# Patient Record
Sex: Female | Born: 1950 | Race: Black or African American | Hispanic: No | Marital: Single | Smoking: Never smoker
Health system: Southern US, Community
[De-identification: ages and names within clinical notes are randomized; demographics above are authoritative.]

## PROBLEM LIST (undated history)

## (undated) DIAGNOSIS — K219 Gastro-esophageal reflux disease without esophagitis: Secondary | ICD-10-CM

## (undated) DIAGNOSIS — I10 Essential (primary) hypertension: Secondary | ICD-10-CM

## (undated) HISTORY — PX: PARTIAL HYSTERECTOMY: SHX80

## (undated) HISTORY — PX: BUNIONECTOMY: SHX129

## (undated) HISTORY — PX: HAND TENDON SURGERY: SHX663

---

## 2000-03-17 ENCOUNTER — Encounter: Payer: Self-pay | Admitting: Emergency Medicine

## 2000-03-17 ENCOUNTER — Emergency Department (HOSPITAL_COMMUNITY): Admission: EM | Admit: 2000-03-17 | Discharge: 2000-03-17 | Payer: Self-pay | Admitting: Emergency Medicine

## 2016-10-16 ENCOUNTER — Ambulatory Visit (HOSPITAL_COMMUNITY)
Admission: EM | Admit: 2016-10-16 | Discharge: 2016-10-16 | Disposition: A | Discharge status: Home / Self Care | Attending: Emergency Medicine | Admitting: Emergency Medicine

## 2016-10-16 ENCOUNTER — Encounter (HOSPITAL_COMMUNITY): Payer: Self-pay | Admitting: Emergency Medicine

## 2016-10-16 DIAGNOSIS — G5793 Unspecified mononeuropathy of bilateral lower limbs: Secondary | ICD-10-CM | POA: Diagnosis not present

## 2016-10-16 DIAGNOSIS — M2140 Flat foot [pes planus] (acquired), unspecified foot: Secondary | ICD-10-CM

## 2016-10-16 HISTORY — DX: Essential (primary) hypertension: I10

## 2016-10-16 MED ORDER — GABAPENTIN 100 MG PO CAPS: 100.0000 mg | ORAL_CAPSULE | Freq: Every day | ORAL | 0 refills | Status: DC

## 2016-10-16 NOTE — Discharge Instructions (Signed)
Lower Extremity Neuropathy can come from: Diabetes Alcoholism Neurologic disorders from compression of spinal nerves in back or chronic demyelinating disorders Hypothyroidism Lymes disease Hepatitis C Nutritional or vitamin Defeciencies Vascular disease

## 2016-10-16 NOTE — ED Triage Notes (Signed)
The patient presented to the [facility] with a complaint of bilateral lower leg pain and numbness and tingling since November 2017. The patient stated that she has been to the [name] in [place] for the same complaint and "got no answers."

## 2016-10-16 NOTE — ED Provider Notes (Signed)
CSN: [id number]     Arrival date & time 10/16/16  1308 History   First MD Initiated Contact with Patient 10/16/16 1336     Chief Complaint  Patient presents with  . Leg Pain   (Consider location/radiation/quality/duration/timing/severity/associated sxs/prior Treatment) Patient is here with c/o numbness and tingling in her LE's.  She states she went to her PCP and c/o this and did blood work but has not heard back.  She does have hx of HTN.  She does drink ETOH daily.  She has arthritis and flat feet.  She states the numbness and discomfort have been present since Nov 2017.     The history is provided by the patient.  Leg Pain  Location:  Leg Time since incident:  3 months Injury: no   Leg location:  L leg, R leg, L lower leg and R lower leg Pain details:    Quality:  Tingling and burning   Radiates to:  Does not radiate   Severity:  Mild   Onset quality:  Sudden   Duration:  3 months   Timing:  Constant   Progression:  Unchanged Chronicity:  New Dislocation: no   Foreign body present:  No foreign bodies Tetanus status:  Unknown Prior injury to area:  No Relieved by:  None tried Worsened by:  Nothing Ineffective treatments:  None tried   Past Medical History:  Diagnosis Date  . Hypertension    Past Surgical History:  Procedure Laterality Date  . BUNIONECTOMY Bilateral   . HAND TENDON SURGERY Bilateral   . PARTIAL HYSTERECTOMY     History reviewed. No pertinent family history. Social History  Substance Use Topics  . Smoking status: Never Smoker  . Smokeless tobacco: Never Used  . Alcohol use Yes   OB History    No data available     Review of Systems  Constitutional: Negative.   HENT: Negative.   Eyes: Negative.   Respiratory: Negative.   Cardiovascular: Negative.   Gastrointestinal: Negative.   Endocrine: Negative.   Genitourinary: Negative.   Musculoskeletal: Negative.   Allergic/Immunologic: Negative.   Neurological: Positive for numbness.   Hematological: Negative.   Psychiatric/Behavioral: Negative.     Allergies  Patient has no known allergies.  Home Medications   Prior to Admission medications   Medication Sig Start Date End Date Taking? Authorizing Provider  amLODipine (NORVASC) 5 MG tablet Take 5 mg by mouth daily.   Yes Historical Provider, MD  lisinopril (PRINIVIL,ZESTRIL) 5 MG tablet Take 5 mg by mouth daily.   Yes Historical Provider, MD  gabapentin (NEURONTIN) 100 MG capsule Take 1 capsule (100 mg total) by mouth at bedtime. 10/16/16   Lysbeth Penner, FNP   Meds Ordered and Administered this Visit  Medications - No data to display  BP 151/91 (BP Location: Right Arm)   Pulse 96   Temp 98.1 F (36.7 C) (Oral)   Resp 18   SpO2 100%  No data found.   Physical Exam  Constitutional: She is oriented to person, place, and time. She appears well-developed and well-nourished.  HENT:  Head: Normocephalic and atraumatic.  Eyes: Conjunctivae and EOM are normal. Pupils are equal, round, and reactive to light.  Neck: Normal range of motion. Neck supple.  Cardiovascular: Normal rate, regular rhythm, normal heart sounds and intact distal pulses.   Pulmonary/Chest: Effort normal and breath sounds normal.  Musculoskeletal:  Bilateral halux valgus and pes plantus.  Neurological: She is alert and oriented to person, place, and  time.  Nursing note and vitals reviewed.   Urgent Care Course     Procedures (including critical care time)  Labs Review Labs Reviewed - No data to display  Imaging Review No results found.   Visual Acuity Review  Right Eye Distance:   Left Eye Distance:   Bilateral Distance:    Right Eye Near:   Left Eye Near:    Bilateral Near:         MDM   1. Neuropathy involving both lower extremities   2. Pes planus, unspecified laterality    Discussed with patient that this could be from metabolic problem, auto immune, vascular, orthopedic, Or idiopathic  Advised to cut  back on ETOH.  Take folic acid and Thiamine vitamins Daily.  Take B12 vitamins  Neurontin 100mg  po qhs #30  Please follow up with PCP.  May need bilateral NCS and referral to Neurology.      Lysbeth Penner, FNP 10/16/16 1436

## 2020-05-20 ENCOUNTER — Other Ambulatory Visit: Payer: Self-pay

## 2020-05-20 ENCOUNTER — Encounter (HOSPITAL_COMMUNITY): Payer: Self-pay

## 2020-05-20 ENCOUNTER — Ambulatory Visit (HOSPITAL_COMMUNITY)
Admission: EM | Admit: 2020-05-20 | Discharge: 2020-05-20 | Disposition: A | Discharge status: Home / Self Care | Payer: Medicare Other | Attending: Emergency Medicine | Admitting: Emergency Medicine

## 2020-05-20 DIAGNOSIS — R609 Edema, unspecified: Secondary | ICD-10-CM | POA: Insufficient documentation

## 2020-05-20 DIAGNOSIS — I10 Essential (primary) hypertension: Secondary | ICD-10-CM | POA: Insufficient documentation

## 2020-05-20 LAB — COMPREHENSIVE METABOLIC PANEL
ALT: 14 U/L (ref 0–44)
AST: 20 U/L (ref 15–41)
Albumin: 4.1 g/dL (ref 3.5–5.0)
Alkaline Phosphatase: 73 U/L (ref 38–126)
Anion gap: 10 (ref 5–15)
BUN: 10 mg/dL (ref 8–23)
CO2: 28 mmol/L (ref 22–32)
Calcium: 9.4 mg/dL (ref 8.9–10.3)
Chloride: 104 mmol/L (ref 98–111)
Creatinine, Ser: 1.03 mg/dL — ABNORMAL HIGH (ref 0.44–1.00)
GFR calc Af Amer: >60 mL/min (ref >60)
GFR calc non Af Amer: 56 mL/min — ABNORMAL LOW (ref >60)
Glucose, Bld: 113 mg/dL — ABNORMAL HIGH (ref 70–99)
Potassium: 3.5 mmol/L (ref 3.5–5.1)
Sodium: 142 mmol/L (ref 135–145)
Total Bilirubin: 0.6 mg/dL (ref 0.3–1.2)
Total Protein: 7.9 g/dL (ref 6.5–8.1)

## 2020-05-20 LAB — CBC
HCT: 37.2 % (ref 36.0–46.0)
Hemoglobin: 12.1 g/dL (ref 12.0–15.0)
MCH: 32.9 pg (ref 26.0–34.0)
MCHC: 32.5 g/dL (ref 30.0–36.0)
MCV: 101.1 fL — ABNORMAL HIGH (ref 80.0–100.0)
Platelets: 265 10*3/uL (ref 150–400)
RBC: 3.68 MIL/uL — ABNORMAL LOW (ref 3.87–5.11)
RDW: 13.4 % (ref 11.5–15.5)
WBC: 5.7 10*3/uL (ref 4.0–10.5)
nRBC: 0 % (ref 0.0–0.2)

## 2020-05-20 MED ORDER — AMLODIPINE BESYLATE 5 MG PO TABS: 5.0000 mg | ORAL_TABLET | Freq: Every day | ORAL | 0 refills | Status: DC

## 2020-05-20 NOTE — Discharge Instructions (Addendum)
Restart you blood pressure medication: amlodipine.  Schedule a follow up visit with your primary care provider next week.    I will call you with your lab work results this afternoon.  Depending on the results, I may prescribe a fluid medication for 3 days.   Follow the low salt diet attached.   Go to the Emergency Department if you have shortness of breath or other concerning symptoms.   Your blood pressure is elevated today at 167/84.  Please have this rechecked by your primary care provider in 1-2 weeks.

## 2020-05-20 NOTE — ED Provider Notes (Signed)
[facility]    CSN: [id number] Arrival date & time: 05/20/20  1002      History   Chief Complaint Chief Complaint  Patient presents with  . Leg Swelling    HPI Brenda Stewart is a 69 y.o. female.   Patient presents with bilateral lower extremity edema x3 to 4 days.  She denies shortness of breath, chest pain, focal weakness, dizziness, palpitations, or other symptoms patient states she has not taken her blood pressure medicine for approximately 9 months because she does not have a refill and has not been able to see her PCP at the [name]. no treatments attempted at home.  The history is provided by the patient.    Past Medical History:  Diagnosis Date  . Hypertension     There are no problems to display for this patient.   Past Surgical History:  Procedure Laterality Date  . BUNIONECTOMY Bilateral   . HAND TENDON SURGERY Bilateral   . PARTIAL HYSTERECTOMY      OB History   No obstetric history on file.      Home Medications    Prior to Admission medications   Medication Sig Start Date End Date Taking? Authorizing Provider  amLODipine (NORVASC) 5 MG tablet Take 1 tablet (5 mg total) by mouth daily. 05/20/20   Sharion Balloon, NP  gabapentin (NEURONTIN) 100 MG capsule Take 1 capsule (100 mg total) by mouth at bedtime. 10/16/16   Lysbeth Penner, FNP  lisinopril (PRINIVIL,ZESTRIL) 5 MG tablet Take 5 mg by mouth daily.    [provider]    Family History History reviewed. No pertinent family history.  Social History Social History   Tobacco Use  . Smoking status: Never Smoker  . Smokeless tobacco: Never Used  Substance Use Topics  . Alcohol use: Yes  . Drug use: No     Allergies   Patient has no known allergies.   Review of Systems Review of Systems  Constitutional: Negative for chills and fever.  HENT: Negative for ear pain and sore throat.   Eyes: Negative for pain and visual disturbance.  Respiratory: Negative for cough and  shortness of breath.   Cardiovascular: Positive for leg swelling. Negative for chest pain and palpitations.  Gastrointestinal: Negative for abdominal pain, diarrhea and vomiting.  Genitourinary: Negative for dysuria and hematuria.  Musculoskeletal: Negative for arthralgias and back pain.  Skin: Negative for color change and rash.  Neurological: Negative for dizziness, seizures, syncope, facial asymmetry, speech difficulty, weakness, numbness and headaches.  All other systems reviewed and are negative.    Physical Exam Triage Vital Signs ED Triage Vitals  Enc Vitals Group     BP      Pulse      Resp      Temp      Temp src      SpO2      Weight      Height      Head Circumference      Peak Flow      Pain Score      Pain Loc      Pain Edu?      Excl. in [place]?    No data found.  Updated Vital Signs BP (!) 167/84   Pulse 92   Temp 99.1 F (37.3 C)   Resp 16   SpO2 98%   Visual Acuity Right Eye Distance:   Left Eye Distance:   Bilateral Distance:    Right  Eye Near:   Left Eye Near:    Bilateral Near:     Physical Exam Vitals and nursing note reviewed.  Constitutional:      General: She is not in acute distress.    Appearance: She is well-developed. She is not ill-appearing.  HENT:     Head: Normocephalic and atraumatic.     Mouth/Throat:     Mouth: Mucous membranes are moist.  Eyes:     Conjunctiva/sclera: Conjunctivae normal.  Cardiovascular:     Rate and Rhythm: Normal rate and regular rhythm.     Heart sounds: Normal heart sounds. No murmur heard.   Pulmonary:     Effort: Pulmonary effort is normal. No respiratory distress.     Breath sounds: Normal breath sounds. No wheezing, rhonchi or rales.  Abdominal:     Palpations: Abdomen is soft.     Tenderness: There is no abdominal tenderness. There is no guarding or rebound.  Musculoskeletal:     Cervical back: Neck supple.     Right lower leg: Edema present.     Left lower leg: Edema present.  Skin:     General: Skin is warm and dry.  Neurological:     General: No focal deficit present.     Mental Status: She is alert and oriented to person, place, and time.     Gait: Gait normal.  Psychiatric:        Mood and Affect: Mood normal.        Behavior: Behavior normal.      UC Treatments / Results  Labs (all labs ordered are listed, but only abnormal results are displayed) Labs Reviewed  CBC - Abnormal; Notable for the following components:      Result Value   RBC 3.68 (*)    MCV 101.1 (*)    All other components within normal limits  COMPREHENSIVE METABOLIC PANEL - Abnormal; Notable for the following components:   Glucose, Bld 113 (*)    Creatinine, Ser 1.03 (*)    GFR calc non Af Amer 56 (*)    All other components within normal limits    EKG   Radiology No results found.  Procedures Procedures (including critical care time)  Medications Ordered in UC Medications - No data to display  Initial Impression / Assessment and Plan / UC Course  I have reviewed the triage vital signs and the nursing notes.  Pertinent labs & imaging results that were available during my care of the patient were reviewed by me and considered in my medical decision making (see chart for details).    Peripheral edema.  Elevated blood pressure with known hypertension.   CBC shows no elevated white count.  CMP shows slightly elevated creatinine and GFR greater than 60.  Restarting amlodipine.  Instructed patient to schedule follow-up with her PCP next week.  Low-sodium diet recommended; education provided.  Instructed patient to go to the ED if she has shortness of breath or other concerning symptoms.  Discussed that her blood pressure is elevated today and needs to be rechecked by her PCP.  Patient agrees to plan of care.  Final Clinical Impressions(s) / UC Diagnoses   Final diagnoses:  Peripheral edema  Elevated blood pressure reading in office with diagnosis of hypertension     Discharge  Instructions     Restart you blood pressure medication: amlodipine.  Schedule a follow up visit with your primary care provider next week.    I will call you with your  lab work results this afternoon.  Depending on the results, I may prescribe a fluid medication for 3 days.   Follow the low salt diet attached.   Go to the Emergency Department if you have shortness of breath or other concerning symptoms.   Your blood pressure is elevated today at 167/84.  Please have this rechecked by your primary care provider in 1-2 weeks.         ED Prescriptions    Medication Sig Dispense Auth. Provider   amLODipine (NORVASC) 5 MG tablet Take 1 tablet (5 mg total) by mouth daily. 30 tablet Sharion Balloon, NP     PDMP not reviewed this encounter.   Sharion Balloon, NP 05/20/20 1159

## 2020-05-20 NOTE — ED Triage Notes (Signed)
Patient reports bilateral lower extremity edema x3-4 days. Also complains of 10/10 throbbing pain in her right great toe.

## 2021-01-02 ENCOUNTER — Other Ambulatory Visit (HOSPITAL_COMMUNITY): Payer: Self-pay

## 2021-02-27 ENCOUNTER — Encounter (HOSPITAL_COMMUNITY): Payer: Self-pay | Admitting: Emergency Medicine

## 2021-02-27 ENCOUNTER — Other Ambulatory Visit: Payer: Self-pay

## 2021-02-27 ENCOUNTER — Ambulatory Visit (HOSPITAL_COMMUNITY)
Admission: EM | Admit: 2021-02-27 | Discharge: 2021-02-27 | Disposition: A | Discharge status: Home / Self Care | Payer: Medicare Other

## 2021-02-27 DIAGNOSIS — R1084 Generalized abdominal pain: Secondary | ICD-10-CM

## 2021-02-27 LAB — POCT URINALYSIS DIPSTICK, ED / UC
Glucose, UA: NEGATIVE mg/dL
Ketones, ur: 15 mg/dL — AB
Leukocytes,Ua: NEGATIVE
Nitrite: NEGATIVE
Protein, ur: NEGATIVE mg/dL
Specific Gravity, Urine: >=1.03 (ref 1.005–1.030)
Urobilinogen, UA: 1 mg/dL (ref 0.0–1.0)
pH: 5.5 (ref 5.0–8.0)

## 2021-02-27 MED ORDER — MINERAL OIL RE ENEM: 1.0000 | ENEMA | Freq: Once | RECTAL | 0 refills | Status: AC

## 2021-02-27 MED ORDER — POLYETHYLENE GLYCOL 3350 17 GM/SCOOP PO POWD: ORAL | 0 refills | Status: DC

## 2021-02-27 NOTE — Discharge Instructions (Addendum)
Urine did not show any infection. Mineral oil suppository today. Start miralax as directed. Keep hydrated, urine should be clear to pale yellow in color. If worsening pain, nausea/vomiting, fever/chills, not passing gas, go to the emergency department for further evaluation.

## 2021-02-27 NOTE — ED Triage Notes (Signed)
Right lower abdominal pain radiating across abdomen to the left.  Pain has been intermittent since the end of April.  Last BM was 3-4 days ago and described stool as "little raisins".  Patient has not taken any stool softener.  Denies urinary symptoms

## 2021-02-27 NOTE — ED Provider Notes (Signed)
[facility]    CSN: [id number] Arrival date & time: 02/27/21  1501      History   Chief Complaint Chief Complaint  Patient presents with  . Abdominal Pain    HPI Brenda Stewart is a 70 y.o. female.   70 year old female comes in for 1 month of intermittent abdominal pain. States pain is to bilateral lower abdomen, and feels "sore". It does not occur every day, and does not have obvious aggravating or alleviating factor. Denies nausea, vomiting, diarrhea. Denies fever, chills, body aches. Denies urinary changes. Has been having constipation with small hard stools, last BM 3-4 days ago. Denies melena, hematochezia. Continues to pass flatus. Unsure last colonoscopy. States was given cologuard to do last year/this year, but due to transportation, has not brought this in to PCP. S/p hysterectomy     Past Medical History:  Diagnosis Date  . Hypertension     There are no problems to display for this patient.   Past Surgical History:  Procedure Laterality Date  . BUNIONECTOMY Bilateral   . HAND TENDON SURGERY Bilateral   . PARTIAL HYSTERECTOMY      OB History   No obstetric history on file.      Home Medications    Prior to Admission medications   Medication Sig Start Date End Date Taking? Authorizing Provider  amLODipine (NORVASC) 5 MG tablet Take 1 tablet (5 mg total) by mouth daily. 05/20/20  Yes Sharion Balloon, NP  aspirin EC 81 MG tablet Take 81 mg by mouth daily. Swallow whole.   Yes [provider]  lisinopril (PRINIVIL,ZESTRIL) 5 MG tablet Take 5 mg by mouth daily.   Yes [provider]  mineral oil enema Place 133 mLs (1 enema total) rectally once for 1 dose. 02/27/21 02/27/21 Yes Adina Puzzo V, PA-C  polyethylene glycol powder (GLYCOLAX/MIRALAX) 17 GM/SCOOP powder One scoop (17g) with 8 oz of water daily for the next 7 days 02/27/21  Yes Monesha Monreal V, PA-C  gabapentin (NEURONTIN) 100 MG capsule Take 1 capsule (100 mg total) by mouth at  bedtime. Patient not taking: Reported on 02/27/2021 10/16/16   Lysbeth Penner, FNP    Family History History reviewed. No pertinent family history.  Social History Social History   Tobacco Use  . Smoking status: Never Smoker  . Smokeless tobacco: Never Used  Vaping Use  . Vaping Use: Never used  Substance Use Topics  . Alcohol use: Yes  . Drug use: No     Allergies   Patient has no known allergies.   Review of Systems Review of Systems  Reason unable to perform ROS: See HPI as above.     Physical Exam Triage Vital Signs ED Triage Vitals [02/27/21 1557]  Enc Vitals Group     BP      Pulse      Resp      Temp      Temp src      SpO2      Weight      Height      Head Circumference      Peak Flow      Pain Score 9     Pain Loc      Pain Edu?      Excl. in [place]?    No data found.  Updated Vital Signs BP 140/78 (BP Location: Right Arm)   Pulse 89   Temp 98 F (36.7 C) (Oral)   Resp  20   SpO2 97%   Physical Exam Constitutional:      General: She is not in acute distress.    Appearance: She is well-developed. She is not ill-appearing, toxic-appearing or diaphoretic.  HENT:     Head: Normocephalic and atraumatic.  Eyes:     Conjunctiva/sclera: Conjunctivae normal.     Pupils: Pupils are equal, round, and reactive to light.  Cardiovascular:     Rate and Rhythm: Normal rate and regular rhythm.  Pulmonary:     Effort: Pulmonary effort is normal. No respiratory distress.     Comments: LCTAB Abdominal:     General: Bowel sounds are normal.     Palpations: Abdomen is soft. There is no mass.     Tenderness: There is abdominal tenderness in the left upper quadrant and left lower quadrant. There is no right CVA tenderness, left CVA tenderness, guarding or rebound.  Musculoskeletal:     Cervical back: Normal range of motion and neck supple.  Skin:    General: Skin is warm and dry.  Neurological:     Mental Status: She is alert and oriented to person,  place, and time.  Psychiatric:        Behavior: Behavior normal.        Judgment: Judgment normal.      UC Treatments / Results  Labs (all labs ordered are listed, but only abnormal results are displayed) Labs Reviewed  POCT URINALYSIS DIPSTICK, ED / UC - Abnormal; Notable for the following components:      Result Value   Bilirubin Urine SMALL (*)    Ketones, ur 15 (*)    Hgb urine dipstick TRACE (*)    All other components within normal limits    EKG   Radiology No results found.  Procedures Procedures (including critical care time)  Medications Ordered in UC Medications - No data to display  Initial Impression / Assessment and Plan / UC Course  I have reviewed the triage vital signs and the nursing notes.  Pertinent labs & imaging results that were available during my care of the patient were reviewed by me and considered in my medical decision making (see chart for details).    Urine dipstick negative for leuks, nitrites. No urinary changes. Abdomen soft, +BS, LUQ/LLQ pain without guarding or rebound. No obvious masses felt. Will trial medicines for constipation first. Push fluids. Discussed low threshold for ED visit. Return precautions given. Patient expresses understanding and agrees to plan.  Final Clinical Impressions(s) / UC Diagnoses   Final diagnoses:  Generalized abdominal pain    ED Prescriptions    Medication Sig Dispense Auth. Provider   polyethylene glycol powder (GLYCOLAX/MIRALAX) 17 GM/SCOOP powder One scoop (17g) with 8 oz of water daily for the next 7 days 255 g Jessel Gettinger V, PA-C   mineral oil enema Place 133 mLs (1 enema total) rectally once for 1 dose. 133 mL Ok Edwards, PA-C     PDMP not reviewed this encounter.   Ok Edwards, PA-C 02/27/21 1635

## 2021-04-21 ENCOUNTER — Inpatient Hospital Stay (HOSPITAL_COMMUNITY): Payer: Medicare Other

## 2021-04-21 ENCOUNTER — Other Ambulatory Visit: Payer: Self-pay

## 2021-04-21 ENCOUNTER — Inpatient Hospital Stay (HOSPITAL_COMMUNITY)
Admission: EM | Admit: 2021-04-21 | Discharge: 2021-05-09 | DRG: 356 | Disposition: A | Discharge status: Home Health | Payer: Medicare Other | Attending: Family Medicine | Admitting: Family Medicine

## 2021-04-21 ENCOUNTER — Emergency Department (HOSPITAL_COMMUNITY): Payer: Medicare Other

## 2021-04-21 ENCOUNTER — Encounter (HOSPITAL_COMMUNITY): Payer: Self-pay | Admitting: Emergency Medicine

## 2021-04-21 DIAGNOSIS — K56600 Partial intestinal obstruction, unspecified as to cause: Secondary | ICD-10-CM | POA: Diagnosis present

## 2021-04-21 DIAGNOSIS — C482 Malignant neoplasm of peritoneum, unspecified: Secondary | ICD-10-CM

## 2021-04-21 DIAGNOSIS — C786 Secondary malignant neoplasm of retroperitoneum and peritoneum: Secondary | ICD-10-CM | POA: Diagnosis present

## 2021-04-21 DIAGNOSIS — R188 Other ascites: Secondary | ICD-10-CM | POA: Diagnosis not present

## 2021-04-21 DIAGNOSIS — Z6829 Body mass index (BMI) 29.0-29.9, adult: Secondary | ICD-10-CM

## 2021-04-21 DIAGNOSIS — D509 Iron deficiency anemia, unspecified: Secondary | ICD-10-CM | POA: Diagnosis present

## 2021-04-21 DIAGNOSIS — Z809 Family history of malignant neoplasm, unspecified: Secondary | ICD-10-CM

## 2021-04-21 DIAGNOSIS — C269 Malignant neoplasm of ill-defined sites within the digestive system: Secondary | ICD-10-CM | POA: Diagnosis present

## 2021-04-21 DIAGNOSIS — R9431 Abnormal electrocardiogram [ECG] [EKG]: Secondary | ICD-10-CM

## 2021-04-21 DIAGNOSIS — I10 Essential (primary) hypertension: Secondary | ICD-10-CM | POA: Diagnosis present

## 2021-04-21 DIAGNOSIS — I809 Phlebitis and thrombophlebitis of unspecified site: Secondary | ICD-10-CM | POA: Diagnosis present

## 2021-04-21 DIAGNOSIS — D630 Anemia in neoplastic disease: Secondary | ICD-10-CM | POA: Diagnosis present

## 2021-04-21 DIAGNOSIS — R2230 Localized swelling, mass and lump, unspecified upper limb: Secondary | ICD-10-CM

## 2021-04-21 DIAGNOSIS — I2699 Other pulmonary embolism without acute cor pulmonale: Secondary | ICD-10-CM | POA: Diagnosis not present

## 2021-04-21 DIAGNOSIS — R180 Malignant ascites: Secondary | ICD-10-CM | POA: Diagnosis present

## 2021-04-21 DIAGNOSIS — Z20822 Contact with and (suspected) exposure to covid-19: Secondary | ICD-10-CM | POA: Diagnosis present

## 2021-04-21 DIAGNOSIS — E43 Unspecified severe protein-calorie malnutrition: Secondary | ICD-10-CM | POA: Insufficient documentation

## 2021-04-21 DIAGNOSIS — Z7982 Long term (current) use of aspirin: Secondary | ICD-10-CM

## 2021-04-21 DIAGNOSIS — I2609 Other pulmonary embolism with acute cor pulmonale: Secondary | ICD-10-CM | POA: Diagnosis not present

## 2021-04-21 DIAGNOSIS — D539 Nutritional anemia, unspecified: Secondary | ICD-10-CM | POA: Diagnosis present

## 2021-04-21 DIAGNOSIS — D72829 Elevated white blood cell count, unspecified: Secondary | ICD-10-CM | POA: Diagnosis present

## 2021-04-21 DIAGNOSIS — R112 Nausea with vomiting, unspecified: Secondary | ICD-10-CM

## 2021-04-21 DIAGNOSIS — R1114 Bilious vomiting: Secondary | ICD-10-CM

## 2021-04-21 DIAGNOSIS — E871 Hypo-osmolality and hyponatremia: Secondary | ICD-10-CM

## 2021-04-21 DIAGNOSIS — N179 Acute kidney failure, unspecified: Secondary | ICD-10-CM

## 2021-04-21 DIAGNOSIS — C7989 Secondary malignant neoplasm of other specified sites: Secondary | ICD-10-CM | POA: Diagnosis present

## 2021-04-21 DIAGNOSIS — D75839 Thrombocytosis, unspecified: Secondary | ICD-10-CM | POA: Diagnosis present

## 2021-04-21 DIAGNOSIS — R634 Abnormal weight loss: Secondary | ICD-10-CM

## 2021-04-21 DIAGNOSIS — E538 Deficiency of other specified B group vitamins: Secondary | ICD-10-CM | POA: Diagnosis present

## 2021-04-21 DIAGNOSIS — E860 Dehydration: Secondary | ICD-10-CM | POA: Diagnosis present

## 2021-04-21 DIAGNOSIS — C800 Disseminated malignant neoplasm, unspecified: Secondary | ICD-10-CM | POA: Diagnosis present

## 2021-04-21 DIAGNOSIS — Z79899 Other long term (current) drug therapy: Secondary | ICD-10-CM

## 2021-04-21 DIAGNOSIS — D490 Neoplasm of unspecified behavior of digestive system: Secondary | ICD-10-CM | POA: Diagnosis not present

## 2021-04-21 DIAGNOSIS — R9389 Abnormal findings on diagnostic imaging of other specified body structures: Secondary | ICD-10-CM

## 2021-04-21 DIAGNOSIS — Z0189 Encounter for other specified special examinations: Secondary | ICD-10-CM

## 2021-04-21 DIAGNOSIS — I700 Atherosclerosis of aorta: Secondary | ICD-10-CM | POA: Diagnosis present

## 2021-04-21 DIAGNOSIS — K56609 Unspecified intestinal obstruction, unspecified as to partial versus complete obstruction: Secondary | ICD-10-CM | POA: Diagnosis not present

## 2021-04-21 DIAGNOSIS — F1010 Alcohol abuse, uncomplicated: Secondary | ICD-10-CM | POA: Diagnosis present

## 2021-04-21 DIAGNOSIS — K219 Gastro-esophageal reflux disease without esophagitis: Secondary | ICD-10-CM | POA: Diagnosis present

## 2021-04-21 DIAGNOSIS — R000 Tachycardia, unspecified: Secondary | ICD-10-CM | POA: Diagnosis present

## 2021-04-21 DIAGNOSIS — E876 Hypokalemia: Secondary | ICD-10-CM

## 2021-04-21 DIAGNOSIS — Z90711 Acquired absence of uterus with remaining cervical stump: Secondary | ICD-10-CM

## 2021-04-21 DIAGNOSIS — K56699 Other intestinal obstruction unspecified as to partial versus complete obstruction: Secondary | ICD-10-CM | POA: Diagnosis not present

## 2021-04-21 HISTORY — DX: Gastro-esophageal reflux disease without esophagitis: K21.9

## 2021-04-21 LAB — CBC WITH DIFFERENTIAL/PLATELET
Abs Immature Granulocytes: 0.33 10*3/uL — ABNORMAL HIGH (ref 0.00–0.07)
Basophils Absolute: 0 10*3/uL (ref 0.0–0.1)
Basophils Relative: 0 %
Eosinophils Absolute: 0.1 10*3/uL (ref 0.0–0.5)
Eosinophils Relative: 0 %
HCT: 38.6 % (ref 36.0–46.0)
Hemoglobin: 12.8 g/dL (ref 12.0–15.0)
Immature Granulocytes: 3 %
Lymphocytes Relative: 12 %
Lymphs Abs: 1.6 10*3/uL (ref 0.7–4.0)
MCH: 30.8 pg (ref 26.0–34.0)
MCHC: 33.2 g/dL (ref 30.0–36.0)
MCV: 92.8 fL (ref 80.0–100.0)
Monocytes Absolute: 0.9 10*3/uL (ref 0.1–1.0)
Monocytes Relative: 7 %
Neutro Abs: 10.1 10*3/uL — ABNORMAL HIGH (ref 1.7–7.7)
Neutrophils Relative %: 78 %
Platelets: 535 10*3/uL — ABNORMAL HIGH (ref 150–400)
RBC: 4.16 MIL/uL (ref 3.87–5.11)
RDW: 13.1 % (ref 11.5–15.5)
WBC: 13 10*3/uL — ABNORMAL HIGH (ref 4.0–10.5)
nRBC: 0 % (ref 0.0–0.2)

## 2021-04-21 LAB — URINALYSIS, ROUTINE W REFLEX MICROSCOPIC
Bacteria, UA: NONE SEEN
Glucose, UA: NEGATIVE mg/dL
Hgb urine dipstick: NEGATIVE
Ketones, ur: 5 mg/dL — AB
Leukocytes,Ua: NEGATIVE
Nitrite: NEGATIVE
Protein, ur: 30 mg/dL — AB
Specific Gravity, Urine: 1.024 (ref 1.005–1.030)
pH: 5 (ref 5.0–8.0)

## 2021-04-21 LAB — COMPREHENSIVE METABOLIC PANEL
ALT: 10 U/L (ref 0–44)
AST: 12 U/L — ABNORMAL LOW (ref 15–41)
Albumin: 3.5 g/dL (ref 3.5–5.0)
Alkaline Phosphatase: 77 U/L (ref 38–126)
Anion gap: 15 (ref 5–15)
BUN: 17 mg/dL (ref 8–23)
CO2: 23 mmol/L (ref 22–32)
Calcium: 9.5 mg/dL (ref 8.9–10.3)
Chloride: 94 mmol/L — ABNORMAL LOW (ref 98–111)
Creatinine, Ser: 2.57 mg/dL — ABNORMAL HIGH (ref 0.44–1.00)
GFR, Estimated: 20 mL/min — ABNORMAL LOW (ref >60)
Glucose, Bld: 110 mg/dL — ABNORMAL HIGH (ref 70–99)
Potassium: 3.8 mmol/L (ref 3.5–5.1)
Sodium: 132 mmol/L — ABNORMAL LOW (ref 135–145)
Total Bilirubin: 0.7 mg/dL (ref 0.3–1.2)
Total Protein: 8.1 g/dL (ref 6.5–8.1)

## 2021-04-21 LAB — LIPASE, BLOOD: Lipase: 28 U/L (ref 11–51)

## 2021-04-21 LAB — RESP PANEL BY RT-PCR (FLU A&B, COVID) ARPGX2
Influenza A by PCR: NEGATIVE
Influenza B by PCR: NEGATIVE
SARS Coronavirus 2 by RT PCR: NEGATIVE

## 2021-04-21 LAB — MAGNESIUM: Magnesium: 1.8 mg/dL (ref 1.7–2.4)

## 2021-04-21 MED ORDER — HEPARIN SODIUM (PORCINE) 5000 UNIT/ML IJ SOLN
5000.0000 [IU] | Freq: Three times a day (TID) | INTRAMUSCULAR | Status: DC
  Administered 2021-04-22 – 2021-04-27 (×9): 5000 [IU] via SUBCUTANEOUS
  Filled 2021-04-21 (×12): qty 1

## 2021-04-21 MED ORDER — TRIMETHOBENZAMIDE HCL 100 MG/ML IM SOLN
200.0000 mg | Freq: Four times a day (QID) | INTRAMUSCULAR | Status: DC | PRN
  Administered 2021-04-22 – 2021-04-27 (×3): 200 mg via INTRAMUSCULAR
  Filled 2021-04-21 (×6): qty 2

## 2021-04-21 MED ORDER — LACTATED RINGERS IV SOLN: INTRAVENOUS | Status: DC

## 2021-04-21 MED ORDER — DIATRIZOATE MEGLUMINE & SODIUM 66-10 % PO SOLN
90.0000 mL | Freq: Once | ORAL | Status: AC
  Administered 2021-04-22: 90 mL via NASOGASTRIC
  Filled 2021-04-21: qty 90

## 2021-04-21 MED ORDER — SODIUM CHLORIDE 0.9 % IV BOLUS
1000.0000 mL | Freq: Once | INTRAVENOUS | Status: AC
  Administered 2021-04-21: 1000 mL via INTRAVENOUS

## 2021-04-21 MED ORDER — ACETAMINOPHEN 650 MG RE SUPP: 650.0000 mg | Freq: Four times a day (QID) | RECTAL | Status: DC | PRN

## 2021-04-21 MED ORDER — ONDANSETRON HCL 4 MG/2ML IJ SOLN
4.0000 mg | Freq: Once | INTRAMUSCULAR | Status: AC
  Administered 2021-04-21: 4 mg via INTRAVENOUS
  Filled 2021-04-21: qty 2

## 2021-04-21 MED ORDER — HYDROMORPHONE HCL 1 MG/ML IJ SOLN
1.0000 mg | INTRAMUSCULAR | Status: DC | PRN
  Administered 2021-04-22 – 2021-04-30 (×2): 1 mg via INTRAVENOUS
  Filled 2021-04-21 (×2): qty 1

## 2021-04-21 MED ORDER — ACETAMINOPHEN 325 MG PO TABS: 650.0000 mg | ORAL_TABLET | Freq: Four times a day (QID) | ORAL | Status: DC | PRN

## 2021-04-21 NOTE — ED Triage Notes (Signed)
Pt reports intermittent lower abd pain since April.  She has been seen at [facility] for same and has colonoscopy and CT scheduled in August.  Reports nausea and vomiting x 2 days.  Chronic issues with constipation.

## 2021-04-21 NOTE — Consult Note (Signed)
Reason for Consult: SBO, carcinomatosis Referring Physician: Dr. [name] is an 70 y.o. female.  HPI: Patient is a 70 year old female, she comes in secondary to nausea vomiting abdominal pain.  Patient states her abdominal pain is been going on over the last 3 months.  She states that she has had pain started in the right lower quadrant and then transitioned to the suprapubic and left lower quadrant areas.  Patient states that since October 2021 she is lost approximately 23 pounds.  States this is unintentional weight loss.  Patient did state that she had a previous colonoscopy approximately 8 years ago which was normal.  Patient states that she came to the ER tonight secondary to increased nausea vomiting.  She does state that she is having bowel movements however secondary to chronic constipation she does take docusate.  She states that these usually resolves her constipation and causes diarrhea.  Patient does state that her brother and sister both had cancer however she is unsure which type.  Upon evaluation the ER she underwent CT scan.  CT scan was significant for ascites, questionable transition point in right lower quadrant area, and signs consistent with carcinomatosis.  I did review the CT scan personally. Patient also with a leukocytosis of 13.0.  Patient did have a albumin of 3.5.  Patient's creatinine is elevated at 2.57.  General surgery was consulted for further evaluation.  Past Medical History:  Diagnosis Date   Acid reflux    Hypertension     Past Surgical History:  Procedure Laterality Date   BUNIONECTOMY Bilateral    HAND TENDON SURGERY Bilateral    PARTIAL HYSTERECTOMY      History reviewed. No pertinent family history.  Social History:  reports that she has never smoked. She has never used smokeless tobacco. She reports current alcohol use. She reports that she does not use drugs.  Allergies: No Known Allergies  Medications: I have reviewed the  patient's current medications.  Results for orders placed or performed during the hospital encounter of 04/21/21 (from the past 48 hour(s))  Urinalysis, Routine w reflex microscopic Urine, Clean Catch     Status: Abnormal   Collection Time: 04/21/21  2:07 PM  Result Value Ref Range   Color, Urine AMBER (A) YELLOW    Comment: BIOCHEMICALS MAY BE AFFECTED BY COLOR   APPearance HAZY (A) CLEAR   Specific Gravity, Urine 1.024 1.005 - 1.030   pH 5.0 5.0 - 8.0   Glucose, UA NEGATIVE NEGATIVE mg/dL   Hgb urine dipstick NEGATIVE NEGATIVE   Bilirubin Urine SMALL (A) NEGATIVE   Ketones, ur 5 (A) NEGATIVE mg/dL   Protein, ur 30 (A) NEGATIVE mg/dL   Nitrite NEGATIVE NEGATIVE   Leukocytes,Ua NEGATIVE NEGATIVE   RBC / HPF 0-5 0 - 5 RBC/hpf   WBC, UA 6-10 0 - 5 WBC/hpf   Bacteria, UA NONE SEEN NONE SEEN   Squamous Epithelial / LPF 6-10 0 - 5   Hyaline Casts, UA PRESENT     Comment: Performed at [facility], [address], [place], [place] [address]  CBC with Differential     Status: Abnormal   Collection Time: 04/21/21  2:13 PM  Result Value Ref Range   WBC 13.0 (H) 4.0 - 10.5 K/uL   RBC 4.16 3.87 - 5.11 MIL/uL   Hemoglobin 12.8 12.0 - 15.0 g/dL   HCT 38.6 36.0 - 46.0 %   MCV 92.8 80.0 - 100.0 fL   MCH 30.8  26.0 - 34.0 pg   MCHC 33.2 30.0 - 36.0 g/dL   RDW 13.1 11.5 - 15.5 %   Platelets 535 (H) 150 - 400 K/uL   nRBC 0.0 0.0 - 0.2 %   Neutrophils Relative % 78 %   Neutro Abs 10.1 (H) 1.7 - 7.7 K/uL   Lymphocytes Relative 12 %   Lymphs Abs 1.6 0.7 - 4.0 K/uL   Monocytes Relative 7 %   Monocytes Absolute 0.9 0.1 - 1.0 K/uL   Eosinophils Relative 0 %   Eosinophils Absolute 0.1 0.0 - 0.5 K/uL   Basophils Relative 0 %   Basophils Absolute 0.0 0.0 - 0.1 K/uL   Immature Granulocytes 3 %   Abs Immature Granulocytes 0.33 (H) 0.00 - 0.07 K/uL    Comment: Performed at [facility] [address], [place], [place]  Comprehensive metabolic panel     Status: Abnormal    Collection Time: 04/21/21  2:13 PM  Result Value Ref Range   Sodium 132 (L) 135 - 145 mmol/L   Potassium 3.8 3.5 - 5.1 mmol/L   Chloride 94 (L) 98 - 111 mmol/L   CO2 23 22 - 32 mmol/L   Glucose, Bld 110 (H) 70 - 99 mg/dL    Comment: Glucose reference range applies only to samples taken after fasting for at least 8 hours.   BUN 17 8 - 23 mg/dL   Creatinine, Ser 2.57 (H) 0.44 - 1.00 mg/dL   Calcium 9.5 8.9 - 10.3 mg/dL   Total Protein 8.1 6.5 - 8.1 g/dL   Albumin 3.5 3.5 - 5.0 g/dL   AST 12 (L) 15 - 41 U/L   ALT 10 0 - 44 U/L   Alkaline Phosphatase 77 38 - 126 U/L   Total Bilirubin 0.7 0.3 - 1.2 mg/dL   GFR, Estimated 20 (L) >60 mL/min    Comment: (NOTE) Calculated using the CKD-EPI Creatinine Equation (2021)    Anion gap 15 5 - 15    Comment: Performed at [facility] [address], [place], [place] [address]  Lipase, blood     Status: None   Collection Time: 04/21/21  2:13 PM  Result Value Ref Range   Lipase 28 11 - 51 U/L    Comment: Performed at [facility] [address] [place], [place] [address]  Magnesium     Status: None   Collection Time: 04/21/21  2:13 PM  Result Value Ref Range   Magnesium 1.8 1.7 - 2.4 mg/dL    Comment: Performed at [facility] [address] [place], [place] [address]    CT ABDOMEN PELVIS WO CONTRAST  Result Date: 04/21/2021 CLINICAL DATA:  Nausea and vomiting for 2 days. Lower abdominal pain for approximately 3 months. Chronic constipation. EXAM: CT ABDOMEN AND PELVIS WITHOUT CONTRAST TECHNIQUE: Multidetector CT imaging of the abdomen and pelvis was performed following the standard protocol without IV contrast. COMPARISON:  None. FINDINGS: Lower chest: No acute findings. Hepatobiliary: No mass visualized on this unenhanced exam. Gallbladder is unremarkable. No evidence of biliary ductal dilatation. Pancreas: No mass or inflammatory process visualized on this unenhanced exam. Spleen:  Within normal limits in size.  Adrenals/Urinary tract: No evidence of urolithiasis or hydronephrosis. Unremarkable unopacified urinary bladder. Stomach/Bowel: Small hiatal hernia is seen containing ascites. Moderate diffuse small bowel dilatation is seen, with suspected transition point in the right lower quadrant, suspicious for distal small bowel obstruction. Vascular/Lymphatic: No pathologically enlarged lymph nodes  identified. No evidence of abdominal aortic aneurysm. Aortic atherosclerotic calcification noted. Reproductive: Prior hysterectomy suspected. Moderate ascites is seen. Omental caking and diffuse mesenteric soft tissue stranding is seen. There is also probable peritoneal nodularity along the left pelvic sidewall. These findings are highly suspicious for peritoneal carcinomatosis. Other:  None. Musculoskeletal:  No suspicious bone lesions identified. 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 Suspected distal small bowel obstruction, with probable transition point in right lower quadrant. Aortic Atherosclerosis (ICD10-I70.0). Electronically Signed   By: Marlaine Hind M.D.   On: 04/21/2021 18:36    Review of Systems  Constitutional:  Positive for unexpected weight change. Negative for chills and fever.  HENT:  Negative for ear discharge, hearing loss and sore throat.   Eyes:  Negative for discharge.  Respiratory:  Negative for cough and shortness of breath.   Cardiovascular:  Negative for chest pain and leg swelling.  Gastrointestinal:  Positive for abdominal pain, diarrhea, nausea and vomiting. Negative for constipation.  Musculoskeletal:  Negative for myalgias and neck pain.  Skin:  Negative for rash.  Allergic/Immunologic: Negative for environmental allergies.  Neurological:  Negative for dizziness and seizures.  Hematological:  Does not bruise/bleed easily.  Psychiatric/Behavioral:  Negative for suicidal ideas.   All other systems reviewed and are negative. Blood pressure 106/84,  pulse 97, temperature 98.6 F (37 C), temperature source Oral, resp. rate 14, height '5\' 8"'$  (1.727 m), weight 77.6 kg, SpO2 100 %. Physical Exam Constitutional:      Appearance: She is well-developed.     Comments: Conversant No acute distress  HENT:     Head: Normocephalic and atraumatic.  Eyes:     General: Lids are normal. No scleral icterus.    Pupils: Pupils are equal, round, and reactive to light.     Comments: Pupils are equal round and reactive No lid lag Moist conjunctiva  Neck:     Thyroid: No thyromegaly.     Trachea: No tracheal tenderness.     Comments: No cervical lymphadenopathy Cardiovascular:     Rate and Rhythm: Normal rate and regular rhythm.     Heart sounds: No murmur heard. Pulmonary:     Effort: Pulmonary effort is normal.     Breath sounds: Normal breath sounds. No wheezing or rales.  Abdominal:     Tenderness: There is abdominal tenderness (min ttp) in the right lower quadrant, suprapubic area and left lower quadrant. There is no guarding or rebound.     Hernia: No hernia is present.     Comments: Firm abd. No organomegaly  Musculoskeletal:     Cervical back: Normal range of motion and neck supple.  Skin:    General: Skin is warm.     Findings: No rash.     Nails: There is no clubbing.     Comments: Normal skin turgor  Neurological:     Mental Status: She is alert and oriented to person, place, and time.     Comments: Normal gait and station  Psychiatric:        Mood and Affect: Mood normal.        Thought Content: Thought content normal.        Judgment: Judgment normal.     Comments: Appropriate affect    Assessment/Plan: Patient is a 70 year old female, with SBO likely related to carcinomatosis versus primary lesion.  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 1.  At this time would recommend n.p.o., IV fluids, NG tube and SBO protocol. 2.  Patient would likely benefit from paracentesis.  The cells can be sent off for  possible localization of primary tumor. 3.  At this time no immediate plans for surgery.  We will follow along.  Brenda Stewart 04/21/2021, 7:38 PM

## 2021-04-21 NOTE — ED Notes (Signed)
Attempted to inset NG Tube x2 and pt did not tolerate. Pt would pull out tube mid insertion.

## 2021-04-21 NOTE — ED Provider Notes (Signed)
Emergency Medicine Provider Triage Evaluation Note  Brenda Stewart , a 70 y.o. female  was evaluated in triage.  Pt complains of abdominal pain and nausea that has been bothering her since April.  Endorses increased nausea and vomiting for the past 2 days. reports constipation but states that her last bowel movement was this morning.  [facility] and CT in August.   Review of Systems  Positive: Abd pain, nvC Negative: CP, dob  Physical Exam  BP 114/79 (BP Location: Left Arm)   Pulse (!) 110   Temp 98.4 F (36.9 C) (Oral)   Resp 16   SpO2 99%  Gen:   Awake, no distress   Resp:  Normal effort  GI:  Lower abdom tenderness.    Medical Decision Making  Medically screening exam initiated at 2:10 PM.  Appropriate orders placed.  LEAHMARIE LOJEWSKI was informed that the remainder of the evaluation will be completed by another provider, this initial triage assessment does not replace that evaluation, and the importance of remaining in the ED until their evaluation is complete.   Darliss Ridgel 04/21/21 1415    Wyvonnia Dusky, MD 04/21/21 1434

## 2021-04-21 NOTE — ED Notes (Signed)
Patient transported to CT 

## 2021-04-21 NOTE — H&P (Signed)
History and Physical    Brenda Stewart [id number] DOB: 05-06-1951 DOA: 04/21/2021  PCP: Pcp, No   Patient coming from: Home  Chief Complaint: Abdominal pain, nausea&vomiting, unintended weight loss.   HPI: Brenda Stewart is a 70 y.o. female with medical history significant for HTN, GERD who presents for evaluation of abdominal pain, nausea and vomiting. She reports she has been having symptoms of intermittent lower abdominal pain with nausea and vomiting for the last 3 to 4 months.  She states she has been going to the [name] [facility] in [place] where her PCP is for the last few months for evaluation of her symptoms.  She was being scheduled with GI for colonoscopy and a CT of her abdomen but this was not evident until mid August.  She reports she has been having increased nausea and vomiting over the last 2 to 3 days and not able to tolerate any p.o. intake.  She reports she has lost approximately 30 pounds in the last 8 to 9 months which is unintended.  She reports a decreased appetite and feels full after only a few bites for the last few months.  She states that she has had intermittent swelling and distention of her abdomen over the last few months as well.  She states that the vomit has been nonbloody.  She reports the abdominal pain is a sharp stabbing type pain that comes in waves.  She was given medication for constipation at her PCP office as well as an urgent care she went to last month.  She reports her symptoms have not improved.  She has not had any fever or chills.  She denies any injury or trauma to her abdomen or pelvis.  She has not had any recent antibiotic use. She states she has not taken her blood pressure medications for the last month as her blood pressure has been staying low and she was told if her blood pressure was around [id number] systolically to not take her medications by her PCP. She denies tobacco or illicit drug use.  She reports history of drinking alcohol socially but  has not had any alcohol in the last 2 to 3 months.  ED Course: In the emergency room Brenda Stewart has been hemodynamically stable.  She was given bolus of IV fluids in the emergency room as well as 1 dose of Zofran in triage before she was seen by ER provider.  She has not had any vomiting while in the emergency room.  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  Surgery has been consulted and Dr. Irineo Axon is to see the patient in the emergency room.  Labs reveal a sodium of 132, potassium 3.8, chloride 94, bicarb 23, creatinine 2.57, BUN 17, glucose 110, magnesium 1.8, calcium 9.5, alkaline phosphatase 77, AST 12, ALT 10, lipase 28.  WBC 13,000, hemoglobin 12.8, hematocrit 38.6, platelets 535,000.  Urinalysis is negative.  COVID swab is pending.  EKG revealed QT prolongation with a QTC of 615.  Hospitalist service was asked to admit for further management and work-up of neoplasm identified on CT.  Review of Systems:  General: Reports unintended weight loss. Denies fever, chills, night sweats.  Denies dizziness. Reports decreased appetite HENT: Denies head trauma, headache, denies change in hearing, tinnitus.  Denies nasal congestion.  Denies sore throat, Denies difficulty swallowing Eyes: Denies blurry vision, pain in eye, drainage.  Denies discoloration of eyes. Neck: Denies pain.  Denies swelling.  Denies pain with movement. Cardiovascular: Denies chest pain, palpitations.  Denies edema.  Denies orthopnea Respiratory: Denies shortness of breath, cough.  Denies wheezing.  Denies sputum production Gastrointestinal: Reports abdominal pain, swelling. Reports nausea, vomiting, constipation. Denies diarrhea.  Denies melena.  Denies hematemesis. Musculoskeletal: Denies limitation of movement.  Denies deformity or swelling.  Denies pain.  Denies arthralgias or myalgias. Genitourinary: Denies  pelvic pain.  Denies urinary frequency or hesitancy.  Denies dysuria.  Skin: Denies rash.  Denies petechiae, purpura, ecchymosis. Neurological: Denies syncope. Denies paresthesia. Denies speech change or drooping face.  Denies visual change. Psychiatric: Denies depression, anxiety.  Denies hallucinations.  Past Medical History:  Diagnosis Date   Acid reflux    Hypertension     Past Surgical History:  Procedure Laterality Date   BUNIONECTOMY Bilateral    HAND TENDON SURGERY Bilateral    PARTIAL HYSTERECTOMY      Social History  reports that she has never smoked. She has never used smokeless tobacco. She reports current alcohol use. She reports that she does not use drugs.  No Known Allergies  History reviewed. No pertinent family history.   Prior to Admission medications   Medication Sig Start Date End Date Taking? Authorizing Provider  amLODipine (NORVASC) 10 MG tablet TAKE ONE TABLET BY MOUTH DAILY (HOLD DOSE FOR SYSTOLIC BLOOD PRESSURE LESS THAN 100; AVOID GRAPEFRUIT OR ITS JUICE) 06/05/20 06/06/21 Yes [provider]  ascorbic acid (VITAMIN C) 500 MG tablet Take 1 tablet by mouth daily as needed. 12/04/11  Yes [provider]  aspirin 81 MG EC tablet Take 1 tablet by mouth daily. 01/07/07  Yes [provider]  Cholecalciferol 50 MCG (2000 UT) TABS Take 1 tablet by mouth daily. 06/08/20  Yes [provider]  lisinopril (ZESTRIL) 40 MG tablet TAKE ONE TABLET BY MOUTH DAILY (HOLD DOSE FOR SYSTOLIC BLOOD PRESSURE LESS THAN 100;AVOID POTASSIUM SUPPLEMENTS/ARTIFICIAL  SALT) 06/05/20 06/06/21 Yes [provider]  omeprazole (PRILOSEC) 20 MG capsule TAKE ONE CAPSULE BY MOUTH EVERY MORNING BEFORE A MEAL AS NEEDED (TAKE ON AN EMPTY STOMACH 30 MINUTES PRIOR TO A MEAL) FOR HEARTBURN/REFLUX 04/06/21  Yes [provider]  senna-docusate (SENOKOT-S) 8.6-50 MG tablet TAKE 2 TABLETS BY MOUTH DAILY FOR CONSTIPATION 04/06/21  Yes [provider]   amLODipine (NORVASC) 5 MG tablet Take 1 tablet (5 mg total) by mouth daily. 05/20/20   Sharion Balloon, NP  aspirin EC 81 MG tablet Take 81 mg by mouth daily. Swallow whole.    [provider]  gabapentin (NEURONTIN) 100 MG capsule Take 1 capsule (100 mg total) by mouth at bedtime. Patient not taking: Reported on 02/27/2021 10/16/16   Lysbeth Penner, FNP  lisinopril (PRINIVIL,ZESTRIL) 5 MG tablet Take 5 mg by mouth daily.    [provider]  polyethylene glycol powder (GLYCOLAX/MIRALAX) 17 GM/SCOOP powder One scoop (17g) with 8 oz of water daily for the next 7 days 02/27/21   Ok Edwards, PA-C    Physical Exam: Vitals:   04/21/21 1407 04/21/21 1704 04/21/21 1724  BP: 114/79  106/84  Pulse: (!) 110  97  Resp: 16  14  Temp: 98.4 F (36.9 C)  98.6 F (37 C)  TempSrc: Oral  Oral  SpO2: 99%  100%  Weight:  77.6 kg   Height:  '5\' 8"'$  (1.727 m)     Constitutional: NAD, calm, comfortable Vitals:   04/21/21 1407 04/21/21 1704 04/21/21  1724  BP: 114/79  106/84  Pulse: (!) 110  97  Resp: 16  14  Temp: 98.4 F (36.9 C)  98.6 F (37 C)  TempSrc: Oral  Oral  SpO2: 99%  100%  Weight:  77.6 kg   Height:  '5\' 8"'$  (1.727 m)    General: WDWN, Alert and oriented x3.  Eyes: EOMI, PERRL, conjunctivae normal.  Sclera nonicteric HENT:  [place]/AT, external ears normal.  Nares patent without epistasis.  Mucous membranes are dry. Posterior pharynx clear of any exudate or lesions.  Neck: Soft, normal range of motion, supple, no masses, Trachea midline Respiratory: clear to auscultation bilaterally, no wheezing, no crackles. Normal respiratory effort. No accessory muscle use.  Cardiovascular: Regular rate and rhythm, no murmurs / rubs / gallops. No extremity edema. 1+ pedal pulses. Abdomen: Soft, diffuse tenderness, nondistended, no rebound or guarding.  No masses palpated. Bowel sounds hypoactive. Negative murphy's sign, negative McBurney point tenderness. Negative CVA tenderness to  percussion. Musculoskeletal: FROM. no cyanosis. No joint deformity upper and lower extremities. Normal muscle tone.  Skin: Warm, dry, intact no rashes, lesions, ulcers. No induration Neurologic: CN 2-12 grossly intact.  Normal speech.  Sensation intact, patella DTR +1 bilaterally. Strength 5/5 in all extremities.   Psychiatric: Normal judgment and insight.  Normal mood.    Labs on Admission: I have personally reviewed following labs and imaging studies  CBC: Recent Labs  Lab 04/21/21 1413  WBC 13.0*  NEUTROABS 10.1*  HGB 12.8  HCT 38.6  MCV 92.8  PLT 535*    Basic Metabolic Panel: Recent Labs  Lab 04/21/21 1413  NA 132*  K 3.8  CL 94*  CO2 23  GLUCOSE 110*  BUN 17  CREATININE 2.57*  CALCIUM 9.5  MG 1.8    GFR: Estimated Creatinine Clearance: 23 mL/min (A) (by C-G formula based on SCr of 2.57 mg/dL (H)).  Liver Function Tests: Recent Labs  Lab 04/21/21 1413  AST 12*  ALT 10  ALKPHOS 77  BILITOT 0.7  PROT 8.1  ALBUMIN 3.5    Urine analysis:    Component Value Date/Time   COLORURINE AMBER (A) 04/21/2021 1407   APPEARANCEUR HAZY (A) 04/21/2021 1407   LABSPEC 1.024 04/21/2021 1407   PHURINE 5.0 04/21/2021 1407   GLUCOSEU NEGATIVE 04/21/2021 1407   HGBUR NEGATIVE 04/21/2021 1407   BILIRUBINUR SMALL (A) 04/21/2021 1407   KETONESUR 5 (A) 04/21/2021 1407   PROTEINUR 30 (A) 04/21/2021 1407   UROBILINOGEN 1.0 02/27/2021 1607   NITRITE NEGATIVE 04/21/2021 1407   LEUKOCYTESUR NEGATIVE 04/21/2021 1407    Radiological Exams on Admission: CT ABDOMEN PELVIS WO CONTRAST  Result Date: 04/21/2021 CLINICAL DATA:  Nausea and vomiting for 2 days. Lower abdominal pain for approximately 3 months. Chronic constipation. EXAM: CT ABDOMEN AND PELVIS WITHOUT CONTRAST TECHNIQUE: Multidetector CT imaging of the abdomen and pelvis was performed following the standard protocol without IV contrast. COMPARISON:  None. FINDINGS: Lower chest: No acute findings. Hepatobiliary: No mass  visualized on this unenhanced exam. Gallbladder is unremarkable. No evidence of biliary ductal dilatation. Pancreas: No mass or inflammatory process visualized on this unenhanced exam. Spleen:  Within normal limits in size. Adrenals/Urinary tract: No evidence of urolithiasis or hydronephrosis. Unremarkable unopacified urinary bladder. Stomach/Bowel: Small hiatal hernia is seen containing ascites. Moderate diffuse small bowel dilatation is seen, with suspected transition point in the right lower quadrant, suspicious for distal small bowel obstruction. Vascular/Lymphatic: No pathologically enlarged lymph nodes identified. No evidence of abdominal aortic aneurysm. Aortic atherosclerotic  calcification noted. Reproductive: Prior hysterectomy suspected. Moderate ascites is seen. Omental caking and diffuse mesenteric soft tissue stranding is seen. There is also probable peritoneal nodularity along the left pelvic sidewall. These findings are highly suspicious for peritoneal carcinomatosis. Other:  None. Musculoskeletal:  No suspicious bone lesions identified. 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 Suspected distal small bowel obstruction, with probable transition point in right lower quadrant. Aortic Atherosclerosis (ICD10-I70.0). Electronically Signed   By: Marlaine Hind M.D.   On: 04/21/2021 18:36    EKG: Independently reviewed.  EKG shows normal sinus rhythm with no acute ST elevation or depression.  QTc is prolonged at 615  Assessment/Plan Principal Problem:   SBO (small bowel obstruction)  Brenda Stewart is admitted to Med Telemetry floor.  NGT to LIWS placed for bowel decompression and bowel rest.  Surgery consulted and pt seen by Dr. Gevena Cotton.  Dilaudid as needed for pain overnight.  Antiemetic with Tigan provided. Zofran contraindicated with prolonged QT interval  Active Problems:   AKI (acute kidney injury)  IVF hydration with LR at 100 ml/hr overnight.  Recheck  electrolytes and renal function with labs in am.    Ascites May need consult with IR for peritoneal drainage and analysis of ascitic fluid Suspect is secondary to the peritoneal neoplasms are suspicious for carcinomatosis    Unintended weight loss Pt with 30 pound weight loss over past 8 months she states. Pt with suspected peritoneal carcinomatosis on CT scan. Will need biopsy vs ascitic  fluid analysis to assist with identification.     Nausea & vomiting Antiemetic provided. IVF hydration    Neoplasm of peritoneum Will need biopsy or cell analysis of ascitic fluid to assist with diagnosis workup    Hyponatremia Mild. Recheck electrolytes in am     Prolonged QT interval Avoid medications which could further prolong QT interval    Leukocytosis No signs of infection.  WBC is mildly elevated.  Most likely left shift from persistent nausea and vomiting     Thrombocytosis Recheck CBC in am. Heparin for DVT prophylaxis provided.    DVT prophylaxis: Heparin for DVT prophylaxis.   Code Status:   Full Code  Family Communication:  Diagnosis and plan discussed with patient.  Patient verbalized understanding agrees with plan.  Questions answered.  Further recommendations to follow as clinical indicated Disposition Plan:   Patient is from:  Home  Anticipated DC to:  Home  Anticipated DC date:  Anticipate 2 midnight or more stay in the hospital  Anticipated DC barriers: No barriers to discharge identified at this time  Consults called:  Surgery, Dr. Rosendo Gros  Admission status:  Inpatient   Yevonne Aline Kylian Loh MD [facility]  How to contact the [facility] Attending or Consulting provider [place] or covering provider during after hours [place], for this patient?   Check the care team in [facility] and look for a) attending/consulting [facility] provider listed and b) the [facility] team listed Log into [email or web address] and use [place]'s universal password to access. If you do not have the password, please  contact the hospital operator. Locate the [facility] provider you are looking for under [facility] and page to a number that you can be directly reached. If you still have difficulty reaching the provider, please page the [facility] (Director on Call) for the Hospitalists listed on amion for assistance.  04/21/2021, 7:42 PM

## 2021-04-21 NOTE — ED Provider Notes (Signed)
[place] EMERGENCY DEPARTMENT Provider Note   CSN: [id number] Arrival date & time: 04/21/21  1359     History No chief complaint on file.   Brenda Stewart is a 70 y.o. female with PMHx HTN and GERD who presents to the ED today with complaint of gradual onset, intermittent, lower abdominal pain for the past 3-4 months.  Patient also complains of nausea, intermittent nonbloody nonbilious emesis, as well as acid reflux feeling.  She is also mentions that she has had about a 20 pound weight loss since October of last year unintentionally.  She states that she takes omeprazole daily for her acid reflux without relief.  She plans to go see the [facility] next month for colonoscopy as well as CT scan however states that over the last 2 days she began having worsening vomiting and worsening pain prompting ED visit today.  Denies any fevers or chills.  She does report history of chronic constipation for which she takes docusate.  She states that since taking those it has given her some diarrhea. She denies any recent sick contacts. No suspicious food intake. No recent abx use.   The history is provided by the patient and medical records.      Past Medical History:  Diagnosis Date   Acid reflux    Hypertension     There are no problems to display for this patient.   Past Surgical History:  Procedure Laterality Date   BUNIONECTOMY Bilateral    HAND TENDON SURGERY Bilateral    PARTIAL HYSTERECTOMY       OB History   No obstetric history on file.     No family history on file.  Social History   Tobacco Use   Smoking status: Never   Smokeless tobacco: Never  Vaping Use   Vaping Use: Never used  Substance Use Topics   Alcohol use: Yes   Drug use: No    Home Medications Prior to Admission medications   Medication Sig Start Date End Date Taking? Authorizing Provider  amLODipine (NORVASC) 10 MG tablet TAKE ONE TABLET BY MOUTH DAILY (HOLD DOSE FOR SYSTOLIC BLOOD  PRESSURE LESS THAN 100; AVOID GRAPEFRUIT OR ITS JUICE) 06/05/20 06/06/21 Yes [provider]  ascorbic acid (VITAMIN C) 500 MG tablet Take 1 tablet by mouth daily as needed. 12/04/11  Yes [provider]  aspirin 81 MG EC tablet Take 1 tablet by mouth daily. 01/07/07  Yes [provider]  Cholecalciferol 50 MCG (2000 UT) TABS Take 1 tablet by mouth daily. 06/08/20  Yes [provider]  lisinopril (ZESTRIL) 40 MG tablet TAKE ONE TABLET BY MOUTH DAILY (HOLD DOSE FOR SYSTOLIC BLOOD PRESSURE LESS THAN 100;AVOID POTASSIUM SUPPLEMENTS/ARTIFICIAL  SALT) 06/05/20 06/06/21 Yes [provider]  omeprazole (PRILOSEC) 20 MG capsule TAKE ONE CAPSULE BY MOUTH EVERY MORNING BEFORE A MEAL AS NEEDED (TAKE ON AN EMPTY STOMACH 30 MINUTES PRIOR TO A MEAL) FOR HEARTBURN/REFLUX 04/06/21  Yes [provider]  senna-docusate (SENOKOT-S) 8.6-50 MG tablet TAKE 2 TABLETS BY MOUTH DAILY FOR CONSTIPATION 04/06/21  Yes [provider]  amLODipine (NORVASC) 5 MG tablet Take 1 tablet (5 mg total) by mouth daily. 05/20/20   Sharion Balloon, NP  aspirin EC 81 MG tablet Take 81 mg by mouth daily. Swallow whole.    [provider]  gabapentin (NEURONTIN) 100 MG capsule Take 1 capsule (100 mg total) by mouth at bedtime. Patient not taking: Reported on 02/27/2021 10/16/16   Stevan Born  J, FNP  lisinopril (PRINIVIL,ZESTRIL) 5 MG tablet Take 5 mg by mouth daily.    [provider]  polyethylene glycol powder (GLYCOLAX/MIRALAX) 17 GM/SCOOP powder One scoop (17g) with 8 oz of water daily for the next 7 days 02/27/21   Ok Edwards, PA-C    Allergies    Patient has no known allergies.  Review of Systems   Review of Systems  Constitutional:  Negative for chills and fever.  Cardiovascular:  Negative for chest pain.  Gastrointestinal:  Positive for abdominal pain, diarrhea, nausea and vomiting.  All other systems reviewed and are negative.  Physical Exam Updated Vital  Signs BP 106/84 (BP Location: Left Arm)   Pulse 97   Temp 98.6 F (37 C) (Oral)   Resp 14   Ht '5\' 8"'$  (1.727 m)   Wt 77.6 kg   SpO2 100%   BMI 26.00 kg/m   Physical Exam Vitals and nursing note reviewed.  Constitutional:      Appearance: She is not ill-appearing or diaphoretic.  HENT:     Head: Normocephalic and atraumatic.  Eyes:     Conjunctiva/sclera: Conjunctivae normal.  Cardiovascular:     Rate and Rhythm: Normal rate and regular rhythm.     Pulses: Normal pulses.  Pulmonary:     Effort: Pulmonary effort is normal.     Breath sounds: Normal breath sounds. No wheezing, rhonchi or rales.  Abdominal:     Palpations: Abdomen is soft.     Tenderness: There is abdominal tenderness. There is no guarding or rebound.     Comments: Soft, mild diffuse lower abdominal TTP, mildly decreased BS, no r/g/r, neg murphy's, neg mcburney's, no CVA TTP  Musculoskeletal:     Cervical back: Neck supple.  Skin:    General: Skin is warm and dry.  Neurological:     Mental Status: She is alert.    ED Results / Procedures / Treatments   Labs (all labs ordered are listed, but only abnormal results are displayed) Labs Reviewed  CBC WITH DIFFERENTIAL/PLATELET - Abnormal; Notable for the following components:      Result Value   WBC 13.0 (*)    Platelets 535 (*)    Neutro Abs 10.1 (*)    Abs Immature Granulocytes 0.33 (*)    All other components within normal limits  COMPREHENSIVE METABOLIC PANEL - Abnormal; Notable for the following components:   Sodium 132 (*)    Chloride 94 (*)    Glucose, Bld 110 (*)    Creatinine, Ser 2.57 (*)    AST 12 (*)    GFR, Estimated 20 (*)    All other components within normal limits  URINALYSIS, ROUTINE W REFLEX MICROSCOPIC - Abnormal; Notable for the following components:   Color, Urine AMBER (*)    APPearance HAZY (*)    Bilirubin Urine SMALL (*)    Ketones, ur 5 (*)    Protein, ur 30 (*)    All other components within normal limits  RESP PANEL  BY RT-PCR (FLU A&B, COVID) [id number]  LIPASE, BLOOD  MAGNESIUM    EKG EKG Interpretation  Date/Time:  Saturday April 21 2021 14:05:12 EDT Ventricular Rate:  123 PR Interval:    QRS Duration: 86 QT Interval:  430 QTC Calculation: 615 R Axis:   51 Text Interpretation: Long QTc Accelerated Junctional rhythm Abnormal ECG No old tracing to compare Confirmed by Deno Etienne [phone]) on 04/21/2021 3:44:31 PM  Radiology CT ABDOMEN PELVIS WO CONTRAST  Result Date:  04/21/2021 CLINICAL DATA:  Nausea and vomiting for 2 days. Lower abdominal pain for approximately 3 months. Chronic constipation. EXAM: CT ABDOMEN AND PELVIS WITHOUT CONTRAST TECHNIQUE: Multidetector CT imaging of the abdomen and pelvis was performed following the standard protocol without IV contrast. COMPARISON:  None. FINDINGS: Lower chest: No acute findings. Hepatobiliary: No mass visualized on this unenhanced exam. Gallbladder is unremarkable. No evidence of biliary ductal dilatation. Pancreas: No mass or inflammatory process visualized on this unenhanced exam. Spleen:  Within normal limits in size. Adrenals/Urinary tract: No evidence of urolithiasis or hydronephrosis. Unremarkable unopacified urinary bladder. Stomach/Bowel: Small hiatal hernia is seen containing ascites. Moderate diffuse small bowel dilatation is seen, with suspected transition point in the right lower quadrant, suspicious for distal small bowel obstruction. Vascular/Lymphatic: No pathologically enlarged lymph nodes identified. No evidence of abdominal aortic aneurysm. Aortic atherosclerotic calcification noted. Reproductive: Prior hysterectomy suspected. Moderate ascites is seen. Omental caking and diffuse mesenteric soft tissue stranding is seen. There is also probable peritoneal nodularity along the left pelvic sidewall. These findings are highly suspicious for peritoneal carcinomatosis. Other:  None. Musculoskeletal:  No suspicious bone lesions identified. IMPRESSION: Findings  highly suspicious for diffuse peritoneal carcinomatosis. Suspected distal small bowel obstruction, with probable transition point in right lower quadrant. Aortic Atherosclerosis (ICD10-I70.0). Electronically Signed   By: Marlaine Hind M.D.   On: 04/21/2021 18:36    Procedures Procedures   Medications Ordered in ED Medications  sodium chloride 0.9 % bolus 1,000 mL (1,000 mLs Intravenous New Bag/Given 04/21/21 1719)  ondansetron (ZOFRAN) injection 4 mg (4 mg Intravenous Given 04/21/21 1720)    ED Course  I have reviewed the triage vital signs and the nursing notes.  Pertinent labs & imaging results that were available during my care of the patient were reviewed by me and considered in my medical decision making (see chart for details).    MDM Rules/Calculators/A&P                           70 year old female who presents to the ED today with complaint of worsening lower abdominal pain for the past 2 days with associated nausea, nonbloody nonbilious emesis.  History of unintentional weight loss over the past year, 20 pounds in total.  Also reports issues with acid reflux.  On arrival to the ED today patient was afebrile.  She is mildly tachycardic at 110, nontachypneic.  She did have screening labs done in the waiting room including a CBC, CMP, lipase.  CBC shows a leukocytosis of 13,000.  CMP with a sodium 132, chloride 94.  Creatinine elevated today at 2.57 with baseline < 1 year ago.  I paced 28.  Initially had ordered a CT abdomen and pelvis with contrast while in the waiting room however given kidney function today we will obtain 1 without contrast.  Will provide fluids, Zofran with plans for reevaluation.   CT: IMPRESSION:  Findings highly suspicious for diffuse peritoneal carcinomatosis.     Suspected distal small bowel obstruction, with probable transition  point in right lower quadrant.     Aortic Atherosclerosis (ICD10-I70.0).   Discussed case with medical oncologist Dr. Julien Nordmann  who recommends admission, general surgery consult with possible biopsy with gen surg. Will follow up on biopsy results.   Discussed case with general surgeon Dr. Grandville Silos. Recommends NG tube at this time. Will make Dr. Rosendo Gros aware as he is coming on soon and will come evaluate patient. Will admit to medicine at this  time.   Discussed case with [facility] Dr. Tonie Griffith who agrees to evaluate patient for admission.   This note was prepared using Dragon voice recognition software and may include unintentional dictation errors due to the inherent limitations of voice recognition software.   Final Clinical Impression(s) / ED Diagnoses Final diagnoses:  SBO (small bowel obstruction) ([place])  AKI (acute kidney injury) ([place])  Abnormal CT scan  Peritoneal carcinomatosis [facility])    Rx / DC Orders ED Discharge Orders     None        Eustaquio Maize, PA-C 04/21/21 1912    Deno Etienne, DO 04/21/21 [place], [place], DO 04/21/21 [phone]

## 2021-04-22 ENCOUNTER — Inpatient Hospital Stay (HOSPITAL_COMMUNITY): Payer: Medicare Other

## 2021-04-22 DIAGNOSIS — K56699 Other intestinal obstruction unspecified as to partial versus complete obstruction: Secondary | ICD-10-CM | POA: Diagnosis not present

## 2021-04-22 DIAGNOSIS — R188 Other ascites: Secondary | ICD-10-CM

## 2021-04-22 DIAGNOSIS — K56609 Unspecified intestinal obstruction, unspecified as to partial versus complete obstruction: Secondary | ICD-10-CM | POA: Diagnosis not present

## 2021-04-22 LAB — BASIC METABOLIC PANEL
Anion gap: 13 (ref 5–15)
BUN: 21 mg/dL (ref 8–23)
CO2: 23 mmol/L (ref 22–32)
Calcium: 9 mg/dL (ref 8.9–10.3)
Chloride: 99 mmol/L (ref 98–111)
Creatinine, Ser: 2.35 mg/dL — ABNORMAL HIGH (ref 0.44–1.00)
GFR, Estimated: 22 mL/min — ABNORMAL LOW (ref >60)
Glucose, Bld: 104 mg/dL — ABNORMAL HIGH (ref 70–99)
Potassium: 3.9 mmol/L (ref 3.5–5.1)
Sodium: 135 mmol/L (ref 135–145)

## 2021-04-22 LAB — CBC
HCT: 33.9 % — ABNORMAL LOW (ref 36.0–46.0)
Hemoglobin: 11.3 g/dL — ABNORMAL LOW (ref 12.0–15.0)
MCH: 30.9 pg (ref 26.0–34.0)
MCHC: 33.3 g/dL (ref 30.0–36.0)
MCV: 92.6 fL (ref 80.0–100.0)
Platelets: 445 10*3/uL — ABNORMAL HIGH (ref 150–400)
RBC: 3.66 MIL/uL — ABNORMAL LOW (ref 3.87–5.11)
RDW: 13.1 % (ref 11.5–15.5)
WBC: 10.8 10*3/uL — ABNORMAL HIGH (ref 4.0–10.5)
nRBC: 0 % (ref 0.0–0.2)

## 2021-04-22 LAB — HIV ANTIBODY (ROUTINE TESTING W REFLEX): HIV Screen 4th Generation wRfx: NONREACTIVE

## 2021-04-22 MED ORDER — ONDANSETRON HCL 4 MG/2ML IJ SOLN
4.0000 mg | Freq: Four times a day (QID) | INTRAMUSCULAR | Status: DC | PRN
  Administered 2021-04-22 – 2021-05-06 (×7): 4 mg via INTRAVENOUS
  Filled 2021-04-22 (×8): qty 2

## 2021-04-22 NOTE — ED Notes (Signed)
Pt completed oral intake of contrast, x-ray notified and will x-ray pt in 8 hrs

## 2021-04-22 NOTE — ED Notes (Signed)
Attempted to place NG Tube and was unsuccessful. Dr. Hal Hope has been notified.

## 2021-04-22 NOTE — ED Notes (Signed)
Pt is vomiting. Messaged provider for nausea meds.

## 2021-04-22 NOTE — ED Notes (Signed)
Pt unable to tolerate paracentesis at this time. Pt experiencing intractable vomiting. PRN antiemetic given in [place] but pt cannot lay down flat and cannot tolerate procedure without vomiting. Pt brought back to ED and moved to rm 40. Report given to nurse for 40.

## 2021-04-22 NOTE — ED Notes (Signed)
Dr. Hal Hope made aware that pt is refusing NG Tube.

## 2021-04-22 NOTE — Procedures (Signed)
Patient arrived to [place] for paracentesis with intractable nausea and vomiting. As the Patient is unable to stay still long enough to perform the procedure due to safety concerns the procedure was aborted. Patient will be placed on the IR schedule for 8.1.22 pending her condition improves. Team made aware.

## 2021-04-22 NOTE — ED Notes (Signed)
[place] called and reported that pt is nauseated and vomiting. PRN antiemetic requested from pharmacy at this time- Will take to [place] as soon as it is available

## 2021-04-22 NOTE — Consult Note (Signed)
[facility] [phone]   [phone]  CONSULT NOTE  REFERRING PHYSICIAN: Dr. Berle Mull  REASON FOR CONSULTATION:  70 years old African-American female with suspicious peritoneal carcinomatosis.  HPI Brenda Stewart is a 70 y.o. female with past medical history significant for hypertension and GERD.  The patient also has a history of alcohol abuse but no smoking history.  She is also status post hysterectomy.  She is followed by the [facility] in [place] for her care.  She presented to the emergency department complaining of nausea and vomiting for the last 2-3 days in addition to lack of appetite and poor p.o. intake.  She also has weight loss of around 30 pounds in the last 9 months with abdominal distention and constipation.  She mentioned that her last colonoscopy at the [name] system was about 7 years ago.  She had CT scan of the abdomen and pelvis yesterday for evaluation of her condition and that showed findings highly suspicious for diffuse peritoneal carcinomatosis with suspected distal small bowel obstruction with probable transition point in the right lower quadrant. I was consulted to see the patient and give recommendation regarding her condition. When seen today she is feeling much better after receiving IV hydration overnight.  She continues to have intermittent nausea in addition to constipation.  NG tube was tried but was not successful. The patient denied having any fever or chills.  She has no chest pain, shortness of breath, cough or hemoptysis.  She denied having any significant weight loss or night sweats. Family history significant for brother and sister with cancer of unknown type. The patient is single and has 1 daughter.  She is retired from [employer] and worked in several jobs including [employer].  She has no history of smoking but drinks alcohol at regular basis and no history of drug abuse.  HPI  Past Medical History:   Diagnosis Date   Acid reflux    Hypertension     Past Surgical History:  Procedure Laterality Date   BUNIONECTOMY Bilateral    HAND TENDON SURGERY Bilateral    PARTIAL HYSTERECTOMY      History reviewed. No pertinent family history.  Social History Social History   Tobacco Use   Smoking status: Never   Smokeless tobacco: Never  Vaping Use   Vaping Use: Never used  Substance Use Topics   Alcohol use: Yes   Drug use: No    No Known Allergies  Current Facility-Administered Medications  Medication Dose Route Frequency Provider Last Rate Last Admin   acetaminophen (TYLENOL) tablet 650 mg  650 mg Oral Q6H PRN Chotiner, Yevonne Aline, MD       Or   acetaminophen (TYLENOL) suppository 650 mg  650 mg Rectal Q6H PRN Chotiner, Yevonne Aline, MD       heparin injection 5,000 Units  5,000 Units Subcutaneous Q8H Chotiner, Yevonne Aline, MD   5,000 Units at 04/22/21 0518   HYDROmorphone (DILAUDID) injection 1 mg  1 mg Intravenous Q3H PRN Chotiner, Yevonne Aline, MD   1 mg at 04/22/21 1040   lactated ringers infusion   Intravenous Continuous Chotiner, Yevonne Aline, MD 100 mL/hr at 04/22/21 0518 New Bag at 04/22/21 0518   trimethobenzamide (TIGAN) injection 200 mg  200 mg Intramuscular Q6H PRN Chotiner, Yevonne Aline, MD       Current Outpatient Medications  Medication Sig Dispense Refill   amLODipine (NORVASC) 10 MG tablet TAKE ONE TABLET BY MOUTH DAILY (HOLD  DOSE FOR SYSTOLIC BLOOD PRESSURE LESS THAN 100; AVOID GRAPEFRUIT OR ITS JUICE)     ascorbic acid (VITAMIN C) 500 MG tablet Take 1 tablet by mouth daily as needed.     aspirin 81 MG EC tablet Take 1 tablet by mouth daily.     Cholecalciferol 50 MCG (2000 UT) TABS Take 1 tablet by mouth daily.     lisinopril (ZESTRIL) 40 MG tablet TAKE ONE TABLET BY MOUTH DAILY (HOLD DOSE FOR SYSTOLIC BLOOD PRESSURE LESS THAN 100;AVOID POTASSIUM SUPPLEMENTS/ARTIFICIAL  SALT)     omeprazole (PRILOSEC) 20 MG capsule TAKE ONE CAPSULE BY MOUTH EVERY MORNING BEFORE A MEAL AS  NEEDED (TAKE ON AN EMPTY STOMACH 30 MINUTES PRIOR TO A MEAL) FOR HEARTBURN/REFLUX     senna-docusate (SENOKOT-S) 8.6-50 MG tablet TAKE 2 TABLETS BY MOUTH DAILY FOR CONSTIPATION     amLODipine (NORVASC) 5 MG tablet Take 1 tablet (5 mg total) by mouth daily. 30 tablet 0   aspirin EC 81 MG tablet Take 81 mg by mouth daily. Swallow whole.     gabapentin (NEURONTIN) 100 MG capsule Take 1 capsule (100 mg total) by mouth at bedtime. (Patient not taking: Reported on 02/27/2021) 30 capsule 0   lisinopril (PRINIVIL,ZESTRIL) 5 MG tablet Take 5 mg by mouth daily.     polyethylene glycol powder (GLYCOLAX/MIRALAX) 17 GM/SCOOP powder One scoop (17g) with 8 oz of water daily for the next 7 days 255 g 0    Review of Systems  Constitutional: positive for anorexia, fatigue, and weight loss Eyes: negative Ears, nose, mouth, throat, and face: negative Respiratory: negative Cardiovascular: negative Gastrointestinal: positive for constipation, nausea, reflux symptoms, and vomiting Genitourinary:negative Integument/breast: negative Hematologic/lymphatic: negative Musculoskeletal:negative Neurological: negative Behavioral/Psych: negative Endocrine: negative Allergic/Immunologic: negative  Physical Exam  [id number], healthy, no distress, well nourished, and well developed SKIN: skin color, texture, turgor are normal, no rashes or significant lesions HEAD: Normocephalic, No masses, lesions, tenderness or abnormalities EYES: normal, PERRLA, Conjunctiva are pink and non-injected EARS: External ears normal, Canals clear OROPHARYNX:no exudate, no erythema, and lips, buccal mucosa, and tongue normal  NECK: supple, no adenopathy, no JVD LYMPH:  no palpable lymphadenopathy, no hepatosplenomegaly BREAST:not examined LUNGS: clear to auscultation , and palpation HEART: regular rate & rhythm, no murmurs, and no gallops ABDOMEN:non-tender and distended BACK: Back symmetric, no curvature., No CVA  tenderness EXTREMITIES:no joint deformities, effusion, or inflammation, no edema  NEURO: alert & oriented x 3 with fluent speech, no focal motor/sensory deficits  PERFORMANCE STATUS: ECOG 1  LABORATORY DATA: Lab Results  Component Value Date   WBC 10.8 (H) 04/22/2021   HGB 11.3 (L) 04/22/2021   HCT 33.9 (L) 04/22/2021   MCV 92.6 04/22/2021   PLT 445 (H) 04/22/2021    '@LASTCHEM'$ @  RADIOGRAPHIC STUDIES: CT ABDOMEN PELVIS WO CONTRAST  Result Date: 04/21/2021 CLINICAL DATA:  Nausea and vomiting for 2 days. Lower abdominal pain for approximately 3 months. Chronic constipation. EXAM: CT ABDOMEN AND PELVIS WITHOUT CONTRAST TECHNIQUE: Multidetector CT imaging of the abdomen and pelvis was performed following the standard protocol without IV contrast. COMPARISON:  None. FINDINGS: Lower chest: No acute findings. Hepatobiliary: No mass visualized on this unenhanced exam. Gallbladder is unremarkable. No evidence of biliary ductal dilatation. Pancreas: No mass or inflammatory process visualized on this unenhanced exam. Spleen:  Within normal limits in size. Adrenals/Urinary tract: No evidence of urolithiasis or hydronephrosis. Unremarkable unopacified urinary bladder. Stomach/Bowel: Small hiatal hernia is seen containing ascites. Moderate diffuse small bowel dilatation is seen, with suspected transition  point in the right lower quadrant, suspicious for distal small bowel obstruction. Vascular/Lymphatic: No pathologically enlarged lymph nodes identified. No evidence of abdominal aortic aneurysm. Aortic atherosclerotic calcification noted. Reproductive: Prior hysterectomy suspected. Moderate ascites is seen. Omental caking and diffuse mesenteric soft tissue stranding is seen. There is also probable peritoneal nodularity along the left pelvic sidewall. These findings are highly suspicious for peritoneal carcinomatosis. Other:  None. Musculoskeletal:  No suspicious bone lesions identified. IMPRESSION: Findings  highly suspicious for diffuse peritoneal carcinomatosis. Suspected distal small bowel obstruction, with probable transition point in right lower quadrant. Aortic Atherosclerosis (ICD10-I70.0). Electronically Signed   By: Marlaine Hind M.D.   On: 04/21/2021 18:36    ASSESSMENT: This is a very pleasant 70 years old African-American female presented with highly suspicious diffuse peritoneal carcinomatosis with distal small bowel obstruction.  These findings are highly suspicious for malignancy either from gastrointestinal source or gynecological etiology. Her last colonoscopy was around 8 years ago.   PLAN: I had a lengthy discussion with the patient today about her condition and further investigation to confirm her diagnosis. I recommended for the patient to undergo ultrasound-guided paracentesis for drainage of the ascites and also send the fluid for cytologic evaluation.  If the results of the cytology give [place] the diagnosis of her malignancy then there would be no more need for additional biopsies. If the cytology is negative, the patient may need biopsy either with needle core biopsy or exploratory laparotomy and biopsy. Depending on the final pathology, the patient could benefit from treatment with debulking and additional systemic therapy if it is of gynecologic malignancy. If the final pathology consistent with gastrointestinal source, the patient could be treated with gastrointestinal regimen I will also order tumor markers including CEA, CA 19-9 and CA125 today. Will arrange for the patient a follow-up appointment at the cancer center with the appropriate clinic depending on the final pathology of her cancer. For the small bowel obstructions, she is followed by general surgery.  The patient voices understanding of current disease status and treatment options and is in agreement with the current care plan.  All questions were answered. The patient knows to call the clinic with any problems,  questions or concerns. We can certainly see the patient much sooner if necessary.  Thank you so much for allowing me to participate in the care of Brenda Stewart. I will continue to follow up the patient with you and assist in her care.  Disclaimer: This note was dictated with voice recognition software. Similar sounding words can inadvertently be transcribed and may not be corrected upon review.   Eilleen Kempf April 22, 2021, 10:56 AM

## 2021-04-22 NOTE — Progress Notes (Signed)
[facility] Progress Note  Patient: Brenda Stewart    [id number]  DOA: 04/21/2021     Date of Service: the patient was seen and examined on 04/22/2021  Brief hospital course: Past medical history of HTN, GERD.  Presents with complaints of abdominal pain found to have small bowel obstruction as well as potential for peritoneal carcinomatosis with unknown primary. Currently plan is continue conservative measures and further work-up.  Subjective: Continues to have nausea.  No vomiting.  No abdominal pain right now.  Not passing gas, no BM.  No chest pain.  No shortness of breath.  No dizziness.  Assessment and Plan: SBO (small bowel obstruction) Currently no NG tube inserted. No significant vomiting.  No nausea as well. Will continue as needed Zofran. Continue IV fluid. Remains NPO. Pain controlled. General surgery following.   AKI (acute kidney injury) Baseline serum creatinine normal. On presentation serum creatinine 2.57. Currently improved to 2.3. Monitor.   Ascites Ultrasound paracentesis consults requested although patient does not have any significant ascites on ultrasound. Will monitor and reattempt   Unintended weight loss Peritoneal carcinomatosis. Anorexia. Pt with 30 pound weight loss over past 8 months she states. Pt with suspected peritoneal carcinomatosis on CT scan.  Appreciate oncology consultation. Tumor markers consulted. Outpatient follow-up requested as well. Ultrasound paracentesis requested for biopsy.  Hyponatremia Mild. Recheck electrolytes in am    Prolonged QT interval Junctional rhythm with tachycardia Continue monitor on telemetry. QT is slightly resolved.   Leukocytosis No signs of infection.  WBC is mildly elevated.  Most likely left shift from persistent nausea and vomiting   Thrombocytosis Recheck CBC in am. Heparin for DVT prophylaxis provided.    Scheduled Meds:  heparin  5,000 Units Subcutaneous Q8H   Continuous  Infusions:  lactated ringers 100 mL/hr at 04/22/21 0518   PRN Meds: acetaminophen **OR** acetaminophen, HYDROmorphone (DILAUDID) injection, ondansetron (ZOFRAN) IV, trimethobenzamide  Body mass index is 26 kg/m.        DVT Prophylaxis:   heparin injection 5,000 Units Start: 04/21/21 2200    Advance goals of care discussion: Pt is Full code.  Family Communication: NO family was present at bedside, at the time of interview.   Data Reviewed: I have personally reviewed and interpreted daily labs, tele strips, imaging. BMP stable.  Creatinine improving.  WBC improving.  Hemoglobin mildly low.  Physical Exam:  General: Appear in mild distress, no Rash; Oral Mucosa Clear, moist. no Abnormal Neck Mass Or lumps, Conjunctiva normal  Cardiovascular: S1 and S2 Present, no Murmur, Respiratory: good respiratory effort, Bilateral Air entry present and CTA, no Crackles, no wheezes Abdomen: Bowel Sound present, Soft and no tenderness Extremities: no Pedal edema Neurology: alert and oriented to time, place, and person affect appropriate. no new focal deficit Gait not checked due to patient safety concerns   Vitals:   04/22/21 1245 04/22/21 1300 04/22/21 1400 04/22/21 1523  BP: 115/71 116/77 114/79 (!) 119/94  Pulse: 79 82 80 89  Resp: '13 18 18 17  '$ Temp:   97.6 F (36.4 C) 97.6 F (36.4 C)  TempSrc:   Oral Oral  SpO2: 95% 96% 93% 96%  Weight:      Height:        Disposition:  Status is: Inpatient  Remains inpatient appropriate because:IV treatments appropriate due to intensity of illness or inability to take PO  Dispo: The patient is from: Home              Anticipated d/c is  to: Home              Patient currently is not medically stable to d/c.   Difficult to place patient No  Time spent: 35 minutes. I reviewed all nursing notes, pharmacy notes, vitals, pertinent old records. I have discussed plan of care as described above with RN.  Author: Berle Mull, MD [facility] 04/22/2021 8:26 PM  To reach On-call, see care teams to locate the attending and reach out via [email or web address]. Between 7PM-7AM, please contact night-coverage If you still have difficulty reaching the attending provider, please page the [facility] (Director on Call) for [facility] on amion for assistance.

## 2021-04-22 NOTE — Progress Notes (Signed)
Patient ID: Brenda Stewart, female   DOB: 04/01/1951, 70 y.o.   MRN: [id number]     Subjective: Emesis overnight, they were unable to get NGT in ROS negative except as listed above. Objective: Vital signs in last 24 hours: Temp:  [98.4 F (36.9 C)-98.6 F (37 C)] 98.6 F (37 C) (07/30 1724) Pulse Rate:  [90-125] 92 (07/31 0500) Resp:  [14-25] 19 (07/31 0215) BP: (94-146)/(65-126) 97/74 (07/31 0500) SpO2:  [94 %-100 %] 97 % (07/31 0500) Weight:  [77.6 kg] 77.6 kg (07/30 1704)    Intake/Output from previous day: No intake/output data recorded. Intake/Output this shift: No intake/output data recorded.  General appearance: cooperative Resp: clear to auscultation bilaterally Cardio: regular rate and rhythm GI: soft, distended, fluid  Lab Results: CBC  Recent Labs    04/21/21 1413 04/22/21 0509  WBC 13.0* 10.8*  HGB 12.8 11.3*  HCT 38.6 33.9*  PLT 535* 445*   BMET Recent Labs    04/21/21 1413 04/22/21 0509  NA 132* 135  K 3.8 3.9  CL 94* 99  CO2 23 23  GLUCOSE 110* 104*  BUN 17 21  CREATININE 2.57* 2.35*  CALCIUM 9.5 9.0   PT/INR No results for input(s): LABPROT, INR in the last 72 hours. ABG No results for input(s): PHART, HCO3 in the last 72 hours.  Invalid input(s): PCO2, PO2  Studies/Results: CT ABDOMEN PELVIS WO CONTRAST  Result Date: 04/21/2021 CLINICAL DATA:  Nausea and vomiting for 2 days. Lower abdominal pain for approximately 3 months. Chronic constipation. EXAM: CT ABDOMEN AND PELVIS WITHOUT CONTRAST TECHNIQUE: Multidetector CT imaging of the abdomen and pelvis was performed following the standard protocol without IV contrast. COMPARISON:  None. FINDINGS: Lower chest: No acute findings. Hepatobiliary: No mass visualized on this unenhanced exam. Gallbladder is unremarkable. No evidence of biliary ductal dilatation. Pancreas: No mass or inflammatory process visualized on this unenhanced exam. Spleen:  Within normal limits in size. Adrenals/Urinary  tract: No evidence of urolithiasis or hydronephrosis. Unremarkable unopacified urinary bladder. Stomach/Bowel: Small hiatal hernia is seen containing ascites. Moderate diffuse small bowel dilatation is seen, with suspected transition point in the right lower quadrant, suspicious for distal small bowel obstruction. Vascular/Lymphatic: No pathologically enlarged lymph nodes identified. No evidence of abdominal aortic aneurysm. Aortic atherosclerotic calcification noted. Reproductive: Prior hysterectomy suspected. Moderate ascites is seen. Omental caking and diffuse mesenteric soft tissue stranding is seen. There is also probable peritoneal nodularity along the left pelvic sidewall. These findings are highly suspicious for peritoneal carcinomatosis. Other:  None. Musculoskeletal:  No suspicious bone lesions identified. 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 Suspected distal small bowel obstruction, with probable transition point in right lower quadrant. Aortic Atherosclerosis (ICD10-I70.0). Electronically Signed   By: Marlaine Hind M.D.   On: 04/21/2021 18:36    Anti-infectives: Anti-infectives (From admission, onward)    None       Assessment/Plan: SBO likely due to carcinomatosis - SBO protocol, may take contrast PO if unable to place NGT - recommend paracentesis for cytology - no need for emergent surgery  LOS: 1 day    Georganna Skeans, MD, MPH, FACS Tra[facility] General Surgery Use [email or web address] to contact on call provider  04/22/2021

## 2021-04-22 NOTE — ED Notes (Signed)
Pt given contrast to take orally per MD note. Pt attempting to drink at this time

## 2021-04-22 NOTE — ED Notes (Signed)
Pt resting quietly in bed at this time, no needs voiced

## 2021-04-22 NOTE — Progress Notes (Addendum)
NEW ADMISSION NOTE New Admission Note:   Arrival Method: E.D. stretcher bed Mental Orientation: Alert and oriented x 4 Telemetry: #3  Assessment: Completed Skin:Skin intact ,assessed with Weldon Picking R.N. IV: Right A/C with LR '@100cc'$ /hr. Pain:Denies. Tubes:None Safety Measures: Safety Fall Prevention Plan has been given, discussed and signed Admission: Completed [facility] Orientation: Patient has been orientated to the room, unit and staff.  Family:None at the bedside.  Orders have been reviewed and implemented. Will continue to monitor the patient. Call light has been placed within reach and bed alarm has been activated.   Wenden, Zenon Mayo, RN

## 2021-04-23 DIAGNOSIS — K56699 Other intestinal obstruction unspecified as to partial versus complete obstruction: Secondary | ICD-10-CM | POA: Diagnosis not present

## 2021-04-23 DIAGNOSIS — E43 Unspecified severe protein-calorie malnutrition: Secondary | ICD-10-CM | POA: Insufficient documentation

## 2021-04-23 DIAGNOSIS — K56609 Unspecified intestinal obstruction, unspecified as to partial versus complete obstruction: Secondary | ICD-10-CM | POA: Diagnosis not present

## 2021-04-23 DIAGNOSIS — R188 Other ascites: Secondary | ICD-10-CM | POA: Diagnosis not present

## 2021-04-23 LAB — CBC
HCT: 32.1 % — ABNORMAL LOW (ref 36.0–46.0)
Hemoglobin: 10.9 g/dL — ABNORMAL LOW (ref 12.0–15.0)
MCH: 31.1 pg (ref 26.0–34.0)
MCHC: 34 g/dL (ref 30.0–36.0)
MCV: 91.5 fL (ref 80.0–100.0)
Platelets: 413 10*3/uL — ABNORMAL HIGH (ref 150–400)
RBC: 3.51 MIL/uL — ABNORMAL LOW (ref 3.87–5.11)
RDW: 13.1 % (ref 11.5–15.5)
WBC: 9.7 10*3/uL (ref 4.0–10.5)
nRBC: 0 % (ref 0.0–0.2)

## 2021-04-23 LAB — BASIC METABOLIC PANEL
Anion gap: 11 (ref 5–15)
BUN: 18 mg/dL (ref 8–23)
CO2: 22 mmol/L (ref 22–32)
Calcium: 9 mg/dL (ref 8.9–10.3)
Chloride: 102 mmol/L (ref 98–111)
Creatinine, Ser: 1.41 mg/dL — ABNORMAL HIGH (ref 0.44–1.00)
GFR, Estimated: 41 mL/min — ABNORMAL LOW (ref >60)
Glucose, Bld: 92 mg/dL (ref 70–99)
Potassium: 3.7 mmol/L (ref 3.5–5.1)
Sodium: 135 mmol/L (ref 135–145)

## 2021-04-23 MED ORDER — ADULT MULTIVITAMIN W/MINERALS CH
1.0000 | ORAL_TABLET | Freq: Every day | ORAL | Status: DC
  Administered 2021-04-24 – 2021-04-29 (×5): 1 via ORAL
  Filled 2021-04-23 (×6): qty 1

## 2021-04-23 MED ORDER — BOOST / RESOURCE BREEZE PO LIQD CUSTOM
1.0000 | Freq: Three times a day (TID) | ORAL | Status: DC
Start: 2021-04-23 — End: 2021-04-28
  Administered 2021-04-23 – 2021-04-28 (×6): 1 via ORAL

## 2021-04-23 MED ORDER — PROSOURCE PLUS PO LIQD
30.0000 mL | Freq: Three times a day (TID) | ORAL | Status: DC
  Administered 2021-04-24 – 2021-04-28 (×5): 30 mL via ORAL
  Filled 2021-04-23 (×9): qty 30

## 2021-04-23 MED ORDER — SODIUM CHLORIDE 0.9 % IV SOLN
10.0000 mg | Freq: Once | INTRAVENOUS | Status: AC | PRN
  Administered 2021-04-23: 10 mg via INTRAVENOUS
  Filled 2021-04-23: qty 1

## 2021-04-23 NOTE — Progress Notes (Addendum)
[facility] Progress Note  Patient: Brenda Stewart    [id number]  DOA: 04/21/2021     Date of Service: the patient was seen and examined on 04/23/2021  Brief hospital course: Past medical history of HTN, GERD.  Presents with complaints of abdominal pain found to have small bowel obstruction as well as potential for peritoneal carcinomatosis with unknown primary. Currently plan is continue conservative measures and further work-up.  Subjective: Has nausea no vomiting.  No fever no chills.  No chest pain.  No abdominal pain.  No BM but passing gas.  Assessment and Plan: SBO (small bowel obstruction) Currently no NG tube inserted. No significant vomiting.  No nausea as well. Continue antiemetic and IV fluids. Pain controlled. General surgery following.  Diet advanced.   AKI (acute kidney injury) Baseline serum creatinine normal. On presentation serum creatinine 2.57. Currently improved to 1.4.  Continue IV fluids. Monitor.   Ascites Ultrasound paracentesis consults requested although patient does not have any significant ascites on ultrasound. Will monitor and reattempt   Unintended weight loss Peritoneal carcinomatosis. Anorexia. Pt with 30 pound weight loss over past 8 months she states. Pt with suspected peritoneal carcinomatosis on CT scan.  Appreciate oncology consultation. Tumor markers consulted. Outpatient follow-up requested as well. Ultrasound paracentesis requested for biopsy.  If not successful patient will require an omental biopsy.  Hyponatremia Mild. Recheck electrolytes in am    Prolonged QT interval Junctional rhythm with tachycardia Continue monitor on telemetry. QT is slightly resolved.   Leukocytosis No signs of infection.  WBC is mildly elevated.  Most likely left shift from persistent nausea and vomiting   Thrombocytosis Recheck CBC in am. Heparin for DVT prophylaxis provided.    Scheduled Meds:  (feeding supplement) PROSource Plus  30  mL Oral TID BM   feeding supplement  1 Container Oral TID BM   heparin  5,000 Units Subcutaneous Q8H   multivitamin with minerals  1 tablet Oral Daily   Continuous Infusions:  lactated ringers 100 mL/hr at 04/23/21 1806   PRN Meds: acetaminophen **OR** acetaminophen, HYDROmorphone (DILAUDID) injection, ondansetron (ZOFRAN) IV, trimethobenzamide  Body mass index is 26 kg/m.  Nutrition Problem: Severe Malnutrition Etiology: chronic illness (SBO likely due to carcinomatosis)     DVT Prophylaxis:   heparin injection 5,000 Units Start: 04/21/21 2200    Advance goals of care discussion: Pt is Full code.  Family Communication: NO family was present at bedside, at the time of interview.   Data Reviewed: I have personally reviewed and interpreted daily labs, tele strips, imaging. Sodium stable.  WBC improving.  Hemoglobin stable.  Physical Exam:  General: Appear in mild distress, no Rash; Oral Mucosa Clear, moist. no Abnormal Neck Mass Or lumps, Conjunctiva normal  Cardiovascular: S1 and S2 Present, no Murmur, Respiratory: good respiratory effort, Bilateral Air entry present and CTA, no Crackles, no wheezes Abdomen: Bowel Sound present, Soft and mild tenderness Extremities: no Pedal edema Neurology: alert and oriented to time, place, and person affect appropriate. no new focal deficit Gait not checked due to patient safety concerns   Vitals:   04/22/21 1400 04/22/21 1523 04/23/21 1051 04/23/21 1856  BP: 114/79 (!) 119/94 119/90 124/83  Pulse: 80 89 (!) 102 98  Resp: '18 17 20 18  '$ Temp: 97.6 F (36.4 C) 97.6 F (36.4 C) 98.1 F (36.7 C) 98.5 F (36.9 C)  TempSrc: Oral Oral Oral Oral  SpO2: 93% 96% 93% (!) 83%  Weight:      Height:  Disposition:  Status is: Inpatient  Remains inpatient appropriate because:IV treatments appropriate due to intensity of illness or inability to take PO  Dispo: The patient is from: Home              Anticipated d/c is to: Home               Patient currently is not medically stable to d/c.   Difficult to place patient No  Time spent: 35 minutes. I reviewed all nursing notes, pharmacy notes, vitals, pertinent old records. I have discussed plan of care as described above with RN.  Author: Berle Mull, MD [facility] 04/23/2021 8:02 PM  To reach On-call, see care teams to locate the attending and reach out via [email or web address]. Between 7PM-7AM, please contact night-coverage If you still have difficulty reaching the attending provider, please page the [facility] (Director on Call) for [facility] on amion for assistance.

## 2021-04-23 NOTE — Progress Notes (Signed)
Patient ID: Brenda Stewart, female   DOB: 14-Jun-1951, 70 y.o.   MRN: [id number]     Subjective: No further emesis.  Some flatus.  Mild nausea overnight secondary to medication that was administered.  Patient has only been drinking liquids at home for some time  ROS negative except as listed above. Objective: Vital signs in last 24 hours: Temp:  [97.6 F (36.4 C)] 97.6 F (36.4 C) (07/31 1523) Pulse Rate:  [79-96] 89 (07/31 1523) Resp:  [13-27] 17 (07/31 1523) BP: (114-123)/(71-96) 119/94 (07/31 1523) SpO2:  [92 %-96 %] 96 % (07/31 1523)    Intake/Output from previous day: 07/31 0701 - 08/01 0700 In: 2224.2 [I.V.:2224.2] Out: -  Intake/Output this shift: No intake/output data recorded.  PE: Abd: soft, minimally tender, some BS, nondistended  Lab Results: CBC  Recent Labs    04/21/21 1413 04/22/21 0509  WBC 13.0* 10.8*  HGB 12.8 11.3*  HCT 38.6 33.9*  PLT 535* 445*   BMET Recent Labs    04/21/21 1413 04/22/21 0509  NA 132* 135  K 3.8 3.9  CL 94* 99  CO2 23 23  GLUCOSE 110* 104*  BUN 17 21  CREATININE 2.57* 2.35*  CALCIUM 9.5 9.0   PT/INR No results for input(s): LABPROT, INR in the last 72 hours. ABG No results for input(s): PHART, HCO3 in the last 72 hours.  Invalid input(s): PCO2, PO2  Studies/Results: CT ABDOMEN PELVIS WO CONTRAST  Result Date: 04/21/2021 CLINICAL DATA:  Nausea and vomiting for 2 days. Lower abdominal pain for approximately 3 months. Chronic constipation. EXAM: CT ABDOMEN AND PELVIS WITHOUT CONTRAST TECHNIQUE: Multidetector CT imaging of the abdomen and pelvis was performed following the standard protocol without IV contrast. COMPARISON:  None. FINDINGS: Lower chest: No acute findings. Hepatobiliary: No mass visualized on this unenhanced exam. Gallbladder is unremarkable. No evidence of biliary ductal dilatation. Pancreas: No mass or inflammatory process visualized on this unenhanced exam. Spleen:  Within normal limits in size.  Adrenals/Urinary tract: No evidence of urolithiasis or hydronephrosis. Unremarkable unopacified urinary bladder. Stomach/Bowel: Small hiatal hernia is seen containing ascites. Moderate diffuse small bowel dilatation is seen, with suspected transition point in the right lower quadrant, suspicious for distal small bowel obstruction. Vascular/Lymphatic: No pathologically enlarged lymph nodes identified. No evidence of abdominal aortic aneurysm. Aortic atherosclerotic calcification noted. Reproductive: Prior hysterectomy suspected. Moderate ascites is seen. Omental caking and diffuse mesenteric soft tissue stranding is seen. There is also probable peritoneal nodularity along the left pelvic sidewall. These findings are highly suspicious for peritoneal carcinomatosis. Other:  None. Musculoskeletal:  No suspicious bone lesions identified. 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 Suspected distal small bowel obstruction, with probable transition point in right lower quadrant. Aortic Atherosclerosis (ICD10-I70.0). Electronically Signed   By: Marlaine Hind M.D.   On: 04/21/2021 18:36   US Abdomen Limited  Result Date: 04/22/2021 CLINICAL DATA:  Peritoneal neoplasm abdominal distension, recurrent ascites EXAM: LIMITED ABDOMEN ULTRASOUND FOR ASCITES TECHNIQUE: Limited ultrasound survey for ascites was performed in all four abdominal quadrants. COMPARISON:  04/21/2021 CT FINDINGS: Survey of the abdominal 4 quadrants demonstrates a small volume of lower abdominal ascites by ultrasound. Unable to perform paracentesis because of intractable nausea vomiting today. IMPRESSION: Small volume of lower abdominopelvic ascites. Will reassess for paracentesis tomorrow 04/23/2021. Electronically Signed   By: Jerilynn Mages.  Shick M.D.   On: 04/22/2021 12:09   DG Abd Portable 1V-Small Bowel Obstruction Protocol-initial, 8 hr delay  Result Date: 04/22/2021 CLINICAL DATA:  Small bowel  obstruction, 8 hour film.  EXAM: PORTABLE ABDOMEN - 1 VIEW COMPARISON:  CT yesterday. FINDINGS: Administered enteric contrast has reached the ascending and proximal transverse colon. There is enteric contrast within prominent small bowel in the central abdomen. Left upper quadrant contrast may be residual within small bowel or at the splenic flexure. No evidence of free air. IMPRESSION: Administered enteric contrast has reached the ascending and proximal transverse colon. There is enteric contrast within prominent small bowel in the central abdomen. Left upper quadrant contrast may be residual within small bowel or at the splenic flexure. Electronically Signed   By: Keith Rake M.D.   On: 04/22/2021 17:38    Anti-infectives: Anti-infectives (From admission, onward)    None       Assessment/Plan: SBO likely due to carcinomatosis - SBO protocol, with contrast in colon -will adv to CLD.  Patient was only taking liquid diet at home for weeks apparently due to inability to tolerating much solid food. -likely extrinsic compression on SB secondary to carcinomatosis. -IR to re-eval for para today.  If this is unsuccessful or cytology negative could ask IR to eval CT scan to see if there is anything soft tissue that could be biopsied to help determine etiology of her findings.   -no acute plans for surgical intervention  FEN - CLD VTE - heparin ID- none needed  HTN GERD   LOS: 2 days    Henreitta Cea PA-C [facility] General Surgery Use [email or web address] to contact on call provider  04/23/2021

## 2021-04-23 NOTE — Progress Notes (Signed)
Subjective: The patient is seen and examined today.  She is feeling fine except for fatigue and dull aching pain in the abdomen with occasional nausea and persistent constipation.  She was supposed to have ultrasound-guided paracentesis yesterday but she could not tolerate the procedure and they will try again today.  She has no fever or chills.  Objective: Vital signs in last 24 hours: Temp:  [97.6 F (36.4 C)] 97.6 F (36.4 C) (07/31 1523) Pulse Rate:  [79-96] 89 (07/31 1523) Resp:  [13-27] 17 (07/31 1523) BP: (114-123)/(71-96) 119/94 (07/31 1523) SpO2:  [92 %-96 %] 96 % (07/31 1523)  Intake/Output from previous day: 07/31 0701 - 08/01 0700 In: 2224.2 [I.V.:2224.2] Out: -  Intake/Output this shift: No intake/output data recorded.  General appearance: alert, cooperative, fatigued, and no distress Resp: clear to auscultation bilaterally Cardio: regular rate and rhythm, S1, S2 normal, no murmur, click, rub or gallop GI: abnormal findings:  distended and hypoactive bowel sounds Extremities: extremities normal, atraumatic, no cyanosis or edema  Lab Results:  Recent Labs    04/21/21 1413 04/22/21 0509  WBC 13.0* 10.8*  HGB 12.8 11.3*  HCT 38.6 33.9*  PLT 535* 445*   BMET Recent Labs    04/21/21 1413 04/22/21 0509  NA 132* 135  K 3.8 3.9  CL 94* 99  CO2 23 23  GLUCOSE 110* 104*  BUN 17 21  CREATININE 2.57* 2.35*  CALCIUM 9.5 9.0    Studies/Results: CT ABDOMEN PELVIS WO CONTRAST  Result Date: 04/21/2021 CLINICAL DATA:  Nausea and vomiting for 2 days. Lower abdominal pain for approximately 3 months. Chronic constipation. EXAM: CT ABDOMEN AND PELVIS WITHOUT CONTRAST TECHNIQUE: Multidetector CT imaging of the abdomen and pelvis was performed following the standard protocol without IV contrast. COMPARISON:  None. FINDINGS: Lower chest: No acute findings. Hepatobiliary: No mass visualized on this unenhanced exam. Gallbladder is unremarkable. No evidence of biliary ductal  dilatation. Pancreas: No mass or inflammatory process visualized on this unenhanced exam. Spleen:  Within normal limits in size. Adrenals/Urinary tract: No evidence of urolithiasis or hydronephrosis. Unremarkable unopacified urinary bladder. Stomach/Bowel: Small hiatal hernia is seen containing ascites. Moderate diffuse small bowel dilatation is seen, with suspected transition point in the right lower quadrant, suspicious for distal small bowel obstruction. Vascular/Lymphatic: No pathologically enlarged lymph nodes identified. No evidence of abdominal aortic aneurysm. Aortic atherosclerotic calcification noted. Reproductive: Prior hysterectomy suspected. Moderate ascites is seen. Omental caking and diffuse mesenteric soft tissue stranding is seen. There is also probable peritoneal nodularity along the left pelvic sidewall. These findings are highly suspicious for peritoneal carcinomatosis. Other:  None. Musculoskeletal:  No suspicious bone lesions identified. 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 Suspected distal small bowel obstruction, with probable transition point in right lower quadrant. Aortic Atherosclerosis (ICD10-I70.0). Electronically Signed   By: Marlaine Hind M.D.   On: 04/21/2021 18:36   US Abdomen Limited  Result Date: 04/22/2021 CLINICAL DATA:  Peritoneal neoplasm abdominal distension, recurrent ascites EXAM: LIMITED ABDOMEN ULTRASOUND FOR ASCITES TECHNIQUE: Limited ultrasound survey for ascites was performed in all four abdominal quadrants. COMPARISON:  04/21/2021 CT FINDINGS: Survey of the abdominal 4 quadrants demonstrates a small volume of lower abdominal ascites by ultrasound. Unable to perform paracentesis because of intractable nausea vomiting today. IMPRESSION: Small volume of lower abdominopelvic ascites. Will reassess for paracentesis tomorrow 04/23/2021. Electronically Signed   By: Jerilynn Mages.  Shick M.D.   On: 04/22/2021 12:09   DG Abd Portable 1V-Small  Bowel Obstruction Protocol-initial, 8 hr delay  Result Date: 04/22/2021 CLINICAL DATA:  Small bowel obstruction, 8 hour film. EXAM: PORTABLE ABDOMEN - 1 VIEW COMPARISON:  CT yesterday. FINDINGS: Administered enteric contrast has reached the ascending and proximal transverse colon. There is enteric contrast within prominent small bowel in the central abdomen. Left upper quadrant contrast may be residual within small bowel or at the splenic flexure. No evidence of free air. IMPRESSION: Administered enteric contrast has reached the ascending and proximal transverse colon. There is enteric contrast within prominent small bowel in the central abdomen. Left upper quadrant contrast may be residual within small bowel or at the splenic flexure. Electronically Signed   By: Keith Rake M.D.   On: 04/22/2021 17:38    Medications: I have reviewed the patient's current medications.    Assessment/Plan: This is a very pleasant 70 years old African-American female presented with suspicious diffuse peritoneal carcinomatosis with distal small bowel obstruction.  This is suspicious for malignancy either from gastrointestinal or gynecologic primary. I order several tumor markers but unfortunately they are still pending.  I also order ultrasound-guided paracentesis but the patient could not tolerate the procedure yesterday and they will try again today. Will continue with supportive care for the small bowel obstruction and she is followed by surgery. Once the cytology from the fluid becomes available and if it is positive for malignancy, we will be able to provide further recommendation regarding management of her condition.  If the cytology of the fluid is negative, the patient will need a tissue biopsy either with exploratory laparotomy or needle aspiration of one of the metastatic peritoneal lesions. Thank you for taking good care of Ms. Wormley, I will continue to follow-up the patient with you and assist in her  management on as-needed basis.  LOS: 2 days    Eilleen Kempf 04/23/2021

## 2021-04-23 NOTE — Progress Notes (Signed)
Initial Nutrition Assessment  DOCUMENTATION CODES:  Severe malnutrition in context of chronic illness  INTERVENTION:  Add Boost Breeze po TID, each supplement provides 250 kcal and 9 grams of protein.  Add 45 ml ProSource Plus po TID, each supplement provides 100 kcal and 15 grams of protein.   Add MVI with minerals daily.  Once diet advances to full liquids, add: Vital Cuisine Shake TID, each supplement provides 520 kcal and 22 grams of protein. Magic cup TID with meals, each supplement provides 290 kcal and 9 grams of protein.  If pt's diet does not advance past full liquids within the next 48 hrs, attempt to place Cortrak and start trickle tube feedings of Osmolite 1.5 @ 20 ml/hr.  If the above is not possible, recommend starting placing PICC line and starting TPN per Pharmacy.  NUTRITION DIAGNOSIS:  Severe Malnutrition related to chronic illness (SBO likely due to carcinomatosis) as evidenced by energy intake < or equal to 75% for > or equal to 1 month, moderate fat depletion, moderate muscle depletion.  GOAL:  Patient will meet greater than or equal to 90% of their needs  MONITOR:  Diet advancement, PO intake, Supplement acceptance, Labs, Weight trends, I & O's  REASON FOR ASSESSMENT:  Malnutrition Screening Tool    ASSESSMENT:  70 yo female with a PMH of HTN and GERD who presents with SBO likely due to carcinomatosis. Pt with stated 30 pound weight loss over past 8 months. 7/31 - attempted paracentesis - unable to perform due to nonstop vomiting (rescheduled for 8/1) 8/1 - advanced to clear liquids; plan for paracentesis today  Spoke with pt at bedside. Pt in good spirits. She reports that she has been having difficulty swallowing, not from pain, but her body "rejects" the food, in her words. She reports this has been occurring since April 2022. She also reports that she can take liquids really well and will do anything to avoid a tube, but pt willing to do what she has to  do if PO is not tolerated.  Pt unable to receive NGT, as provider was unable to advance it enough. Pt does not have one in currently.  Ultrasound paracentesis consults requested although patient does not have any significant ascites on ultrasound.  Pt reports that she weighed 191 lbs in 06/2020 and 04/11/21, she weighed 174.5 lbs. This is a 16.5 lb (8.6%) weight loss. This is not necessarily significant for the time frame. Pt with no weight history in Epic or Care Everywhere.  On exam, pt with moderate depletions.  Pt with severe chronic malnutrition given extremely poor PO intake.  Recommend adding Boost Breeze TID and ProSource Plus TID to promote caloric and protein intake, as well as MVI with minerals. Once diet advances, add Hormel shakes TID and Magic Cup TID.  Medications: reviewed; LR @ 100 ml/hr via IV  Labs: reviewed  NUTRITION - FOCUSED PHYSICAL EXAM: Flowsheet Row Most Recent Value  Orbital Region Moderate depletion  Upper Arm Region Moderate depletion  Thoracic and Lumbar Region No depletion  Buccal Region Mild depletion  Temple Region Severe depletion  Clavicle Bone Region Moderate depletion  Clavicle and Acromion Bone Region Moderate depletion  Scapular Bone Region Unable to assess  Dorsal Hand Moderate depletion  Patellar Region Mild depletion  Anterior Thigh Region Moderate depletion  Posterior Calf Region Moderate depletion  Edema (RD Assessment) None  Hair Reviewed  Eyes Reviewed  Mouth Reviewed  Skin Reviewed  Nails Reviewed   Diet Order:  Diet Order             Diet clear liquid Room service appropriate? Yes; Fluid consistency: Thin  Diet effective now                  EDUCATION NEEDS:  Education needs have been addressed  Skin:  Skin Assessment: Reviewed RN Assessment  Last BM:  unknown  Height:  Ht Readings from Last 1 Encounters:  04/21/21 '5\' 8"'$  (1.727 m)   Weight:  Wt Readings from Last 1 Encounters:  04/21/21 77.6 kg   BMI:   Body mass index is 26 kg/m.  Estimated Nutritional Needs:  Kcal:  1900-2100 Protein:  85-100 grams Fluid:  >1.9 L  Derrel Nip, RD, LDN (she/her/hers) Registered Dietitian I After-Hours/Weekend Pager # in [place]

## 2021-04-23 NOTE — Care Management Important Message (Signed)
Important Message  Patient Details  Name: Brenda Stewart MRN: [id number] Date of Birth: Mar 23, 1951   Medicare Important Message Given:  Yes     Orbie Pyo 04/23/2021, 4:40 PM

## 2021-04-24 ENCOUNTER — Inpatient Hospital Stay (HOSPITAL_COMMUNITY): Payer: Medicare Other

## 2021-04-24 DIAGNOSIS — K56609 Unspecified intestinal obstruction, unspecified as to partial versus complete obstruction: Secondary | ICD-10-CM

## 2021-04-24 HISTORY — PX: IR PARACENTESIS: IMG2679

## 2021-04-24 LAB — CBC
HCT: 32.8 % — ABNORMAL LOW (ref 36.0–46.0)
Hemoglobin: 11 g/dL — ABNORMAL LOW (ref 12.0–15.0)
MCH: 30.7 pg (ref 26.0–34.0)
MCHC: 33.5 g/dL (ref 30.0–36.0)
MCV: 91.6 fL (ref 80.0–100.0)
Platelets: 396 10*3/uL (ref 150–400)
RBC: 3.58 MIL/uL — ABNORMAL LOW (ref 3.87–5.11)
RDW: 13.1 % (ref 11.5–15.5)
WBC: 9.3 10*3/uL (ref 4.0–10.5)
nRBC: 0 % (ref 0.0–0.2)

## 2021-04-24 LAB — CA 125: Cancer Antigen (CA) 125: 137 U/mL — ABNORMAL HIGH (ref 0.0–38.1)

## 2021-04-24 LAB — BODY FLUID CELL COUNT WITH DIFFERENTIAL
Eos, Fluid: 0 %
Lymphs, Fluid: 92 %
Monocyte-Macrophage-Serous Fluid: 1 % — ABNORMAL LOW (ref 50–90)
Neutrophil Count, Fluid: 6 % (ref 0–25)
Other Cells, Fluid: 1 %
Total Nucleated Cell Count, Fluid: 360 cu mm (ref 0–1000)

## 2021-04-24 LAB — BASIC METABOLIC PANEL
Anion gap: 12 (ref 5–15)
BUN: 16 mg/dL (ref 8–23)
CO2: 24 mmol/L (ref 22–32)
Calcium: 9.1 mg/dL (ref 8.9–10.3)
Chloride: 99 mmol/L (ref 98–111)
Creatinine, Ser: 1.26 mg/dL — ABNORMAL HIGH (ref 0.44–1.00)
GFR, Estimated: 47 mL/min — ABNORMAL LOW (ref >60)
Glucose, Bld: 105 mg/dL — ABNORMAL HIGH (ref 70–99)
Potassium: 4 mmol/L (ref 3.5–5.1)
Sodium: 135 mmol/L (ref 135–145)

## 2021-04-24 LAB — CEA: CEA: 5.7 ng/mL — ABNORMAL HIGH (ref 0.0–4.7)

## 2021-04-24 LAB — ALBUMIN, PLEURAL OR PERITONEAL FLUID: Albumin, Fluid: 2.7 g/dL

## 2021-04-24 LAB — LACTATE DEHYDROGENASE, PLEURAL OR PERITONEAL FLUID: LD, Fluid: 137 U/L — ABNORMAL HIGH (ref 3–23)

## 2021-04-24 LAB — CANCER ANTIGEN 19-9: CA 19-9: 11 U/mL (ref 0–35)

## 2021-04-24 LAB — PROTEIN, PLEURAL OR PERITONEAL FLUID: Total protein, fluid: 5.1 g/dL

## 2021-04-24 MED ORDER — LIDOCAINE HCL 1 % IJ SOLN
INTRAMUSCULAR | Status: AC | PRN
Start: 2021-04-24 — End: 2021-04-24
  Administered 2021-04-24: 10 mL

## 2021-04-24 MED ORDER — ALBUMIN HUMAN 25 % IV SOLN
25.0000 g | Freq: Once | INTRAVENOUS | Status: AC
  Administered 2021-04-24: 25 g via INTRAVENOUS
  Filled 2021-04-24: qty 100

## 2021-04-24 MED ORDER — LIDOCAINE HCL 1 % IJ SOLN
INTRAMUSCULAR | Status: AC
  Filled 2021-04-24: qty 20

## 2021-04-24 NOTE — Progress Notes (Signed)
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 The intent is of curative intent.  We discussed some of the risks, benefits, side-effects of carboplatin & Taxol. Treatment is intravenous, every 3 weeks x 6 cycles, with CT imaging to be performed after cycle 3 for possible interval debulking surgery  Some of the short term side-effects included, though not limited to, including weight loss, life threatening infections, risk of allergic reactions, need for transfusions of blood products, nausea, vomiting, change in bowel habits, loss of hair, admission to hospital for various reasons, and risks of death.   Long term side-effects are also discussed including risks of infertility, permanent damage to nerve function, hearing loss, chronic fatigue, kidney damage with possibility needing hemodialysis, and rare secondary malignancy including bone marrow disorders.  The patient is aware that the response rates discussed earlier is not guaranteed.  After a long discussion, patient made an informed decision to proceed with the prescribed plan of care.   I recommend port placement I recommend transferring her to 6 E. oncology unit at [facility] in anticipation for inpatient  chemotherapy, within the next 48 hours as soon as we have biopsy result back In the meantime, I recommend continue supportive care  Malignant ascites She will likely have reaccumulation of ascites with aggressive IV fluid hydration Since her acute renal failure is improving, I recommend reducing IV fluid support to 50 cc an hour and she is in agreement  Uncontrolled nausea and vomiting Due to abdominal carcinomatosis This is not likely going to get better without treatment Continue antiemetics as needed  Acute renal failure Due to dehydration and carcinomatosis It is improving I recommend reduce IV fluids as above  Constipation Continue conservative approach Am concerned about risk of bowel perforation with laxative She is having daily bowel movement right now  Code Status Full code  Goals of care Resolution of bowel obstruction, ability to tolerate oral intake and resolution of nausea  Discharge planning Anticipate transfer to [place] to start neoadjuvant chemotherapy I anticipate she will be in the hospital 5 to 7 days  All questions were answered. The patient knows to call the clinic with any problems, questions or concerns.   The total time spent in the appointment was 80 minutes encounter with patients including review of chart and various tests results, discussions about plan of care and coordination of care plan  Brenda Lark, MD 04/24/2021 3:49 PM  Subjective:  The patient was seen by Dr. Julien Nordmann She is admitted to the hospital due to bloating, nausea, vomiting, inability to take oral intake She has not been feeling well since April She has changes in bowel habits with very little small amount of bowel movement She is scheduled to have colonoscopy in the near future but was admitted due to findings of acute renal  failure and CT imaging show signs of peritoneal carcinomatosis CA125 is elevated She underwent paracentesis and biopsy, results pending Currently, she  still has significant sensation of nausea and vomiting with oral intake She has minimum pain She has strong family history of cancer She lives alone with an older gentleman.  Her daughter lives in [place].  She does not have a car and will need help for transportation in the future for treatment  Objective:  Vitals:   04/23/21 2147 04/24/21 0546  BP: 122/87 128/83  Pulse: 99 99  Resp: 18   Temp: 98.8 F (37.1 C) 98 F (36.7 C)  SpO2: 96% 100%     Intake/Output Summary (Last 24 hours) at 04/24/2021 1549 Last data filed at 04/24/2021 1500 Gross per 24 hour  Intake 1250 ml  Output --  Net 1250 ml    GENERAL:alert, no distress and comfortable SKIN: skin color, texture, turgor are normal, no rashes or significant lesions EYES: normal, Conjunctiva are pink and non-injected, sclera clear OROPHARYNX:no exudate, no erythema and lips, buccal mucosa, and tongue normal  NECK: supple, thyroid normal size, non-tender, without nodularity LYMPH:  no palpable lymphadenopathy in the cervical, axillary or inguinal LUNGS: clear to auscultation and percussion with normal breathing effort HEART: regular rate & rhythm and no murmurs and no lower extremity edema ABDOMEN:abdomen soft, distended, palpable fullness Musculoskeletal:no cyanosis of digits and no clubbing  NEURO: alert & oriented x 3 with fluent speech, no focal motor/sensory deficits   Labs:  Recent Labs    05/20/20 1032 05/20/20 1032 04/21/21 1413 04/22/21 0509 04/23/21 0825 04/24/21 0056  NA 142  --  132* 135 135 135  K 3.5  --  3.8 3.9 3.7 4.0  CL 104  --  94* 99 102 99  CO2 28  --  '23 23 22 24  '$ GLUCOSE 113*  --  110* 104* 92 105*  BUN 10  --  '17 21 18 16  '$ CREATININE 1.03*  --  2.57* 2.35* 1.41* 1.26*  CALCIUM 9.4  --  9.5 9.0 9.0 9.1  GFRNONAA 56*   < > 20* 22* 41* 47*  GFRAA >60  --   --   --   --   --   PROT 7.9  --  8.1  --   --   --   ALBUMIN 4.1  --  3.5  --   --   --   AST 20  --  12*  --   --   --   ALT 14  --   10  --   --   --   ALKPHOS 73  --  77  --   --   --   BILITOT 0.6  --  0.7  --   --   --    < > = values in this interval not displayed.    Studies: I have personally reviewed her CT imaging CT ABDOMEN PELVIS WO CONTRAST  Result Date: 04/21/2021 CLINICAL DATA:  Nausea and vomiting for 2 days. Lower abdominal pain for approximately 3 months. Chronic constipation. EXAM: CT ABDOMEN AND PELVIS WITHOUT CONTRAST TECHNIQUE: Multidetector CT imaging of the abdomen and pelvis was performed following the standard protocol without IV contrast. COMPARISON:  None. FINDINGS: Lower chest: No acute findings. Hepatobiliary: No mass visualized on this unenhanced exam. Gallbladder is unremarkable. No evidence of biliary ductal dilatation. Pancreas: No mass or inflammatory process visualized on this unenhanced exam. Spleen:  Within normal limits in size. Adrenals/Urinary tract:  No evidence of urolithiasis or hydronephrosis. Unremarkable unopacified urinary bladder. Stomach/Bowel: Small hiatal hernia is seen containing ascites. Moderate diffuse small bowel dilatation is seen, with suspected transition point in the right lower quadrant, suspicious for distal small bowel obstruction. Vascular/Lymphatic: No pathologically enlarged lymph nodes identified. No evidence of abdominal aortic aneurysm. Aortic atherosclerotic calcification noted. Reproductive: Prior hysterectomy suspected. Moderate ascites is seen. Omental caking and diffuse mesenteric soft tissue stranding is seen. There is also probable peritoneal nodularity along the left pelvic sidewall. These findings are highly suspicious for peritoneal carcinomatosis. Other:  None. Musculoskeletal:  No suspicious bone lesions identified. 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 Suspected distal small bowel obstruction, with probable transition point in right lower quadrant. Aortic Atherosclerosis (ICD10-I70.0). Electronically Signed   By: Marlaine Hind M.D.   On: 04/21/2021 18:36   US Abdomen Limited  Result Date: 04/22/2021 CLINICAL DATA:  Peritoneal neoplasm abdominal distension, recurrent ascites EXAM: LIMITED ABDOMEN ULTRASOUND FOR ASCITES TECHNIQUE: Limited ultrasound survey for ascites was performed in all four abdominal quadrants. COMPARISON:  04/21/2021 CT FINDINGS: Survey of the abdominal 4 quadrants demonstrates a small volume of lower abdominal ascites by ultrasound. Unable to perform paracentesis because of intractable nausea vomiting today. IMPRESSION: Small volume of lower abdominopelvic ascites. Will reassess for paracentesis tomorrow 04/23/2021. Electronically Signed   By: Jerilynn Mages.  Shick M.D.   On: 04/22/2021 12:09   DG Abd Portable 1V  Result Date: 04/24/2021 CLINICAL DATA:  Small-bowel obstruction. EXAM: PORTABLE ABDOMEN - 1 VIEW COMPARISON:  04/22/2021.  CT 04/21/2021. FINDINGS: Mild-to-moderately distended loops of small bowel with a relative paucity of colonic gas again noted. Findings again consistent with small bowel obstruction. No free air. Ascites most likely present. Pelvic calcifications consistent phleboliths. Degenerative changes lumbar spine and both hips. IMPRESSION: Mild-to-moderate small-bowel distention with a paucity of intra colonic air again noted. Findings again consistent with small bowel obstruction. Ascites most likely present. Electronically Signed   By: Marcello Moores  Register   On: 04/24/2021 11:08   DG Abd Portable 1V-Small Bowel Obstruction Protocol-initial, 8 hr delay  Result Date: 04/22/2021 CLINICAL DATA:  Small bowel obstruction, 8 hour film. EXAM: PORTABLE ABDOMEN - 1 VIEW COMPARISON:  CT yesterday. FINDINGS: Administered enteric contrast has reached the ascending and proximal transverse colon. There is enteric contrast within prominent small bowel in the central abdomen. Left upper quadrant contrast may be residual within small bowel or at the splenic flexure. No evidence of free air. IMPRESSION:  Administered enteric contrast has reached the ascending and proximal transverse colon. There is enteric contrast within prominent small bowel in the central abdomen. Left upper quadrant contrast may be residual within small bowel or at the splenic flexure. Electronically Signed   By: Keith Rake M.D.   On: 04/22/2021 17:38

## 2021-04-24 NOTE — H&P (View-Only) (Signed)
Gynecologic Oncology Consultation  Brenda Stewart 70 y.o. female  HPI: Brenda Stewart is a 70 year old female, G1 P1, who presented to the ER on 04/21/21 for evaluation of abdominal pain that had been present since April 2022. Upon arrival to the ED, she reported chronic constipation and nausea/vomiting for 2 days. She had been seen previously at Urgent Care on 02/27/21 and at the [facility] for abdominal pain as well. She is scheduled for a CT and colonoscopy/EGD in August 2022.  In the ER on 04/21/21, CT imaging was obtained revealing diffuse peritoneal carcinomatosis and suspected distal small bowel obstruction. She was admitted with General Surgery consultation with plan for SBO protocol.      She reports an unintentional 20 pound weight loss since Oct 2021 but states she was eating normally up until April 2022. Her medical history includes GERD and hypertension. Her surgical history includes a partial hysterectomy in 2000 due to bleeding related to fibroids, hand tendon surgery, and bunionectomy. She reports having one abnormal pap smear in the past with repeat testing being normal.  Interval History: She reports feeling better after having the paracentesis today and reports no abdominal pain at this time. She had nausea and lower abdominal pain last pm but none today. She reports passing a small amount of flatus. No lightheadedness or weakness reported.  Review of Systems: Constitutional: Having hunger pains. Positive for unintentional weight loss. No fever, chills reported.  Cardiovascular: No chest pain, shortness of breath, or edema.  Pulmonary: No cough or wheeze.  Gastrointestinal: Positive for nausea/vomiting on admission. Intermittent diarrhea alternating with chronic constipation prior to admission. No bright red blood per rectum  Genitourinary: No frequency, urgency, or dysuria. No vaginal bleeding or discharge.  Musculoskeletal: No myalgia or joint pain. Neurologic: No weakness, numbness, or  change in gait.  Psychology: No depression, anxiety, or insomnia.  Health Maintenance: Mammogram: 2 yrs ago per patient Colonoscopy: Planned for August 2022 Care managed by the [facility]  Current Meds: Current medications reviewed.  Allergy: No Known Allergies  Social Hx:   Social History   Socioeconomic History   Marital status: Single    Spouse name: Not on file   Number of children: Not on file   Years of education: Not on file   Highest education level: Not on file  Occupational History   Not on file  Tobacco Use   Smoking status: Never   Smokeless tobacco: Never  Vaping Use   Vaping Use: Never used  Substance and Sexual Activity   Alcohol use: Yes   Drug use: No   Sexual activity: Not on file  Other Topics Concern   Not on file  Social History Narrative   Not on file   Social Determinants of Health   Financial Resource Strain: Not on file  Food Insecurity: Not on file  Transportation Needs: Not on file  Physical Activity: Not on file  Stress: Not on file  Social Connections: Not on file  Intimate Partner Violence: Not on file    Past Surgical Hx:  Past Surgical History:  Procedure Laterality Date   BUNIONECTOMY Bilateral    HAND TENDON SURGERY Bilateral    PARTIAL HYSTERECTOMY      Past Medical Hx:  Past Medical History:  Diagnosis Date   Acid reflux    Hypertension     Family Hx: History reviewed. No pertinent family history.  Vitals:  Blood pressure 128/83, pulse 99, temperature 98 F (36.7 C), temperature source Oral, resp.  rate 18, height '5\' 8"'$  (1.727 m), weight 169 lb 12.1 oz (77 kg), SpO2 100 %.  CBC    Component Value Date/Time   WBC 9.3 04/24/2021 0056   RBC 3.58 (L) 04/24/2021 0056   HGB 11.0 (L) 04/24/2021 0056   HCT 32.8 (L) 04/24/2021 0056   PLT 396 04/24/2021 0056   MCV 91.6 04/24/2021 0056   MCH 30.7 04/24/2021 0056   MCHC 33.5 04/24/2021 0056   RDW 13.1 04/24/2021 0056   LYMPHSABS 1.6 04/21/2021 1413   MONOABS 0.9  04/21/2021 1413   EOSABS 0.1 04/21/2021 1413   BASOSABS 0.0 04/21/2021 1413    BMET    Component Value Date/Time   NA 135 04/24/2021 0056   K 4.0 04/24/2021 0056   CL 99 04/24/2021 0056   CO2 24 04/24/2021 0056   GLUCOSE 105 (H) 04/24/2021 0056   BUN 16 04/24/2021 0056   CREATININE 1.26 (H) 04/24/2021 0056   CALCIUM 9.1 04/24/2021 0056   GFRNONAA 47 (L) 04/24/2021 0056   GFRAA >60 05/20/2020 1032    CA 125 is 137. CA 19-9 is 11. CEA is 5.7.  Physical Exam:  General: Well developed, well nourished female in no acute distress. Alert and oriented x 3.  Abdomen: Abdomen soft, non-tender.  Genitourinary: Deferred Extremities: No bilateral cyanosis, edema, or clubbing.    Assessment/Plan: 70 year old female currently admitted with suspected bowel obstruction and diffuse peritoneal carcinomatosis on CT imaging. Findings are suspicious for malignancy of a gynecologic or gastrointestinal source. Proceed with plans for paracentesis today. If non-diagnostic, recommendation for IR biopsy per Dr. Denman George. Continue plan of care per Hospitalist, Gen Surg, Med Onc. Contact information given to the patient and patient advised we will be seeing her as an outpatient with plans for treatment under the care of Dr. Heath Lark with Medical Oncology.   [name], NP 04/24/2021, 4:35 PM

## 2021-04-24 NOTE — Consult Note (Signed)
Gynecologic Oncology Consultation  Brenda Stewart 70 y.o. female  HPI: Brenda Stewart is a 70 year old female, G1 P1, who presented to the ER on 04/21/21 for evaluation of abdominal pain that had been present since April 2022. Upon arrival to the ED, she reported chronic constipation and nausea/vomiting for 2 days. She had been seen previously at Urgent Care on 02/27/21 and at the [facility] for abdominal pain as well. She is scheduled for a CT and colonoscopy/EGD in August 2022.  In the ER on 04/21/21, CT imaging was obtained revealing diffuse peritoneal carcinomatosis and suspected distal small bowel obstruction. She was admitted with General Surgery consultation with plan for SBO protocol.      She reports an unintentional 20 pound weight loss since Oct 2021 but states she was eating normally up until April 2022. Her medical history includes GERD and hypertension. Her surgical history includes a partial hysterectomy in 2000 due to bleeding related to fibroids, hand tendon surgery, and bunionectomy. She reports having one abnormal pap smear in the past with repeat testing being normal.  Interval History: She reports feeling better after having the paracentesis today and reports no abdominal pain at this time. She had nausea and lower abdominal pain last pm but none today. She reports passing a small amount of flatus. No lightheadedness or weakness reported.  Review of Systems: Constitutional: Having hunger pains. Positive for unintentional weight loss. No fever, chills reported.  Cardiovascular: No chest pain, shortness of breath, or edema.  Pulmonary: No cough or wheeze.  Gastrointestinal: Positive for nausea/vomiting on admission. Intermittent diarrhea alternating with chronic constipation prior to admission. No bright red blood per rectum  Genitourinary: No frequency, urgency, or dysuria. No vaginal bleeding or discharge.  Musculoskeletal: No myalgia or joint pain. Neurologic: No weakness, numbness, or  change in gait.  Psychology: No depression, anxiety, or insomnia.  Health Maintenance: Mammogram: 2 yrs ago per patient Colonoscopy: Planned for August 2022 Care managed by the [facility]  Current Meds: Current medications reviewed.  Allergy: No Known Allergies  Social Hx:   Social History   Socioeconomic History   Marital status: Single    Spouse name: Not on file   Number of children: Not on file   Years of education: Not on file   Highest education level: Not on file  Occupational History   Not on file  Tobacco Use   Smoking status: Never   Smokeless tobacco: Never  Vaping Use   Vaping Use: Never used  Substance and Sexual Activity   Alcohol use: Yes   Drug use: No   Sexual activity: Not on file  Other Topics Concern   Not on file  Social History Narrative   Not on file   Social Determinants of Health   Financial Resource Strain: Not on file  Food Insecurity: Not on file  Transportation Needs: Not on file  Physical Activity: Not on file  Stress: Not on file  Social Connections: Not on file  Intimate Partner Violence: Not on file    Past Surgical Hx:  Past Surgical History:  Procedure Laterality Date   BUNIONECTOMY Bilateral    HAND TENDON SURGERY Bilateral    PARTIAL HYSTERECTOMY      Past Medical Hx:  Past Medical History:  Diagnosis Date   Acid reflux    Hypertension     Family Hx: History reviewed. No pertinent family history.  Vitals:  Blood pressure 128/83, pulse 99, temperature 98 F (36.7 C), temperature source Oral, resp.  rate 18, height '5\' 8"'$  (1.727 m), weight 169 lb 12.1 oz (77 kg), SpO2 100 %.  CBC    Component Value Date/Time   WBC 9.3 04/24/2021 0056   RBC 3.58 (L) 04/24/2021 0056   HGB 11.0 (L) 04/24/2021 0056   HCT 32.8 (L) 04/24/2021 0056   PLT 396 04/24/2021 0056   MCV 91.6 04/24/2021 0056   MCH 30.7 04/24/2021 0056   MCHC 33.5 04/24/2021 0056   RDW 13.1 04/24/2021 0056   LYMPHSABS 1.6 04/21/2021 1413   MONOABS 0.9  04/21/2021 1413   EOSABS 0.1 04/21/2021 1413   BASOSABS 0.0 04/21/2021 1413    BMET    Component Value Date/Time   NA 135 04/24/2021 0056   K 4.0 04/24/2021 0056   CL 99 04/24/2021 0056   CO2 24 04/24/2021 0056   GLUCOSE 105 (H) 04/24/2021 0056   BUN 16 04/24/2021 0056   CREATININE 1.26 (H) 04/24/2021 0056   CALCIUM 9.1 04/24/2021 0056   GFRNONAA 47 (L) 04/24/2021 0056   GFRAA >60 05/20/2020 1032    CA 125 is 137. CA 19-9 is 11. CEA is 5.7.  Physical Exam:  General: Well developed, well nourished female in no acute distress. Alert and oriented x 3.  Abdomen: Abdomen soft, non-tender.  Genitourinary: Deferred Extremities: No bilateral cyanosis, edema, or clubbing.    Assessment/Plan: 70 year old female currently admitted with suspected bowel obstruction and diffuse peritoneal carcinomatosis on CT imaging. Findings are suspicious for malignancy of a gynecologic or gastrointestinal source. Proceed with plans for paracentesis today. If non-diagnostic, recommendation for IR biopsy per Dr. Denman George. Continue plan of care per Hospitalist, Gen Surg, Med Onc. Contact information given to the patient and patient advised we will be seeing her as an outpatient with plans for treatment under the care of Dr. Heath Lark with Medical Oncology.   [name], NP 04/24/2021, 4:35 PM

## 2021-04-24 NOTE — Progress Notes (Signed)
Patient ID: Brenda Stewart, female   DOB: Nov 05, 1950, 70 y.o.   MRN: [id number]     Subjective: Had some N/V yesterday with CLD, despite passing some flatus and contrast in her colon.  Having some intermittently abdominal pain  ROS negative except as listed above. Objective: Vital signs in last 24 hours: Temp:  [98 F (36.7 C)-98.8 F (37.1 C)] 98 F (36.7 C) (08/02 0546) Pulse Rate:  [98-102] 99 (08/02 0546) Resp:  [18-20] 18 (08/01 2147) BP: (119-128)/(83-90) 128/83 (08/02 0546) SpO2:  [83 %-100 %] 100 % (08/02 0546) Weight:  [77 kg] 77 kg (08/02 0546) Last BM Date: 04/24/21  Intake/Output from previous day: 08/01 0701 - 08/02 0700 In: 200 [P.O.:200] Out: -  Intake/Output this shift: No intake/output data recorded.  PE: Abd: soft, mildly tender, some BS, nondistended  Lab Results: CBC  Recent Labs    04/23/21 0825 04/24/21 0056  WBC 9.7 9.3  HGB 10.9* 11.0*  HCT 32.1* 32.8*  PLT 413* 396   BMET Recent Labs    04/23/21 0825 04/24/21 0056  NA 135 135  K 3.7 4.0  CL 102 99  CO2 22 24  GLUCOSE 92 105*  BUN 18 16  CREATININE 1.41* 1.26*  CALCIUM 9.0 9.1   PT/INR No results for input(s): LABPROT, INR in the last 72 hours. ABG No results for input(s): PHART, HCO3 in the last 72 hours.  Invalid input(s): PCO2, PO2  Studies/Results: US Abdomen Limited  Result Date: 04/22/2021 CLINICAL DATA:  Peritoneal neoplasm abdominal distension, recurrent ascites EXAM: LIMITED ABDOMEN ULTRASOUND FOR ASCITES TECHNIQUE: Limited ultrasound survey for ascites was performed in all four abdominal quadrants. COMPARISON:  04/21/2021 CT FINDINGS: Survey of the abdominal 4 quadrants demonstrates a small volume of lower abdominal ascites by ultrasound. Unable to perform paracentesis because of intractable nausea vomiting today. IMPRESSION: Small volume of lower abdominopelvic ascites. Will reassess for paracentesis tomorrow 04/23/2021. Electronically Signed   By: Jerilynn Mages.  Shick M.D.   On:  04/22/2021 12:09   DG Abd Portable 1V-Small Bowel Obstruction Protocol-initial, 8 hr delay  Result Date: 04/22/2021 CLINICAL DATA:  Small bowel obstruction, 8 hour film. EXAM: PORTABLE ABDOMEN - 1 VIEW COMPARISON:  CT yesterday. FINDINGS: Administered enteric contrast has reached the ascending and proximal transverse colon. There is enteric contrast within prominent small bowel in the central abdomen. Left upper quadrant contrast may be residual within small bowel or at the splenic flexure. No evidence of free air. IMPRESSION: Administered enteric contrast has reached the ascending and proximal transverse colon. There is enteric contrast within prominent small bowel in the central abdomen. Left upper quadrant contrast may be residual within small bowel or at the splenic flexure. Electronically Signed   By: Keith Rake M.D.   On: 04/22/2021 17:38    Anti-infectives: Anti-infectives (From admission, onward)    None       Assessment/Plan: SBO likely due to carcinomatosis - SBO protocol, with contrast in colon -this is likely a high-grade PSBO that may not improve given etiology.  Will decrease back to NPO today with ice/sips of clears from the floor and see how she does -likely extrinsic compression on SB secondary to carcinomatosis. -IR to re-eval for para today.  If this is unsuccessful or cytology negative could ask IR to eval CT scan to see if there is anything soft tissue that could be biopsied to help determine etiology of her findings.   -no acute plans for surgical intervention given suspect etiology as this is not  likely to help long-term as her risk for re-obstruction elsewhere is high.  FEN - NPO, sips/chiops VTE - heparin ID- none needed  HTN GERD   LOS: 3 days    Henreitta Cea PA-C Trauma & General Surgery Use [email or web address] to contact on call provider  04/24/2021

## 2021-04-24 NOTE — Procedures (Signed)
PROCEDURE SUMMARY:  Successful US guided paracentesis from LLQ.  Yielded 2.1 L of clear yellow fluid.  No immediate complications.  Pt tolerated well.   Specimen was sent for labs.  EBL < 30m  KAscencion DikePA-C 04/24/2021 12:34 PM

## 2021-04-24 NOTE — Progress Notes (Signed)
[facility] Progress Note  Patient: Brenda Stewart    [id number]  DOA: 04/21/2021     Date of Service: the patient was seen and examined on 04/24/2021  Brief hospital course: Past medical history of HTN, GERD.  Presents with complaints of abdominal pain found to have small bowel obstruction as well as potential for peritoneal carcinomatosis with unknown primary. Currently plan is continue conservative measures and further work-up carcinomatosis.  Transferred to [facility] for inpatient chemotherapy  Subjective: Intermittent nausea without any vomiting.  Passing gas.  No fever no chills.  No shortness of breath.  No chest pain.  Assessment and Plan: SBO (small bowel obstruction) Currently no NG tube inserted. 1 episode of vomiting yesterday without any blood.  Had some nausea.  Was on clear liquid diet yesterday and now back to n.p.o. Continue antiemetic and IV fluids. Pain controlled. General surgery following. Will requested general surgery to continue to follow at [facility].   AKI (acute kidney injury) Baseline serum creatinine normal. On presentation serum creatinine 2.57. Currently i improving with IV fluids.  Continue IV fluids. Monitor.   Ascites 8/two 2 L fluid removed. 1 dose of albumin given. Cytology and gram stain requested.   Unintended weight loss Peritoneal carcinomatosis. Anorexia. Pt with 30 pound weight loss over past 8 months she states. Pt with suspected peritoneal carcinomatosis on CT scan.  Appreciate oncology consultation. Tumor markers consulted. Outpatient follow-up requested as well. Ultrasound paracentesis, follow-up on cytology. Outpatient oncology consultation.  Patient will be transferred to [facility] for inpatient chemotherapy.  IR consulted for Port-A-Cath placement.  Oncology will notify 6 E.  Hyponatremia Resolved.   Prolonged QT interval Junctional rhythm with tachycardia Continue monitor on  telemetry. QT is slightly resolved.   Leukocytosis No signs of infection.  WBC is mildly elevated.  Most likely left shift from persistent nausea and vomiting   Thrombocytosis Resolved.  Severe protein calorie malnutrition. Anticipating improvement once small bowel obstruction is resolved.  Associated with cancer.  Continue nutritional supplement.   Scheduled Meds:  (feeding supplement) PROSource Plus  30 mL Oral TID BM   feeding supplement  1 Container Oral TID BM   heparin  5,000 Units Subcutaneous Q8H   lidocaine       multivitamin with minerals  1 tablet Oral Daily   Continuous Infusions:  lactated ringers 50 mL/hr at 04/24/21 1606   PRN Meds: acetaminophen **OR** acetaminophen, HYDROmorphone (DILAUDID) injection, lidocaine, ondansetron (ZOFRAN) IV, trimethobenzamide  Body mass index is 25.81 kg/m.  Nutrition Problem: Severe Malnutrition Etiology: chronic illness (SBO likely due to carcinomatosis)     DVT Prophylaxis:   heparin injection 5,000 Units Start: 04/21/21 2200    Advance goals of care discussion: Pt is Full code.  Family Communication: NO family was present at bedside, at the time of interview.   Data Reviewed: I have personally reviewed and interpreted daily labs, tele strips, imaging. BMP and CBC stable.  Hemoglobin stable.  Physical Exam:  General: Appear in mild distress, no Rash; Oral Mucosa Clear, moist. no Abnormal Neck Mass Or lumps, Conjunctiva normal  Cardiovascular: S1 and S2 Present, no Murmur, Respiratory: good respiratory effort, Bilateral Air entry present and CTA, no Crackles, no wheezes Abdomen: Bowel Sound present, Soft and no tenderness Extremities: no Pedal edema Neurology: alert and oriented to time, place, and person affect appropriate. no new focal deficit Gait not checked due to patient safety concerns   Vitals:   04/23/21 1856 04/23/21 2147 04/24/21 0546  04/24/21 1720  BP: 124/83 122/87 128/83 120/83  Pulse: 98 99 99 99   Resp: '18 18  18  '$ Temp: 98.5 F (36.9 C) 98.8 F (37.1 C) 98 F (36.7 C) 98 F (36.7 C)  TempSrc: Oral Oral Oral Oral  SpO2: (!) 83% 96% 100% 100%  Weight:   77 kg   Height:        Disposition:  Status is: Inpatient  Remains inpatient appropriate because:IV treatments appropriate due to intensity of illness or inability to take PO  Dispo: The patient is from: Home              Anticipated d/c is to: Home              Patient currently is not medically stable to d/c.   Difficult to place patient No  Time spent: 35 minutes. I reviewed all nursing notes, pharmacy notes, vitals, pertinent old records. I have discussed plan of care as described above with RN.  Author: Berle Mull, MD [facility] 04/24/2021 6:07 PM  To reach On-call, see care teams to locate the attending and reach out via [email or web address]. Between 7PM-7AM, please contact night-coverage If you still have difficulty reaching the attending provider, please page the [facility] (Director on Call) for [facility] on amion for assistance.

## 2021-04-25 ENCOUNTER — Other Ambulatory Visit: Payer: Self-pay | Admitting: Hematology and Oncology

## 2021-04-25 DIAGNOSIS — N179 Acute kidney failure, unspecified: Secondary | ICD-10-CM

## 2021-04-25 DIAGNOSIS — R180 Malignant ascites: Secondary | ICD-10-CM

## 2021-04-25 DIAGNOSIS — C786 Secondary malignant neoplasm of retroperitoneum and peritoneum: Secondary | ICD-10-CM | POA: Insufficient documentation

## 2021-04-25 DIAGNOSIS — E871 Hypo-osmolality and hyponatremia: Secondary | ICD-10-CM | POA: Diagnosis not present

## 2021-04-25 DIAGNOSIS — D490 Neoplasm of unspecified behavior of digestive system: Secondary | ICD-10-CM

## 2021-04-25 DIAGNOSIS — K56609 Unspecified intestinal obstruction, unspecified as to partial versus complete obstruction: Secondary | ICD-10-CM | POA: Diagnosis not present

## 2021-04-25 DIAGNOSIS — C482 Malignant neoplasm of peritoneum, unspecified: Secondary | ICD-10-CM

## 2021-04-25 NOTE — Progress Notes (Signed)
START ON PATHWAY REGIMEN - Ovarian     A cycle is every 21 days:     Paclitaxel      Carboplatin   **Always confirm dose/schedule in your pharmacy ordering system**  Patient Characteristics: Preoperative or Nonsurgical Candidate (Clinical Staging), Newly Diagnosed, Neoadjuvant Therapy followed by Surgery BRCA Mutation Status: Awaiting Test Results Therapeutic Status: Preoperative or Nonsurgical Candidate (Clinical Staging) AJCC T Category: cT3 AJCC 8 Stage Grouping: Unknown AJCC N Category: cN0 AJCC M Category: cM0 Therapy Plan: Neoadjuvant Therapy followed by Surgery Intent of Therapy: Curative Intent, Discussed with Patient 

## 2021-04-25 NOTE — Progress Notes (Signed)
Nutrition Follow-up  DOCUMENTATION CODES:  Severe malnutrition in context of chronic illness  INTERVENTION:  -Continue Boost Breeze PO TID -Continue 70m PROSource Plus PO TID -Continue MVI with minerals daily   Once diet advances to full liquids, add: -Vital Cuisine Shake TID, each supplement provides 520 kcal and 22 grams of protein -Magic cup TID with meals, each supplement provides 290 kcal and 9 grams of protein   If pt does not tolerate clear liquids/does not advance past full liquids within the next 48 hrs, recommend Cortrak placement and initiation of trickle tube feedings (Osmolite 1.5 @ 20 ml/hr).   NUTRITION DIAGNOSIS:  Severe Malnutrition related to chronic illness (SBO likely due to carcinomatosis) as evidenced by energy intake < or equal to 75% for > or equal to 1 month, moderate fat depletion, moderate muscle depletion. -- ongoing  GOAL:  Patient will meet greater than or equal to 90% of their needs -- progressing  MONITOR:  Diet advancement, PO intake, Supplement acceptance, Labs, Weight trends, I & O's  REASON FOR ASSESSMENT:  Malnutrition Screening Tool    ASSESSMENT:  70yo female with a PMH of HTN and GERD who presents with SBO likely due to carcinomatosis. Pt with stated 30 pound weight loss over past 8 months.  7/31 - attempted paracentesis - unable to perform due to nonstop vomiting (rescheduled for 8/1) 8/01 - advanced to clear liquids  8/02 - made NPO; s/p paracentesis, 2.1L yield 8/03 - diet advanced back to clear liquids  Pt still experiencing N/V yesterday while on clear liquid diet, though was passing some flatus. Endorsed some intermittent abdominal pain. Surgery evaluated pt and determined to make pt NPO again yesterday, concern for high-grade PSBO that may not improve given etiology unless it responds to chemotherapy. Today, pt reports nausea seems improved, no vomiting reported, having liquid stools. Pt on clear liquids again today, will attempt  oral nutrition supplements and assess for tolerance. If pt unable to tolerate clear liquids, nutrition support needs to be considered given pt is malnourished.   PO intake: 0% x 1 recorded meal  UOP: 2x unmeasured occurrences x24 hours I/O: +[id number] admit  Medications: PROSource Plus TID, Boost Breeze TID, MVI with minerals daily IVF: LR @ [id number] Labs: Cr 1.26 (H, trending down)  Diet Order:   Diet Order             Diet clear liquid Room service appropriate? Yes; Fluid consistency: Thin  Diet effective now                  EDUCATION NEEDS:  Education needs have been addressed  Skin:  Skin Assessment: Reviewed RN Assessment  Last BM:  8/2  Height:  Ht Readings from Last 1 Encounters:  04/21/21 '5\' 8"'$  (1.727 m)   Weight:  Wt Readings from Last 1 Encounters:  04/24/21 77 kg   BMI:  Body mass index is 25.81 kg/m.  Estimated Nutritional Needs:  Kcal:  1900-2100 Protein:  85-100 grams Fluid:  >1.9 L   AmaLarkin InaS, RD, LDN (she/her/hers) RD pager number and weekend/on-call pager number located in [place]

## 2021-04-25 NOTE — Progress Notes (Signed)
Patient ID: Brenda Stewart, female   DOB: 1950/11/29, 70 y.o.   MRN: [id number]     Subjective: Nausea seems improved today.  Having some liquid stool.  No vomiting yesterday but despite order was not given clears from the floor or ice chips  ROS negative except as listed above. Objective: Vital signs in last 24 hours: Temp:  [98 F (36.7 C)-98.5 F (36.9 C)] 98.2 F (36.8 C) (08/03 0416) Pulse Rate:  [99-104] 102 (08/03 0416) Resp:  [17-18] 17 (08/03 0416) BP: (120-129)/(83-90) 127/83 (08/03 0416) SpO2:  [92 %-100 %] 92 % (08/03 0416) Last BM Date: 04/24/21  Intake/Output from previous day: 08/02 0701 - 08/03 0700 In: 1050 [I.V.:1000; IV Piggyback:50] Out: -  Intake/Output this shift: No intake/output data recorded.  PE: Abd: soft, mildly tender, some BS, nondistended  Lab Results: CBC  Recent Labs    04/23/21 0825 04/24/21 0056  WBC 9.7 9.3  HGB 10.9* 11.0*  HCT 32.1* 32.8*  PLT 413* 396   BMET Recent Labs    04/23/21 0825 04/24/21 0056  NA 135 135  K 3.7 4.0  CL 102 99  CO2 22 24  GLUCOSE 92 105*  BUN 18 16  CREATININE 1.41* 1.26*  CALCIUM 9.0 9.1   PT/INR No results for input(s): LABPROT, INR in the last 72 hours. ABG No results for input(s): PHART, HCO3 in the last 72 hours.  Invalid input(s): PCO2, PO2  Studies/Results: DG Abd Portable 1V  Result Date: 04/24/2021 CLINICAL DATA:  Small-bowel obstruction. EXAM: PORTABLE ABDOMEN - 1 VIEW COMPARISON:  04/22/2021.  CT 04/21/2021. FINDINGS: Mild-to-moderately distended loops of small bowel with a relative paucity of colonic gas again noted. Findings again consistent with small bowel obstruction. No free air. Ascites most likely present. Pelvic calcifications consistent phleboliths. Degenerative changes lumbar spine and both hips. IMPRESSION: Mild-to-moderate small-bowel distention with a paucity of intra colonic air again noted. Findings again consistent with small bowel obstruction. Ascites most likely  present. Electronically Signed   By: Marcello Moores  Register   On: 04/24/2021 11:08   IR Paracentesis  Result Date: 04/24/2021 INDICATION: Abdominal distention secondary to ascites. Underlying concern for peritoneal carcinomatosis on imaging findings. Request for diagnostic and therapeutic paracentesis. EXAM: ULTRASOUND GUIDED LEFT LOWER QUADRANT PARACENTESIS MEDICATIONS: 1% plain lidocaine, 5 mL COMPLICATIONS: None immediate. PROCEDURE: Informed written consent was obtained from the patient after a discussion of the risks, benefits and alternatives to treatment. A timeout was performed prior to the initiation of the procedure. Initial ultrasound scanning demonstrates a large amount of ascites within the left lower abdominal quadrant. The left lower abdomen was prepped and draped in the usual sterile fashion. 1% lidocaine was used for local anesthesia. Following this, a 19 gauge, 7-cm, Yueh catheter was introduced. An ultrasound image was saved for documentation purposes. The paracentesis was performed. The catheter was removed and a dressing was applied. The patient tolerated the procedure well without immediate post procedural complication. FINDINGS: A total of approximately 2.1 L of clear yellow fluid was removed. Samples were sent to the laboratory as requested by the clinical team. IMPRESSION: Successful ultrasound-guided paracentesis yielding 2.1 liters of peritoneal fluid. Read by: Ascencion Dike PA-C Electronically Signed   By: Michaelle Birks MD   On: 04/24/2021 12:38    Anti-infectives: Anti-infectives (From admission, onward)    None       Assessment/Plan: SBO likely due to carcinomatosis - SBO protocol, with contrast in colon -this is likely a high-grade PSBO that may not improve given  etiology, unless it responds to chemotherapy. -likely extrinsic compression on SB secondary to carcinomatosis. -IR to re-eval for para today.  If this is unsuccessful or cytology negative could ask IR to eval CT  scan to see if there is anything soft tissue that could be biopsied to help determine etiology of her findings.   -no acute plans for general surgical intervention given suspected etiology as this is not likely to help long-term as her risk for re-obstruction elsewhere is high. -will retry CLD today and see how she does.  FEN - CLD VTE - heparin ID- none needed  HTN GERD   LOS: 4 days    Henreitta Cea PA-C Trauma & General Surgery Use [email or web address] to contact on call provider  04/25/2021

## 2021-04-25 NOTE — Progress Notes (Signed)
PROGRESS NOTE    Brenda Stewart   [id number]  DOB: Sep 01, 1951  DOA: 04/21/2021 PCP: Pcp, No   Brief Narrative:  Brenda Stewart is a 70 y/o F with HTN, GERD who presented with abdominal pain and was found to have SBO due to peritoneal carcinomatosis.   Subjective: Asking for ice chips and water. Having some watery stools.     Assessment & Plan:   Principal Problem:   SBO (small bowel obstruction)  - I have personally given her ice chips and water today - appreciate general surgery eval  Active Problems: Peritoneal carcinomatosis with ascites - 8/2> s/p paracentesis with 2.1 L clear fluid removed- albumin given - f/u path - appreciate oncology f/u - awaiting port a cath placement by IR    AKI (acute kidney injury),   Hyponatremia - prerenal - cont IVF    Prolonged QT interval - > 600 msec - repeat EKG    Unintended weight loss,  Protein-calorie malnutrition, severe - 30 lb weight loss   Thrombocytosis - improved to normal   Time spent in minutes: 35 DVT prophylaxis: heparin injection 5,000 Units Start: 04/21/21 2200  Code Status: full code  Family Communication:  Level of Care: Level of care: Med-Surg Disposition Plan:  Status is: Inpatient  Remains inpatient appropriate because:IV treatments appropriate due to intensity of illness or inability to take PO  Dispo: The patient is from: Home              Anticipated d/c is to: Home              Patient currently is not medically stable to d/c.   Difficult to place patient No      Consultants:  General surger Gyn onc Med onc Procedures:  IR paracentesis Antimicrobials:  Anti-infectives (From admission, onward)    None        Objective: Vitals:   04/24/21 1720 04/24/21 2011 04/25/21 0416 04/25/21 0825  BP: 120/83 129/90 127/83 (!) 132/91  Pulse: 99 (!) 104 (!) 102 96  Resp: '18 18 17 18  '$ Temp: 98 F (36.7 C) 98.5 F (36.9 C) 98.2 F (36.8 C) 98.2 F (36.8 C)  TempSrc: Oral Oral  Oral Oral  SpO2: 100% 94% 92% 94%  Weight:      Height:        Intake/Output Summary (Last 24 hours) at 04/25/2021 1555 Last data filed at 04/24/2021 2000 Gross per 24 hour  Intake 0 ml  Output --  Net 0 ml   Filed Weights   04/21/21 1704 04/24/21 0546  Weight: 77.6 kg 77 kg    Examination: General exam: Appears comfortable  HEENT: PERRLA, oral mucosa moist, no sclera icterus or thrush Respiratory system: Clear to auscultation. Respiratory effort normal. Cardiovascular system: S1 & S2 heard, RRR.   Gastrointestinal system: Abdomen soft, non-tender, nondistended. Normal bowel sounds. Central nervous system: Alert and oriented. No focal neurological deficits. Extremities: No cyanosis, clubbing or edema Skin: No rashes or ulcers Psychiatry:  Mood & affect appropriate.     Data Reviewed: I have personally reviewed following labs and imaging studies  CBC: Recent Labs  Lab 04/21/21 1413 04/22/21 0509 04/23/21 0825 04/24/21 0056  WBC 13.0* 10.8* 9.7 9.3  NEUTROABS 10.1*  --   --   --   HGB 12.8 11.3* 10.9* 11.0*  HCT 38.6 33.9* 32.1* 32.8*  MCV 92.8 92.6 91.5 91.6  PLT 535* 445* 413* [id number]   Basic Metabolic Panel: Recent Labs  Lab  04/21/21 1413 04/22/21 0509 04/23/21 0825 04/24/21 0056  NA 132* 135 135 135  K 3.8 3.9 3.7 4.0  CL 94* 99 102 99  CO2 '23 23 22 24  '$ GLUCOSE 110* 104* 92 105*  BUN '17 21 18 16  '$ CREATININE 2.57* 2.35* 1.41* 1.26*  CALCIUM 9.5 9.0 9.0 9.1  MG 1.8  --   --   --    GFR: Estimated Creatinine Clearance: 46.6 mL/min (A) (by C-G formula based on SCr of 1.26 mg/dL (H)). Liver Function Tests: Recent Labs  Lab 04/21/21 1413  AST 12*  ALT 10  ALKPHOS 77  BILITOT 0.7  PROT 8.1  ALBUMIN 3.5   Recent Labs  Lab 04/21/21 1413  LIPASE 28   No results for input(s): AMMONIA in the last 168 hours. Coagulation Profile: No results for input(s): INR, PROTIME in the last 168 hours. Cardiac Enzymes: No results for input(s): CKTOTAL, CKMB,  CKMBINDEX, TROPONINI in the last 168 hours. BNP (last 3 results) No results for input(s): PROBNP in the last 8760 hours. HbA1C: No results for input(s): HGBA1C in the last 72 hours. CBG: No results for input(s): GLUCAP in the last 168 hours. Lipid Profile: No results for input(s): CHOL, HDL, LDLCALC, TRIG, CHOLHDL, LDLDIRECT in the last 72 hours. Thyroid Function Tests: No results for input(s): TSH, T4TOTAL, FREET4, T3FREE, THYROIDAB in the last 72 hours. Anemia Panel: No results for input(s): VITAMINB12, FOLATE, FERRITIN, TIBC, IRON, RETICCTPCT in the last 72 hours. Urine analysis:    Component Value Date/Time   COLORURINE AMBER (A) 04/21/2021 1407   APPEARANCEUR HAZY (A) 04/21/2021 1407   LABSPEC 1.024 04/21/2021 1407   PHURINE 5.0 04/21/2021 1407   GLUCOSEU NEGATIVE 04/21/2021 1407   HGBUR NEGATIVE 04/21/2021 1407   BILIRUBINUR SMALL (A) 04/21/2021 1407   KETONESUR 5 (A) 04/21/2021 1407   PROTEINUR 30 (A) 04/21/2021 1407   UROBILINOGEN 1.0 02/27/2021 1607   NITRITE NEGATIVE 04/21/2021 1407   LEUKOCYTESUR NEGATIVE 04/21/2021 1407   Sepsis Labs: '@LABRCNTIP'$ (procalcitonin:4,lacticidven:4) ) Recent Results (from the past 240 hour(s))  Resp Panel by RT-PCR (Flu A&B, Covid) Nasopharyngeal Swab     Status: None   Collection Time: 04/21/21  6:49 PM   Specimen: Nasopharyngeal Swab; Nasopharyngeal(NP) swabs in vial transport medium  Result Value Ref Range Status   SARS Coronavirus 2 by RT PCR NEGATIVE NEGATIVE Final    Comment: (NOTE) SARS-CoV-2 target nucleic acids are NOT DETECTED.  The SARS-CoV-2 RNA is generally detectable in upper respiratory specimens during the acute phase of infection. The lowest concentration of SARS-CoV-2 viral copies this assay can detect is 138 copies/mL. A negative result does not preclude SARS-Cov-2 infection and should not be used as the sole basis for treatment or other patient management decisions. A negative result may occur with  improper  specimen collection/handling, submission of specimen other than nasopharyngeal swab, presence of viral mutation(s) within the areas targeted by this assay, and inadequate number of viral copies(<138 copies/mL). A negative result must be combined with clinical observations, patient history, and epidemiological information. The expected result is Negative.  Fact Sheet for Patients:  EntrepreneurPulse.com.au  Fact Sheet for Healthcare Providers:  IncredibleEmployment.be  This test is no t yet approved or cleared by the [place] FDA and  has been authorized for detection and/or diagnosis of SARS-CoV-2 by FDA under an Emergency Use Authorization (EUA). This EUA will remain  in effect (meaning this test can be used) for the duration of the COVID-19 declaration under Section 564(b)(1) of the Act,  21 U.S.C.section 360bbb-3(b)(1), unless the authorization is terminated  or revoked sooner.       Influenza A by PCR NEGATIVE NEGATIVE Final   Influenza B by PCR NEGATIVE NEGATIVE Final    Comment: (NOTE) The Xpert Xpress SARS-CoV-2/FLU/RSV plus assay is intended as an aid in the diagnosis of influenza from Nasopharyngeal swab specimens and should not be used as a sole basis for treatment. Nasal washings and aspirates are unacceptable for Xpert Xpress SARS-CoV-2/FLU/RSV testing.  Fact Sheet for Patients: EntrepreneurPulse.com.au  Fact Sheet for Healthcare Providers: IncredibleEmployment.be  This test is not yet approved or cleared by the [place] FDA and has been authorized for detection and/or diagnosis of SARS-CoV-2 by FDA under an Emergency Use Authorization (EUA). This EUA will remain in effect (meaning this test can be used) for the duration of the COVID-19 declaration under Section 564(b)(1) of the Act, 21 U.S.C. section 360bbb-3(b)(1), unless the authorization is terminated or revoked.  Performed at  [facility], [place] [address] [place], [place] [address]   Body fluid culture w Gram Stain     Status: None (Preliminary result)   Collection Time: 04/24/21  1:55 PM   Specimen: Abdomen; Peritoneal Fluid  Result Value Ref Range Status   Specimen Description PERITONEAL FLUID  Final   Special Requests ABDOMEN  Final   Gram Stain   Final    WBC PRESENT, PREDOMINANTLY MONONUCLEAR NO ORGANISMS SEEN CYTOSPIN SMEAR    Culture   Final    NO GROWTH < 24 HOURS Performed at [facility], [place] [address], [place], [place] [address]    Report Status PENDING  Incomplete         Radiology Studies: DG Abd Portable 1V  Result Date: 04/24/2021 CLINICAL DATA:  Small-bowel obstruction. EXAM: PORTABLE ABDOMEN - 1 VIEW COMPARISON:  04/22/2021.  CT 04/21/2021. FINDINGS: Mild-to-moderately distended loops of small bowel with a relative paucity of colonic gas again noted. Findings again consistent with small bowel obstruction. No free air. Ascites most likely present. Pelvic calcifications consistent phleboliths. Degenerative changes lumbar spine and both hips. IMPRESSION: Mild-to-moderate small-bowel distention with a paucity of intra colonic air again noted. Findings again consistent with small bowel obstruction. Ascites most likely present. Electronically Signed   By: Marcello Moores  Register   On: 04/24/2021 11:08   IR Paracentesis  Result Date: 04/24/2021 INDICATION: Abdominal distention secondary to ascites. Underlying concern for peritoneal carcinomatosis on imaging findings. Request for diagnostic and therapeutic paracentesis. EXAM: ULTRASOUND GUIDED LEFT LOWER QUADRANT PARACENTESIS MEDICATIONS: 1% plain lidocaine, 5 mL COMPLICATIONS: None immediate. PROCEDURE: Informed written consent was obtained from the patient after a discussion of the risks, benefits and alternatives to treatment. A timeout was performed prior to the initiation of the procedure. Initial ultrasound scanning demonstrates a large  amount of ascites within the left lower abdominal quadrant. The left lower abdomen was prepped and draped in the usual sterile fashion. 1% lidocaine was used for local anesthesia. Following this, a 19 gauge, 7-cm, Yueh catheter was introduced. An ultrasound image was saved for documentation purposes. The paracentesis was performed. The catheter was removed and a dressing was applied. The patient tolerated the procedure well without immediate post procedural complication. FINDINGS: A total of approximately 2.1 L of clear yellow fluid was removed. Samples were sent to the laboratory as requested by the clinical team. IMPRESSION: Successful ultrasound-guided paracentesis yielding 2.1 liters of peritoneal fluid. Read by: Ascencion Dike PA-C Electronically Signed   By: Michaelle Birks MD  On: 04/24/2021 12:38      Scheduled Meds:  (feeding supplement) PROSource Plus  30 mL Oral TID BM   feeding supplement  1 Container Oral TID BM   heparin  5,000 Units Subcutaneous Q8H   multivitamin with minerals  1 tablet Oral Daily   Continuous Infusions:  lactated ringers 50 mL/hr at 04/24/21 1606     LOS: 4 days      Debbe Odea, MD [facility] Pager: [email or web address] 04/25/2021, 3:55 PM

## 2021-04-26 ENCOUNTER — Encounter: Payer: Self-pay | Admitting: Oncology

## 2021-04-26 ENCOUNTER — Encounter (HOSPITAL_COMMUNITY): Payer: Self-pay | Admitting: Family Medicine

## 2021-04-26 ENCOUNTER — Inpatient Hospital Stay (HOSPITAL_COMMUNITY): Payer: Medicare Other

## 2021-04-26 DIAGNOSIS — D490 Neoplasm of unspecified behavior of digestive system: Secondary | ICD-10-CM

## 2021-04-26 DIAGNOSIS — E43 Unspecified severe protein-calorie malnutrition: Secondary | ICD-10-CM

## 2021-04-26 DIAGNOSIS — R634 Abnormal weight loss: Secondary | ICD-10-CM

## 2021-04-26 DIAGNOSIS — E876 Hypokalemia: Secondary | ICD-10-CM

## 2021-04-26 DIAGNOSIS — K56609 Unspecified intestinal obstruction, unspecified as to partial versus complete obstruction: Secondary | ICD-10-CM | POA: Diagnosis not present

## 2021-04-26 DIAGNOSIS — C786 Secondary malignant neoplasm of retroperitoneum and peritoneum: Principal | ICD-10-CM

## 2021-04-26 DIAGNOSIS — R9431 Abnormal electrocardiogram [ECG] [EKG]: Secondary | ICD-10-CM

## 2021-04-26 DIAGNOSIS — D75839 Thrombocytosis, unspecified: Secondary | ICD-10-CM

## 2021-04-26 DIAGNOSIS — R180 Malignant ascites: Secondary | ICD-10-CM | POA: Diagnosis not present

## 2021-04-26 DIAGNOSIS — E871 Hypo-osmolality and hyponatremia: Secondary | ICD-10-CM | POA: Diagnosis not present

## 2021-04-26 DIAGNOSIS — N179 Acute kidney failure, unspecified: Secondary | ICD-10-CM | POA: Diagnosis not present

## 2021-04-26 HISTORY — PX: IR IMAGING GUIDED PORT INSERTION: IMG5740

## 2021-04-26 LAB — CBC
HCT: 34.9 % — ABNORMAL LOW (ref 36.0–46.0)
Hemoglobin: 11.8 g/dL — ABNORMAL LOW (ref 12.0–15.0)
MCH: 30.6 pg (ref 26.0–34.0)
MCHC: 33.8 g/dL (ref 30.0–36.0)
MCV: 90.6 fL (ref 80.0–100.0)
Platelets: 360 10*3/uL (ref 150–400)
RBC: 3.85 MIL/uL — ABNORMAL LOW (ref 3.87–5.11)
RDW: 12.9 % (ref 11.5–15.5)
WBC: 9.2 10*3/uL (ref 4.0–10.5)
nRBC: 0 % (ref 0.0–0.2)

## 2021-04-26 LAB — BASIC METABOLIC PANEL
Anion gap: 10 (ref 5–15)
BUN: 11 mg/dL (ref 8–23)
CO2: 26 mmol/L (ref 22–32)
Calcium: 8.7 mg/dL — ABNORMAL LOW (ref 8.9–10.3)
Chloride: 100 mmol/L (ref 98–111)
Creatinine, Ser: 1.07 mg/dL — ABNORMAL HIGH (ref 0.44–1.00)
GFR, Estimated: 57 mL/min — ABNORMAL LOW (ref >60)
Glucose, Bld: 104 mg/dL — ABNORMAL HIGH (ref 70–99)
Potassium: 3.5 mmol/L (ref 3.5–5.1)
Sodium: 136 mmol/L (ref 135–145)

## 2021-04-26 LAB — MAGNESIUM: Magnesium: 1.5 mg/dL — ABNORMAL LOW (ref 1.7–2.4)

## 2021-04-26 MED ORDER — MIDAZOLAM HCL 2 MG/2ML IJ SOLN
INTRAMUSCULAR | Status: AC | PRN
  Administered 2021-04-26 (×2): 1 mg via INTRAVENOUS

## 2021-04-26 MED ORDER — CHLORHEXIDINE GLUCONATE CLOTH 2 % EX PADS
6.0000 | MEDICATED_PAD | Freq: Every day | CUTANEOUS | Status: DC
  Administered 2021-04-26 – 2021-04-30 (×5): 6 via TOPICAL

## 2021-04-26 MED ORDER — FENTANYL CITRATE (PF) 100 MCG/2ML IJ SOLN
INTRAMUSCULAR | Status: AC
  Filled 2021-04-26: qty 2

## 2021-04-26 MED ORDER — FENTANYL CITRATE (PF) 100 MCG/2ML IJ SOLN
INTRAMUSCULAR | Status: AC | PRN
  Administered 2021-04-26 (×2): 50 ug via INTRAVENOUS

## 2021-04-26 MED ORDER — MIDAZOLAM HCL 2 MG/2ML IJ SOLN
INTRAMUSCULAR | Status: AC
  Filled 2021-04-26: qty 2

## 2021-04-26 MED ORDER — MAGNESIUM SULFATE 4 GM/100ML IV SOLN
4.0000 g | Freq: Once | INTRAVENOUS | Status: AC
  Administered 2021-04-26: 4 g via INTRAVENOUS
  Filled 2021-04-26: qty 100

## 2021-04-26 MED ORDER — LIDOCAINE-EPINEPHRINE 1 %-1:100000 IJ SOLN
INTRAMUSCULAR | Status: AC | PRN
  Administered 2021-04-26 (×2): 10 mL via INTRADERMAL

## 2021-04-26 MED ORDER — LIDOCAINE-EPINEPHRINE 1 %-1:100000 IJ SOLN
INTRAMUSCULAR | Status: AC
  Filled 2021-04-26: qty 1

## 2021-04-26 MED ORDER — POTASSIUM CHLORIDE 10 MEQ/100ML IV SOLN
10.0000 meq | INTRAVENOUS | Status: AC
  Administered 2021-04-26 (×4): 10 meq via INTRAVENOUS
  Filled 2021-04-26: qty 100

## 2021-04-26 NOTE — Procedures (Signed)
Interventional Radiology Procedure Note  Procedure: RT IJ POWER PORT    Complications: None  Estimated Blood Loss:  MIN  Findings: TIP SVCRA    M. TREVOR Gaines Cartmell, MD    

## 2021-04-26 NOTE — Progress Notes (Signed)
Called [facility] for transport to [facility]. Patient is placed on the waiting list.  Updated patient of pending transfer to [facility] (Oncology unit Rm [id number]).

## 2021-04-26 NOTE — Progress Notes (Signed)
    OVERNIGHT PROGRESS REPORT   Changed Diet from regular to NPO with sips and ice to reflect Surgical and Attending Hospitalist Goals and given admitting diagnosis . Now NPO with, sips and Ice as tolerated  Gershon Cull MSNA MSN [place]

## 2021-04-26 NOTE — Progress Notes (Signed)
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, biopsy pending Pathology result is not available She had port placement today In the meantime, I recommend continue supportive care I am hopeful we can start her on carboplatin and paclitaxel tomorrow   Malignant ascites She will likely have reaccumulation of ascites with aggressive IV fluid hydration Monitor closely  Uncontrolled nausea and vomiting, stable Due to abdominal carcinomatosis This is not likely going to get better without treatment Continue antiemetics as needed For now, she does not need NG tube   Acute renal failure, resolved Due to dehydration and carcinomatosis It is improving Recommend gentle fluid hydration only   Constipation, resolved Continue conservative approach I am concerned about risk of bowel perforation with laxative Monitor closely   Code Status Full code   Goals of care Resolution of bowel obstruction, ability to tolerate oral intake and resolution of nausea   Discharge planning I anticipate she will be in the hospital 5 to 7 days All questions were answered. The patient knows to call the clinic with any problems, questions or concerns.   The total time spent in the appointment was 15 minutes encounter with patients including review of chart and various tests results, discussions about plan of care and coordination of care plan  Heath Lark, MD 04/26/2021 1:58 PM  Subjective:  She was seen briefly.  She had port placement this afternoon.  She denies pain or nausea Unfortunately, pathology report is not available  Objective:  Vitals:   04/26/21 1350 04/26/21 1355  BP: (!) 125/95 127/85  Pulse: 96 95  Resp: (!) 0 11  Temp:    SpO2: 99% 99%     Intake/Output Summary (Last 24 hours) at 04/26/2021 1358 Last data filed at 04/26/2021 0600 Gross per 24  hour  Intake 1003.04 ml  Output 0 ml  Net 1003.04 ml    GENERAL:alert, no distress and comfortable NEURO: alert & oriented x 3 with fluent speech, no focal motor/sensory deficits   Labs:  Recent Labs    05/20/20 1032 04/21/21 1413 04/22/21 0509 04/23/21 0825 04/24/21 0056 04/25/21 2343  NA 142 132*   < > 135 135 136  K 3.5 3.8   < > 3.7 4.0 3.5  CL 104 94*   < > 102 99 100  CO2 28 23   < > '22 24 26  '$ GLUCOSE 113* 110*   < > 92 105* 104*  BUN 10 17   < > '18 16 11  '$ CREATININE 1.03* 2.57*   < > 1.41* 1.26* 1.07*  CALCIUM 9.4 9.5   < > 9.0 9.1 8.7*  GFRNONAA 56* 20*   < > 41* 47* 57*  GFRAA >60  --   --   --   --   --   PROT 7.9 8.1  --   --   --   --   ALBUMIN 4.1 3.5  --   --   --   --   AST 20 12*  --   --   --   --   ALT 14 10  --   --   --   --   ALKPHOS 73 77  --   --   --   --   BILITOT 0.6 0.7  --   --   --   --    < > =  values in this interval not displayed.    Studies:  CT ABDOMEN PELVIS WO CONTRAST  Result Date: 04/21/2021 CLINICAL DATA:  Nausea and vomiting for 2 days. Lower abdominal pain for approximately 3 months. Chronic constipation. EXAM: CT ABDOMEN AND PELVIS WITHOUT CONTRAST TECHNIQUE: Multidetector CT imaging of the abdomen and pelvis was performed following the standard protocol without IV contrast. COMPARISON:  None. FINDINGS: Lower chest: No acute findings. Hepatobiliary: No mass visualized on this unenhanced exam. Gallbladder is unremarkable. No evidence of biliary ductal dilatation. Pancreas: No mass or inflammatory process visualized on this unenhanced exam. Spleen:  Within normal limits in size. Adrenals/Urinary tract: No evidence of urolithiasis or hydronephrosis. Unremarkable unopacified urinary bladder. Stomach/Bowel: Small hiatal hernia is seen containing ascites. Moderate diffuse small bowel dilatation is seen, with suspected transition point in the right lower quadrant, suspicious for distal small bowel obstruction. Vascular/Lymphatic: No  pathologically enlarged lymph nodes identified. No evidence of abdominal aortic aneurysm. Aortic atherosclerotic calcification noted. Reproductive: Prior hysterectomy suspected. Moderate ascites is seen. Omental caking and diffuse mesenteric soft tissue stranding is seen. There is also probable peritoneal nodularity along the left pelvic sidewall. These findings are highly suspicious for peritoneal carcinomatosis. Other:  None. Musculoskeletal:  No suspicious bone lesions identified. 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 Suspected distal small bowel obstruction, with probable transition point in right lower quadrant. Aortic Atherosclerosis (ICD10-I70.0). Electronically Signed   By: Marlaine Hind M.D.   On: 04/21/2021 18:36   US Abdomen Limited  Result Date: 04/22/2021 CLINICAL DATA:  Peritoneal neoplasm abdominal distension, recurrent ascites EXAM: LIMITED ABDOMEN ULTRASOUND FOR ASCITES TECHNIQUE: Limited ultrasound survey for ascites was performed in all four abdominal quadrants. COMPARISON:  04/21/2021 CT FINDINGS: Survey of the abdominal 4 quadrants demonstrates a small volume of lower abdominal ascites by ultrasound. Unable to perform paracentesis because of intractable nausea vomiting today. IMPRESSION: Small volume of lower abdominopelvic ascites. Will reassess for paracentesis tomorrow 04/23/2021. Electronically Signed   By: Jerilynn Mages.  Shick M.D.   On: 04/22/2021 12:09   DG Abd 2 Views  Result Date: 04/26/2021 CLINICAL DATA:  Follow-up small bowel obstruction. EXAM: ABDOMEN - 2 VIEW COMPARISON:  A2 22 FINDINGS: There is mild residual gaseous distension of multiple small bowel loops, decreased from prior. A small amount of gas is scattered in the colon and rectum. No intraperitoneal free air is identified. Atelectasis is noted in the left lung base. IMPRESSION: Decreased small bowel distension compatible with improving obstruction. Electronically Signed   By: Logan Bores  M.D.   On: 04/26/2021 12:45   DG Abd Portable 1V  Result Date: 04/24/2021 CLINICAL DATA:  Small-bowel obstruction. EXAM: PORTABLE ABDOMEN - 1 VIEW COMPARISON:  04/22/2021.  CT 04/21/2021. FINDINGS: Mild-to-moderately distended loops of small bowel with a relative paucity of colonic gas again noted. Findings again consistent with small bowel obstruction. No free air. Ascites most likely present. Pelvic calcifications consistent phleboliths. Degenerative changes lumbar spine and both hips. IMPRESSION: Mild-to-moderate small-bowel distention with a paucity of intra colonic air again noted. Findings again consistent with small bowel obstruction. Ascites most likely present. Electronically Signed   By: Marcello Moores  Register   On: 04/24/2021 11:08   DG Abd Portable 1V-Small Bowel Obstruction Protocol-initial, 8 hr delay  Result Date: 04/22/2021 CLINICAL DATA:  Small bowel obstruction, 8 hour film. EXAM: PORTABLE ABDOMEN - 1 VIEW COMPARISON:  CT yesterday. FINDINGS: Administered enteric contrast has reached the ascending and proximal transverse colon. There is enteric contrast within prominent small bowel in the  central abdomen. Left upper quadrant contrast may be residual within small bowel or at the splenic flexure. No evidence of free air. IMPRESSION: Administered enteric contrast has reached the ascending and proximal transverse colon. There is enteric contrast within prominent small bowel in the central abdomen. Left upper quadrant contrast may be residual within small bowel or at the splenic flexure. Electronically Signed   By: Keith Rake M.D.   On: 04/22/2021 17:38   IR Paracentesis  Result Date: 04/24/2021 INDICATION: Abdominal distention secondary to ascites. Underlying concern for peritoneal carcinomatosis on imaging findings. Request for diagnostic and therapeutic paracentesis. EXAM: ULTRASOUND GUIDED LEFT LOWER QUADRANT PARACENTESIS MEDICATIONS: 1% plain lidocaine, 5 mL COMPLICATIONS: None immediate.  PROCEDURE: Informed written consent was obtained from the patient after a discussion of the risks, benefits and alternatives to treatment. A timeout was performed prior to the initiation of the procedure. Initial ultrasound scanning demonstrates a large amount of ascites within the left lower abdominal quadrant. The left lower abdomen was prepped and draped in the usual sterile fashion. 1% lidocaine was used for local anesthesia. Following this, a 19 gauge, 7-cm, Yueh catheter was introduced. An ultrasound image was saved for documentation purposes. The paracentesis was performed. The catheter was removed and a dressing was applied. The patient tolerated the procedure well without immediate post procedural complication. FINDINGS: A total of approximately 2.1 L of clear yellow fluid was removed. Samples were sent to the laboratory as requested by the clinical team. IMPRESSION: Successful ultrasound-guided paracentesis yielding 2.1 liters of peritoneal fluid. Read by: Ascencion Dike PA-C Electronically Signed   By: Michaelle Birks MD   On: 04/24/2021 12:38

## 2021-04-26 NOTE — Plan of Care (Signed)
  Problem: Education: Goal: Knowledge of General Education information will improve Description: Including pain rating scale, medication(s)/side effects and non-pharmacologic comfort measures Outcome: Completed/Met

## 2021-04-26 NOTE — Progress Notes (Signed)
Patient ID: Brenda Stewart, female   DOB: July 13, 1951, 70 y.o.   MRN: [id number]     Subjective: Vomiting again after reattempting just CLD yesterday.  No flatus.  ROS negative except as listed above. Objective: Vital signs in last 24 hours: Temp:  [98.2 F (36.8 C)-100.2 F (37.9 C)] 98.5 F (36.9 C) (08/04 1034) Pulse Rate:  [93-111] 93 (08/04 1034) Resp:  [16-20] 16 (08/04 1034) BP: (117-137)/(85-96) 135/93 (08/04 1034) SpO2:  [93 %-97 %] 97 % (08/04 1034) Last BM Date: 04/25/21  Intake/Output from previous day: 08/03 0701 - 08/04 0700 In: 1123 [P.O.:540; I.V.:583] Out: 0  Intake/Output this shift: No intake/output data recorded.  PE: Abd: soft, mildly tender, some BS, nondistended appearing  Lab Results: CBC  Recent Labs    04/24/21 0056 04/25/21 2343  WBC 9.3 9.2  HGB 11.0* 11.8*  HCT 32.8* 34.9*  PLT 396 360   BMET Recent Labs    04/24/21 0056 04/25/21 2343  NA 135 136  K 4.0 3.5  CL 99 100  CO2 24 26  GLUCOSE 105* 104*  BUN 16 11  CREATININE 1.26* 1.07*  CALCIUM 9.1 8.7*   PT/INR No results for input(s): LABPROT, INR in the last 72 hours. ABG No results for input(s): PHART, HCO3 in the last 72 hours.  Invalid input(s): PCO2, PO2  Studies/Results: No results found.  Anti-infectives: Anti-infectives (From admission, onward)    None       Assessment/Plan: SBO likely due to carcinomatosis - SBO protocol, with contrast in colon -this is likely a high-grade PSBO that may not improve given etiology, unless it responds to chemotherapy. -patient has now failed 2 attempts at CLD as she keep vomiting.  She tolerates NPO status with no vomiting so think we can avoid NGT, but in order for her to get nutrition to try and accomplish goal of chemotherapy she is likely going to need TNA. -defer to onc/primary team as usually we aren't able to use port for TNA while getting chemo.  If that's the case, she may need a picc on opposite site of port?? -no  surgical plans at this time given carcinomatosis and one resection may not correct problem of obstruction. -awaiting cytology from para.  If negative, recommend IR for biopsy of possible soft tissue areas noted on CT scan  FEN - NPO, may have ice and sips of clears from floor after procedure as able  VTE - heparin ID- none needed  HTN GERD   LOS: 5 days    Henreitta Cea PA-C Trauma & General Surgery Use [email or web address] to contact on call provider  04/26/2021

## 2021-04-26 NOTE — Progress Notes (Signed)
Chief Complaint: Patient was seen in consultation today for port placement  Referring Physician(s): Dr. Heath Lark  Supervising Physician: Daryll Brod  Patient Status: [facility] - In-pt  History of Present Illness: Brenda Stewart is a 70 y.o. female with peritoneal carcinomatosis, suspected GYN etiology. Developed bowel obstructive sxs but has improved some, surgery not required. Underwent paracentesis for diagnostic and therapeutic purposes, cytology not reported yet. Oncology wishes to pursue starting chemotherapy during this admission and has requested portacath be placed. IR I asked to place port. PMHx, meds, labs, imaging, allergies reviewed. Feels okay, no recent fevers, chills, illness. Has been NPO today as directed.    Past Medical History:  Diagnosis Date   Acid reflux    Hypertension     Past Surgical History:  Procedure Laterality Date   BUNIONECTOMY Bilateral    HAND TENDON SURGERY Bilateral    IR PARACENTESIS  04/24/2021   PARTIAL HYSTERECTOMY      Allergies: Patient has no known allergies.  Medications:  Current Facility-Administered Medications:    (feeding supplement) PROSource Plus liquid 30 mL, 30 mL, Oral, TID BM, Lavina Hamman, MD, 30 mL at 04/25/21 1505   acetaminophen (TYLENOL) tablet 650 mg, 650 mg, Oral, Q6H PRN **OR** acetaminophen (TYLENOL) suppository 650 mg, 650 mg, Rectal, Q6H PRN, Chotiner, Yevonne Aline, MD   feeding supplement (BOOST / RESOURCE BREEZE) liquid 1 Container, 1 Container, Oral, TID BM, Lavina Hamman, MD, 1 Container at 04/25/21 1505   heparin injection 5,000 Units, 5,000 Units, Subcutaneous, Q8H, Chotiner, Yevonne Aline, MD, 5,000 Units at 04/25/21 2141   HYDROmorphone (DILAUDID) injection 1 mg, 1 mg, Intravenous, Q3H PRN, Chotiner, Yevonne Aline, MD, 1 mg at 04/22/21 1040   lactated ringers infusion, , Intravenous, Continuous, Gorsuch, Ni, MD, Last Rate: 50 mL/hr at 04/26/21 0339, New Bag at 04/26/21 0339   lidocaine (XYLOCAINE) 1  % (with pres) injection, , Infiltration, PRN, Sarha Bartelt, PA-C, 10 mL at 04/24/21 1159   multivitamin with minerals tablet 1 tablet, 1 tablet, Oral, Daily, Lavina Hamman, MD, 1 tablet at 04/25/21 1004   ondansetron (ZOFRAN) injection 4 mg, 4 mg, Intravenous, Q6H PRN, Lavina Hamman, MD, 4 mg at 04/25/21 1505   potassium chloride 10 mEq in 100 mL IVPB, 10 mEq, Intravenous, Q1 Hr x 4, Eugenie Filler, MD, Last Rate: 100 mL/hr at 04/26/21 1008, 10 mEq at 04/26/21 1008   trimethobenzamide (TIGAN) injection 200 mg, 200 mg, Intramuscular, Q6H PRN, Chotiner, Yevonne Aline, MD, 200 mg at 04/24/21 [id number]    History reviewed. No pertinent family history.  Social History   Socioeconomic History   Marital status: Single    Spouse name: Not on file   Number of children: Not on file   Years of education: Not on file   Highest education level: Not on file  Occupational History   Not on file  Tobacco Use   Smoking status: Never   Smokeless tobacco: Never  Vaping Use   Vaping Use: Never used  Substance and Sexual Activity   Alcohol use: Yes   Drug use: No   Sexual activity: Not on file  Other Topics Concern   Not on file  Social History Narrative   Not on file   Social Determinants of Health   Financial Resource Strain: Not on file  Food Insecurity: Not on file  Transportation Needs: Not on file  Physical Activity: Not on file  Stress: Not on file  Social Connections: Not on file  Review of Systems: A 12 point ROS discussed and pertinent positives are indicated in the HPI above.  All other systems are negative.  Review of Systems  Vital Signs: BP (!) 124/91 (BP Location: Right Arm)   Pulse (!) 103   Temp 98.2 F (36.8 C) (Oral)   Resp 16   Ht '5\' 8"'$  (1.727 m)   Wt 77 kg   SpO2 97%   BMI 25.81 kg/m   Physical Exam Constitutional:      Appearance: Normal appearance. She is not ill-appearing.  HENT:     Mouth/Throat:     Mouth: Mucous membranes are moist.      Pharynx: Oropharynx is clear.  Cardiovascular:     Rate and Rhythm: Normal rate and regular rhythm.     Heart sounds: Normal heart sounds.  Pulmonary:     Effort: Pulmonary effort is normal. No respiratory distress.     Breath sounds: Normal breath sounds.  Abdominal:     General: There is distension.     Palpations: Abdomen is soft.     Tenderness: There is no abdominal tenderness.  Skin:    General: Skin is warm and dry.  Neurological:     General: No focal deficit present.     Mental Status: She is alert and oriented to person, place, and time.  Psychiatric:        Mood and Affect: Mood normal.        Thought Content: Thought content normal.        Judgment: Judgment normal.    Imaging: CT ABDOMEN PELVIS WO CONTRAST  Result Date: 04/21/2021 CLINICAL DATA:  Nausea and vomiting for 2 days. Lower abdominal pain for approximately 3 months. Chronic constipation. EXAM: CT ABDOMEN AND PELVIS WITHOUT CONTRAST TECHNIQUE: Multidetector CT imaging of the abdomen and pelvis was performed following the standard protocol without IV contrast. COMPARISON:  None. FINDINGS: Lower chest: No acute findings. Hepatobiliary: No mass visualized on this unenhanced exam. Gallbladder is unremarkable. No evidence of biliary ductal dilatation. Pancreas: No mass or inflammatory process visualized on this unenhanced exam. Spleen:  Within normal limits in size. Adrenals/Urinary tract: No evidence of urolithiasis or hydronephrosis. Unremarkable unopacified urinary bladder. Stomach/Bowel: Small hiatal hernia is seen containing ascites. Moderate diffuse small bowel dilatation is seen, with suspected transition point in the right lower quadrant, suspicious for distal small bowel obstruction. Vascular/Lymphatic: No pathologically enlarged lymph nodes identified. No evidence of abdominal aortic aneurysm. Aortic atherosclerotic calcification noted. Reproductive: Prior hysterectomy suspected. Moderate ascites is seen. Omental  caking and diffuse mesenteric soft tissue stranding is seen. There is also probable peritoneal nodularity along the left pelvic sidewall. These findings are highly suspicious for peritoneal carcinomatosis. Other:  None. Musculoskeletal:  No suspicious bone lesions identified. 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 Suspected distal small bowel obstruction, with probable transition point in right lower quadrant. Aortic Atherosclerosis (ICD10-I70.0). Electronically Signed   By: Marlaine Hind M.D.   On: 04/21/2021 18:36   US Abdomen Limited  Result Date: 04/22/2021 CLINICAL DATA:  Peritoneal neoplasm abdominal distension, recurrent ascites EXAM: LIMITED ABDOMEN ULTRASOUND FOR ASCITES TECHNIQUE: Limited ultrasound survey for ascites was performed in all four abdominal quadrants. COMPARISON:  04/21/2021 CT FINDINGS: Survey of the abdominal 4 quadrants demonstrates a small volume of lower abdominal ascites by ultrasound. Unable to perform paracentesis because of intractable nausea vomiting today. IMPRESSION: Small volume of lower abdominopelvic ascites. Will reassess for paracentesis tomorrow 04/23/2021. Electronically Signed   By: Jerilynn Mages.  Shick M.D.  On: 04/22/2021 12:09   DG Abd Portable 1V  Result Date: 04/24/2021 CLINICAL DATA:  Small-bowel obstruction. EXAM: PORTABLE ABDOMEN - 1 VIEW COMPARISON:  04/22/2021.  CT 04/21/2021. FINDINGS: Mild-to-moderately distended loops of small bowel with a relative paucity of colonic gas again noted. Findings again consistent with small bowel obstruction. No free air. Ascites most likely present. Pelvic calcifications consistent phleboliths. Degenerative changes lumbar spine and both hips. IMPRESSION: Mild-to-moderate small-bowel distention with a paucity of intra colonic air again noted. Findings again consistent with small bowel obstruction. Ascites most likely present. Electronically Signed   By: Marcello Moores  Register   On: 04/24/2021 11:08    DG Abd Portable 1V-Small Bowel Obstruction Protocol-initial, 8 hr delay  Result Date: 04/22/2021 CLINICAL DATA:  Small bowel obstruction, 8 hour film. EXAM: PORTABLE ABDOMEN - 1 VIEW COMPARISON:  CT yesterday. FINDINGS: Administered enteric contrast has reached the ascending and proximal transverse colon. There is enteric contrast within prominent small bowel in the central abdomen. Left upper quadrant contrast may be residual within small bowel or at the splenic flexure. No evidence of free air. IMPRESSION: Administered enteric contrast has reached the ascending and proximal transverse colon. There is enteric contrast within prominent small bowel in the central abdomen. Left upper quadrant contrast may be residual within small bowel or at the splenic flexure. Electronically Signed   By: Keith Rake M.D.   On: 04/22/2021 17:38   IR Paracentesis  Result Date: 04/24/2021 INDICATION: Abdominal distention secondary to ascites. Underlying concern for peritoneal carcinomatosis on imaging findings. Request for diagnostic and therapeutic paracentesis. EXAM: ULTRASOUND GUIDED LEFT LOWER QUADRANT PARACENTESIS MEDICATIONS: 1% plain lidocaine, 5 mL COMPLICATIONS: None immediate. PROCEDURE: Informed written consent was obtained from the patient after a discussion of the risks, benefits and alternatives to treatment. A timeout was performed prior to the initiation of the procedure. Initial ultrasound scanning demonstrates a large amount of ascites within the left lower abdominal quadrant. The left lower abdomen was prepped and draped in the usual sterile fashion. 1% lidocaine was used for local anesthesia. Following this, a 19 gauge, 7-cm, Yueh catheter was introduced. An ultrasound image was saved for documentation purposes. The paracentesis was performed. The catheter was removed and a dressing was applied. The patient tolerated the procedure well without immediate post procedural complication. FINDINGS: A total  of approximately 2.1 L of clear yellow fluid was removed. Samples were sent to the laboratory as requested by the clinical team. IMPRESSION: Successful ultrasound-guided paracentesis yielding 2.1 liters of peritoneal fluid. Read by: Ascencion Dike PA-C Electronically Signed   By: Michaelle Birks MD   On: 04/24/2021 12:38    Labs:  CBC: Recent Labs    04/22/21 0509 04/23/21 0825 04/24/21 0056 04/25/21 2343  WBC 10.8* 9.7 9.3 9.2  HGB 11.3* 10.9* 11.0* 11.8*  HCT 33.9* 32.1* 32.8* 34.9*  PLT 445* 413* 396 360    COAGS: No results for input(s): INR, APTT in the last 8760 hours.  BMP: Recent Labs    05/20/20 1032 04/21/21 1413 04/22/21 0509 04/23/21 0825 04/24/21 0056 04/25/21 2343  NA 142   < > 135 135 135 136  K 3.5   < > 3.9 3.7 4.0 3.5  CL 104   < > 99 102 99 100  CO2 28   < > '23 22 24 26  '$ GLUCOSE 113*   < > 104* 92 105* 104*  BUN 10   < > '21 18 16 11  '$ CALCIUM 9.4   < > 9.0 9.0  9.1 8.7*  CREATININE 1.03*   < > 2.35* 1.41* 1.26* 1.07*  GFRNONAA 56*   < > 22* 41* 47* 57*  GFRAA >60  --   --   --   --   --    < > = values in this interval not displayed.    LIVER FUNCTION TESTS: Recent Labs    05/20/20 1032 04/21/21 1413  BILITOT 0.6 0.7  AST 20 12*  ALT 14 10  ALKPHOS 73 77  PROT 7.9 8.1  ALBUMIN 4.1 3.5    TUMOR MARKERS: No results for input(s): AFPTM, CEA, CA199, CHROMGRNA in the last 8760 hours.  Assessment and Plan: Peritoneal carcinomatosis, presumed GYN malignancy. Plan for portcath today. Labs reviewed. Subq heparin has been held Risks and benefits of image guided port-a-catheter placement was discussed with the patient including, but not limited to bleeding, infection, pneumothorax, or fibrin sheath development and need for additional procedures.  All of the patient's questions were answered, patient is agreeable to proceed. Consent signed and in chart.   Thank you for this interesting consult.  I greatly enjoyed meeting TIAJAH LABEAN and look  forward to participating in their care.  A copy of this report was sent to the requesting provider on this date.  Electronically Signed: Ascencion Dike, PA-C 04/26/2021, 10:20 AM   I spent a total of 25 minutes in face to face in clinical consultation, greater than 50% of which was counseling/coordinating care for port placement

## 2021-04-26 NOTE — Progress Notes (Signed)
Met with Brenda Stewart and reviewed plan for chemotherapy.  Provided her with the [facility] folder and discussed the [employer].  She would like a referral.  Encouraged her to call with any questions or needs.

## 2021-04-26 NOTE — Progress Notes (Signed)
PROGRESS NOTE    KEYONNI LORAH  [id number] DOB: Nov 11, 1950 DOA: 04/21/2021 PCP: Pcp, No    No chief complaint on file.   Brief Narrative:  MATTISON FIGURSKI is a 70 y/o F with HTN, GERD who presented with abdominal pain and was found to have SBO due to peritoneal carcinomatosis.  General surgery and oncology consulted and following.  Patient transferred to [facility] to start chemotherapy.   Assessment & Plan:   Principal Problem:   SBO (small bowel obstruction) (HCC) Active Problems:   AKI (acute kidney injury) ([place])   Prolonged QT interval   Unintended weight loss   Nausea & vomiting   Hyponatremia   Neoplasm of peritoneum   Leukocytosis   Ascites   Thrombocytosis   Protein-calorie malnutrition, severe  #1 small bowel obstruction -Likely secondary to ovarian or fallopian tube cancer, peritoneal carcinomatosis. -Biopsies pending. -Patient underwent trial of clear liquids x2 with nausea and emesis and currently n.p.o. except ice chips. -Abdominal films done today with decreased small bowel distention compatible with improving obstruction. -Patient with no flatus, no bowel movement. -General surgery following and appreciate input and recommendations.  2.  Peritoneal carcinomatosis/elevated CA125/concern for ovarian or fallopian tube cancer with biopsies pending -Pathology pending. -Status post paracentesis with cytology pending. -Patient to have port placed today. -Oncology following and hoping to start patient on chemotherapy with carboplatin and paclitaxel tomorrow. -Per oncology.  3.  Malignant ascites -Status post paracentesis. -Concern for reaccumulation of ascites. -IV fluid rate decreased to 50 cc an hour. -Per oncology.  4.  Acute renal failure -Secondary to prerenal azotemia. -Improved with hydration. -IV fluid rate decreased to 50 cc an hour. -Follow.  5.  Prolonged QT interval -Repeat EKG. -Keep potassium close to 4, magnesium close to  2. -Follow.  6.  Severe protein calorie malnutrition/unintended weight loss -Likely secondary to malignancy/#2. -Patient currently n.p.o. secondary to problem #1. -If no significant improvement may need to be started on TNA and may require a separate PICC line as patient to receive chemotherapy through port. -We will defer initiation of TNA to oncology.  7.  Thrombocytosis -Resolved.  8.  Hypomagnesemia -Magnesium sulfate 4 g IV x1. -Repeat labs in the morning.  9.  Hyponatremia -Resolved.     DVT prophylaxis: Heparin Code Status: Full Family Communication: Updated patient.  No family at bedside. Disposition:   Status is: Inpatient  Remains inpatient appropriate because:Inpatient level of care appropriate due to severity of illness  Dispo: The patient is from: Home              Anticipated d/c is to: Home              Patient currently is not medically stable to d/c.   Difficult to place patient No       Consultants:  Neurosurgery: Dr. Rosendo Gros 04/21/2020 Oncology: Dr. Lorna Few 04/22/2020 GYN oncology: Dr. Denman George 04/24/2020  Procedures:  CT abdomen pelvis 04/21/2021 Abdominal films 04/22/2021, 04/24/2021, 04/26/2021 Abdominal ultrasound 04/22/2021 Ultrasound-guided left lower quadrant paracentesis--2.1 L of peritoneal fluid removed, per IR, Dr.Mugweru     Antimicrobials: None   Subjective: Noted to have nausea and emesis wityh clears yesterday. Intermittent abd pain. No flatus. No BM. No SOB. No CP.  Patient getting ready to go down for port placement.  Objective: Vitals:   04/26/21 0519 04/26/21 0531 04/26/21 0617 04/26/21 1034  BP: 117/85 124/86 (!) 124/91 (!) 135/93  Pulse: 98 (!) 111 (!) 103 93  Resp: 17  $'20 16 16  'R$ Temp: 98.4 F (36.9 C) 98.3 F (36.8 C) 98.2 F (36.8 C) 98.5 F (36.9 C)  TempSrc: Oral Oral Oral Oral  SpO2: 93% 96% 97% 97%  Weight:      Height:        Intake/Output Summary (Last 24 hours) at 04/26/2021 1335 Last data filed at  04/26/2021 0600 Gross per 24 hour  Intake 1003.04 ml  Output 0 ml  Net 1003.04 ml   Filed Weights   04/21/21 1704 04/24/21 0546  Weight: 77.6 kg 77 kg    Examination:  General exam: Appears calm and comfortable  Respiratory system: Clear to auscultation. Respiratory effort normal. Cardiovascular system: S1 & S2 heard, RRR. No JVD, murmurs, rubs, gallops or clicks. No pedal edema. Gastrointestinal system: Abdomen is nondistended, soft and nontender. No organomegaly or masses felt. Normal bowel sounds heard. Central nervous system: Alert and oriented. No focal neurological deficits. Extremities: Symmetric 5 x 5 power. Skin: No rashes, lesions or ulcers Psychiatry: Judgement and insight appear normal. Mood & affect appropriate.     Data Reviewed: I have personally reviewed following labs and imaging studies  CBC: Recent Labs  Lab 04/21/21 1413 04/22/21 0509 04/23/21 0825 04/24/21 0056 04/25/21 2343  WBC 13.0* 10.8* 9.7 9.3 9.2  NEUTROABS 10.1*  --   --   --   --   HGB 12.8 11.3* 10.9* 11.0* 11.8*  HCT 38.6 33.9* 32.1* 32.8* 34.9*  MCV 92.8 92.6 91.5 91.6 90.6  PLT 535* 445* 413* 396 [id number]    Basic Metabolic Panel: Recent Labs  Lab 04/21/21 1413 04/22/21 0509 04/23/21 0825 04/24/21 0056 04/25/21 2343 04/26/21 0914  NA 132* 135 135 135 136  --   K 3.8 3.9 3.7 4.0 3.5  --   CL 94* 99 102 99 100  --   CO2 '23 23 22 24 26  '$ --   GLUCOSE 110* 104* 92 105* 104*  --   BUN '17 21 18 16 11  '$ --   CREATININE 2.57* 2.35* 1.41* 1.26* 1.07*  --   CALCIUM 9.5 9.0 9.0 9.1 8.7*  --   MG 1.8  --   --   --   --  1.5*    GFR: Estimated Creatinine Clearance: 54.9 mL/min (A) (by C-G formula based on SCr of 1.07 mg/dL (H)).  Liver Function Tests: Recent Labs  Lab 04/21/21 1413  AST 12*  ALT 10  ALKPHOS 77  BILITOT 0.7  PROT 8.1  ALBUMIN 3.5    CBG: No results for input(s): GLUCAP in the last 168 hours.   Recent Results (from the past 240 hour(s))  Resp Panel by RT-PCR  (Flu A&B, Covid) Nasopharyngeal Swab     Status: None   Collection Time: 04/21/21  6:49 PM   Specimen: Nasopharyngeal Swab; Nasopharyngeal(NP) swabs in vial transport medium  Result Value Ref Range Status   SARS Coronavirus 2 by RT PCR NEGATIVE NEGATIVE Final    Comment: (NOTE) SARS-CoV-2 target nucleic acids are NOT DETECTED.  The SARS-CoV-2 RNA is generally detectable in upper respiratory specimens during the acute phase of infection. The lowest concentration of SARS-CoV-2 viral copies this assay can detect is 138 copies/mL. A negative result does not preclude SARS-Cov-2 infection and should not be used as the sole basis for treatment or other patient management decisions. A negative result may occur with  improper specimen collection/handling, submission of specimen other than nasopharyngeal swab, presence of viral mutation(s) within the areas targeted by this assay, and  inadequate number of viral copies(<138 copies/mL). A negative result must be combined with clinical observations, patient history, and epidemiological information. The expected result is Negative.  Fact Sheet for Patients:  EntrepreneurPulse.com.au  Fact Sheet for Healthcare Providers:  IncredibleEmployment.be  This test is no t yet approved or cleared by the [place] FDA and  has been authorized for detection and/or diagnosis of SARS-CoV-2 by FDA under an Emergency Use Authorization (EUA). This EUA will remain  in effect (meaning this test can be used) for the duration of the COVID-19 declaration under Section 564(b)(1) of the Act, 21 U.S.C.section 360bbb-3(b)(1), unless the authorization is terminated  or revoked sooner.       Influenza A by PCR NEGATIVE NEGATIVE Final   Influenza B by PCR NEGATIVE NEGATIVE Final    Comment: (NOTE) The Xpert Xpress SARS-CoV-2/FLU/RSV plus assay is intended as an aid in the diagnosis of influenza from Nasopharyngeal swab specimens  and should not be used as a sole basis for treatment. Nasal washings and aspirates are unacceptable for Xpert Xpress SARS-CoV-2/FLU/RSV testing.  Fact Sheet for Patients: EntrepreneurPulse.com.au  Fact Sheet for Healthcare Providers: IncredibleEmployment.be  This test is not yet approved or cleared by the [place] FDA and has been authorized for detection and/or diagnosis of SARS-CoV-2 by FDA under an Emergency Use Authorization (EUA). This EUA will remain in effect (meaning this test can be used) for the duration of the COVID-19 declaration under Section 564(b)(1) of the Act, 21 U.S.C. section 360bbb-3(b)(1), unless the authorization is terminated or revoked.  Performed at [facility], [place] [address] [place], [place] [address]   Body fluid culture w Gram Stain     Status: None (Preliminary result)   Collection Time: 04/24/21  1:55 PM   Specimen: Abdomen; Peritoneal Fluid  Result Value Ref Range Status   Specimen Description PERITONEAL FLUID  Final   Special Requests ABDOMEN  Final   Gram Stain   Final    WBC PRESENT, PREDOMINANTLY MONONUCLEAR NO ORGANISMS SEEN CYTOSPIN SMEAR    Culture   Final    NO GROWTH 2 DAYS Performed at [facility], [place] [address], [place], [place] [address]    Report Status PENDING  Incomplete         Radiology Studies: DG Abd 2 Views  Result Date: 04/26/2021 CLINICAL DATA:  Follow-up small bowel obstruction. EXAM: ABDOMEN - 2 VIEW COMPARISON:  A2 22 FINDINGS: There is mild residual gaseous distension of multiple small bowel loops, decreased from prior. A small amount of gas is scattered in the colon and rectum. No intraperitoneal free air is identified. Atelectasis is noted in the left lung base. IMPRESSION: Decreased small bowel distension compatible with improving obstruction. Electronically Signed   By: Logan Bores M.D.   On: 04/26/2021 12:45        Scheduled Meds:  (feeding  supplement) PROSource Plus  30 mL Oral TID BM   feeding supplement  1 Container Oral TID BM   fentaNYL       heparin  5,000 Units Subcutaneous Q8H   lidocaine-EPINEPHrine       midazolam       multivitamin with minerals  1 tablet Oral Daily   Continuous Infusions:  lactated ringers 50 mL/hr at 04/26/21 0339   magnesium sulfate bolus IVPB     potassium chloride 10 mEq (04/26/21 1220)     LOS: 5 days    Time spent: 35 minutes    Irine Seal, MD [facility]  To contact the attending provider between 7A-7P or the covering provider during after hours 7P-7A, please log into the web site [email or web address] and access using universal [place] password for that web site. If you do not have the password, please call the hospital operator.  04/26/2021, 1:35 PM

## 2021-04-26 NOTE — Progress Notes (Signed)
Report given to [facility]. [facility] here for transport.

## 2021-04-27 DIAGNOSIS — R180 Malignant ascites: Secondary | ICD-10-CM | POA: Diagnosis not present

## 2021-04-27 DIAGNOSIS — N179 Acute kidney failure, unspecified: Secondary | ICD-10-CM | POA: Diagnosis not present

## 2021-04-27 DIAGNOSIS — E871 Hypo-osmolality and hyponatremia: Secondary | ICD-10-CM | POA: Diagnosis not present

## 2021-04-27 DIAGNOSIS — K56609 Unspecified intestinal obstruction, unspecified as to partial versus complete obstruction: Secondary | ICD-10-CM | POA: Diagnosis not present

## 2021-04-27 LAB — COMPREHENSIVE METABOLIC PANEL
ALT: 8 U/L (ref 0–44)
AST: 14 U/L — ABNORMAL LOW (ref 15–41)
Albumin: 2.9 g/dL — ABNORMAL LOW (ref 3.5–5.0)
Alkaline Phosphatase: 49 U/L (ref 38–126)
Anion gap: 6 (ref 5–15)
BUN: 10 mg/dL (ref 8–23)
CO2: 27 mmol/L (ref 22–32)
Calcium: 8.6 mg/dL — ABNORMAL LOW (ref 8.9–10.3)
Chloride: 103 mmol/L (ref 98–111)
Creatinine, Ser: 0.93 mg/dL (ref 0.44–1.00)
GFR, Estimated: >60 mL/min (ref >60)
Glucose, Bld: 116 mg/dL — ABNORMAL HIGH (ref 70–99)
Potassium: 3.6 mmol/L (ref 3.5–5.1)
Sodium: 136 mmol/L (ref 135–145)
Total Bilirubin: 0.6 mg/dL (ref 0.3–1.2)
Total Protein: 6.2 g/dL — ABNORMAL LOW (ref 6.5–8.1)

## 2021-04-27 LAB — CBC WITH DIFFERENTIAL/PLATELET
Abs Immature Granulocytes: 0.18 10*3/uL — ABNORMAL HIGH (ref 0.00–0.07)
Basophils Absolute: 0 10*3/uL (ref 0.0–0.1)
Basophils Relative: 0 %
Eosinophils Absolute: 0.1 10*3/uL (ref 0.0–0.5)
Eosinophils Relative: 1 %
HCT: 33 % — ABNORMAL LOW (ref 36.0–46.0)
Hemoglobin: 10.9 g/dL — ABNORMAL LOW (ref 12.0–15.0)
Immature Granulocytes: 2 %
Lymphocytes Relative: 13 %
Lymphs Abs: 1.2 10*3/uL (ref 0.7–4.0)
MCH: 30.2 pg (ref 26.0–34.0)
MCHC: 33 g/dL (ref 30.0–36.0)
MCV: 91.4 fL (ref 80.0–100.0)
Monocytes Absolute: 0.8 10*3/uL (ref 0.1–1.0)
Monocytes Relative: 8 %
Neutro Abs: 7.1 10*3/uL (ref 1.7–7.7)
Neutrophils Relative %: 76 %
Platelets: 311 10*3/uL (ref 150–400)
RBC: 3.61 MIL/uL — ABNORMAL LOW (ref 3.87–5.11)
RDW: 13 % (ref 11.5–15.5)
WBC: 9.3 10*3/uL (ref 4.0–10.5)
nRBC: 0 % (ref 0.0–0.2)

## 2021-04-27 LAB — PHOSPHORUS: Phosphorus: 3.3 mg/dL (ref 2.5–4.6)

## 2021-04-27 LAB — IRON AND TIBC
Iron: 18 ug/dL — ABNORMAL LOW (ref 28–170)
Saturation Ratios: 12 % (ref 10.4–31.8)
TIBC: 145 ug/dL — ABNORMAL LOW (ref 250–450)
UIBC: 127 ug/dL

## 2021-04-27 LAB — BODY FLUID CULTURE W GRAM STAIN: Culture: NO GROWTH

## 2021-04-27 LAB — CYTOLOGY - NON PAP

## 2021-04-27 LAB — FERRITIN: Ferritin: 415 ng/mL — ABNORMAL HIGH (ref 11–307)

## 2021-04-27 LAB — FOLATE: Folate: 5 ng/mL — ABNORMAL LOW (ref >5.9)

## 2021-04-27 LAB — VITAMIN B12: Vitamin B-12: 952 pg/mL — ABNORMAL HIGH (ref 180–914)

## 2021-04-27 LAB — MAGNESIUM: Magnesium: 2.1 mg/dL (ref 1.7–2.4)

## 2021-04-27 MED ORDER — POTASSIUM CHLORIDE 10 MEQ/100ML IV SOLN
10.0000 meq | INTRAVENOUS | Status: AC
  Administered 2021-04-27 (×4): 10 meq via INTRAVENOUS
  Filled 2021-04-27 (×2): qty 100

## 2021-04-27 MED ORDER — PROCHLORPERAZINE EDISYLATE 10 MG/2ML IJ SOLN
5.0000 mg | Freq: Four times a day (QID) | INTRAMUSCULAR | Status: DC | PRN
  Administered 2021-04-28: 10 mg via INTRAVENOUS
  Filled 2021-04-27: qty 2

## 2021-04-27 MED ORDER — ENOXAPARIN SODIUM 40 MG/0.4ML IJ SOSY
40.0000 mg | PREFILLED_SYRINGE | INTRAMUSCULAR | Status: DC
  Administered 2021-04-28 – 2021-04-29 (×2): 40 mg via SUBCUTANEOUS
  Filled 2021-04-27 (×2): qty 0.4

## 2021-04-27 MED ORDER — SODIUM CHLORIDE 0.9 % IV BOLUS
500.0000 mL | Freq: Once | INTRAVENOUS | Status: AC
  Administered 2021-04-27: 500 mL via INTRAVENOUS

## 2021-04-27 MED ORDER — ENOXAPARIN SODIUM 30 MG/0.3ML IJ SOSY
30.0000 mg | PREFILLED_SYRINGE | INTRAMUSCULAR | Status: DC
  Administered 2021-04-27: 30 mg via SUBCUTANEOUS
  Filled 2021-04-27: qty 0.3

## 2021-04-27 NOTE — Progress Notes (Signed)
Brenda Stewart   DOB:04-13-1951   [id number]    ASSESSMENT & PLAN:  Peritoneal carcinomatosis, elevated CA125, subacute bowel obstruction, likely due to ovarian or fallopian tube cancer, biopsy pending Pathology result is not available; I spoke with pathologist briefly, he felt only a few cells are available for analysis, may not be diagnostic I spoke with IR and the patient, placed another consult for Ct guided biopsy of omental caking for tissue biopsy She had port placement In the meantime, I recommend continue supportive care Chemo is placed on hold until further results are available   Malignant ascites She will likely have reaccumulation of ascites with aggressive IV fluid hydration Monitor closely   Uncontrolled nausea and vomiting, stable Due to abdominal carcinomatosis This is not likely going to get better without treatment Continue antiemetics as needed For now, she does not need NG tube   Acute renal failure, resolved Due to dehydration and carcinomatosis It is improving Recommend gentle fluid hydration only   Constipation, resolved Continue conservative approach I am concerned about risk of bowel perforation with laxative Monitor closely   Code Status Full code   Goals of care Resolution of bowel obstruction, ability to tolerate oral intake and resolution of nausea   Discharge planning I anticipate she will be in the hospital 5 to 7 days  All questions were answered. The patient knows to call the clinic with any problems, questions or concerns.   The total time spent in the appointment was 25 minutes encounter with patients including review of chart and various tests results, discussions about plan of care and coordination of care plan  Heath Lark, MD 04/27/2021 3:45 PM  Subjective:  I spoke with pathologist today She felt ok, no major pain Unable to tolerate PO intake Had small bowel movement No significant nausea  Objective:  Vitals:   04/27/21  0528 04/27/21 1417  BP: 124/90 (!) 123/92  Pulse: 94 94  Resp: 18 16  Temp: 98.1 F (36.7 C) 98.1 F (36.7 C)  SpO2: 92% 94%     Intake/Output Summary (Last 24 hours) at 04/27/2021 1545 Last data filed at 04/27/2021 1518 Gross per 24 hour  Intake 1179.21 ml  Output 150 ml  Net 1029.21 ml    GENERAL:alert, no distress and comfortable SKIN: skin color, texture, turgor are normal, no rashes or significant lesions EYES: normal, Conjunctiva are pink and non-injected, sclera clear OROPHARYNX:no exudate, no erythema and lips, buccal mucosa, and tongue normal  NECK: supple, thyroid normal size, non-tender, without nodularity LYMPH:  no palpable lymphadenopathy in the cervical, axillary or inguinal LUNGS: clear to auscultation and percussion with normal breathing effort HEART: regular rate & rhythm and no murmurs and no lower extremity edema ABDOMEN:abdomen soft, palpable tissue firmness Musculoskeletal:no cyanosis of digits and no clubbing  NEURO: alert & oriented x 3 with fluent speech, no focal motor/sensory deficits   Labs:  Recent Labs    05/20/20 1032 04/21/21 1413 04/22/21 0509 04/24/21 0056 04/25/21 2343 04/27/21 0453  NA 142 132*   < > 135 136 136  K 3.5 3.8   < > 4.0 3.5 3.6  CL 104 94*   < > 99 100 103  CO2 28 23   < > '24 26 27  '$ GLUCOSE 113* 110*   < > 105* 104* 116*  BUN 10 17   < > '16 11 10  '$ CREATININE 1.03* 2.57*   < > 1.26* 1.07* 0.93  CALCIUM 9.4 9.5   < > 9.1  8.7* 8.6*  GFRNONAA 56* 20*   < > 47* 57* >60  GFRAA >60  --   --   --   --   --   PROT 7.9 8.1  --   --   --  6.2*  ALBUMIN 4.1 3.5  --   --   --  2.9*  AST 20 12*  --   --   --  14*  ALT 14 10  --   --   --  8  ALKPHOS 73 77  --   --   --  49  BILITOT 0.6 0.7  --   --   --  0.6   < > = values in this interval not displayed.    Studies:  CT ABDOMEN PELVIS WO CONTRAST  Result Date: 04/21/2021 CLINICAL DATA:  Nausea and vomiting for 2 days. Lower abdominal pain for approximately 3 months. Chronic  constipation. EXAM: CT ABDOMEN AND PELVIS WITHOUT CONTRAST TECHNIQUE: Multidetector CT imaging of the abdomen and pelvis was performed following the standard protocol without IV contrast. COMPARISON:  None. FINDINGS: Lower chest: No acute findings. Hepatobiliary: No mass visualized on this unenhanced exam. Gallbladder is unremarkable. No evidence of biliary ductal dilatation. Pancreas: No mass or inflammatory process visualized on this unenhanced exam. Spleen:  Within normal limits in size. Adrenals/Urinary tract: No evidence of urolithiasis or hydronephrosis. Unremarkable unopacified urinary bladder. Stomach/Bowel: Small hiatal hernia is seen containing ascites. Moderate diffuse small bowel dilatation is seen, with suspected transition point in the right lower quadrant, suspicious for distal small bowel obstruction. Vascular/Lymphatic: No pathologically enlarged lymph nodes identified. No evidence of abdominal aortic aneurysm. Aortic atherosclerotic calcification noted. Reproductive: Prior hysterectomy suspected. Moderate ascites is seen. Omental caking and diffuse mesenteric soft tissue stranding is seen. There is also probable peritoneal nodularity along the left pelvic sidewall. These findings are highly suspicious for peritoneal carcinomatosis. Other:  None. Musculoskeletal:  No suspicious bone lesions identified. 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 Suspected distal small bowel obstruction, with probable transition point in right lower quadrant. Aortic Atherosclerosis (ICD10-I70.0). Electronically Signed   By: Marlaine Hind M.D.   On: 04/21/2021 18:36   US Abdomen Limited  Result Date: 04/22/2021 CLINICAL DATA:  Peritoneal neoplasm abdominal distension, recurrent ascites EXAM: LIMITED ABDOMEN ULTRASOUND FOR ASCITES TECHNIQUE: Limited ultrasound survey for ascites was performed in all four abdominal quadrants. COMPARISON:  04/21/2021 CT FINDINGS: Survey of the  abdominal 4 quadrants demonstrates a small volume of lower abdominal ascites by ultrasound. Unable to perform paracentesis because of intractable nausea vomiting today. IMPRESSION: Small volume of lower abdominopelvic ascites. Will reassess for paracentesis tomorrow 04/23/2021. Electronically Signed   By: Jerilynn Mages.  Shick M.D.   On: 04/22/2021 12:09   DG Abd 2 Views  Result Date: 04/26/2021 CLINICAL DATA:  Follow-up small bowel obstruction. EXAM: ABDOMEN - 2 VIEW COMPARISON:  A2 22 FINDINGS: There is mild residual gaseous distension of multiple small bowel loops, decreased from prior. A small amount of gas is scattered in the colon and rectum. No intraperitoneal free air is identified. Atelectasis is noted in the left lung base. IMPRESSION: Decreased small bowel distension compatible with improving obstruction. Electronically Signed   By: Logan Bores M.D.   On: 04/26/2021 12:45   DG Abd Portable 1V  Result Date: 04/24/2021 CLINICAL DATA:  Small-bowel obstruction. EXAM: PORTABLE ABDOMEN - 1 VIEW COMPARISON:  04/22/2021.  CT 04/21/2021. FINDINGS: Mild-to-moderately distended loops of small bowel with a relative paucity of colonic gas again  noted. Findings again consistent with small bowel obstruction. No free air. Ascites most likely present. Pelvic calcifications consistent phleboliths. Degenerative changes lumbar spine and both hips. IMPRESSION: Mild-to-moderate small-bowel distention with a paucity of intra colonic air again noted. Findings again consistent with small bowel obstruction. Ascites most likely present. Electronically Signed   By: Marcello Moores  Register   On: 04/24/2021 11:08   DG Abd Portable 1V-Small Bowel Obstruction Protocol-initial, 8 hr delay  Result Date: 04/22/2021 CLINICAL DATA:  Small bowel obstruction, 8 hour film. EXAM: PORTABLE ABDOMEN - 1 VIEW COMPARISON:  CT yesterday. FINDINGS: Administered enteric contrast has reached the ascending and proximal transverse colon. There is enteric contrast  within prominent small bowel in the central abdomen. Left upper quadrant contrast may be residual within small bowel or at the splenic flexure. No evidence of free air. IMPRESSION: Administered enteric contrast has reached the ascending and proximal transverse colon. There is enteric contrast within prominent small bowel in the central abdomen. Left upper quadrant contrast may be residual within small bowel or at the splenic flexure. Electronically Signed   By: Keith Rake M.D.   On: 04/22/2021 17:38   IR IMAGING GUIDED PORT INSERTION  Result Date: 04/26/2021 CLINICAL DATA:  Peritoneal carcinomatosis, unknown primary, access for chemotherapy EXAM: RIGHT INTERNAL JUGULAR SINGLE LUMEN POWER PORT CATHETER INSERTION Date:  04/26/2021 04/26/2021 2:15 pm Radiologist:  M. Daryll Brod, MD Guidance:  Ultrasound and fluoroscopic MEDICATIONS: 1% lidocaine with epinephrine local ANESTHESIA/SEDATION: Versed 2.0 mg IV; Fentanyl 100 mcg IV; Moderate Sedation Time:  25 minutes The patient was continuously monitored during the procedure by the interventional radiology nurse under my direct supervision. FLUOROSCOPY TIME:  One minutes, 0 seconds (5 mGy) COMPLICATIONS: None immediate. CONTRAST:  None. PROCEDURE: Informed consent was obtained from the patient following explanation of the procedure, risks, benefits and alternatives. The patient understands, agrees and consents for the procedure. All questions were addressed. A time out was performed. Maximal barrier sterile technique utilized including caps, mask, sterile gowns, sterile gloves, large sterile drape, hand hygiene, and 2% chlorhexidine scrub. Under sterile conditions and local anesthesia, right internal jugular micropuncture venous access was performed. Access was performed with ultrasound. Images were obtained for documentation of the patent right internal jugular vein. A guide wire was inserted followed by a transitional dilator. This allowed insertion of a guide  wire and catheter into the IVC. Measurements were obtained from the SVC / RA junction back to the right IJ venotomy site. In the right infraclavicular chest, a subcutaneous pocket was created over the second anterior rib. This was done under sterile conditions and local anesthesia. 1% lidocaine with epinephrine was utilized for this. A 2.5 cm incision was made in the skin. Blunt dissection was performed to create a subcutaneous pocket over the right pectoralis major muscle. The pocket was flushed with saline vigorously. There was adequate hemostasis. The port catheter was assembled and checked for leakage. The port catheter was secured in the pocket with two retention sutures. The tubing was tunneled subcutaneously to the right venotomy site and inserted into the SVC/RA junction through a valved peel-away sheath. Position was confirmed with fluoroscopy. Images were obtained for documentation. The patient tolerated the procedure well. No immediate complications. Incisions were closed in a two layer fashion with 4 - 0 Vicryl suture. Dermabond was applied to the skin. The port catheter was accessed, blood was aspirated followed by saline and heparin flushes. Needle was removed. A dry sterile dressing was applied. IMPRESSION: Ultrasound and fluoroscopically guided right  internal jugular single lumen power port catheter insertion. Tip in the SVC/RA junction. Catheter ready for use. Electronically Signed   By: Jerilynn Mages.  Shick M.D.   On: 04/26/2021 14:22   IR Paracentesis  Result Date: 04/24/2021 INDICATION: Abdominal distention secondary to ascites. Underlying concern for peritoneal carcinomatosis on imaging findings. Request for diagnostic and therapeutic paracentesis. EXAM: ULTRASOUND GUIDED LEFT LOWER QUADRANT PARACENTESIS MEDICATIONS: 1% plain lidocaine, 5 mL COMPLICATIONS: None immediate. PROCEDURE: Informed written consent was obtained from the patient after a discussion of the risks, benefits and alternatives to  treatment. A timeout was performed prior to the initiation of the procedure. Initial ultrasound scanning demonstrates a large amount of ascites within the left lower abdominal quadrant. The left lower abdomen was prepped and draped in the usual sterile fashion. 1% lidocaine was used for local anesthesia. Following this, a 19 gauge, 7-cm, Yueh catheter was introduced. An ultrasound image was saved for documentation purposes. The paracentesis was performed. The catheter was removed and a dressing was applied. The patient tolerated the procedure well without immediate post procedural complication. FINDINGS: A total of approximately 2.1 L of clear yellow fluid was removed. Samples were sent to the laboratory as requested by the clinical team. IMPRESSION: Successful ultrasound-guided paracentesis yielding 2.1 liters of peritoneal fluid. Read by: Ascencion Dike PA-C Electronically Signed   By: Michaelle Birks MD   On: 04/24/2021 12:38

## 2021-04-27 NOTE — Progress Notes (Signed)
Patient ID: Brenda Stewart, female   DOB: 1951/08/13, 70 y.o.   MRN: [id number]     Subjective: Put on a regular diet after her port placement yesterday.  Had vomiting after eating spaghetti.  Feels better now that she is NPO again.  No real bowel function  ROS negative except as listed above. Objective: Vital signs in last 24 hours: Temp:  [98.1 F (36.7 C)-98.3 F (36.8 C)] 98.1 F (36.7 C) (08/05 0528) Pulse Rate:  [94-103] 94 (08/05 0528) Resp:  [0-25] 18 (08/05 0528) BP: (112-141)/(72-96) 124/90 (08/05 0528) SpO2:  [92 %-100 %] 92 % (08/05 0528) Last BM Date: 04/25/21  Intake/Output from previous day: 08/04 0701 - 08/05 0700 In: 408 [IV Piggyback:408] Out: 150 [Emesis/NG output:150] Intake/Output this shift: No intake/output data recorded.  PE: Abd: soft, mildly tender, some BS, nondistended appearing  Lab Results: CBC  Recent Labs    04/25/21 2343 04/27/21 0453  WBC 9.2 9.3  HGB 11.8* 10.9*  HCT 34.9* 33.0*  PLT 360 311   BMET Recent Labs    04/25/21 2343 04/27/21 0453  NA 136 136  K 3.5 3.6  CL 100 103  CO2 26 27  GLUCOSE 104* 116*  BUN 11 10  CREATININE 1.07* 0.93  CALCIUM 8.7* 8.6*   PT/INR No results for input(s): LABPROT, INR in the last 72 hours. ABG No results for input(s): PHART, HCO3 in the last 72 hours.  Invalid input(s): PCO2, PO2  Studies/Results: DG Abd 2 Views  Result Date: 04/26/2021 CLINICAL DATA:  Follow-up small bowel obstruction. EXAM: ABDOMEN - 2 VIEW COMPARISON:  A2 22 FINDINGS: There is mild residual gaseous distension of multiple small bowel loops, decreased from prior. A small amount of gas is scattered in the colon and rectum. No intraperitoneal free air is identified. Atelectasis is noted in the left lung base. IMPRESSION: Decreased small bowel distension compatible with improving obstruction. Electronically Signed   By: Logan Bores M.D.   On: 04/26/2021 12:45   IR IMAGING GUIDED PORT INSERTION  Result Date:  04/26/2021 CLINICAL DATA:  Peritoneal carcinomatosis, unknown primary, access for chemotherapy EXAM: RIGHT INTERNAL JUGULAR SINGLE LUMEN POWER PORT CATHETER INSERTION Date:  04/26/2021 04/26/2021 2:15 pm Radiologist:  M. Daryll Brod, MD Guidance:  Ultrasound and fluoroscopic MEDICATIONS: 1% lidocaine with epinephrine local ANESTHESIA/SEDATION: Versed 2.0 mg IV; Fentanyl 100 mcg IV; Moderate Sedation Time:  25 minutes The patient was continuously monitored during the procedure by the interventional radiology nurse under my direct supervision. FLUOROSCOPY TIME:  One minutes, 0 seconds (5 mGy) COMPLICATIONS: None immediate. CONTRAST:  None. PROCEDURE: Informed consent was obtained from the patient following explanation of the procedure, risks, benefits and alternatives. The patient understands, agrees and consents for the procedure. All questions were addressed. A time out was performed. Maximal barrier sterile technique utilized including caps, mask, sterile gowns, sterile gloves, large sterile drape, hand hygiene, and 2% chlorhexidine scrub. Under sterile conditions and local anesthesia, right internal jugular micropuncture venous access was performed. Access was performed with ultrasound. Images were obtained for documentation of the patent right internal jugular vein. A guide wire was inserted followed by a transitional dilator. This allowed insertion of a guide wire and catheter into the IVC. Measurements were obtained from the SVC / RA junction back to the right IJ venotomy site. In the right infraclavicular chest, a subcutaneous pocket was created over the second anterior rib. This was done under sterile conditions and local anesthesia. 1% lidocaine with epinephrine was utilized for this.  A 2.5 cm incision was made in the skin. Blunt dissection was performed to create a subcutaneous pocket over the right pectoralis major muscle. The pocket was flushed with saline vigorously. There was adequate hemostasis. The port  catheter was assembled and checked for leakage. The port catheter was secured in the pocket with two retention sutures. The tubing was tunneled subcutaneously to the right venotomy site and inserted into the SVC/RA junction through a valved peel-away sheath. Position was confirmed with fluoroscopy. Images were obtained for documentation. The patient tolerated the procedure well. No immediate complications. Incisions were closed in a two layer fashion with 4 - 0 Vicryl suture. Dermabond was applied to the skin. The port catheter was accessed, blood was aspirated followed by saline and heparin flushes. Needle was removed. A dry sterile dressing was applied. IMPRESSION: Ultrasound and fluoroscopically guided right internal jugular single lumen power port catheter insertion. Tip in the SVC/RA junction. Catheter ready for use. Electronically Signed   By: Jerilynn Mages.  Shick M.D.   On: 04/26/2021 14:22    Anti-infectives: Anti-infectives (From admission, onward)    None       Assessment/Plan: SBO likely due to carcinomatosis - SBO protocol, with contrast in colon -this is likely a high-grade PSBO that may not improve given etiology, unless it responds to chemotherapy. -patient has now failed 3 attempts at a diet as she keep vomiting.  She tolerates NPO status with no vomiting so think we can avoid NGT, but in order for her to get nutrition to try and accomplish goal of chemotherapy she is likely going to need TNA. -defer to onc/primary team as usually we aren't able to use port for TNA while getting chemo.  If that's the case, she may need a picc on opposite site of port?? -no surgical plans at this time given carcinomatosis and one resection may not correct problem of obstruction. -cytology from para still pending.  If negative, recommend IR for biopsy of possible soft tissue areas noted on CT scan -given no surgery to offer patient, we will sign off at this time.  See above for nutritional recommendations and  will allow onc/primary to determine how they want to proceed with this.  FEN - NPO, may have ice and sips of clears as tolerates  VTE - heparin ID- none needed  HTN GERD   LOS: 6 days    Henreitta Cea PA-C Trauma & General Surgery Use [email or web address] to contact on call provider  04/27/2021

## 2021-04-27 NOTE — Care Management Important Message (Signed)
Important Message  Patient Details IM Letter given to the Patient. Name: Brenda Stewart MRN: [id number] Date of Birth: Jul 28, 1951   Medicare Important Message Given:  Yes     [name] 04/27/2021, 10:34 AM

## 2021-04-27 NOTE — Progress Notes (Addendum)
PROGRESS NOTE    Brenda Stewart  [id number] DOB: 1951/05/03 DOA: 04/21/2021 PCP: Pcp, No    No chief complaint on file.   Brief Narrative:  Brenda Stewart is a 70 y/o F with HTN, GERD who presented with abdominal pain and was found to have SBO due to peritoneal carcinomatosis.  General surgery and oncology consulted and following.  Patient transferred to [facility] long hospital to start chemotherapy.   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 -Biopsies pending. -Cytology pending. -Patient underwent trial of clear liquids x2 with nausea and emesis and currently n.p.o. except ice chips. -Abdominal films done 04/26/2021 with decreased small bowel distention compatible with improving obstruction. -Patient with no flatus, no bowel movement. -Patient stated had multiple bouts of emesis and nausea overnight. -Patient stated NG tube placement was attempted at [facility] x3 per RN which was unsuccessful and patient very hesitant/resistant for NG tube placement if needed at this time. -Increase IV fluids to 75 cc an hour for the next 24 hours and then decrease back to 50 cc an hour. -General surgery following and appreciate input and recommendations.  2.  Peritoneal carcinomatosis/elevated CA125/concern for ovarian or fallopian tube cancer with biopsies pending -Pathology report pending per oncology note. -Status post paracentesis with cytology pending. -Patient status post port placement 04/26/2021. -Oncology following and hoping to start  patient on chemotherapy with carboplatin and paclitaxel today -Per oncology.  3.  Malignant ascites -Status post paracentesis. -Concern for reaccumulation of ascites. -Cytology pending. -Increase IV fluid to 75 cc an hour for the next 24 hours and then back down to 50 cc an hour. -Per oncology.  4.  Acute renal failure -Secondary to prerenal azotemia. -Improved with hydration. -Increase IV fluid rate to 75 cc an hour for the next 24 hours and then back down to 50 cc an hour at due to dehydration and significant emesis overnight per patient.  -Follow.  5.  Prolonged QT interval -Repeat EKG with resolution of QT prolongation. -Keep potassium close to 4, magnesium close to 2. -Follow.  6.  Severe protein calorie malnutrition/unintended weight loss -Likely secondary to malignancy/#2. -Patient currently n.p.o. secondary to problem #1. -If no significant improvement may need to be started on TNA and may require a separate PICC line as patient to receive chemotherapy through port. -We will defer initiation of TNA to oncology.  7.  Thrombocytosis -Resolved.  8.  Hypomagnesemia -Repleted.  Magnesium at 2.1.   9.  Hyponatremia -Resolved with hydration.  10.  Dehydration -Gentle hydration.  11.  Anemia -Check an anemia panel. -Follow H&H. -Transfusion threshold hemoglobin < 7.     DVT prophylaxis: Heparin>>>> Lovenox Code Status: Full Family Communication: Updated patient.  No family at bedside. Disposition:   Status is: Inpatient  Remains inpatient appropriate because:Inpatient level of care appropriate due to severity of illness  Dispo: The patient is from: Home              Anticipated d/c is to: Home  Patient currently is not medically stable to d/c.   Difficult to place patient No       Consultants:  Neurosurgery: Dr. Rosendo Gros 04/21/2020 Oncology: Dr. Lorna Few 04/22/2020 GYN oncology: Dr. Denman George 04/24/2020  Procedures:  CT abdomen pelvis  04/21/2021 Abdominal films 04/22/2021, 04/24/2021, 04/26/2021 Abdominal ultrasound 04/22/2021 Ultrasound-guided left lower quadrant paracentesis--2.1 L of peritoneal fluid removed, per IR, Dr.Mugweru  Right IJ PowerPort placed per IR, Dr. Annamaria Boots 04/26/2021    Antimicrobials: None   Subjective: Patient states spent all night with nausea and emesis.  Patient stated finally got some rest this morning and just woke up.  Denies any further emesis this morning.  Denies any significant abdominal pain.  No flatus.  No bowel movement.  No shortness of breath.  No chest pain.  Stated NG tube was attempted 3 times and [facility] by RN which was unsuccessful and patient very resistant/hesitant for NG tube placement if needed.   Objective: Vitals:   04/26/21 1418 04/26/21 1436 04/26/21 1945 04/27/21 0528  BP: 122/79 117/72 119/80 124/90  Pulse:  98 99 94  Resp:  '15 18 18  '$ Temp:  98.2 F (36.8 C) 98.3 F (36.8 C) 98.1 F (36.7 C)  TempSrc:  Oral Oral Oral  SpO2:  95% 98% 92%  Weight:      Height:        Intake/Output Summary (Last 24 hours) at 04/27/2021 1010 Last data filed at 04/26/2021 2302 Gross per 24 hour  Intake 408.01 ml  Output 150 ml  Net 258.01 ml    Filed Weights   04/21/21 1704 04/24/21 0546  Weight: 77.6 kg 77 kg    Examination:  General exam: : NAD Respiratory system: CTA B.  No wheezes, no rhonchi.  Speaking in full sentences.  Normal respiratory effort. Cardiovascular system: Regular rate and rhythm no murmurs rubs or gallops.  No JVD.  No lower extremity edema.  Gastrointestinal system: Abdomen soft, nondistended, positive bowel sounds.  Some tenderness to palpation in lower abdominal region.  No rebound.  No guarding. Central nervous system: Alert and oriented. No focal neurological deficits. Extremities: Symmetric 5 x 5 power. Skin: No rashes, lesions or ulcers Psychiatry: Judgement and insight appear normal. Mood & affect appropriate.  Data Reviewed: I have  personally reviewed following labs and imaging studies  CBC: Recent Labs  Lab 04/21/21 1413 04/22/21 0509 04/23/21 0825 04/24/21 0056 04/25/21 2343 04/27/21 0453  WBC 13.0* 10.8* 9.7 9.3 9.2 9.3  NEUTROABS 10.1*  --   --   --   --  7.1  HGB 12.8 11.3* 10.9* 11.0* 11.8* 10.9*  HCT 38.6 33.9* 32.1* 32.8* 34.9* 33.0*  MCV 92.8 92.6 91.5 91.6 90.6 91.4  PLT 535* 445* 413* 396 360 311     Basic Metabolic Panel: Recent Labs  Lab 04/21/21 1413 04/22/21 0509 04/23/21 0825 04/24/21 0056 04/25/21 2343 04/26/21 0914 04/27/21 0453  NA 132* 135 135 135 136  --  136  K 3.8 3.9 3.7 4.0 3.5  --  3.6  CL 94* 99 102 99 100  --  103  CO2 '23 23 22 24 26  '$ --  27  GLUCOSE 110* 104* 92 105* 104*  --  116*  BUN '17 21 18 16 11  '$ --  10  CREATININE 2.57* 2.35* 1.41* 1.26* 1.07*  --  0.93  CALCIUM 9.5 9.0 9.0 9.1 8.7*  --  8.6*  MG 1.8  --   --   --   --  1.5* 2.1  PHOS  --   --   --   --   --   --  3.3     GFR: Estimated Creatinine Clearance: 63.2 mL/min (by C-G formula based on SCr of 0.93 mg/dL).  Liver Function Tests: Recent Labs  Lab 04/21/21 1413 04/27/21 0453  AST 12* 14*  ALT 10 8  ALKPHOS 77 49  BILITOT 0.7 0.6  PROT 8.1 6.2*  ALBUMIN 3.5 2.9*     CBG: No results for input(s): GLUCAP in the last 168 hours.   Recent Results (from the past 240 hour(s))  Resp Panel by RT-PCR (Flu A&B, Covid) Nasopharyngeal Swab     Status: None   Collection Time: 04/21/21  6:49 PM   Specimen: Nasopharyngeal Swab; Nasopharyngeal(NP) swabs in vial transport medium  Result Value Ref Range Status   SARS Coronavirus 2 by RT PCR NEGATIVE NEGATIVE Final    Comment: (NOTE) SARS-CoV-2 target nucleic acids are NOT DETECTED.  The SARS-CoV-2 RNA is generally detectable in upper respiratory specimens during the acute phase of infection. The lowest concentration of SARS-CoV-2 viral copies this assay can detect is 138 copies/mL. A negative result does not preclude SARS-Cov-2 infection and  should not be used as the sole basis for treatment or other patient management decisions. A negative result may occur with  improper specimen collection/handling, submission of specimen other than nasopharyngeal swab, presence of viral mutation(s) within the areas targeted by this assay, and inadequate number of viral copies(<138 copies/mL). A negative result must be combined with clinical observations, patient history, and epidemiological information. The expected result is Negative.  Fact Sheet for Patients:  EntrepreneurPulse.com.au  Fact Sheet for Healthcare Providers:  IncredibleEmployment.be  This test is no t yet approved or cleared by the [place] FDA and  has been authorized for detection and/or diagnosis of SARS-CoV-2 by FDA under an Emergency Use Authorization (EUA). This EUA will remain  in effect (meaning this test can be used) for the duration of the COVID-19 declaration under Section 564(b)(1) of the Act, 21 U.S.C.section 360bbb-3(b)(1), unless the authorization is terminated  or revoked sooner.       Influenza A by PCR NEGATIVE NEGATIVE Final   Influenza B by PCR NEGATIVE NEGATIVE Final    Comment: (NOTE) The Xpert Xpress SARS-CoV-2/FLU/RSV plus assay is intended as an aid in the diagnosis of influenza from Nasopharyngeal swab specimens and should not be used as a sole basis for treatment. Nasal washings and aspirates are unacceptable for Xpert Xpress SARS-CoV-2/FLU/RSV testing.  Fact Sheet for Patients: EntrepreneurPulse.com.au  Fact Sheet for Healthcare Providers: IncredibleEmployment.be  This test is not yet approved or cleared by the [place] FDA and has been authorized for detection and/or diagnosis of SARS-CoV-2 by FDA under an Emergency Use Authorization (EUA). This EUA will remain in effect (meaning this test can be used) for the duration of the COVID-19 declaration  under Section 564(b)(1) of the Act, 21 U.S.C. section 360bbb-3(b)(1), unless the authorization is terminated or revoked.  Performed at [facility], [place] [address], [place], [place] [address]   Body fluid culture w Gram Stain     Status: None   Collection Time: 04/24/21  1:55 PM   Specimen: Abdomen; Peritoneal Fluid  Result Value Ref Range Status   Specimen Description PERITONEAL FLUID  Final   Special Requests ABDOMEN  Final   Gram Stain   Final    WBC PRESENT, PREDOMINANTLY MONONUCLEAR NO ORGANISMS SEEN CYTOSPIN SMEAR    Culture  Final    NO GROWTH 3 DAYS Performed at [facility], [place] [address], [place], [place] [address]    Report Status 04/27/2021 FINAL  Final          Radiology Studies: DG Abd 2 Views  Result Date: 04/26/2021 CLINICAL DATA:  Follow-up small bowel obstruction. EXAM: ABDOMEN - 2 VIEW COMPARISON:  A2 22 FINDINGS: There is mild residual gaseous distension of multiple small bowel loops, decreased from prior. A small amount of gas is scattered in the colon and rectum. No intraperitoneal free air is identified. Atelectasis is noted in the left lung base. IMPRESSION: Decreased small bowel distension compatible with improving obstruction. Electronically Signed   By: Logan Bores M.D.   On: 04/26/2021 12:45   IR IMAGING GUIDED PORT INSERTION  Result Date: 04/26/2021 CLINICAL DATA:  Peritoneal carcinomatosis, unknown primary, access for chemotherapy EXAM: RIGHT INTERNAL JUGULAR SINGLE LUMEN POWER PORT CATHETER INSERTION Date:  04/26/2021 04/26/2021 2:15 pm Radiologist:  M. Daryll Brod, MD Guidance:  Ultrasound and fluoroscopic MEDICATIONS: 1% lidocaine with epinephrine local ANESTHESIA/SEDATION: Versed 2.0 mg IV; Fentanyl 100 mcg IV; Moderate Sedation Time:  25 minutes The patient was continuously monitored during the procedure by the interventional radiology nurse under my direct supervision. FLUOROSCOPY TIME:  One minutes, 0 seconds (5 mGy) COMPLICATIONS:  None immediate. CONTRAST:  None. PROCEDURE: Informed consent was obtained from the patient following explanation of the procedure, risks, benefits and alternatives. The patient understands, agrees and consents for the procedure. All questions were addressed. A time out was performed. Maximal barrier sterile technique utilized including caps, mask, sterile gowns, sterile gloves, large sterile drape, hand hygiene, and 2% chlorhexidine scrub. Under sterile conditions and local anesthesia, right internal jugular micropuncture venous access was performed. Access was performed with ultrasound. Images were obtained for documentation of the patent right internal jugular vein. A guide wire was inserted followed by a transitional dilator. This allowed insertion of a guide wire and catheter into the IVC. Measurements were obtained from the SVC / RA junction back to the right IJ venotomy site. In the right infraclavicular chest, a subcutaneous pocket was created over the second anterior rib. This was done under sterile conditions and local anesthesia. 1% lidocaine with epinephrine was utilized for this. A 2.5 cm incision was made in the skin. Blunt dissection was performed to create a subcutaneous pocket over the right pectoralis major muscle. The pocket was flushed with saline vigorously. There was adequate hemostasis. The port catheter was assembled and checked for leakage. The port catheter was secured in the pocket with two retention sutures. The tubing was tunneled subcutaneously to the right venotomy site and inserted into the SVC/RA junction through a valved peel-away sheath. Position was confirmed with fluoroscopy. Images were obtained for documentation. The patient tolerated the procedure well. No immediate complications. Incisions were closed in a two layer fashion with 4 - 0 Vicryl suture. Dermabond was applied to the skin. The port catheter was accessed, blood was aspirated followed by saline and heparin flushes.  Needle was removed. A dry sterile dressing was applied. IMPRESSION: Ultrasound and fluoroscopically guided right internal jugular single lumen power port catheter insertion. Tip in the SVC/RA junction. Catheter ready for use. Electronically Signed   By: Jerilynn Mages.  Shick M.D.   On: 04/26/2021 14:22        Scheduled Meds:  (feeding supplement) PROSource Plus  30 mL Oral TID BM   Chlorhexidine Gluconate Cloth  6 each Topical Daily   feeding supplement  1 Container Oral TID BM   heparin  5,000 Units Subcutaneous Q8H   multivitamin with minerals  1 tablet Oral Daily   Continuous Infusions:  lactated ringers 50 mL/hr at 04/27/21 0416   potassium chloride 10 mEq (04/27/21 0958)     LOS: 6 days    Time spent: 35 minutes    Irine Seal, MD [facility]   To contact the attending provider between 7A-7P or the covering provider during after hours 7P-7A, please log into the web site [email or web address] and access using universal [place] password for that web site. If you do not have the password, please call the hospital operator.  04/27/2021, 10:10 AM

## 2021-04-28 ENCOUNTER — Inpatient Hospital Stay (HOSPITAL_COMMUNITY): Payer: Medicare Other

## 2021-04-28 DIAGNOSIS — E871 Hypo-osmolality and hyponatremia: Secondary | ICD-10-CM | POA: Diagnosis not present

## 2021-04-28 DIAGNOSIS — K56609 Unspecified intestinal obstruction, unspecified as to partial versus complete obstruction: Secondary | ICD-10-CM | POA: Diagnosis not present

## 2021-04-28 DIAGNOSIS — R180 Malignant ascites: Secondary | ICD-10-CM | POA: Diagnosis not present

## 2021-04-28 DIAGNOSIS — N179 Acute kidney failure, unspecified: Secondary | ICD-10-CM | POA: Diagnosis not present

## 2021-04-28 LAB — CBC
HCT: 32.5 % — ABNORMAL LOW (ref 36.0–46.0)
Hemoglobin: 10.7 g/dL — ABNORMAL LOW (ref 12.0–15.0)
MCH: 30.2 pg (ref 26.0–34.0)
MCHC: 32.9 g/dL (ref 30.0–36.0)
MCV: 91.8 fL (ref 80.0–100.0)
Platelets: 289 10*3/uL (ref 150–400)
RBC: 3.54 MIL/uL — ABNORMAL LOW (ref 3.87–5.11)
RDW: 13.1 % (ref 11.5–15.5)
WBC: 9.4 10*3/uL (ref 4.0–10.5)
nRBC: 0 % (ref 0.0–0.2)

## 2021-04-28 LAB — BASIC METABOLIC PANEL
Anion gap: 10 (ref 5–15)
BUN: 9 mg/dL (ref 8–23)
CO2: 24 mmol/L (ref 22–32)
Calcium: 8.3 mg/dL — ABNORMAL LOW (ref 8.9–10.3)
Chloride: 100 mmol/L (ref 98–111)
Creatinine, Ser: 0.93 mg/dL (ref 0.44–1.00)
GFR, Estimated: >60 mL/min (ref >60)
Glucose, Bld: 94 mg/dL (ref 70–99)
Potassium: 4 mmol/L (ref 3.5–5.1)
Sodium: 134 mmol/L — ABNORMAL LOW (ref 135–145)

## 2021-04-28 LAB — GLUCOSE, CAPILLARY: Glucose-Capillary: 92 mg/dL (ref 70–99)

## 2021-04-28 LAB — MAGNESIUM: Magnesium: 1.7 mg/dL (ref 1.7–2.4)

## 2021-04-28 MED ORDER — MAGNESIUM SULFATE 4 GM/100ML IV SOLN
4.0000 g | Freq: Once | INTRAVENOUS | Status: AC
  Administered 2021-04-28: 4 g via INTRAVENOUS
  Filled 2021-04-28: qty 100

## 2021-04-28 MED ORDER — ALUM & MAG HYDROXIDE-SIMETH 200-200-20 MG/5ML PO SUSP
30.0000 mL | Freq: Once | ORAL | Status: AC
  Administered 2021-04-28: 30 mL via ORAL
  Filled 2021-04-28: qty 30

## 2021-04-28 MED ORDER — LIDOCAINE VISCOUS HCL 2 % MT SOLN
15.0000 mL | Freq: Four times a day (QID) | OROMUCOSAL | Status: DC | PRN
  Filled 2021-04-28: qty 15

## 2021-04-28 MED ORDER — ALUM & MAG HYDROXIDE-SIMETH 200-200-20 MG/5ML PO SUSP: 30.0000 mL | Freq: Four times a day (QID) | ORAL | Status: DC | PRN

## 2021-04-28 MED ORDER — SODIUM CHLORIDE 0.9 % IV SOLN
1.0000 mg | Freq: Every day | INTRAVENOUS | Status: DC
  Administered 2021-04-28 – 2021-05-02 (×5): 1 mg via INTRAVENOUS
  Filled 2021-04-28 (×6): qty 0.2

## 2021-04-28 MED ORDER — LIDOCAINE VISCOUS HCL 2 % MT SOLN
15.0000 mL | Freq: Once | OROMUCOSAL | Status: AC
  Administered 2021-04-28: 15 mL via ORAL
  Filled 2021-04-28 (×2): qty 15

## 2021-04-28 MED ORDER — PANTOPRAZOLE SODIUM 40 MG IV SOLR
40.0000 mg | Freq: Every day | INTRAVENOUS | Status: DC
  Administered 2021-04-28 – 2021-05-06 (×9): 40 mg via INTRAVENOUS
  Filled 2021-04-28 (×7): qty 40

## 2021-04-28 NOTE — Progress Notes (Signed)
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, biopsy pending Pathology result is not available; I spoke with pathologist briefly on 04/30/21, he felt only a few cells are available for analysis, may not be diagnostic I spoke with IR and the patient, placed another consult for Ct guided biopsy of omental caking for tissue biopsy She had port placement In the meantime, I recommend continue supportive care Chemo is placed on hold until further results are available   Malignant ascites She will likely have reaccumulation of ascites with aggressive IV fluid hydration Monitor closely I reduce IV fluids a little bit   Uncontrolled nausea and vomiting, stable Due to abdominal carcinomatosis This is not likely going to get better without treatment Continue antiemetics as needed For now, she does not need NG tube   Acute renal failure, resolved Due to dehydration and carcinomatosis It is improving Recommend gentle fluid hydration only   Constipation, resolved Continue conservative approach I am concerned about risk of bowel perforation with laxative Monitor closely   Code Status Full code   Goals of care Resolution of bowel obstruction, ability to tolerate oral intake and resolution of nausea   Discharge planning I anticipate she will be in the hospital 5 to 7 days   All questions were answered. The patient knows to call the clinic with any problems, questions or concerns.   The total time spent in the appointment was 15 minutes encounter with patients including review of chart and various tests results, discussions about plan of care and coordination of care plan  Heath Lark, MD 04/28/2021 9:31 AM  Subjective:  She feels okay this morning She has some loose liquid bowel movement without presence of fecal material No pain no  nausea  Objective:  Vitals:   04/27/21 2047 04/28/21 0538  BP: (!) 145/90 (!) 139/94  Pulse: 99 98  Resp: 18 18  Temp: 98.4 F (36.9 C) 98 F (36.7 C)  SpO2: 96% 94%     Intake/Output Summary (Last 24 hours) at 04/28/2021 0931 Last data filed at 04/27/2021 1518 Gross per 24 hour  Intake 771.2 ml  Output --  Net 771.2 ml    GENERAL:alert, no distress and comfortable NEURO: alert & oriented x 3 with fluent speech, no focal motor/sensory deficits   Labs:  Recent Labs    05/20/20 1032 04/21/21 1413 04/22/21 0509 04/25/21 2343 04/27/21 0453 04/28/21 0032  NA 142 132*   < > 136 136 134*  K 3.5 3.8   < > 3.5 3.6 4.0  CL 104 94*   < > 100 103 100  CO2 28 23   < > '26 27 24  '$ GLUCOSE 113* 110*   < > 104* 116* 94  BUN 10 17   < > '11 10 9  '$ CREATININE 1.03* 2.57*   < > 1.07* 0.93 0.93  CALCIUM 9.4 9.5   < > 8.7* 8.6* 8.3*  GFRNONAA 56* 20*   < > 57* >60 >60  GFRAA >60  --   --   --   --   --   PROT 7.9 8.1  --   --  6.2*  --   ALBUMIN 4.1 3.5  --   --  2.9*  --   AST 20 12*  --   --  14*  --   ALT 14 10  --   --  8  --   ALKPHOS 73 77  --   --  49  --   BILITOT 0.6 0.7  --   --  0.6  --    < > = values in this interval not displayed.    Studies:  CT ABDOMEN PELVIS WO CONTRAST  Result Date: 04/21/2021 CLINICAL DATA:  Nausea and vomiting for 2 days. Lower abdominal pain for approximately 3 months. Chronic constipation. EXAM: CT ABDOMEN AND PELVIS WITHOUT CONTRAST TECHNIQUE: Multidetector CT imaging of the abdomen and pelvis was performed following the standard protocol without IV contrast. COMPARISON:  None. FINDINGS: Lower chest: No acute findings. Hepatobiliary: No mass visualized on this unenhanced exam. Gallbladder is unremarkable. No evidence of biliary ductal dilatation. Pancreas: No mass or inflammatory process visualized on this unenhanced exam. Spleen:  Within normal limits in size. Adrenals/Urinary tract: No evidence of urolithiasis or hydronephrosis. Unremarkable  unopacified urinary bladder. Stomach/Bowel: Small hiatal hernia is seen containing ascites. Moderate diffuse small bowel dilatation is seen, with suspected transition point in the right lower quadrant, suspicious for distal small bowel obstruction. Vascular/Lymphatic: No pathologically enlarged lymph nodes identified. No evidence of abdominal aortic aneurysm. Aortic atherosclerotic calcification noted. Reproductive: Prior hysterectomy suspected. Moderate ascites is seen. Omental caking and diffuse mesenteric soft tissue stranding is seen. There is also probable peritoneal nodularity along the left pelvic sidewall. These findings are highly suspicious for peritoneal carcinomatosis. Other:  None. Musculoskeletal:  No suspicious bone lesions identified. 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 Suspected distal small bowel obstruction, with probable transition point in right lower quadrant. Aortic Atherosclerosis (ICD10-I70.0). Electronically Signed   By: Marlaine Hind M.D.   On: 04/21/2021 18:36   US Abdomen Limited  Result Date: 04/22/2021 CLINICAL DATA:  Peritoneal neoplasm abdominal distension, recurrent ascites EXAM: LIMITED ABDOMEN ULTRASOUND FOR ASCITES TECHNIQUE: Limited ultrasound survey for ascites was performed in all four abdominal quadrants. COMPARISON:  04/21/2021 CT FINDINGS: Survey of the abdominal 4 quadrants demonstrates a small volume of lower abdominal ascites by ultrasound. Unable to perform paracentesis because of intractable nausea vomiting today. IMPRESSION: Small volume of lower abdominopelvic ascites. Will reassess for paracentesis tomorrow 04/23/2021. Electronically Signed   By: Jerilynn Mages.  Shick M.D.   On: 04/22/2021 12:09   DG Abd 2 Views  Result Date: 04/26/2021 CLINICAL DATA:  Follow-up small bowel obstruction. EXAM: ABDOMEN - 2 VIEW COMPARISON:  A2 22 FINDINGS: There is mild residual gaseous distension of multiple small bowel loops, decreased from prior.  A small amount of gas is scattered in the colon and rectum. No intraperitoneal free air is identified. Atelectasis is noted in the left lung base. IMPRESSION: Decreased small bowel distension compatible with improving obstruction. Electronically Signed   By: Logan Bores M.D.   On: 04/26/2021 12:45   DG Abd Portable 1V  Result Date: 04/24/2021 CLINICAL DATA:  Small-bowel obstruction. EXAM: PORTABLE ABDOMEN - 1 VIEW COMPARISON:  04/22/2021.  CT 04/21/2021. FINDINGS: Mild-to-moderately distended loops of small bowel with a relative paucity of colonic gas again noted. Findings again consistent with small bowel obstruction. No free air. Ascites most likely present. Pelvic calcifications consistent phleboliths. Degenerative changes lumbar spine and both hips. IMPRESSION: Mild-to-moderate small-bowel distention with a paucity of intra colonic air again noted. Findings again consistent with small bowel obstruction. Ascites most likely present. Electronically Signed   By: Marcello Moores  Register   On: 04/24/2021 11:08   DG Abd Portable 1V-Small Bowel Obstruction Protocol-initial, 8 hr delay  Result Date: 04/22/2021 CLINICAL DATA:  Small  bowel obstruction, 8 hour film. EXAM: PORTABLE ABDOMEN - 1 VIEW COMPARISON:  CT yesterday. FINDINGS: Administered enteric contrast has reached the ascending and proximal transverse colon. There is enteric contrast within prominent small bowel in the central abdomen. Left upper quadrant contrast may be residual within small bowel or at the splenic flexure. No evidence of free air. IMPRESSION: Administered enteric contrast has reached the ascending and proximal transverse colon. There is enteric contrast within prominent small bowel in the central abdomen. Left upper quadrant contrast may be residual within small bowel or at the splenic flexure. Electronically Signed   By: Keith Rake M.D.   On: 04/22/2021 17:38   IR IMAGING GUIDED PORT INSERTION  Result Date: 04/26/2021 CLINICAL DATA:   Peritoneal carcinomatosis, unknown primary, access for chemotherapy EXAM: RIGHT INTERNAL JUGULAR SINGLE LUMEN POWER PORT CATHETER INSERTION Date:  04/26/2021 04/26/2021 2:15 pm Radiologist:  M. Daryll Brod, MD Guidance:  Ultrasound and fluoroscopic MEDICATIONS: 1% lidocaine with epinephrine local ANESTHESIA/SEDATION: Versed 2.0 mg IV; Fentanyl 100 mcg IV; Moderate Sedation Time:  25 minutes The patient was continuously monitored during the procedure by the interventional radiology nurse under my direct supervision. FLUOROSCOPY TIME:  One minutes, 0 seconds (5 mGy) COMPLICATIONS: None immediate. CONTRAST:  None. PROCEDURE: Informed consent was obtained from the patient following explanation of the procedure, risks, benefits and alternatives. The patient understands, agrees and consents for the procedure. All questions were addressed. A time out was performed. Maximal barrier sterile technique utilized including caps, mask, sterile gowns, sterile gloves, large sterile drape, hand hygiene, and 2% chlorhexidine scrub. Under sterile conditions and local anesthesia, right internal jugular micropuncture venous access was performed. Access was performed with ultrasound. Images were obtained for documentation of the patent right internal jugular vein. A guide wire was inserted followed by a transitional dilator. This allowed insertion of a guide wire and catheter into the IVC. Measurements were obtained from the SVC / RA junction back to the right IJ venotomy site. In the right infraclavicular chest, a subcutaneous pocket was created over the second anterior rib. This was done under sterile conditions and local anesthesia. 1% lidocaine with epinephrine was utilized for this. A 2.5 cm incision was made in the skin. Blunt dissection was performed to create a subcutaneous pocket over the right pectoralis major muscle. The pocket was flushed with saline vigorously. There was adequate hemostasis. The port catheter was assembled and  checked for leakage. The port catheter was secured in the pocket with two retention sutures. The tubing was tunneled subcutaneously to the right venotomy site and inserted into the SVC/RA junction through a valved peel-away sheath. Position was confirmed with fluoroscopy. Images were obtained for documentation. The patient tolerated the procedure well. No immediate complications. Incisions were closed in a two layer fashion with 4 - 0 Vicryl suture. Dermabond was applied to the skin. The port catheter was accessed, blood was aspirated followed by saline and heparin flushes. Needle was removed. A dry sterile dressing was applied. IMPRESSION: Ultrasound and fluoroscopically guided right internal jugular single lumen power port catheter insertion. Tip in the SVC/RA junction. Catheter ready for use. Electronically Signed   By: Jerilynn Mages.  Shick M.D.   On: 04/26/2021 14:22   IR Paracentesis  Result Date: 04/24/2021 INDICATION: Abdominal distention secondary to ascites. Underlying concern for peritoneal carcinomatosis on imaging findings. Request for diagnostic and therapeutic paracentesis. EXAM: ULTRASOUND GUIDED LEFT LOWER QUADRANT PARACENTESIS MEDICATIONS: 1% plain lidocaine, 5 mL COMPLICATIONS: None immediate. PROCEDURE: Informed written consent was obtained from the  patient after a discussion of the risks, benefits and alternatives to treatment. A timeout was performed prior to the initiation of the procedure. Initial ultrasound scanning demonstrates a large amount of ascites within the left lower abdominal quadrant. The left lower abdomen was prepped and draped in the usual sterile fashion. 1% lidocaine was used for local anesthesia. Following this, a 19 gauge, 7-cm, Yueh catheter was introduced. An ultrasound image was saved for documentation purposes. The paracentesis was performed. The catheter was removed and a dressing was applied. The patient tolerated the procedure well without immediate post procedural  complication. FINDINGS: A total of approximately 2.1 L of clear yellow fluid was removed. Samples were sent to the laboratory as requested by the clinical team. IMPRESSION: Successful ultrasound-guided paracentesis yielding 2.1 liters of peritoneal fluid. Read by: Ascencion Dike PA-C Electronically Signed   By: Michaelle Birks MD   On: 04/24/2021 12:38

## 2021-04-28 NOTE — Progress Notes (Signed)
IR was requested for image guided bx of omental mass.   Case was discussed with Dr. Serafina Royals, who recommends CT abodmen/pelvis with contrast for further evaluation of biopsy target.   Ordering provider notified via secure chat.  Will review CT AP with for possible bx when done.   Please call IR for questions and concerns.   Armando Gang Doloris Servantes PA-C 04/28/2021 11:18 AM

## 2021-04-28 NOTE — Progress Notes (Addendum)
PROGRESS NOTE    Brenda Stewart  [id number] DOB: Feb 07, 1951 DOA: 04/21/2021 PCP: Pcp, No    No chief complaint on file.   Brief Narrative:  Brenda Stewart is a 70 y/o F with HTN, GERD who presented with abdominal pain and was found to have SBO due to peritoneal carcinomatosis.  General surgery and oncology consulted and following.  Patient transferred to [facility] to start chemotherapy.   Assessment & Plan:   Principal Problem:   SBO (small bowel obstruction) (HCC) Active Problems:   AKI (acute kidney injury) ([place])   Prolonged QT interval   Unintended weight loss   Nausea & vomiting   Hyponatremia   Neoplasm of peritoneum   Leukocytosis   Ascites   Thrombocytosis   Protein-calorie malnutrition, severe   Peritoneal carcinomatosis (HCC)   Hypokalemia  #1 small bowel obstruction -Likely secondary to ovarian or fallopian tube cancer, peritoneal carcinomatosis. -Biopsies pending. -Cytology pending. -Patient underwent trial of clear liquids x2 with nausea and emesis and currently n.p.o. except ice chips. -Abdominal films done 04/26/2021 with decreased small bowel distention compatible with improving obstruction. -Patient with no flatus, liquid stool today.  -Patient stated had multiple bouts of emesis and nausea the evening of 04/26/2021 after being placed on a diet after port placement.  -Patient stated NG tube placement was attempted at [facility] x3 per RN which was unsuccessful and patient very hesitant/resistant for NG tube placement if needed at this time. -Patient with nausea and heartburn after drinking boost this morning.   -Discontinue oral nutritional supplementation due to SBO.   -GI cocktail x1.   -IV PPI daily -Continue IV antiemetics, gentle hydration, supportive care.  -CT-guided biopsy of omental caking for tissue biopsy per IR has been ordered per oncology. -General surgery following and appreciate input and recommendations.  2.   Peritoneal carcinomatosis/elevated CA125/concern for ovarian or fallopian tube cancer with biopsies pending -Pathology report pending per oncology note. -Status post paracentesis with cytology pending. -Patient status post port placement 04/26/2021. -Oncology following and hoping to start patient on chemotherapy with carboplatin and paclitaxel however awaiting biopsies. -Per oncology, not enough cells available for analysis and as such CT-guided biopsy of omental caking for tissue biopsy by IR has been ordered per oncology. -Per oncology.  3.  Malignant ascites -Status post paracentesis. -Concern for reaccumulation of ascites. -Cytology pending. -IV fluids back down to 50 cc an hour.   -Per oncology.   4.  Acute renal failure -Secondary to prerenal azotemia. -Improved with hydration.   -IV fluids have been decreased to 50 cc an hour.   -Follow.    5.  Prolonged QT interval -Repeat EKG with resolution of QT prolongation. -Keep potassium close to 4, magnesium close to 2. -Follow.  6.  Severe protein calorie malnutrition/unintended weight loss -Likely secondary to malignancy/#2. -Patient currently n.p.o. secondary to problem #1. -If no significant improvement may need to be started on TNA and may require a separate PICC line as patient to receive chemotherapy through port. -We will defer initiation of TNA to oncology.  7.  Thrombocytosis -Resolved.  8.  Hypomagnesemia -Magnesium sulfate 4 g IV x1.   -Repeat labs in the morning.    9.  Hyponatremia -Improved with hydration.  10.  Dehydration -Gentle hydration.  11.  Iron deficiency anemia/folate deficiency -Anemia panel consistent with iron deficiency anemia and folate deficiency. -Folate level high at 5.0, iron at 18, ferritin on 415.  Vitamin 123456 at 123456. -Folic acid 1  mg IV daily as patient currently NPO. -May likely benefit from a dose of IV Feraheme during this hospitalization. -Follow H&H.     DVT prophylaxis:  Heparin>>>> Lovenox Code Status: Full Family Communication: Updated patient.  No family at bedside. Disposition:   Status is: Inpatient  Remains inpatient appropriate because:Inpatient level of care appropriate due to severity of illness  Dispo: The patient is from: Home              Anticipated d/c is to: Home              Patient currently is not medically stable to d/c.   Difficult to place patient No       Consultants:  Neurosurgery: Dr. Rosendo Gros 04/21/2020 Oncology: Dr. Lorna Few 04/22/2020 GYN oncology: Dr. Denman George 04/24/2020  Procedures:  CT abdomen pelvis 04/21/2021 Abdominal films 04/22/2021, 04/24/2021, 04/26/2021 Abdominal ultrasound 04/22/2021 Ultrasound-guided left lower quadrant paracentesis--2.1 L of peritoneal fluid removed, per IR, Dr.Mugweru  Right IJ PowerPort placed per IR, Dr. Annamaria Boots 04/26/2021    Antimicrobials: None   Subjective: Patient sitting up in bed complaining of heartburn/reflux and nausea after drinking boost with some associated abdominal discomfort.  No chest pain.  No shortness of breath.  No flatus.  Stated had some liquid stool.    Objective: Vitals:   04/27/21 0528 04/27/21 1417 04/27/21 2047 04/28/21 0538  BP: 124/90 (!) 123/92 (!) 145/90 (!) 139/94  Pulse: 94 94 99 98  Resp: '18 16 18 18  '$ Temp: 98.1 F (36.7 C) 98.1 F (36.7 C) 98.4 F (36.9 C) 98 F (36.7 C)  TempSrc: Oral Oral Oral Oral  SpO2: 92% 94% 96% 94%  Weight:      Height:        Intake/Output Summary (Last 24 hours) at 04/28/2021 1122 Last data filed at 04/27/2021 1518 Gross per 24 hour  Intake 771.2 ml  Output --  Net 771.2 ml    Filed Weights   04/21/21 1704 04/24/21 0546  Weight: 77.6 kg 77 kg    Examination:  General exam: NAD. Respiratory system: Lungs clear to auscultation bilaterally.  No wheezes, no crackles, no rhonchi.  Normal respiratory effort.  Speaking in full sentences.  Cardiovascular system: RRR no murmurs rubs or gallops.  No JVD.  No lower  extremity edema.  Gastrointestinal system: Abdomen is soft, nondistended, positive bowel sounds.  Some tenderness to palpation lower abdominal region.  No rebound.  No guarding. Central nervous system: Alert and oriented.  No focal neurological deficit. Extremities: Symmetric 5 x 5 power. Skin: No rashes, lesions or ulcers Psychiatry: Judgement and insight appear normal. Mood & affect appropriate.  Data Reviewed: I have personally reviewed following labs and imaging studies  CBC: Recent Labs  Lab 04/21/21 1413 04/22/21 0509 04/23/21 0825 04/24/21 0056 04/25/21 2343 04/27/21 0453 04/28/21 0032  WBC 13.0*   < > 9.7 9.3 9.2 9.3 9.4  NEUTROABS 10.1*  --   --   --   --  7.1  --   HGB 12.8   < > 10.9* 11.0* 11.8* 10.9* 10.7*  HCT 38.6   < > 32.1* 32.8* 34.9* 33.0* 32.5*  MCV 92.8   < > 91.5 91.6 90.6 91.4 91.8  PLT 535*   < > 413* 396 360 311 289   < > = values in this interval not displayed.     Basic Metabolic Panel: Recent Labs  Lab 04/21/21 1413 04/22/21 0509 04/23/21 0825 04/24/21 [id number] 04/25/21 2343 04/26/21 0914 04/27/21 0453 04/28/21  0032  NA 132*   < > 135 135 136  --  136 134*  K 3.8   < > 3.7 4.0 3.5  --  3.6 4.0  CL 94*   < > 102 99 100  --  103 100  CO2 23   < > '22 24 26  '$ --  27 24  GLUCOSE 110*   < > 92 105* 104*  --  116* 94  BUN 17   < > '18 16 11  '$ --  10 9  CREATININE 2.57*   < > 1.41* 1.26* 1.07*  --  0.93 0.93  CALCIUM 9.5   < > 9.0 9.1 8.7*  --  8.6* 8.3*  MG 1.8  --   --   --   --  1.5* 2.1 1.7  PHOS  --   --   --   --   --   --  3.3  --    < > = values in this interval not displayed.     GFR: Estimated Creatinine Clearance: 63.2 mL/min (by C-G formula based on SCr of 0.93 mg/dL).  Liver Function Tests: Recent Labs  Lab 04/21/21 1413 04/27/21 0453  AST 12* 14*  ALT 10 8  ALKPHOS 77 49  BILITOT 0.7 0.6  PROT 8.1 6.2*  ALBUMIN 3.5 2.9*     CBG: Recent Labs  Lab 04/28/21 0728  GLUCAP 92     Recent Results (from the past 240  hour(s))  Resp Panel by RT-PCR (Flu A&B, Covid) Nasopharyngeal Swab     Status: None   Collection Time: 04/21/21  6:49 PM   Specimen: Nasopharyngeal Swab; Nasopharyngeal(NP) swabs in vial transport medium  Result Value Ref Range Status   SARS Coronavirus 2 by RT PCR NEGATIVE NEGATIVE Final    Comment: (NOTE) SARS-CoV-2 target nucleic acids are NOT DETECTED.  The SARS-CoV-2 RNA is generally detectable in upper respiratory specimens during the acute phase of infection. The lowest concentration of SARS-CoV-2 viral copies this assay can detect is 138 copies/mL. A negative result does not preclude SARS-Cov-2 infection and should not be used as the sole basis for treatment or other patient management decisions. A negative result may occur with  improper specimen collection/handling, submission of specimen other than nasopharyngeal swab, presence of viral mutation(s) within the areas targeted by this assay, and inadequate number of viral copies(<138 copies/mL). A negative result must be combined with clinical observations, patient history, and epidemiological information. The expected result is Negative.  Fact Sheet for Patients:  EntrepreneurPulse.com.au  Fact Sheet for Healthcare Providers:  IncredibleEmployment.be  This test is no t yet approved or cleared by the [place] FDA and  has been authorized for detection and/or diagnosis of SARS-CoV-2 by FDA under an Emergency Use Authorization (EUA). This EUA will remain  in effect (meaning this test can be used) for the duration of the COVID-19 declaration under Section 564(b)(1) of the Act, 21 U.S.C.section 360bbb-3(b)(1), unless the authorization is terminated  or revoked sooner.       Influenza A by PCR NEGATIVE NEGATIVE Final   Influenza B by PCR NEGATIVE NEGATIVE Final    Comment: (NOTE) The Xpert Xpress SARS-CoV-2/FLU/RSV plus assay is intended as an aid in the diagnosis of influenza from  Nasopharyngeal swab specimens and should not be used as a sole basis for treatment. Nasal washings and aspirates are unacceptable for Xpert Xpress SARS-CoV-2/FLU/RSV testing.  Fact Sheet for Patients: EntrepreneurPulse.com.au  Fact Sheet for Healthcare Providers: IncredibleEmployment.be  This  test is not yet approved or cleared by the [place] and has been authorized for detection and/or diagnosis of SARS-CoV-2 by FDA under an Emergency Use Authorization (EUA). This EUA will remain in effect (meaning this test can be used) for the duration of the COVID-19 declaration under Section 564(b)(1) of the Act, 21 U.S.C. section 360bbb-3(b)(1), unless the authorization is terminated or revoked.  Performed at [facility], [place] [address] [place], [place] [address]   Body fluid culture w Gram Stain     Status: None   Collection Time: 04/24/21  1:55 PM   Specimen: Abdomen; Peritoneal Fluid  Result Value Ref Range Status   Specimen Description PERITONEAL FLUID  Final   Special Requests ABDOMEN  Final   Gram Stain   Final    WBC PRESENT, PREDOMINANTLY MONONUCLEAR NO ORGANISMS SEEN CYTOSPIN SMEAR    Culture   Final    NO GROWTH 3 DAYS Performed at [facility], [place] [address], [place], [place] [address]    Report Status 04/27/2021 FINAL  Final          Radiology Studies: DG Abd 2 Views  Result Date: 04/28/2021 CLINICAL DATA:  Small bowel obstruction. EXAM: ABDOMEN - 2 VIEW COMPARISON:  04/26/2021 and earlier FINDINGS: There is focal atelectasis or. No free intraperitoneal consolidation MEDIAL LEFT LOWER lobe air. Persistent mild dilatation of small bowel loops in the LEFT central abdomen, similar in appearance to previous CT exam. Air is identified within nondilated loops of colon. There are degenerative changes in both hips. IMPRESSION: 1. Persistent mild dilatation of small bowel loops in the LEFT central abdomen. 2. LEFT  LOWER lobe atelectasis or consolidation. Electronically Signed   By: Nolon Nations M.D.   On: 04/28/2021 10:13   IR IMAGING GUIDED PORT INSERTION  Result Date: 04/26/2021 CLINICAL DATA:  Peritoneal carcinomatosis, unknown primary, access for chemotherapy EXAM: RIGHT INTERNAL JUGULAR SINGLE LUMEN POWER PORT CATHETER INSERTION Date:  04/26/2021 04/26/2021 2:15 pm Radiologist:  M. Daryll Brod, MD Guidance:  Ultrasound and fluoroscopic MEDICATIONS: 1% lidocaine with epinephrine local ANESTHESIA/SEDATION: Versed 2.0 mg IV; Fentanyl 100 mcg IV; Moderate Sedation Time:  25 minutes The patient was continuously monitored during the procedure by the interventional radiology nurse under my direct supervision. FLUOROSCOPY TIME:  One minutes, 0 seconds (5 mGy) COMPLICATIONS: None immediate. CONTRAST:  None. PROCEDURE: Informed consent was obtained from the patient following explanation of the procedure, risks, benefits and alternatives. The patient understands, agrees and consents for the procedure. All questions were addressed. A time out was performed. Maximal barrier sterile technique utilized including caps, mask, sterile gowns, sterile gloves, large sterile drape, hand hygiene, and 2% chlorhexidine scrub. Under sterile conditions and local anesthesia, right internal jugular micropuncture venous access was performed. Access was performed with ultrasound. Images were obtained for documentation of the patent right internal jugular vein. A guide wire was inserted followed by a transitional dilator. This allowed insertion of a guide wire and catheter into the IVC. Measurements were obtained from the SVC / RA junction back to the right IJ venotomy site. In the right infraclavicular chest, a subcutaneous pocket was created over the second anterior rib. This was done under sterile conditions and local anesthesia. 1% lidocaine with epinephrine was utilized for this. A 2.5 cm incision was made in the skin. Blunt dissection was  performed to create a subcutaneous pocket over the right pectoralis major muscle. The pocket was flushed with saline vigorously. There was adequate hemostasis. The port  catheter was assembled and checked for leakage. The port catheter was secured in the pocket with two retention sutures. The tubing was tunneled subcutaneously to the right venotomy site and inserted into the SVC/RA junction through a valved peel-away sheath. Position was confirmed with fluoroscopy. Images were obtained for documentation. The patient tolerated the procedure well. No immediate complications. Incisions were closed in a two layer fashion with 4 - 0 Vicryl suture. Dermabond was applied to the skin. The port catheter was accessed, blood was aspirated followed by saline and heparin flushes. Needle was removed. A dry sterile dressing was applied. IMPRESSION: Ultrasound and fluoroscopically guided right internal jugular single lumen power port catheter insertion. Tip in the SVC/RA junction. Catheter ready for use. Electronically Signed   By: Jerilynn Mages.  Shick M.D.   On: 04/26/2021 14:22        Scheduled Meds:  (feeding supplement) PROSource Plus  30 mL Oral TID BM   Chlorhexidine Gluconate Cloth  6 each Topical Daily   enoxaparin (LOVENOX) injection  40 mg Subcutaneous Q24H   feeding supplement  1 Container Oral TID BM   multivitamin with minerals  1 tablet Oral Daily   Continuous Infusions:  folic acid (FOLVITE) IVPB     lactated ringers 50 mL/hr at 04/28/21 0914     LOS: 7 days    Time spent: 35 minutes    Irine Seal, MD [facility]   To contact the attending provider between 7A-7P or the covering provider during after hours 7P-7A, please log into the web site [email or web address] and access using universal [place] password for that web site. If you do not have the password, please call the hospital operator.  04/28/2021, 11:22 AM

## 2021-04-29 ENCOUNTER — Inpatient Hospital Stay (HOSPITAL_COMMUNITY): Payer: Medicare Other

## 2021-04-29 ENCOUNTER — Encounter (HOSPITAL_COMMUNITY): Payer: Self-pay | Admitting: Family Medicine

## 2021-04-29 DIAGNOSIS — K56609 Unspecified intestinal obstruction, unspecified as to partial versus complete obstruction: Secondary | ICD-10-CM | POA: Diagnosis not present

## 2021-04-29 DIAGNOSIS — E871 Hypo-osmolality and hyponatremia: Secondary | ICD-10-CM | POA: Diagnosis not present

## 2021-04-29 DIAGNOSIS — N179 Acute kidney failure, unspecified: Secondary | ICD-10-CM | POA: Diagnosis not present

## 2021-04-29 DIAGNOSIS — R180 Malignant ascites: Secondary | ICD-10-CM | POA: Diagnosis not present

## 2021-04-29 LAB — CBC
HCT: 33.4 % — ABNORMAL LOW (ref 36.0–46.0)
Hemoglobin: 11 g/dL — ABNORMAL LOW (ref 12.0–15.0)
MCH: 30.2 pg (ref 26.0–34.0)
MCHC: 32.9 g/dL (ref 30.0–36.0)
MCV: 91.8 fL (ref 80.0–100.0)
Platelets: 281 10*3/uL (ref 150–400)
RBC: 3.64 MIL/uL — ABNORMAL LOW (ref 3.87–5.11)
RDW: 13.2 % (ref 11.5–15.5)
WBC: 11.8 10*3/uL — ABNORMAL HIGH (ref 4.0–10.5)
nRBC: 0 % (ref 0.0–0.2)

## 2021-04-29 LAB — BASIC METABOLIC PANEL
Anion gap: 9 (ref 5–15)
BUN: 9 mg/dL (ref 8–23)
CO2: 25 mmol/L (ref 22–32)
Calcium: 8.3 mg/dL — ABNORMAL LOW (ref 8.9–10.3)
Chloride: 100 mmol/L (ref 98–111)
Creatinine, Ser: 0.88 mg/dL (ref 0.44–1.00)
GFR, Estimated: >60 mL/min (ref >60)
Glucose, Bld: 108 mg/dL — ABNORMAL HIGH (ref 70–99)
Potassium: 3.8 mmol/L (ref 3.5–5.1)
Sodium: 134 mmol/L — ABNORMAL LOW (ref 135–145)

## 2021-04-29 LAB — MAGNESIUM: Magnesium: 2.2 mg/dL (ref 1.7–2.4)

## 2021-04-29 MED ORDER — IOHEXOL 350 MG/ML SOLN
80.0000 mL | Freq: Once | INTRAVENOUS | Status: AC | PRN
  Administered 2021-04-29: 80 mL via INTRAVENOUS

## 2021-04-29 MED ORDER — DEXTROSE-NACL 5-0.9 % IV SOLN
INTRAVENOUS | Status: DC
  Administered 2021-04-29: 1000 mL via INTRAVENOUS

## 2021-04-29 NOTE — Progress Notes (Signed)
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 Berline Lopes and she will try to arrange diagnostic laparoscopy and biopsy next week She had port placement In the meantime, I recommend continue supportive care Chemo is placed on hold until further results are available   Malignant ascites She will likely have reaccumulation of ascites with aggressive IV fluid hydration Monitor closely I reduce IV fluids a little bit   Uncontrolled nausea and vomiting, stable Due to abdominal carcinomatosis This is not likely going to get better without treatment Continue antiemetics as needed For now, she does not need NG tube  Malnutrition due to n.p.o. status Will start TPN after surgery  Acute renal failure, resolved Due to dehydration and carcinomatosis It is improving Recommend gentle fluid hydration only   Constipation, resolved Continue conservative approach I am concerned about risk of bowel perforation with laxative Monitor closely   Code Status Full code   Goals of care Resolution of bowel obstruction, ability to tolerate oral intake and resolution of nausea   Discharge planning I anticipate she will be in the hospital 5 to 7 days All questions were answered. The patient knows to call the clinic with any problems, questions or concerns.   The total time spent in the appointment was 25 minutes encounter with patients including review  of chart and various tests results, discussions about plan of care and coordination of care plan  Brenda Lark, MD 04/29/2021 10:44 AM  Subjective:  She feels well.  Denies pain.  I have coordinated care with primary service and interventional radiologist about obtaining tissue  Objective:  Vitals:   04/28/21 2122 04/29/21 0455  BP: (!) 137/102 (!) 144/99  Pulse: (!) 110 (!) 108  Resp: 18 18  Temp: 97.8 F (36.6 C) 98.1 F (36.7 C)  SpO2: 94% 96%     Intake/Output Summary (Last 24 hours) at 04/29/2021 1044 Last data filed at 04/28/2021 1530 Gross per 24 hour  Intake 169.05 ml  Output --  Net 169.05 ml    GENERAL:alert, no distress and comfortable  NEURO: alert & oriented x 3 with fluent speech, no focal motor/sensory deficits   Labs:  Recent Labs    05/20/20 1032 04/21/21 1413 04/22/21 0509 04/27/21 0453 04/28/21 0032 04/29/21 0500  NA 142 132*   < > 136 134* 134*  K 3.5 3.8   < > 3.6 4.0 3.8  CL 104 94*   < > 103 100 100  CO2 28 23   < > '27 24 25  '$ GLUCOSE 113* 110*   < > 116* 94 108*  BUN 10 17   < > '10 9 9  '$ CREATININE 1.03* 2.57*   < > 0.93 0.93 0.88  CALCIUM 9.4 9.5   < > 8.6* 8.3* 8.3*  GFRNONAA 56* 20*   < > >60 >60 >60  GFRAA >60  --   --   --   --   --   PROT 7.9 8.1  --  6.2*  --   --   ALBUMIN 4.1 3.5  --  2.9*  --   --   AST 20 12*  --  14*  --   --   ALT 14 10  --  8  --   --   ALKPHOS 73 77  --  49  --   --   BILITOT 0.6 0.7  --  0.6  --   --    < > = values in this interval not displayed.    Studies:  CT ABDOMEN PELVIS WO CONTRAST  Result Date: 04/21/2021 CLINICAL DATA:  Nausea and vomiting for 2 days. Lower abdominal pain for approximately 3 months. Chronic constipation. EXAM: CT ABDOMEN AND PELVIS WITHOUT CONTRAST TECHNIQUE: Multidetector CT imaging of the abdomen and pelvis was performed following the standard protocol without IV contrast. COMPARISON:  None. FINDINGS: Lower chest: No acute findings. Hepatobiliary: No mass visualized on this  unenhanced exam. Gallbladder is unremarkable. No evidence of biliary ductal dilatation. Pancreas: No mass or inflammatory process visualized on this unenhanced exam. Spleen:  Within normal limits in size. Adrenals/Urinary tract: No evidence of urolithiasis or hydronephrosis. Unremarkable unopacified urinary bladder. Stomach/Bowel: Small hiatal hernia is seen containing ascites. Moderate diffuse small bowel dilatation is seen, with suspected transition point in the right lower quadrant, suspicious for distal small bowel obstruction. Vascular/Lymphatic: No pathologically enlarged lymph nodes identified. No evidence of abdominal aortic aneurysm. Aortic atherosclerotic calcification noted. Reproductive: Prior hysterectomy suspected. Moderate ascites is seen. Omental caking and diffuse mesenteric soft tissue stranding is seen. There is also probable peritoneal nodularity along the left pelvic sidewall. These findings are highly suspicious for peritoneal carcinomatosis. Other:  None. Musculoskeletal:  No suspicious bone lesions identified. 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 Suspected distal small bowel obstruction, with probable transition point in right lower quadrant. Aortic Atherosclerosis (ICD10-I70.0). Electronically Signed   By: Marlaine Hind M.D.   On: 04/21/2021 18:36   US Abdomen Limited  Result Date: 04/22/2021 CLINICAL DATA:  Peritoneal neoplasm abdominal distension, recurrent ascites EXAM: LIMITED ABDOMEN ULTRASOUND FOR ASCITES TECHNIQUE: Limited ultrasound survey for ascites was performed in all four abdominal quadrants. COMPARISON:  04/21/2021 CT FINDINGS: Survey of the abdominal 4 quadrants demonstrates a small volume of lower abdominal ascites by ultrasound. Unable to perform paracentesis because of intractable nausea vomiting today. IMPRESSION: Small volume of lower abdominopelvic ascites. Will reassess for paracentesis tomorrow 04/23/2021. Electronically  Signed   By: Jerilynn Mages.  Shick M.D.   On: 04/22/2021 12:09   DG Abd 2 Views  Result Date: 04/28/2021 CLINICAL DATA:  Small bowel obstruction. EXAM: ABDOMEN - 2 VIEW COMPARISON:  04/26/2021 and earlier FINDINGS: There is focal atelectasis or. No free intraperitoneal consolidation MEDIAL LEFT LOWER lobe air. Persistent mild dilatation of small bowel loops in the LEFT central abdomen, similar in appearance to previous CT exam. Air is identified within nondilated loops of colon. There are degenerative changes in both hips. IMPRESSION: 1. Persistent mild dilatation of small bowel loops in the LEFT central abdomen. 2. LEFT LOWER lobe atelectasis or consolidation. Electronically Signed   By: Nolon Nations M.D.   On: 04/28/2021 10:13   DG Abd 2 Views  Result Date: 04/26/2021 CLINICAL DATA:  Follow-up small bowel obstruction. EXAM: ABDOMEN - 2 VIEW COMPARISON:  A2 22 FINDINGS: There is mild residual gaseous distension of multiple small bowel loops, decreased from prior. A small amount of gas is scattered in the colon and rectum. No intraperitoneal free air is identified. Atelectasis  is noted in the left lung base. IMPRESSION: Decreased small bowel distension compatible with improving obstruction. Electronically Signed   By: Logan Bores M.D.   On: 04/26/2021 12:45   DG Abd Portable 1V  Result Date: 04/24/2021 CLINICAL DATA:  Small-bowel obstruction. EXAM: PORTABLE ABDOMEN - 1 VIEW COMPARISON:  04/22/2021.  CT 04/21/2021. FINDINGS: Mild-to-moderately distended loops of small bowel with a relative paucity of colonic gas again noted. Findings again consistent with small bowel obstruction. No free air. Ascites most likely present. Pelvic calcifications consistent phleboliths. Degenerative changes lumbar spine and both hips. IMPRESSION: Mild-to-moderate small-bowel distention with a paucity of intra colonic air again noted. Findings again consistent with small bowel obstruction. Ascites most likely present. Electronically  Signed   By: Marcello Moores  Register   On: 04/24/2021 11:08   DG Abd Portable 1V-Small Bowel Obstruction Protocol-initial, 8 hr delay  Result Date: 04/22/2021 CLINICAL DATA:  Small bowel obstruction, 8 hour film. EXAM: PORTABLE ABDOMEN - 1 VIEW COMPARISON:  CT yesterday. FINDINGS: Administered enteric contrast has reached the ascending and proximal transverse colon. There is enteric contrast within prominent small bowel in the central abdomen. Left upper quadrant contrast may be residual within small bowel or at the splenic flexure. No evidence of free air. IMPRESSION: Administered enteric contrast has reached the ascending and proximal transverse colon. There is enteric contrast within prominent small bowel in the central abdomen. Left upper quadrant contrast may be residual within small bowel or at the splenic flexure. Electronically Signed   By: Keith Rake M.D.   On: 04/22/2021 17:38   IR IMAGING GUIDED PORT INSERTION  Result Date: 04/26/2021 CLINICAL DATA:  Peritoneal carcinomatosis, unknown primary, access for chemotherapy EXAM: RIGHT INTERNAL JUGULAR SINGLE LUMEN POWER PORT CATHETER INSERTION Date:  04/26/2021 04/26/2021 2:15 pm Radiologist:  M. Daryll Brod, MD Guidance:  Ultrasound and fluoroscopic MEDICATIONS: 1% lidocaine with epinephrine local ANESTHESIA/SEDATION: Versed 2.0 mg IV; Fentanyl 100 mcg IV; Moderate Sedation Time:  25 minutes The patient was continuously monitored during the procedure by the interventional radiology nurse under my direct supervision. FLUOROSCOPY TIME:  One minutes, 0 seconds (5 mGy) COMPLICATIONS: None immediate. CONTRAST:  None. PROCEDURE: Informed consent was obtained from the patient following explanation of the procedure, risks, benefits and alternatives. The patient understands, agrees and consents for the procedure. All questions were addressed. A time out was performed. Maximal barrier sterile technique utilized including caps, mask, sterile gowns, sterile gloves,  large sterile drape, hand hygiene, and 2% chlorhexidine scrub. Under sterile conditions and local anesthesia, right internal jugular micropuncture venous access was performed. Access was performed with ultrasound. Images were obtained for documentation of the patent right internal jugular vein. A guide wire was inserted followed by a transitional dilator. This allowed insertion of a guide wire and catheter into the IVC. Measurements were obtained from the SVC / RA junction back to the right IJ venotomy site. In the right infraclavicular chest, a subcutaneous pocket was created over the second anterior rib. This was done under sterile conditions and local anesthesia. 1% lidocaine with epinephrine was utilized for this. A 2.5 cm incision was made in the skin. Blunt dissection was performed to create a subcutaneous pocket over the right pectoralis major muscle. The pocket was flushed with saline vigorously. There was adequate hemostasis. The port catheter was assembled and checked for leakage. The port catheter was secured in the pocket with two retention sutures. The tubing was tunneled subcutaneously to the right venotomy site and inserted into the SVC/RA junction through  a valved peel-away sheath. Position was confirmed with fluoroscopy. Images were obtained for documentation. The patient tolerated the procedure well. No immediate complications. Incisions were closed in a two layer fashion with 4 - 0 Vicryl suture. Dermabond was applied to the skin. The port catheter was accessed, blood was aspirated followed by saline and heparin flushes. Needle was removed. A dry sterile dressing was applied. IMPRESSION: Ultrasound and fluoroscopically guided right internal jugular single lumen power port catheter insertion. Tip in the SVC/RA junction. Catheter ready for use. Electronically Signed   By: Jerilynn Mages.  Shick M.D.   On: 04/26/2021 14:22   IR Paracentesis  Result Date: 04/24/2021 INDICATION: Abdominal distention secondary to  ascites. Underlying concern for peritoneal carcinomatosis on imaging findings. Request for diagnostic and therapeutic paracentesis. EXAM: ULTRASOUND GUIDED LEFT LOWER QUADRANT PARACENTESIS MEDICATIONS: 1% plain lidocaine, 5 mL COMPLICATIONS: None immediate. PROCEDURE: Informed written consent was obtained from the patient after a discussion of the risks, benefits and alternatives to treatment. A timeout was performed prior to the initiation of the procedure. Initial ultrasound scanning demonstrates a large amount of ascites within the left lower abdominal quadrant. The left lower abdomen was prepped and draped in the usual sterile fashion. 1% lidocaine was used for local anesthesia. Following this, a 19 gauge, 7-cm, Yueh catheter was introduced. An ultrasound image was saved for documentation purposes. The paracentesis was performed. The catheter was removed and a dressing was applied. The patient tolerated the procedure well without immediate post procedural complication. FINDINGS: A total of approximately 2.1 L of clear yellow fluid was removed. Samples were sent to the laboratory as requested by the clinical team. IMPRESSION: Successful ultrasound-guided paracentesis yielding 2.1 liters of peritoneal fluid. Read by: Ascencion Dike PA-C Electronically Signed   By: Michaelle Birks MD   On: 04/24/2021 12:38

## 2021-04-29 NOTE — Progress Notes (Signed)
Ambulated in the hall x3, tolerated well.

## 2021-04-29 NOTE — Progress Notes (Signed)
IR was requested for image guided bx of omental mass.   Case was discussed with Dr. Serafina Royals, who recommends CT abodmen/pelvis with contrast for further evaluation of biopsy target. CT AP w/ reviewed by Dr. Serafina Royals, no omental masses visualized, no target for percutaneous bx.    Ordering provider notified via secure chat.  Will delete the biopsy order.   Please call IR for questions and concerns.   Armando Gang Stephanieann Popescu PA-C 04/29/2021 11:45 AM

## 2021-04-29 NOTE — Progress Notes (Signed)
PROGRESS NOTE    Brenda Stewart  [id number] DOB: 1950-12-12 DOA: 04/21/2021 PCP: Pcp, No    No chief complaint on file.   Brief Narrative:  Brenda Stewart is a 70 y/o F with HTN, GERD who presented with abdominal pain and was found to have SBO due to peritoneal carcinomatosis.  General surgery and oncology consulted and following.  Patient transferred to [facility] hospital to start chemotherapy.   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 -Biopsies pending. -Cytology pending. -Patient underwent trial of clear liquids x2 with nausea and emesis and currently n.p.o. except ice chips. -Abdominal films done 04/26/2021 with decreased small bowel distention compatible with improving obstruction. -Patient with no flatus.  -Patient stated had soft mushy stool this morning.    -Patient stated had multiple bouts of emesis and nausea the evening of 04/26/2021 after being placed on a diet after port placement.  -Patient stated NG tube placement was attempted at [facility] x3 per RN which was unsuccessful and patient very hesitant/resistant for NG tube placement if needed at this time. -Patient with improvement of heartburn with GI cocktail. -Discontinued oral nutritional supplementation due to SBO.   -GI cocktail every 6 hours as needed -Continue IV PPI daily -Continue IV antiemetics, gentle hydration, supportive care.  -CT-guided biopsy of omental caking for tissue biopsy per IR has been ordered per oncology. -General surgery was following but  have signed off as of 04/27/2021.  -Per oncology.    2.  Peritoneal carcinomatosis/elevated CA125/concern for ovarian or fallopian tube cancer with biopsies pending -Pathology report pending per oncology note. -Status post paracentesis with cytology pending. -Patient status post port placement 04/26/2021. -Oncology following and hoping to start patient on chemotherapy with carboplatin and paclitaxel however awaiting biopsies. -Per oncology, not enough cells available for analysis and as such CT-guided biopsy of omental caking for tissue biopsy by IR has been ordered per oncology. -IR requesting CT abdomen and pelvis for evaluation of biopsy target which has been ordered and done this morning. -Per oncology.  3.  Malignant ascites -Status post paracentesis. -Concern for reaccumulation of ascites. -Cytology pending. -IV fluids back down to 50 cc an hour.   -Per oncology.   4.  Acute renal failure -Secondary to prerenal azotemia.   -Resolved with hydration.   -Change IV fluids to D5 normal saline at 50 cc an hour.    5.  Prolonged QT interval -Repeat EKG with resolution of QT prolongation. -Keep potassium close to 4, magnesium close to 2. -Follow.  6.  Severe protein calorie malnutrition/unintended weight loss -Likely secondary to malignancy/#2. -Patient currently n.p.o. secondary to problem #1. -If no significant improvement may need to be started on TNA and may require a separate PICC line as patient to receive chemotherapy through port. -We will defer initiation of TNA to oncology.  7.  Thrombocytosis -Resolved.  8.  Hypomagnesemia -Repleted, magnesium at 2.2.  9.  Hyponatremia -Improved with hydration.  10.  Dehydration -Change IV fluids to D5NS at 50 cc an hour.  11.  Iron  deficiency anemia/folate deficiency -Anemia panel consistent with iron deficiency anemia and folate deficiency. -Folate level high at 5.0, iron at 18, ferritin on 415.  Vitamin B12 at [id number]. -Continue  folic acid 1 mg IV daily as patient NPO.   -May benefit from dose of IV Feraheme during this hospitalization however will defer to hematology/oncology.  -Hemoglobin stable at 11.  -Transfusion threshold hemoglobin < 7.     DVT prophylaxis: Heparin>>>> Lovenox Code Status: Full Family Communication: Updated patient.  No family at bedside. Disposition:   Status is: Inpatient  Remains inpatient appropriate because:Inpatient level of care appropriate due to severity of illness  Dispo: The patient is from: Home              Anticipated d/c is to: Home              Patient currently is not medically stable to d/c.   Difficult to place patient No       Consultants:  Neurosurgery: Dr. Rosendo Gros 04/21/2020 Oncology: Dr. Lorna Few 04/22/2020 GYN oncology: Dr. Denman George 04/24/2020  Procedures:  CT abdomen pelvis 04/21/2021 Abdominal films 04/22/2021, 04/24/2021, 04/26/2021 Abdominal ultrasound 04/22/2021 Ultrasound-guided left lower quadrant paracentesis--2.1 L of peritoneal fluid removed, per IR, Dr.Mugweru  Right IJ PowerPort placed per IR, Dr. Annamaria Boots 04/26/2021 CT abdomen and pelvis 04/29/2021    Antimicrobials: None   Subjective: Patient laying in bed.  Denies any further nausea or emesis since yesterday.  No chest pain.  No shortness of breath.  No significant abdominal pain.  Denies any flatus.  Stated had a soft mushy bowel movement this morning.  Stated just returning from CT abdomen and pelvis.  Heartburn improved with GI cocktail.     Objective: Vitals:   04/28/21 1353 04/28/21 1354 04/28/21 2122 04/29/21 0455  BP: (!) 144/96 (!) 144/96 (!) 137/102 (!) 144/99  Pulse: 98 99 (!) 110 (!) 108  Resp: '17 17 18 18  '$ Temp: 98.2 F (36.8 C) 98.2 F (36.8 C) 97.8 F (36.6 C) 98.1 F (36.7 C)  TempSrc: Oral Oral Oral Oral  SpO2:  96% 94% 96%  Weight:      Height:        Intake/Output Summary (Last 24 hours) at 04/29/2021 1040 Last data filed at 04/28/2021 1530 Gross per 24 hour   Intake 169.05 ml  Output --  Net 169.05 ml    Filed Weights   04/21/21 1704 04/24/21 0546  Weight: 77.6 kg 77 kg    Examination: General exam: : NAD Respiratory system: CTA B.  No wheezes, no rhonchi.  Speaking in full sentences.  Normal respiratory effort. Cardiovascular system: Regular rate and rhythm no murmurs rubs or gallops.  No JVD.  No lower extremity edema.  Gastrointestinal system: Abdomen soft, nontender, nondistended, positive bowel sounds.  No rebound.  No guarding. Central nervous system: Alert and oriented. No focal neurological deficits. Extremities: Symmetric 5 x 5 power. Skin: No rashes, lesions or ulcers Psychiatry: Judgement and insight appear normal. Mood & affect appropriate.  Data Reviewed: I have personally reviewed following labs and imaging studies  CBC: Recent Labs  Lab 04/24/21 0056 04/25/21 2343 04/27/21 0453 04/28/21 0032 04/29/21 0500  WBC 9.3 9.2 9.3 9.4 11.8*  NEUTROABS  --   --  7.1  --   --   HGB 11.0* 11.8* 10.9* 10.7* 11.0*  HCT 32.8* 34.9* 33.0* 32.5* 33.4*  MCV 91.6 90.6 91.4 91.8 91.8  PLT 396 360 311 289 281     Basic Metabolic Panel:  Recent Labs  Lab 04/24/21 0056 04/25/21 2343 04/26/21 0914 04/27/21 0453 04/28/21 0032 04/29/21 0500  NA 135 136  --  136 134* 134*  K 4.0 3.5  --  3.6 4.0 3.8  CL 99 100  --  103 100 100  CO2 24 26  --  '27 24 25  '$ GLUCOSE 105* 104*  --  116* 94 108*  BUN 16 11  --  '10 9 9  '$ CREATININE 1.26* 1.07*  --  0.93 0.93 0.88  CALCIUM 9.1 8.7*  --  8.6* 8.3* 8.3*  MG  --   --  1.5* 2.1 1.7 2.2  PHOS  --   --   --  3.3  --   --      GFR: Estimated Creatinine Clearance: 66.7 mL/min (by C-G formula based on SCr of 0.88 mg/dL).  Liver Function Tests: Recent Labs  Lab 04/27/21 0453  AST 14*  ALT 8  ALKPHOS 49  BILITOT 0.6  PROT 6.2*  ALBUMIN 2.9*     CBG: Recent Labs  Lab 04/28/21 0728  GLUCAP 92      Recent Results (from the past 240 hour(s))  Resp Panel by RT-PCR (Flu  A&B, Covid) Nasopharyngeal Swab     Status: None   Collection Time: 04/21/21  6:49 PM   Specimen: Nasopharyngeal Swab; Nasopharyngeal(NP) swabs in vial transport medium  Result Value Ref Range Status   SARS Coronavirus 2 by RT PCR NEGATIVE NEGATIVE Final    Comment: (NOTE) SARS-CoV-2 target nucleic acids are NOT DETECTED.  The SARS-CoV-2 RNA is generally detectable in upper respiratory specimens during the acute phase of infection. The lowest concentration of SARS-CoV-2 viral copies this assay can detect is 138 copies/mL. A negative result does not preclude SARS-Cov-2 infection and should not be used as the sole basis for treatment or other patient management decisions. A negative result may occur with  improper specimen collection/handling, submission of specimen other than nasopharyngeal swab, presence of viral mutation(s) within the areas targeted by this assay, and inadequate number of viral copies(<138 copies/mL). A negative result must be combined with clinical observations, patient history, and epidemiological information. The expected result is Negative.  Fact Sheet for Patients:  EntrepreneurPulse.com.au  Fact Sheet for Healthcare Providers:  IncredibleEmployment.be  This test is no t yet approved or cleared by the [place] FDA and  has been authorized for detection and/or diagnosis of SARS-CoV-2 by FDA under an Emergency Use Authorization (EUA). This EUA will remain  in effect (meaning this test can be used) for the duration of the COVID-19 declaration under Section 564(b)(1) of the Act, 21 U.S.C.section 360bbb-3(b)(1), unless the authorization is terminated  or revoked sooner.       Influenza A by PCR NEGATIVE NEGATIVE Final   Influenza B by PCR NEGATIVE NEGATIVE Final    Comment: (NOTE) The Xpert Xpress SARS-CoV-2/FLU/RSV plus assay is intended as an aid in the diagnosis of influenza from Nasopharyngeal swab specimens  and should not be used as a sole basis for treatment. Nasal washings and aspirates are unacceptable for Xpert Xpress SARS-CoV-2/FLU/RSV testing.  Fact Sheet for Patients: EntrepreneurPulse.com.au  Fact Sheet for Healthcare Providers: IncredibleEmployment.be  This test is not yet approved or cleared by the [place] FDA and has been authorized for detection and/or diagnosis of SARS-CoV-2 by FDA under an Emergency Use Authorization (EUA). This EUA will remain in effect (meaning this test can be used) for the duration of the COVID-19 declaration under Section 564(b)(1) of the Act,  21 U.S.C. section 360bbb-3(b)(1), unless the authorization is terminated or revoked.  Performed at [facility], [place] [address] [place], [place] [address]   Body fluid culture w Gram Stain     Status: None   Collection Time: 04/24/21  1:55 PM   Specimen: Abdomen; Peritoneal Fluid  Result Value Ref Range Status   Specimen Description PERITONEAL FLUID  Final   Special Requests ABDOMEN  Final   Gram Stain   Final    WBC PRESENT, PREDOMINANTLY MONONUCLEAR NO ORGANISMS SEEN CYTOSPIN SMEAR    Culture   Final    NO GROWTH 3 DAYS Performed at [facility], [place] [address], [place], [place] [address]    Report Status 04/27/2021 FINAL  Final          Radiology Studies: DG Abd 2 Views  Result Date: 04/28/2021 CLINICAL DATA:  Small bowel obstruction. EXAM: ABDOMEN - 2 VIEW COMPARISON:  04/26/2021 and earlier FINDINGS: There is focal atelectasis or. No free intraperitoneal consolidation MEDIAL LEFT LOWER lobe air. Persistent mild dilatation of small bowel loops in the LEFT central abdomen, similar in appearance to previous CT exam. Air is identified within nondilated loops of colon. There are degenerative changes in both hips. IMPRESSION: 1. Persistent mild dilatation of small bowel loops in the LEFT central abdomen. 2. LEFT LOWER lobe atelectasis or  consolidation. Electronically Signed   By: Nolon Nations M.D.   On: 04/28/2021 10:13        Scheduled Meds:  Chlorhexidine Gluconate Cloth  6 each Topical Daily   enoxaparin (LOVENOX) injection  40 mg Subcutaneous Q24H   multivitamin with minerals  1 tablet Oral Daily   pantoprazole (PROTONIX) IV  40 mg Intravenous Daily   Continuous Infusions:  dextrose 5 % and [id number]     folic acid (FOLVITE) IVPB 1 mg (04/29/21 1036)     LOS: 8 days    Time spent: 35 minutes    Irine Seal, MD [facility]   To contact the attending provider between 7A-7P or the covering provider during after hours 7P-7A, please log into the web site [email or web address] and access using universal [place] password for that web site. If you do not have the password, please call the hospital operator.  04/29/2021, 10:40 AM

## 2021-04-30 ENCOUNTER — Inpatient Hospital Stay (HOSPITAL_COMMUNITY): Payer: Medicare Other | Admitting: Anesthesiology

## 2021-04-30 ENCOUNTER — Encounter (HOSPITAL_COMMUNITY)
Admission: EM | Disposition: A | Discharge status: Home Health | Payer: Self-pay | Source: Home / Self Care | Attending: Internal Medicine

## 2021-04-30 ENCOUNTER — Other Ambulatory Visit: Payer: Self-pay

## 2021-04-30 ENCOUNTER — Encounter (HOSPITAL_COMMUNITY): Payer: Self-pay | Admitting: Family Medicine

## 2021-04-30 DIAGNOSIS — K56609 Unspecified intestinal obstruction, unspecified as to partial versus complete obstruction: Secondary | ICD-10-CM | POA: Diagnosis not present

## 2021-04-30 DIAGNOSIS — C800 Disseminated malignant neoplasm, unspecified: Secondary | ICD-10-CM | POA: Diagnosis present

## 2021-04-30 HISTORY — PX: LAPAROSCOPY: SHX197

## 2021-04-30 LAB — ABO/RH: ABO/RH(D): O POS

## 2021-04-30 LAB — BASIC METABOLIC PANEL
Anion gap: 9 (ref 5–15)
BUN: 7 mg/dL — ABNORMAL LOW (ref 8–23)
CO2: 25 mmol/L (ref 22–32)
Calcium: 8.2 mg/dL — ABNORMAL LOW (ref 8.9–10.3)
Chloride: 102 mmol/L (ref 98–111)
Creatinine, Ser: 0.92 mg/dL (ref 0.44–1.00)
GFR, Estimated: >60 mL/min (ref >60)
Glucose, Bld: 116 mg/dL — ABNORMAL HIGH (ref 70–99)
Potassium: 3.5 mmol/L (ref 3.5–5.1)
Sodium: 136 mmol/L (ref 135–145)

## 2021-04-30 LAB — CBC
HCT: 31.1 % — ABNORMAL LOW (ref 36.0–46.0)
Hemoglobin: 10.1 g/dL — ABNORMAL LOW (ref 12.0–15.0)
MCH: 30.1 pg (ref 26.0–34.0)
MCHC: 32.5 g/dL (ref 30.0–36.0)
MCV: 92.8 fL (ref 80.0–100.0)
Platelets: 267 10*3/uL (ref 150–400)
RBC: 3.35 MIL/uL — ABNORMAL LOW (ref 3.87–5.11)
RDW: 13.2 % (ref 11.5–15.5)
WBC: 8.7 10*3/uL (ref 4.0–10.5)
nRBC: 0 % (ref 0.0–0.2)

## 2021-04-30 LAB — TYPE AND SCREEN
ABO/RH(D): O POS
Antibody Screen: NEGATIVE

## 2021-04-30 SURGERY — LAPAROSCOPY, DIAGNOSTIC
Anesthesia: General

## 2021-04-30 MED ORDER — BUPIVACAINE HCL 0.25 % IJ SOLN
INTRAMUSCULAR | Status: AC
  Filled 2021-04-30: qty 1

## 2021-04-30 MED ORDER — PROPOFOL 10 MG/ML IV BOLUS
INTRAVENOUS | Status: DC | PRN
Start: 2021-04-30 — End: 2021-04-30
  Administered 2021-04-30: 130 mg via INTRAVENOUS

## 2021-04-30 MED ORDER — BUPIVACAINE HCL (PF) 0.25 % IJ SOLN
INTRAMUSCULAR | Status: DC | PRN
  Administered 2021-04-30: 21 mL

## 2021-04-30 MED ORDER — ACETAMINOPHEN 500 MG PO TABS: 1000.0000 mg | ORAL_TABLET | Freq: Once | ORAL | Status: DC

## 2021-04-30 MED ORDER — FENTANYL CITRATE (PF) 100 MCG/2ML IJ SOLN
INTRAMUSCULAR | Status: DC | PRN
  Administered 2021-04-30 (×2): 50 ug via INTRAVENOUS

## 2021-04-30 MED ORDER — LACTATED RINGERS IR SOLN
Status: DC | PRN
  Administered 2021-04-30: 1000 mL

## 2021-04-30 MED ORDER — PROPOFOL 10 MG/ML IV BOLUS
INTRAVENOUS | Status: AC
  Filled 2021-04-30: qty 20

## 2021-04-30 MED ORDER — ENOXAPARIN SODIUM 40 MG/0.4ML IJ SOSY
40.0000 mg | PREFILLED_SYRINGE | INTRAMUSCULAR | Status: DC
  Administered 2021-05-01 – 2021-05-04 (×4): 40 mg via SUBCUTANEOUS
  Filled 2021-04-30 (×4): qty 0.4

## 2021-04-30 MED ORDER — ROCURONIUM BROMIDE 10 MG/ML (PF) SYRINGE
PREFILLED_SYRINGE | INTRAVENOUS | Status: DC | PRN
  Administered 2021-04-30: 50 mg via INTRAVENOUS
  Administered 2021-04-30: 10 mg via INTRAVENOUS

## 2021-04-30 MED ORDER — SODIUM CHLORIDE 0.9% FLUSH
10.0000 mL | Freq: Two times a day (BID) | INTRAVENOUS | Status: DC
  Administered 2021-04-30 – 2021-05-08 (×7): 10 mL

## 2021-04-30 MED ORDER — FENTANYL CITRATE (PF) 100 MCG/2ML IJ SOLN
INTRAMUSCULAR | Status: AC
  Filled 2021-04-30: qty 2

## 2021-04-30 MED ORDER — PROPOFOL 1000 MG/100ML IV EMUL
INTRAVENOUS | Status: AC
  Filled 2021-04-30: qty 100

## 2021-04-30 MED ORDER — ALUM & MAG HYDROXIDE-SIMETH 200-200-20 MG/5ML PO SUSP
30.0000 mL | ORAL | Status: DC | PRN
  Administered 2021-04-30 – 2021-05-06 (×2): 30 mL via ORAL
  Filled 2021-04-30 (×2): qty 30

## 2021-04-30 MED ORDER — DEXTROSE-NACL 5-0.9 % IV SOLN: INTRAVENOUS | Status: DC

## 2021-04-30 MED ORDER — ACETAMINOPHEN 10 MG/ML IV SOLN
INTRAVENOUS | Status: DC | PRN
  Administered 2021-04-30: 1000 mg via INTRAVENOUS

## 2021-04-30 MED ORDER — DEXAMETHASONE SODIUM PHOSPHATE 10 MG/ML IJ SOLN
INTRAMUSCULAR | Status: AC
  Filled 2021-04-30: qty 1

## 2021-04-30 MED ORDER — MIDAZOLAM HCL 2 MG/2ML IJ SOLN
INTRAMUSCULAR | Status: AC
  Filled 2021-04-30: qty 2

## 2021-04-30 MED ORDER — PROPOFOL 500 MG/50ML IV EMUL
INTRAVENOUS | Status: AC
  Filled 2021-04-30: qty 50

## 2021-04-30 MED ORDER — ACETAMINOPHEN 10 MG/ML IV SOLN
INTRAVENOUS | Status: AC
  Filled 2021-04-30: qty 100

## 2021-04-30 MED ORDER — HEPARIN SODIUM (PORCINE) 5000 UNIT/ML IJ SOLN
5000.0000 [IU] | INTRAMUSCULAR | Status: AC
  Administered 2021-04-30: 5000 [IU] via SUBCUTANEOUS
  Filled 2021-04-30: qty 1

## 2021-04-30 MED ORDER — CHLORHEXIDINE GLUCONATE CLOTH 2 % EX PADS
6.0000 | MEDICATED_PAD | Freq: Every day | CUTANEOUS | Status: DC
  Administered 2021-04-30: 6 via TOPICAL

## 2021-04-30 MED ORDER — FENTANYL CITRATE (PF) 250 MCG/5ML IJ SOLN
INTRAMUSCULAR | Status: AC
  Filled 2021-04-30: qty 5

## 2021-04-30 MED ORDER — ONDANSETRON HCL 4 MG/2ML IJ SOLN
INTRAMUSCULAR | Status: AC
  Filled 2021-04-30: qty 2

## 2021-04-30 MED ORDER — PHENYLEPHRINE 40 MCG/ML (10ML) SYRINGE FOR IV PUSH (FOR BLOOD PRESSURE SUPPORT)
PREFILLED_SYRINGE | INTRAVENOUS | Status: DC | PRN
  Administered 2021-04-30: 80 ug via INTRAVENOUS
  Administered 2021-04-30: 120 ug via INTRAVENOUS

## 2021-04-30 MED ORDER — LIDOCAINE 2% (20 MG/ML) 5 ML SYRINGE
INTRAMUSCULAR | Status: DC | PRN
  Administered 2021-04-30: 60 mg via INTRAVENOUS

## 2021-04-30 MED ORDER — STERILE WATER FOR IRRIGATION IR SOLN
Status: DC | PRN
  Administered 2021-04-30: 1000 mL

## 2021-04-30 MED ORDER — FENTANYL CITRATE (PF) 100 MCG/2ML IJ SOLN: 25.0000 ug | INTRAMUSCULAR | Status: DC | PRN

## 2021-04-30 MED ORDER — ROCURONIUM BROMIDE 10 MG/ML (PF) SYRINGE
PREFILLED_SYRINGE | INTRAVENOUS | Status: AC
  Filled 2021-04-30: qty 10

## 2021-04-30 MED ORDER — MIDAZOLAM HCL 2 MG/2ML IJ SOLN
INTRAMUSCULAR | Status: DC | PRN
  Administered 2021-04-30: 1 mg via INTRAVENOUS

## 2021-04-30 MED ORDER — PHENOL 1.4 % MT LIQD
1.0000 | OROMUCOSAL | Status: DC | PRN
  Filled 2021-04-30: qty 177

## 2021-04-30 MED ORDER — LACTATED RINGERS IV SOLN: INTRAVENOUS | Status: DC

## 2021-04-30 MED ORDER — SODIUM CHLORIDE 0.9% FLUSH: 10.0000 mL | INTRAVENOUS | Status: DC | PRN

## 2021-04-30 MED ORDER — POTASSIUM CHLORIDE 10 MEQ/100ML IV SOLN
10.0000 meq | INTRAVENOUS | Status: DC
Start: 2021-04-30 — End: 2021-04-30
  Administered 2021-04-30 (×3): 10 meq via INTRAVENOUS
  Filled 2021-04-30 (×3): qty 100

## 2021-04-30 MED ORDER — TRAVASOL 10 % IV SOLN
INTRAVENOUS | Status: AC
  Filled 2021-04-30: qty 480

## 2021-04-30 MED ORDER — INSULIN ASPART 100 UNIT/ML IJ SOLN
0.0000 [IU] | Freq: Four times a day (QID) | INTRAMUSCULAR | Status: DC
  Administered 2021-05-01: 2 [IU] via SUBCUTANEOUS
  Administered 2021-05-01 – 2021-05-02 (×3): 1 [IU] via SUBCUTANEOUS
  Administered 2021-05-02: 2 [IU] via SUBCUTANEOUS
  Administered 2021-05-03: 1 [IU] via SUBCUTANEOUS

## 2021-04-30 MED ORDER — DEXAMETHASONE SODIUM PHOSPHATE 10 MG/ML IJ SOLN
INTRAMUSCULAR | Status: DC | PRN
  Administered 2021-04-30: 8 mg via INTRAVENOUS

## 2021-04-30 MED ORDER — LIDOCAINE 2% (20 MG/ML) 5 ML SYRINGE
INTRAMUSCULAR | Status: AC
  Filled 2021-04-30: qty 5

## 2021-04-30 MED ORDER — CHLORHEXIDINE GLUCONATE 0.12 % MT SOLN
15.0000 mL | OROMUCOSAL | Status: AC
  Administered 2021-04-30: 15 mL via OROMUCOSAL

## 2021-04-30 MED ORDER — SUGAMMADEX SODIUM 200 MG/2ML IV SOLN
INTRAVENOUS | Status: DC | PRN
  Administered 2021-04-30: 400 mg via INTRAVENOUS

## 2021-04-30 MED ORDER — MAGNESIUM SULFATE 2 GM/50ML IV SOLN
2.0000 g | Freq: Once | INTRAVENOUS | Status: AC
  Administered 2021-04-30: 2 g via INTRAVENOUS
  Filled 2021-04-30: qty 50

## 2021-04-30 SURGICAL SUPPLY — 45 items
ADH SKN CLS APL DERMABOND .7 (GAUZE/BANDAGES/DRESSINGS) ×1
APL PRP STRL LF DISP 70% ISPRP (MISCELLANEOUS) ×1
BAG COUNTER SPONGE SURGICOUNT (BAG) IMPLANT
BAG SPEC RTRVL LRG 6X4 10 (ENDOMECHANICALS)
BAG SPNG CNTER NS LX DISP (BAG)
BAG SURGICOUNT SPONGE COUNTING (BAG)
CABLE HIGH FREQUENCY MONO STRZ (ELECTRODE) ×2 IMPLANT
CHLORAPREP W/TINT 26 (MISCELLANEOUS) ×3 IMPLANT
CNTNR URN SCR LID CUP LEK RST (MISCELLANEOUS) IMPLANT
CONT SPEC 4OZ STRL OR WHT (MISCELLANEOUS)
COVER SURGICAL LIGHT HANDLE (MISCELLANEOUS) ×3 IMPLANT
DECANTER SPIKE VIAL GLASS SM (MISCELLANEOUS) IMPLANT
DERMABOND ADVANCED (GAUZE/BANDAGES/DRESSINGS) ×2
DERMABOND ADVANCED .7 DNX12 (GAUZE/BANDAGES/DRESSINGS) ×1 IMPLANT
ELECT REM PT RETURN 15FT ADLT (MISCELLANEOUS) ×3 IMPLANT
GLOVE SURG ENC MOIS LTX SZ6 (GLOVE) ×6 IMPLANT
GLOVE SURG ENC MOIS LTX SZ6.5 (GLOVE) ×6 IMPLANT
GOWN STRL REUS W/ TWL LRG LVL3 (GOWN DISPOSABLE) ×2 IMPLANT
GOWN STRL REUS W/TWL LRG LVL3 (GOWN DISPOSABLE) ×12
HOLDER FOLEY CATH W/STRAP (MISCELLANEOUS) IMPLANT
IRRIG SUCT STRYKERFLOW 2 WTIP (MISCELLANEOUS) ×3
IRRIGATION SUCT STRKRFLW 2 WTP (MISCELLANEOUS) IMPLANT
KIT BASIN OR (CUSTOM PROCEDURE TRAY) ×3 IMPLANT
KIT TURNOVER KIT A (KITS) ×3 IMPLANT
MANIPULATOR UTERINE 4.5 ZUMI (MISCELLANEOUS) IMPLANT
PAD POSITIONING PINK XL (MISCELLANEOUS) ×3 IMPLANT
POUCH SPECIMEN RETRIEVAL 10MM (ENDOMECHANICALS) IMPLANT
SCISSORS LAP 5X35 DISP (ENDOMECHANICALS) ×2 IMPLANT
SCRUB EXIDINE 4% CHG 4OZ (MISCELLANEOUS) ×3 IMPLANT
SEALER TISSUE G2 CVD JAW 45CM (ENDOMECHANICALS) IMPLANT
SET TRI-LUMEN FLTR TB AIRSEAL (TUBING) ×2 IMPLANT
SHEET LAVH (DRAPES) ×3 IMPLANT
SLEEVE XCEL OPT CAN 5 100 (ENDOMECHANICALS) ×3 IMPLANT
SUT MNCRL AB 4-0 PS2 18 (SUTURE) ×6 IMPLANT
SUT VIC AB 4-0 PS2 27 (SUTURE) ×2 IMPLANT
SYS RETRIEVAL 5MM INZII UNIV (BASKET) ×3
SYSTEM RETRIEVL 5MM INZII UNIV (BASKET) IMPLANT
TOWEL OR 17X26 10 PK STRL BLUE (TOWEL DISPOSABLE) ×3 IMPLANT
TOWEL OR NON WOVEN STRL DISP B (DISPOSABLE) ×3 IMPLANT
TRAY FOLEY MTR SLVR 16FR STAT (SET/KITS/TRAYS/PACK) ×3 IMPLANT
TRAY LAPAROSCOPIC (CUSTOM PROCEDURE TRAY) ×3 IMPLANT
TROCAR BLADELESS OPT 5 100 (ENDOMECHANICALS) ×5 IMPLANT
TROCAR PORT AIRSEAL 5X120 (TROCAR) ×2 IMPLANT
TROCAR XCEL 12X100 BLDLESS (ENDOMECHANICALS) IMPLANT
TROCAR XCEL BLUNT TIP 100MML (ENDOMECHANICALS) IMPLANT

## 2021-04-30 NOTE — Progress Notes (Signed)
Nutrition Follow-up  DOCUMENTATION CODES:   Severe malnutrition in context of chronic illness  INTERVENTION:   Monitor magnesium, potassium, and phosphorus daily for at least 3 days, MD to replete as needed, as pt is at risk for refeeding syndrome.  -TPN management per Pharmacy   NUTRITION DIAGNOSIS:   Severe Malnutrition related to chronic illness (SBO likely due to carcinomatosis) as evidenced by energy intake < or equal to 75% for > or equal to 1 month, moderate fat depletion, moderate muscle depletion.  Ongoing.  GOAL:   Patient will meet greater than or equal to 90% of their needs  Not meeting yet.  MONITOR:   Diet advancement, PO intake, Supplement acceptance, Labs, Weight trends, I & O's  REASON FOR ASSESSMENT:   Consult New TPN/TNA  ASSESSMENT:   70 yo female with a PMH of HTN and GERD who presents with SBO likely due to carcinomatosis. Pt with stated 30 pound weight loss over past 8 months.  7/31 - attempted paracentesis - unable to perform due to nonstop vomiting (rescheduled for 8/1) 8/01 - advanced to clear liquids  8/02 - made NPO; s/p paracentesis, 2.1L yield 8/03 - diet advanced back to clear liquids 8/4 -Regular diet started for dinner meals then back to NPO, transferred to [facility] chemotherapy  Patient has not been able to maintain a solid diet since admission 7/30 (8 days). TPN to be initiated today. Will be at risk of refeeding syndrome given prolonged time without nutrition and severe malnutrition.  Admission weight: 171 lbs. Last recorded weight 8/2: 169 lbs.  Will order daily weights now that TPN is being started.  Medications: D5 infusion, IV Folic acid, IV Mg sulfate, IV KCl  Labs reviewed.  Diet Order:   Diet Order             Diet NPO time specified Except for: Sips with Meds  Diet effective now                   EDUCATION NEEDS:   Education needs have been addressed  Skin:  Skin Assessment: Reviewed RN Assessment  Last  BM:  8/7  Height:   Ht Readings from Last 1 Encounters:  04/21/21 '5\' 8"'$  (1.727 m)    Weight:   Wt Readings from Last 1 Encounters:  04/24/21 77 kg    BMI:  Body mass index is 25.81 kg/m.  Estimated Nutritional Needs:   Kcal:  2100-2300  Protein:  100-115g  Fluid:  >2L/day  Clayton Bibles, MS, RD, LDN Inpatient Clinical Dietitian Contact information available via Amion

## 2021-04-30 NOTE — Progress Notes (Addendum)
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 Berline Lopes and she has arranged for a diagnostic laparoscopy and biopsy later today She had port placement In the meantime, I recommend continue supportive care Chemo is placed on hold until further results are available, likely Thursday the earliest   Malignant ascites She will likely have reaccumulation of ascites with aggressive IV fluid hydration Monitor closely   Uncontrolled nausea and vomiting, stable Due to abdominal carcinomatosis This is not likely going to get better without treatment Continue antiemetics as needed For now, she does not need NG tube  Malnutrition due to n.p.o. status Will start TPN this evening  Acute renal failure, resolved Due to dehydration and carcinomatosis It is improving Recommend gentle fluid hydration only   Constipation, resolved Continue conservative approach I am concerned about risk of bowel perforation with laxative Monitor closely   Code Status Full code   Goals of care Resolution of bowel obstruction, ability to tolerate oral intake and resolution of nausea   Discharge planning I anticipate she will be in the hospital 5 to 7 days All questions were answered. The patient knows to call the clinic with  any problems, questions or concerns.   [name], NP 04/30/2021 8:18 AM Heath Lark, MD  Subjective:  She feels well.  Denies pain.  Bowels are moving.  For biopsy by GYN oncology later today.  Objective:  Vitals:   04/29/21 2039 04/30/21 0506  BP: 140/88 (!) 137/91  Pulse: 96 (!) 108  Resp: 18 20  Temp: 98.9 F (37.2 C) 98.2 F (36.8 C)  SpO2: 97% 97%     Intake/Output Summary (Last 24 hours) at 04/30/2021 0818 Last data filed at 04/30/2021 0319 Gross per 24 hour  Intake 1370.58 ml  Output --  Net 1370.58 ml    GENERAL:alert, no distress and comfortable  NEURO: alert & oriented x 3 with fluent speech, no focal motor/sensory deficits   Labs:  Recent Labs    05/20/20 1032 04/21/21 1413 04/22/21 0509 04/27/21 0453 04/28/21 0032 04/29/21 0500  NA 142 132*   < > 136 134* 134*  K 3.5 3.8   < > 3.6 4.0 3.8  CL 104 94*   < > 103 100 100  CO2 28 23   < > '27 24 25  '$ GLUCOSE 113* 110*   < > 116* 94 108*  BUN 10 17   < > '10 9 9  '$ CREATININE 1.03* 2.57*   < > 0.93 0.93 0.88  CALCIUM 9.4 9.5   < > 8.6* 8.3* 8.3*  GFRNONAA 56* 20*   < > >60 >60 >60  GFRAA >60  --   --   --   --   --   PROT 7.9 8.1  --  6.2*  --   --   ALBUMIN 4.1  3.5  --  2.9*  --   --   AST 20 12*  --  14*  --   --   ALT 14 10  --  8  --   --   ALKPHOS 73 77  --  49  --   --   BILITOT 0.6 0.7  --  0.6  --   --    < > = values in this interval not displayed.    Studies:  CT ABDOMEN PELVIS WO CONTRAST  Result Date: 04/21/2021 CLINICAL DATA:  Nausea and vomiting for 2 days. Lower abdominal pain for approximately 3 months. Chronic constipation. EXAM: CT ABDOMEN AND PELVIS WITHOUT CONTRAST TECHNIQUE: Multidetector CT imaging of the abdomen and pelvis was performed following the standard protocol without IV contrast. COMPARISON:  None. FINDINGS: Lower chest: No acute findings. Hepatobiliary: No mass visualized on this unenhanced exam. Gallbladder is unremarkable. No evidence of biliary ductal dilatation.  Pancreas: No mass or inflammatory process visualized on this unenhanced exam. Spleen:  Within normal limits in size. Adrenals/Urinary tract: No evidence of urolithiasis or hydronephrosis. Unremarkable unopacified urinary bladder. Stomach/Bowel: Small hiatal hernia is seen containing ascites. Moderate diffuse small bowel dilatation is seen, with suspected transition point in the right lower quadrant, suspicious for distal small bowel obstruction. Vascular/Lymphatic: No pathologically enlarged lymph nodes identified. No evidence of abdominal aortic aneurysm. Aortic atherosclerotic calcification noted. Reproductive: Prior hysterectomy suspected. Moderate ascites is seen. Omental caking and diffuse mesenteric soft tissue stranding is seen. There is also probable peritoneal nodularity along the left pelvic sidewall. These findings are highly suspicious for peritoneal carcinomatosis. Other:  None. Musculoskeletal:  No suspicious bone lesions identified. 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 Suspected distal small bowel obstruction, with probable transition point in right lower quadrant. Aortic Atherosclerosis (ICD10-I70.0). Electronically Signed   By: Marlaine Hind M.D.   On: 04/21/2021 18:36   CT ABDOMEN PELVIS W CONTRAST  Result Date: 04/29/2021 CLINICAL DATA:  70 year old female for staging of peritoneal carcinomatosis prior to chemotherapy. EXAM: CT ABDOMEN AND PELVIS WITH CONTRAST TECHNIQUE: Multidetector CT imaging of the abdomen and pelvis was performed using the standard protocol following bolus administration of intravenous contrast. CONTRAST:  42m OMNIPAQUE IOHEXOL 350 MG/ML SOLN COMPARISON:  04/21/2021 noncontrast study. FINDINGS: Lower chest: A small LEFT pleural effusion with LEFT LOWER lung atelectasis is now noted. A trace RIGHT pleural effusion with mild RIGHT basilar atelectasis is identified. Hepatobiliary: Small hypodense lesions along the INFERIOR RIGHT liver  are compatible with subserosal implants. No other hepatic abnormalities are present. The gallbladder appears mildly thick walled. No biliary dilatation. Pancreas: Unremarkable Spleen: Unremarkable Adrenals/Urinary Tract: The kidneys, adrenal glands and bladder are unremarkable. Stomach/Bowel: No dilated bowel loops are noted. No definite focal bowel wall thickening is identified. Vascular/Lymphatic: Aortic atherosclerosis. No enlarged abdominal or pelvic lymph nodes. Reproductive: Patient is status post hysterectomy. No definite adnexal masses are noted. Other: Moderate ascites within the anterior/mid abdomen extending into the pelvis are noted. There is mild peritoneal thickening/minimal peritoneal irregularity noted in several areas. There are smaller focal fluid/cystic areas within the RIGHT abdomen and LOWER pelvis. Diffuse omental thickening/caking is noted. Mild stranding within the mesentery is also present. No evidence of pneumoperitoneum. Musculoskeletal: No acute or suspicious bony abnormalities are identified. IMPRESSION: 1. Moderate ascites, mild peritoneal thickening and diffuse omental thickening/caking and smaller focal fluid/cystic areas within the RIGHT abdomen and LOWER pelvis, compatible with peritoneal carcinomatosis. Mild mesenteric stranding is nonspecific. 2. Small subserosal implants along the INFERIOR  RIGHT liver. 3. Small LEFT pleural effusion and trace RIGHT pleural effusion with LEFT LOWER lung atelectasis. 4. Aortic Atherosclerosis (ICD10-I70.0). Electronically Signed   By: Margarette Canada M.D.   On: 04/29/2021 12:16   US Abdomen Limited  Result Date: 04/22/2021 CLINICAL DATA:  Peritoneal neoplasm abdominal distension, recurrent ascites EXAM: LIMITED ABDOMEN ULTRASOUND FOR ASCITES TECHNIQUE: Limited ultrasound survey for ascites was performed in all four abdominal quadrants. COMPARISON:  04/21/2021 CT FINDINGS: Survey of the abdominal 4 quadrants demonstrates a small volume of lower  abdominal ascites by ultrasound. Unable to perform paracentesis because of intractable nausea vomiting today. IMPRESSION: Small volume of lower abdominopelvic ascites. Will reassess for paracentesis tomorrow 04/23/2021. Electronically Signed   By: Jerilynn Mages.  Shick M.D.   On: 04/22/2021 12:09   DG Abd 2 Views  Result Date: 04/28/2021 CLINICAL DATA:  Small bowel obstruction. EXAM: ABDOMEN - 2 VIEW COMPARISON:  04/26/2021 and earlier FINDINGS: There is focal atelectasis or. No free intraperitoneal consolidation MEDIAL LEFT LOWER lobe air. Persistent mild dilatation of small bowel loops in the LEFT central abdomen, similar in appearance to previous CT exam. Air is identified within nondilated loops of colon. There are degenerative changes in both hips. IMPRESSION: 1. Persistent mild dilatation of small bowel loops in the LEFT central abdomen. 2. LEFT LOWER lobe atelectasis or consolidation. Electronically Signed   By: Nolon Nations M.D.   On: 04/28/2021 10:13   DG Abd 2 Views  Result Date: 04/26/2021 CLINICAL DATA:  Follow-up small bowel obstruction. EXAM: ABDOMEN - 2 VIEW COMPARISON:  A2 22 FINDINGS: There is mild residual gaseous distension of multiple small bowel loops, decreased from prior. A small amount of gas is scattered in the colon and rectum. No intraperitoneal free air is identified. Atelectasis is noted in the left lung base. IMPRESSION: Decreased small bowel distension compatible with improving obstruction. Electronically Signed   By: Logan Bores M.D.   On: 04/26/2021 12:45   DG Abd Portable 1V  Result Date: 04/24/2021 CLINICAL DATA:  Small-bowel obstruction. EXAM: PORTABLE ABDOMEN - 1 VIEW COMPARISON:  04/22/2021.  CT 04/21/2021. FINDINGS: Mild-to-moderately distended loops of small bowel with a relative paucity of colonic gas again noted. Findings again consistent with small bowel obstruction. No free air. Ascites most likely present. Pelvic calcifications consistent phleboliths. Degenerative  changes lumbar spine and both hips. IMPRESSION: Mild-to-moderate small-bowel distention with a paucity of intra colonic air again noted. Findings again consistent with small bowel obstruction. Ascites most likely present. Electronically Signed   By: Marcello Moores  Register   On: 04/24/2021 11:08   DG Abd Portable 1V-Small Bowel Obstruction Protocol-initial, 8 hr delay  Result Date: 04/22/2021 CLINICAL DATA:  Small bowel obstruction, 8 hour film. EXAM: PORTABLE ABDOMEN - 1 VIEW COMPARISON:  CT yesterday. FINDINGS: Administered enteric contrast has reached the ascending and proximal transverse colon. There is enteric contrast within prominent small bowel in the central abdomen. Left upper quadrant contrast may be residual within small bowel or at the splenic flexure. No evidence of free air. IMPRESSION: Administered enteric contrast has reached the ascending and proximal transverse colon. There is enteric contrast within prominent small bowel in the central abdomen. Left upper quadrant contrast may be residual within small bowel or at the splenic flexure. Electronically Signed   By: Keith Rake M.D.   On: 04/22/2021 17:38   IR IMAGING GUIDED PORT INSERTION  Result Date: 04/26/2021 CLINICAL DATA:  Peritoneal carcinomatosis, unknown primary, access for chemotherapy EXAM: RIGHT INTERNAL JUGULAR SINGLE LUMEN POWER PORT CATHETER INSERTION  Date:  04/26/2021 04/26/2021 2:15 pm Radiologist:  M. Daryll Brod, MD Guidance:  Ultrasound and fluoroscopic MEDICATIONS: 1% lidocaine with epinephrine local ANESTHESIA/SEDATION: Versed 2.0 mg IV; Fentanyl 100 mcg IV; Moderate Sedation Time:  25 minutes The patient was continuously monitored during the procedure by the interventional radiology nurse under my direct supervision. FLUOROSCOPY TIME:  One minutes, 0 seconds (5 mGy) COMPLICATIONS: None immediate. CONTRAST:  None. PROCEDURE: Informed consent was obtained from the patient following explanation of the procedure, risks, benefits  and alternatives. The patient understands, agrees and consents for the procedure. All questions were addressed. A time out was performed. Maximal barrier sterile technique utilized including caps, mask, sterile gowns, sterile gloves, large sterile drape, hand hygiene, and 2% chlorhexidine scrub. Under sterile conditions and local anesthesia, right internal jugular micropuncture venous access was performed. Access was performed with ultrasound. Images were obtained for documentation of the patent right internal jugular vein. A guide wire was inserted followed by a transitional dilator. This allowed insertion of a guide wire and catheter into the IVC. Measurements were obtained from the SVC / RA junction back to the right IJ venotomy site. In the right infraclavicular chest, a subcutaneous pocket was created over the second anterior rib. This was done under sterile conditions and local anesthesia. 1% lidocaine with epinephrine was utilized for this. A 2.5 cm incision was made in the skin. Blunt dissection was performed to create a subcutaneous pocket over the right pectoralis major muscle. The pocket was flushed with saline vigorously. There was adequate hemostasis. The port catheter was assembled and checked for leakage. The port catheter was secured in the pocket with two retention sutures. The tubing was tunneled subcutaneously to the right venotomy site and inserted into the SVC/RA junction through a valved peel-away sheath. Position was confirmed with fluoroscopy. Images were obtained for documentation. The patient tolerated the procedure well. No immediate complications. Incisions were closed in a two layer fashion with 4 - 0 Vicryl suture. Dermabond was applied to the skin. The port catheter was accessed, blood was aspirated followed by saline and heparin flushes. Needle was removed. A dry sterile dressing was applied. IMPRESSION: Ultrasound and fluoroscopically guided right internal jugular single lumen power  port catheter insertion. Tip in the SVC/RA junction. Catheter ready for use. Electronically Signed   By: Jerilynn Mages.  Shick M.D.   On: 04/26/2021 14:22   IR Paracentesis  Result Date: 04/24/2021 INDICATION: Abdominal distention secondary to ascites. Underlying concern for peritoneal carcinomatosis on imaging findings. Request for diagnostic and therapeutic paracentesis. EXAM: ULTRASOUND GUIDED LEFT LOWER QUADRANT PARACENTESIS MEDICATIONS: 1% plain lidocaine, 5 mL COMPLICATIONS: None immediate. PROCEDURE: Informed written consent was obtained from the patient after a discussion of the risks, benefits and alternatives to treatment. A timeout was performed prior to the initiation of the procedure. Initial ultrasound scanning demonstrates a large amount of ascites within the left lower abdominal quadrant. The left lower abdomen was prepped and draped in the usual sterile fashion. 1% lidocaine was used for local anesthesia. Following this, a 19 gauge, 7-cm, Yueh catheter was introduced. An ultrasound image was saved for documentation purposes. The paracentesis was performed. The catheter was removed and a dressing was applied. The patient tolerated the procedure well without immediate post procedural complication. FINDINGS: A total of approximately 2.1 L of clear yellow fluid was removed. Samples were sent to the laboratory as requested by the clinical team. IMPRESSION: Successful ultrasound-guided paracentesis yielding 2.1 liters of peritoneal fluid. Read by: Ascencion Dike PA-C Electronically Signed  By: Michaelle Birks MD   On: 04/24/2021 12:38

## 2021-04-30 NOTE — Progress Notes (Signed)
PROGRESS NOTE    Brenda Stewart  [id number] DOB: 08/25/1951 DOA: 04/21/2021 PCP: Pcp, No      Brief Narrative:  Brenda Stewart is a 70 y/o F with HTN, GERD who presented with abdominal pain and was found to have SBO due to peritoneal carcinomatosis.  General surgery and oncology consulted and following.  Patient transferred to [facility] hospital to start chemotherapy.  Awaiting pathology prior to this.     Assessment & Plan:   Principal Problem:   SBO (small bowel obstruction) (HCC) Active Problems:   AKI (acute kidney injury) ([facility])   Prolonged QT interval   Unintended weight loss   Nausea & vomiting   Hyponatremia   Neoplasm of peritoneum   Leukocytosis   Ascites   Thrombocytosis   Protein-calorie malnutrition, severe   Peritoneal carcinomatosis (HCC)   Hypokalemia  small bowel obstruction -Likely secondary to ovarian or fallopian tube cancer, peritoneal carcinomatosis. -Biopsy to be done this week? -Cytology appears to be reactive cells -Patient underwent trial of clear liquids x2 with nausea and emesis and currently n.p.o. except ice chips. -Abdominal films done 04/26/2021 with decreased small bowel distention compatible with improving obstruction. -Patient stated NG tube placement was attempted at [facility] x3 per RN which was unsuccessful and patient very hesitant/resistant for NG tube placement if needed at this time. -Patient with improvement of heartburn with GI cocktail. -Continue IV PPI daily -Continue IV antiemetics, gentle hydration, supportive care.  -CT-guided biopsy of omental caking for tissue biopsy per IR has been ordered per oncology but unable to be done due to no target lesion. -General surgery was following but have signed off as of 04/27/2021.   -Per oncology.    Peritoneal carcinomatosis/elevated CA125/concern for ovarian or fallopian tube cancer with biopsies pending -Pathology report pending per oncology note. -Status post  paracentesis- cytology unrevealing -Patient status post port placement 04/26/2021. -Oncology following and hoping to start patient on chemotherapy with carboplatin and paclitaxel however awaiting biopsies. -GYN oncologist on-call, Dr. Berline Lopes and she will try to arrange diagnostic laparoscopy and biopsy this week -Per oncology.  Malignant ascites -Status post paracentesis. -Concern for reaccumulation of ascites. -Cytology unrevealing -Per oncology.   Acute renal failure -Secondary to prerenal azotemia.   -Resolved with hydration.    Prolonged QT interval -Repeat EKG with resolution of QT prolongation. -Keep potassium close to 4, magnesium close to 2. -Follow.  Severe protein calorie malnutrition/unintended weight loss -Likely secondary to malignancy -Patient currently n.p.o. secondary to problem #1. -If no significant improvement may need to be started on TNA and may require a separate PICC line as patient to receive chemotherapy through port. -We will defer initiation of TNA to oncology.  Thrombocytosis -Resolved.  Hypomagnesemia -Repleted  Hyponatremia -trend  Iron deficiency anemia/folate deficiency -Anemia panel consistent with iron deficiency anemia and folate deficiency. -Folate level high at 5.0, iron at 18, ferritin on 415.  Vitamin B12 at [id number]. -Continue folic acid 1 mg IV daily as patient NPO.   -May benefit from dose of IV Feraheme during this hospitalization however will defer to hematology/oncology.  -Hemoglobin stable at 11.  -Transfusion threshold hemoglobin < 7.     DVT prophylaxis: Heparin>>>> Lovenox Code Status: Full Family Communication: Updated patient.  No family at bedside. Disposition:   Status is: Inpatient  Remains inpatient appropriate because:Inpatient level of care appropriate due to severity of illness  Dispo: The patient is from: Home  Anticipated d/c is to: Home              Patient currently is not medically stable to  d/c.- needs biopsy and initiation of chemo-- at least 1 more week in the hospital    Difficult to place patient No       Consultants:  GS: Dr. Rosendo Gros 04/21/2020 Oncology: Dr. Lorna Few 04/22/2020 GYN oncology: Dr. Denman George 04/24/2020  Procedures:  CT abdomen pelvis 04/21/2021 Abdominal films 04/22/2021, 04/24/2021, 04/26/2021 Abdominal ultrasound 04/22/2021 Ultrasound-guided left lower quadrant paracentesis--2.1 L of peritoneal fluid removed, per IR, Dr.Mugweru  Right IJ PowerPort placed per IR, Dr. Annamaria Boots 04/26/2021 CT abdomen and pelvis 04/29/2021      Subjective: +soft BMs   Objective: Vitals:   04/29/21 0455 04/29/21 1334 04/29/21 2039 04/30/21 0506  BP: (!) 144/99 (!) 138/107 140/88 (!) 137/91  Pulse: (!) 108 97 96 (!) 108  Resp: '18 18 18 20  '$ Temp: 98.1 F (36.7 C) 98.3 F (36.8 C) 98.9 F (37.2 C) 98.2 F (36.8 C)  TempSrc: Oral Oral Oral Oral  SpO2: 96% 99% 97% 97%  Weight:      Height:        Intake/Output Summary (Last 24 hours) at 04/30/2021 0851 Last data filed at 04/30/2021 0319 Gross per 24 hour  Intake 1370.58 ml  Output --  Net 1370.58 ml   Filed Weights   04/21/21 1704 04/24/21 0546  Weight: 77.6 kg 77 kg    Examination:  General: Appearance:     Overweight female in no acute distress     Lungs:     respirations unlabored  Heart:    Tachycardic.    MS:   All extremities are intact.    Neurologic:   Awake, alert, oriented x 3. No apparent focal neurological           defect.      Data Reviewed: I have personally reviewed following labs and imaging studies  CBC: Recent Labs  Lab 04/24/21 0056 04/25/21 2343 04/27/21 0453 04/28/21 0032 04/29/21 0500  WBC 9.3 9.2 9.3 9.4 11.8*  NEUTROABS  --   --  7.1  --   --   HGB 11.0* 11.8* 10.9* 10.7* 11.0*  HCT 32.8* 34.9* 33.0* 32.5* 33.4*  MCV 91.6 90.6 91.4 91.8 91.8  PLT 396 360 311 289 [id number]    Basic Metabolic Panel: Recent Labs  Lab 04/24/21 0056 04/25/21 2343 04/26/21 0914  04/27/21 0453 04/28/21 0032 04/29/21 0500  NA 135 136  --  136 134* 134*  K 4.0 3.5  --  3.6 4.0 3.8  CL 99 100  --  103 100 100  CO2 24 26  --  '27 24 25  '$ GLUCOSE 105* 104*  --  116* 94 108*  BUN 16 11  --  '10 9 9  '$ CREATININE 1.26* 1.07*  --  0.93 0.93 0.88  CALCIUM 9.1 8.7*  --  8.6* 8.3* 8.3*  MG  --   --  1.5* 2.1 1.7 2.2  PHOS  --   --   --  3.3  --   --     GFR: Estimated Creatinine Clearance: 66.7 mL/min (by C-G formula based on SCr of 0.88 mg/dL).  Liver Function Tests: Recent Labs  Lab 04/27/21 0453  AST 14*  ALT 8  ALKPHOS 49  BILITOT 0.6  PROT 6.2*  ALBUMIN 2.9*    CBG: Recent Labs  Lab 04/28/21 0728  GLUCAP 92     Recent Results (from  the past 240 hour(s))  Resp Panel by RT-PCR (Flu A&B, Covid) Nasopharyngeal Swab     Status: None   Collection Time: 04/21/21  6:49 PM   Specimen: Nasopharyngeal Swab; Nasopharyngeal(NP) swabs in vial transport medium  Result Value Ref Range Status   SARS Coronavirus 2 by RT PCR NEGATIVE NEGATIVE Final    Comment: (NOTE) SARS-CoV-2 target nucleic acids are NOT DETECTED.  The SARS-CoV-2 RNA is generally detectable in upper respiratory specimens during the acute phase of infection. The lowest concentration of SARS-CoV-2 viral copies this assay can detect is 138 copies/mL. A negative result does not preclude SARS-Cov-2 infection and should not be used as the sole basis for treatment or other patient management decisions. A negative result may occur with  improper specimen collection/handling, submission of specimen other than nasopharyngeal swab, presence of viral mutation(s) within the areas targeted by this assay, and inadequate number of viral copies(<138 copies/mL). A negative result must be combined with clinical observations, patient history, and epidemiological information. The expected result is Negative.  Fact Sheet for Patients:  EntrepreneurPulse.com.au  Fact Sheet for Healthcare  Providers:  IncredibleEmployment.be  This test is no t yet approved or cleared by the [place] FDA and  has been authorized for detection and/or diagnosis of SARS-CoV-2 by FDA under an Emergency Use Authorization (EUA). This EUA will remain  in effect (meaning this test can be used) for the duration of the COVID-19 declaration under Section 564(b)(1) of the Act, 21 U.S.C.section 360bbb-3(b)(1), unless the authorization is terminated  or revoked sooner.       Influenza A by PCR NEGATIVE NEGATIVE Final   Influenza B by PCR NEGATIVE NEGATIVE Final    Comment: (NOTE) The Xpert Xpress SARS-CoV-2/FLU/RSV plus assay is intended as an aid in the diagnosis of influenza from Nasopharyngeal swab specimens and should not be used as a sole basis for treatment. Nasal washings and aspirates are unacceptable for Xpert Xpress SARS-CoV-2/FLU/RSV testing.  Fact Sheet for Patients: EntrepreneurPulse.com.au  Fact Sheet for Healthcare Providers: IncredibleEmployment.be  This test is not yet approved or cleared by the [place] FDA and has been authorized for detection and/or diagnosis of SARS-CoV-2 by FDA under an Emergency Use Authorization (EUA). This EUA will remain in effect (meaning this test can be used) for the duration of the COVID-19 declaration under Section 564(b)(1) of the Act, 21 U.S.C. section 360bbb-3(b)(1), unless the authorization is terminated or revoked.  Performed at [facility], [place] [address], [place], [place] [address]   Body fluid culture w Gram Stain     Status: None   Collection Time: 04/24/21  1:55 PM   Specimen: Abdomen; Peritoneal Fluid  Result Value Ref Range Status   Specimen Description PERITONEAL FLUID  Final   Special Requests ABDOMEN  Final   Gram Stain   Final    WBC PRESENT, PREDOMINANTLY MONONUCLEAR NO ORGANISMS SEEN CYTOSPIN SMEAR    Culture   Final    NO GROWTH 3 DAYS Performed  at [facility], [place] [address], [place], [place] [address]    Report Status 04/27/2021 FINAL  Final          Radiology Studies: CT ABDOMEN PELVIS W CONTRAST  Result Date: 04/29/2021 CLINICAL DATA:  70 year old female for staging of peritoneal carcinomatosis prior to chemotherapy. EXAM: CT ABDOMEN AND PELVIS WITH CONTRAST TECHNIQUE: Multidetector CT imaging of the abdomen and pelvis was performed using the standard protocol following bolus administration of intravenous contrast. CONTRAST:  66m OMNIPAQUE IOHEXOL  350 MG/ML SOLN COMPARISON:  04/21/2021 noncontrast study. FINDINGS: Lower chest: A small LEFT pleural effusion with LEFT LOWER lung atelectasis is now noted. A trace RIGHT pleural effusion with mild RIGHT basilar atelectasis is identified. Hepatobiliary: Small hypodense lesions along the INFERIOR RIGHT liver are compatible with subserosal implants. No other hepatic abnormalities are present. The gallbladder appears mildly thick walled. No biliary dilatation. Pancreas: Unremarkable Spleen: Unremarkable Adrenals/Urinary Tract: The kidneys, adrenal glands and bladder are unremarkable. Stomach/Bowel: No dilated bowel loops are noted. No definite focal bowel wall thickening is identified. Vascular/Lymphatic: Aortic atherosclerosis. No enlarged abdominal or pelvic lymph nodes. Reproductive: Patient is status post hysterectomy. No definite adnexal masses are noted. Other: Moderate ascites within the anterior/mid abdomen extending into the pelvis are noted. There is mild peritoneal thickening/minimal peritoneal irregularity noted in several areas. There are smaller focal fluid/cystic areas within the RIGHT abdomen and LOWER pelvis. Diffuse omental thickening/caking is noted. Mild stranding within the mesentery is also present. No evidence of pneumoperitoneum. Musculoskeletal: No acute or suspicious bony abnormalities are identified. IMPRESSION: 1. Moderate ascites, mild peritoneal thickening and  diffuse omental thickening/caking and smaller focal fluid/cystic areas within the RIGHT abdomen and LOWER pelvis, compatible with peritoneal carcinomatosis. Mild mesenteric stranding is nonspecific. 2. Small subserosal implants along the INFERIOR RIGHT liver. 3. Small LEFT pleural effusion and trace RIGHT pleural effusion with LEFT LOWER lung atelectasis. 4. Aortic Atherosclerosis (ICD10-I70.0). Electronically Signed   By: Margarette Canada M.D.   On: 04/29/2021 12:16        Scheduled Meds:  Chlorhexidine Gluconate Cloth  6 each Topical Daily   multivitamin with minerals  1 tablet Oral Daily   pantoprazole (PROTONIX) IV  40 mg Intravenous Daily   Continuous Infusions:  dextrose 5 % and 0.9% NaCl 50 mL/hr at [id number]   folic acid (FOLVITE) IVPB Stopped (04/29/21 1106)     LOS: 9 days    Time spent: 35 minutes    Geradine Girt, DO [facility]   To contact the attending provider between 7A-7P or the covering provider during after hours 7P-7A, please log into the web site [email or web address] and access using universal [place] password for that web site. If you do not have the password, please call the hospital operator.  04/30/2021, 8:51 AM

## 2021-04-30 NOTE — Progress Notes (Signed)
PHARMACY - TOTAL PARENTERAL NUTRITION CONSULT NOTE   Indication: Gyn malignancy, unable to take po  Patient Measurements: Height: '5\' 8"'$  (172.7 cm) Weight: 77 kg (169 lb 12.1 oz) IBW/kg (Calculated) : 63.9 TPN AdjBW (KG): 77.6 Body mass index is 25.81 kg/m. TBW 77 kg  Assessment: 43 yoF with peritoneal carcinomatosis, primary prob ovarian/fallopian, unable to obtain perc biopsy with CT > need diag lap with biopsy scheduled for 8/8, planned TPN after surgery - but ordered 8/8. Anticipate ascites will accumulate. Chemotherapy planned after biopsy results  Glucose / Insulin:  Electrolytes: wnl, Na & K at low end of range Renal: wnl Hepatic: wnl on admit Intake / Output; MIVF: UOP not measured, Current IV fluid D5NS at 50 ml/hr GI Imaging: 8/7 Abd/Pelvic CT with contrast: peritoneal carcinomatosis, moderate ascites, diffuse omental caking/cystic areas - none amenable to percutaneous biopsy. GI Surgeries / Procedures: plan Diagnostic Lap/biopsy 8/8  Central access: Has PAC placed 8/4, anticipate placement of PICC  TPN start date: plan 8/8  Nutritional Goals (per RD recommendation on): awaiting RD note with recommendations kCal: , Protein: , Fluid:  Goal TPN rate is  mL/hr (provides  g of protein and  kcals per day) Starting at 40 ml/hr - delivers 48 g protein, 970 kCals  Current Nutrition:  NPO  Plan:  Mag Sulfate 2gm bolus x1 KCl 10 mEq runs q1 hr x3  AT 1800:  Start TPN at 40 mL/hr at 1800 Electrolytes in TPN: Na 71mq/L, K 592m/L, Ca 49m29mL, Mg 49mE44m, and Phos 149mm549m. Cl:Ac 1:1 Add standard MVI and trace elements to TPN Initiate Sensitive q6h SSI and adjust as needed  Reduce MIVF to 20 mL/hr at 1800 Monitor TPN labs on Mon/Thurs, full labs in am 8/9  GreenMinda DittomD 04/30/2021,10:44 AM

## 2021-04-30 NOTE — Op Note (Signed)
OPERATIVE NOTE  Pre-operative Diagnosis: SBO, carcinomatosis on imaging, elevated CA-125, non diagnostic paracentesis  Post-operative Diagnosis: same, diffuse carcinomatosis  Operation: Diagnostic laparoscopy, peritoneal biopsies (left pelvic side wall, anterior abdomen)  Surgeon: Jeral Pinch MD  Assistant Surgeon: Joylene John NP  Anesthesia: GET  Urine Output: 140cc  Operative Findings: On exam under anesthesia, uterus surgically absent.  Nodularity and fullness noted in the cul-de-sac, rather nonmobile.  Rectum is free.  On intra-abdominal entry, approximately 1.6 cc of straw-colored ascites noted and removed.  Liver edge, left upper diaphragm, abdominal wall notable for diffuse carcinomatosis.  Omental cake noted in the left upper quadrant.  Right liver and diaphragm not able to be visualized secondary to adhesions.  Multiple loops of small bowel adherent to the anterior abdominal wall along the midline at the level of the umbilicus and above.  Multiple loops of small bowel adherent within the pelvis, obscuring view of the either adnexa.  Estimated Blood Loss:  Minimal      Total IV Fluids: see I&O flowsheet         Specimens: Peritoneal biopsies         Complications:  None apparent; patient tolerated the procedure well.         Disposition: PACU - hemodynamically stable.  Procedure Details  The patient was seen in the Holding Room. The risks, benefits, complications, treatment options, and expected outcomes were discussed with the patient.  The patient concurred with the proposed plan, giving informed consent.  The site of surgery properly noted/marked. The patient was identified as Brenda Stewart and the procedure verified as a dx lsc with biopsies.   After induction of anesthesia, the patient was draped and prepped in the usual sterile manner. Patient was placed in supine position after anesthesia and draped and prepped in the usual sterile manner as follows: Her arms  were tucked to her side with all appropriate precautions.  The shoulders were stabilized with padded shoulder blocks applied to the acromium processes.  The patient was placed in the semi-lithotomy position in [place].  The patient was draped after the CholoraPrep had been allowed to dry for 3 minutes.  A Time Out was held and the above information confirmed.  The urethra was prepped with Betadine. Foley catheter was placed.    OG tube placement was confirmed and to suction.   Next, a 5 mm skin incision was made 1 cm below the subcostal margin in the midclavicular line.  The 5 mm Optiview port and scope was used for direct entry.  Opening pressure was under 10 mm CO2.  The abdomen was insufflated and the findings were noted as above.   At this point and all points during the procedure, the patient's intra-abdominal pressure did not exceed 15 mmHg. Next, a 37m incision was made in the mid left abdomen and one in the midline. A 52mtrocar was placed in both incisions.  An area of peritoneum along the left sidewall was noted as having multiple nodules consistent with carcinomatosis.  Combination of traction with graspers and monopolar electrocautery using scissors was used to cauterize and excise the peritoneum.  This was sent for frozen to assure diagnosis would be achieved.  Frozen section noted mostly bland tissue but some areas of hypercellularity with request for additional tissue if possible.  A second area of carcinomatosis along the anterior abdominal wall peritoneum was grasped and monopolar electrocautery using scissors was used to cauterize and excise additional tissue for pathology review.  Hemostasis was  noted.  All instruments were removed and the abdomen was desufflated.  Trochars were removed.  The subcuticular tissue was closed with 4-0 Vicryl and the skin was closed with 4-0 Monocryl in a subcuticular manner.  Dermabond was applied.    Foley catheter was discontinued.  All sponge,  lap and needle counts were correct x  3.   The patient was transferred to the recovery room in stable condition.  Jeral Pinch, MD

## 2021-04-30 NOTE — Interval H&P Note (Signed)
History and Physical Interval Note: Paracentesis not diagnostic. IR not able to percutaneously biopsy for tissue diagnosis. Discussed with patient recommendation for dx lsc and biopsy.  04/30/2021 3:34 PM  Brenda Stewart  has presented today for surgery, with the diagnosis of PERITONEAL CARCINOMATOSIS.  The various methods of treatment have been discussed with the patient and family. After consideration of risks, benefits and other options for treatment, the patient has consented to  Procedure(s): LAPAROSCOPY DIAGNOSTIC WITH BIOPSIES (N/A) as a surgical intervention.  The patient's history has been reviewed, patient examined, no change in status, stable for surgery.  I have reviewed the patient's chart and labs.  Questions were answered to the patient's satisfaction.     Lafonda Mosses

## 2021-04-30 NOTE — Anesthesia Preprocedure Evaluation (Addendum)
Anesthesia Evaluation  Patient identified by MRN, date of birth, ID band Patient awake    Reviewed: Allergy & Precautions, NPO status , Patient's Chart, lab work & pertinent test results  Airway Mallampati: II  TM Distance: >3 FB Neck ROM: Full    Dental  (+) Dental Advisory Given, Missing, Poor Dentition   Pulmonary neg pulmonary ROS,    Pulmonary exam normal breath sounds clear to auscultation       Cardiovascular hypertension, Pt. on medications (-) angina(-) Past MI Normal cardiovascular exam Rhythm:Regular Rate:Normal     Neuro/Psych negative neurological ROS  negative psych ROS   GI/Hepatic Neg liver ROS, GERD  ,  Endo/Other  negative endocrine ROS  Renal/GU negative Renal ROS     Musculoskeletal negative musculoskeletal ROS (+)   Abdominal   Peds  Hematology  (+) Blood dyscrasia, anemia ,   Anesthesia Other Findings Day of surgery medications reviewed with the patient.  Reproductive/Obstetrics PERITONEAL CARCINOMATOSIS                            Anesthesia Physical Anesthesia Plan  ASA: 3  Anesthesia Plan: General   Post-op Pain Management:    Induction: Intravenous  PONV Risk Score and Plan: 4 or greater and Midazolam, Dexamethasone, Ondansetron and Treatment may vary due to age or medical condition  Airway Management Planned: Oral ETT  Additional Equipment:   Intra-op Plan:   Post-operative Plan: Extubation in OR  Informed Consent: I have reviewed the patients History and Physical, chart, labs and discussed the procedure including the risks, benefits and alternatives for the proposed anesthesia with the patient or authorized representative who has indicated his/her understanding and acceptance.     Dental advisory given  Plan Discussed with: CRNA  Anesthesia Plan Comments:         Anesthesia Quick Evaluation

## 2021-04-30 NOTE — Progress Notes (Signed)
VAST RN at bedside starting TPN via port when patient began complaining of chest pain radiating down the center of her body. Pt stated it was sharp and she has never felt any pain like this. Notified unit RN and Chartered certified accountant. Pt attempted lying on left side and sitting straight up in bed, neither of which helped ease pain. Nurse tech in room to get VS and check 12 lead.

## 2021-04-30 NOTE — Anesthesia Procedure Notes (Signed)
Procedure Name: Intubation Date/Time: 04/30/2021 3:36 PM Performed by: Hedda Slade, CRNA Pre-anesthesia Checklist: Patient identified, Patient being monitored, Timeout performed, Emergency Drugs available and Suction available Patient Re-evaluated:Patient Re-evaluated prior to induction Oxygen Delivery Method: Circle system utilized Preoxygenation: Pre-oxygenation with 100% oxygen Induction Type: IV induction Ventilation: Mask ventilation without difficulty Laryngoscope Size: Mac and 4 Grade View: Grade I Tube type: Oral Tube size: 7.0 mm Number of attempts: 1 Airway Equipment and Method: Stylet Placement Confirmation: ETT inserted through vocal cords under direct vision, positive ETCO2 and breath sounds checked- equal and bilateral Secured at: 21 cm Tube secured with: Tape Dental Injury: Teeth and Oropharynx as per pre-operative assessment

## 2021-04-30 NOTE — Care Management Important Message (Signed)
Important Message  Patient Details IM Letter given to the Patient. Name: BAYLOR RANUM MRN: [id number] Date of Birth: 1951-08-14   Medicare Important Message Given:  Yes     [name] 04/30/2021, 12:19 PM

## 2021-04-30 NOTE — Transfer of Care (Signed)
Immediate Anesthesia Transfer of Care Note  Patient: Brenda Stewart  Procedure(s) Performed: LAPAROSCOPY DIAGNOSTIC WITH BIOPSIES  Patient Location: PACU  Anesthesia Type:General  Level of Consciousness: awake, alert , oriented and patient cooperative  Airway & Oxygen Therapy: Patient Spontanous Breathing and Patient connected to face mask oxygen  Post-op Assessment: Report given to RN and Post -op Vital signs reviewed and stable  Post vital signs: Reviewed and stable  Last Vitals:  Vitals Value Taken Time  BP    Temp    Pulse 36 04/30/21 1711  Resp 18 04/30/21 1711  SpO2 70 % 04/30/21 1711  Vitals shown include unvalidated device data.  Last Pain:  Vitals:   04/30/21 1416  TempSrc:   PainSc: 0-No pain         Complications: No notable events documented.

## 2021-04-30 NOTE — Anesthesia Postprocedure Evaluation (Signed)
Anesthesia Post Note  Patient: Brenda Stewart  Procedure(s) Performed: LAPAROSCOPY DIAGNOSTIC WITH BIOPSIES     Patient location during evaluation: PACU Anesthesia Type: General Level of consciousness: awake and alert Pain management: pain level controlled Vital Signs Assessment: post-procedure vital signs reviewed and stable Respiratory status: spontaneous breathing, nonlabored ventilation, respiratory function stable and patient connected to nasal cannula oxygen Cardiovascular status: blood pressure returned to baseline and stable Postop Assessment: no apparent nausea or vomiting Anesthetic complications: no   No notable events documented.  Last Vitals:  Vitals:   04/30/21 1730 04/30/21 1745  BP: 130/86 131/89  Pulse: 92 91  Resp: 19 16  Temp:    SpO2: 100% 99%    Last Pain:  Vitals:   04/30/21 1708  TempSrc:   PainSc: 0-No pain                 Catalina Gravel

## 2021-05-01 ENCOUNTER — Encounter (HOSPITAL_COMMUNITY): Payer: Self-pay | Admitting: Gynecologic Oncology

## 2021-05-01 DIAGNOSIS — K56609 Unspecified intestinal obstruction, unspecified as to partial versus complete obstruction: Secondary | ICD-10-CM | POA: Diagnosis not present

## 2021-05-01 LAB — COMPREHENSIVE METABOLIC PANEL
ALT: 9 U/L (ref 0–44)
AST: 13 U/L — ABNORMAL LOW (ref 15–41)
Albumin: 2.6 g/dL — ABNORMAL LOW (ref 3.5–5.0)
Alkaline Phosphatase: 48 U/L (ref 38–126)
Anion gap: 14 (ref 5–15)
BUN: 8 mg/dL (ref 8–23)
CO2: 21 mmol/L — ABNORMAL LOW (ref 22–32)
Calcium: 8.5 mg/dL — ABNORMAL LOW (ref 8.9–10.3)
Chloride: 102 mmol/L (ref 98–111)
Creatinine, Ser: 0.86 mg/dL (ref 0.44–1.00)
GFR, Estimated: >60 mL/min (ref >60)
Glucose, Bld: 160 mg/dL — ABNORMAL HIGH (ref 70–99)
Potassium: 4.7 mmol/L (ref 3.5–5.1)
Sodium: 137 mmol/L (ref 135–145)
Total Bilirubin: 0.3 mg/dL (ref 0.3–1.2)
Total Protein: 6.2 g/dL — ABNORMAL LOW (ref 6.5–8.1)

## 2021-05-01 LAB — CBC
HCT: 30.2 % — ABNORMAL LOW (ref 36.0–46.0)
HCT: 33.6 % — ABNORMAL LOW (ref 36.0–46.0)
Hemoglobin: 10.1 g/dL — ABNORMAL LOW (ref 12.0–15.0)
Hemoglobin: 10.8 g/dL — ABNORMAL LOW (ref 12.0–15.0)
MCH: 30.7 pg (ref 26.0–34.0)
MCH: 30.2 pg (ref 26.0–34.0)
MCHC: 33.4 g/dL (ref 30.0–36.0)
MCHC: 32.1 g/dL (ref 30.0–36.0)
MCV: 91.8 fL (ref 80.0–100.0)
MCV: 93.9 fL (ref 80.0–100.0)
Platelets: 275 10*3/uL (ref 150–400)
Platelets: 300 10*3/uL (ref 150–400)
RBC: 3.29 MIL/uL — ABNORMAL LOW (ref 3.87–5.11)
RBC: 3.58 MIL/uL — ABNORMAL LOW (ref 3.87–5.11)
RDW: 13.1 % (ref 11.5–15.5)
RDW: 13.2 % (ref 11.5–15.5)
WBC: 13.6 10*3/uL — ABNORMAL HIGH (ref 4.0–10.5)
WBC: 13 10*3/uL — ABNORMAL HIGH (ref 4.0–10.5)
nRBC: 0 % (ref 0.0–0.2)
nRBC: 0 % (ref 0.0–0.2)

## 2021-05-01 LAB — BASIC METABOLIC PANEL
Anion gap: 8 (ref 5–15)
BUN: 8 mg/dL (ref 8–23)
CO2: 27 mmol/L (ref 22–32)
Calcium: 8.3 mg/dL — ABNORMAL LOW (ref 8.9–10.3)
Chloride: 100 mmol/L (ref 98–111)
Creatinine, Ser: 0.88 mg/dL (ref 0.44–1.00)
GFR, Estimated: >60 mL/min (ref >60)
Glucose, Bld: 124 mg/dL — ABNORMAL HIGH (ref 70–99)
Potassium: 4 mmol/L (ref 3.5–5.1)
Sodium: 135 mmol/L (ref 135–145)

## 2021-05-01 LAB — DIFFERENTIAL
Abs Immature Granulocytes: 0.53 10*3/uL — ABNORMAL HIGH (ref 0.00–0.07)
Basophils Absolute: 0 10*3/uL (ref 0.0–0.1)
Basophils Relative: 0 %
Eosinophils Absolute: 0 10*3/uL (ref 0.0–0.5)
Eosinophils Relative: 0 %
Immature Granulocytes: 4 %
Lymphocytes Relative: 7 %
Lymphs Abs: 0.9 10*3/uL (ref 0.7–4.0)
Monocytes Absolute: 0.7 10*3/uL (ref 0.1–1.0)
Monocytes Relative: 5 %
Neutro Abs: 11.4 10*3/uL — ABNORMAL HIGH (ref 1.7–7.7)
Neutrophils Relative %: 84 %

## 2021-05-01 LAB — MAGNESIUM: Magnesium: 1.8 mg/dL (ref 1.7–2.4)

## 2021-05-01 LAB — GLUCOSE, CAPILLARY
Glucose-Capillary: 109 mg/dL — ABNORMAL HIGH (ref 70–99)
Glucose-Capillary: 127 mg/dL — ABNORMAL HIGH (ref 70–99)
Glucose-Capillary: 149 mg/dL — ABNORMAL HIGH (ref 70–99)
Glucose-Capillary: 151 mg/dL — ABNORMAL HIGH (ref 70–99)

## 2021-05-01 LAB — PHOSPHORUS: Phosphorus: 4.1 mg/dL (ref 2.5–4.6)

## 2021-05-01 LAB — TRIGLYCERIDES: Triglycerides: 57 mg/dL (ref <150)

## 2021-05-01 LAB — PREALBUMIN: Prealbumin: 5.5 mg/dL — ABNORMAL LOW (ref 18–38)

## 2021-05-01 MED ORDER — TRAVASOL 10 % IV SOLN
INTRAVENOUS | Status: AC
  Filled 2021-05-01: qty 528

## 2021-05-01 MED ORDER — DEXTROSE 10 % IV SOLN: INTRAVENOUS | Status: DC

## 2021-05-01 MED ORDER — DEXTROSE 10 % IV SOLN: INTRAVENOUS | Status: AC

## 2021-05-01 NOTE — Progress Notes (Signed)
1 Day Post-Op Procedure(s) (LRB): LAPAROSCOPY DIAGNOSTIC WITH BIOPSIES (N/A)  GYN Oncology Progress Note  Subjective: Patient reports abdominal soreness last pm related to gas but improvement this am. No nausea or emesis reported. She has not been out of bed today but states she will increase her mobility. No flatus or BM reported. No concerns voiced.      Objective: Vital signs in last 24 hours: Temp:  [97.3 F (36.3 C)-98.3 F (36.8 C)] 97.3 F (36.3 C) (08/09 0543) Pulse Rate:  [76-105] 76 (08/09 0543) Resp:  [16-31] 18 (08/09 0543) BP: (119-150)/(77-97) 119/82 (08/09 0543) SpO2:  [94 %-100 %] 94 % (08/09 0543) Weight:  [169 lb 12.1 oz (77 kg)-181 lb 14.1 oz (82.5 kg)] 181 lb 14.1 oz (82.5 kg) (08/09 0549) Last BM Date: 04/29/21  Intake/Output from previous day: 08/08 0701 - 08/09 0700 In: 1382.1 [I.V.:1332.1; IV Piggyback:50] Out: 1525 [Blood:25]  Physical Examination: General: alert, cooperative, and no distress Resp: clear to auscultation bilaterally Cardio: regular rate and rhythm, S1, S2 normal, no murmur, click, rub or gallop GI: soft, non-tender; bowel sounds normal; no masses,  no organomegaly and incision: lap sites to the abdomen with dermabond intact with no drainage Extremities: mild generalized edema bilaterally, non-pitting  Labs: WBC/Hgb/Hct/Plts:  13.6/10.1/30.2/275 (08/09 [id number]) BUN/Cr/glu/ALT/AST/amyl/lip:  8/0.86/--/9/13/--/-- (08/09 0520)  Assessment: 70 y.o. with peritoneal carcinomatosis and subacute bowel obstruction. She is s/p paracentesis with the cytology being nondiagnostic. IR felt there was not an area of significant disease to be biopsied. She then proceeded with surgery to obtain a biopsy with Dr. Berline Lopes. S/p Procedure(s): LAPAROSCOPY DIAGNOSTIC WITH BIOPSIES: stable. Path is pending.  Pain:  Pain is well-controlled on PRN medications.  Heme: Hgb 10.1 and Hct 30.2 this am. Appropriate compared with pre-op values and surgical losses.  ID:  WBC 13.6 this am. Pt received decadron in the OR. Continue to follow.  CV: BP and HR stable. Continue to monitor with routine vital sign assessments.  GI:  Tolerating po: NPO due to subacute bowel obstructions related to peritoneal carcinomatosis . Antiemetics ordered as needed. CT on 04/29/21: ascites, findings consistent with peritoneal carcinomatosis, pleural effusions.  GU: Voiding but no output documented.     FEN: No critical values. Pharmacy following labs due to TPN.  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 CBG (last 3)  Recent Labs    05/01/21 0058 05/01/21 0544 05/01/21 1157  GLUCAP 151* 149* 109*     Prophylaxis:  Lovenox restarted today . SCD wraps in the room attached to the bed but no machine in the room  Plan: TPN Awaiting pathology results from diagnostic laparoscopy with biopsies with Dr. Berline Lopes on 8/8 with plans for initiation of chemotherapy under the care of Dr. Alvy Bimler Continue plan of care per Hospitalist Team and Dr. Alvy Bimler   LOS: 10 days     Lenna Sciara D Camryn Quesinberry 05/01/2021, 1:20 PM

## 2021-05-01 NOTE — Progress Notes (Signed)
PROGRESS NOTE    Brenda Stewart  [id number] DOB: 1951-01-23 DOA: 04/21/2021 PCP: Pcp, No      Brief Narrative:  Brenda Stewart is a 70 y/o F with HTN, GERD who presented with abdominal pain and was found to have SBO due to peritoneal carcinomatosis.  General surgery and oncology consulted and following.  Patient transferred to [facility] to start chemotherapy.  Awaiting pathology prior to this.     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 -Oncology following and hoping to start patient on chemotherapy with carboplatin and paclitaxel however awaiting pathology -Per oncology.  Malignant ascites -Status post paracentesis. -Concern for reaccumulation of ascites. -Cytology unrevealing -Per oncology.   Acute renal failure -Secondary to prerenal azotemia.   -Resolved with hydration.    Prolonged QT interval -Repeat EKG with resolution of QT prolongation. -Keep potassium close to 4, magnesium close to 2. -Follow.  Severe protein calorie malnutrition/unintended weight loss -Likely secondary to malignancy -Patient currently n.p.o. secondary to problem #1. -TPN  started  Thrombocytosis -Resolved.  Hypomagnesemia -Repleted  Hyponatremia -trend  Iron deficiency anemia/folate deficiency -Anemia panel consistent with iron deficiency anemia and folate deficiency. -Folate level high at 5.0, iron at 18, ferritin on 415.  Vitamin B12 at 123456. -Continue folic acid 1 mg IV daily as patient NPO.   -Hemoglobin stable  -Transfusion threshold hemoglobin < 7.     DVT prophylaxis: Heparin>>>> Lovenox Code Status: Full Family Communication: Updated patient.  No family at bedside. Disposition:   Status is: Inpatient  Remains inpatient appropriate because:Inpatient level of care appropriate due to severity of illness  Dispo: The patient is from: Home              Anticipated d/c is to: Home              Patient currently is not medically stable to d/c.- needs biopsy and initiation of chemo-- at least 1 more week in the hospital    Difficult to place patient No       Consultants:  GS: Dr. Rosendo Gros 04/21/2020 Oncology: Dr. Lorna Few 04/22/2020 GYN oncology: Dr. Denman George 04/24/2020  Procedures:  CT abdomen pelvis 04/21/2021 Abdominal films 04/22/2021, 04/24/2021, 04/26/2021 Abdominal ultrasound 04/22/2021 Ultrasound-guided left lower quadrant paracentesis--2.1 L of peritoneal fluid removed, per IR, Dr.Mugweru  Right IJ PowerPort placed per IR, Dr. Annamaria Boots 04/26/2021 CT abdomen and pelvis 04/29/2021      Subjective: Tpn became unhooked last night   Objective: Vitals:   04/30/21 1800 04/30/21 1942 05/01/21 0543 05/01/21 0549  BP: 127/87 133/90 119/82   Pulse: 93 90 76   Resp: (!) '21 18 18   '$ Temp: 98.1 F (36.7 C) 98.3 F (36.8 C) (!) 97.3 F (36.3 C)   TempSrc:  Oral Oral   SpO2: 100% 97% 94%   Weight:  82.5 kg  Height:        Intake/Output Summary (Last 24 hours) at 05/01/2021 1149 Last data filed at 05/01/2021 0600 Gross per 24 hour  Intake 1382.14 ml  Output 1525 ml  Net -142.86 ml   Filed Weights   04/24/21 0546 04/30/21 1416  05/01/21 0549  Weight: 77 kg 77 kg 82.5 kg    Examination:  General: Appearance:     Overweight female in no acute distress     Lungs:      respirations unlabored  Heart:    Normal heart rate. Normal rhythm. No murmurs, rubs, or gallops.    MS:   All extremities are intact.    Neurologic:   Awake, alert, oriented x 3. No apparent focal neurological           defect.        Data Reviewed: I have personally reviewed following labs and imaging studies  CBC: Recent Labs  Lab 04/27/21 0453 04/28/21 0032 04/29/21 0500 04/30/21 0858 05/01/21 0520  WBC 9.3 9.4 11.8* 8.7 13.6*  NEUTROABS 7.1  --   --   --  11.4*  HGB 10.9* 10.7* 11.0* 10.1* 10.1*  HCT 33.0* 32.5* 33.4* 31.1* 30.2*  MCV 91.4 91.8 91.8 92.8 91.8  PLT 311 289 281 267 [id number]    Basic Metabolic Panel: Recent Labs  Lab 04/26/21 0914 04/27/21 0453 04/28/21 0032 04/29/21 0500 04/30/21 0858 05/01/21 0520  NA  --  136 134* 134* 136 137  K  --  3.6 4.0 3.8 3.5 4.7  CL  --  103 100 100 102 102  CO2  --  '27 24 25 25 '$ 21*  GLUCOSE  --  116* 94 108* 116* 160*  BUN  --  '10 9 9 '$ 7* 8  CREATININE  --  0.93 0.93 0.88 0.92 0.86  CALCIUM  --  8.6* 8.3* 8.3* 8.2* 8.5*  MG 1.5* 2.1 1.7 2.2  --  1.8  PHOS  --  3.3  --   --   --  4.1    GFR: Estimated Creatinine Clearance: 70.5 mL/min (by C-G formula based on SCr of 0.86 mg/dL).  Liver Function Tests: Recent Labs  Lab 04/27/21 0453 05/01/21 0520  AST 14* 13*  ALT 8 9  ALKPHOS 49 48  BILITOT 0.6 0.3  PROT 6.2* 6.2*  ALBUMIN 2.9* 2.6*    CBG: Recent Labs  Lab 04/28/21 0728 05/01/21 0058 05/01/21 0544  GLUCAP 92 151* 149*     Recent Results (from the past 240 hour(s))  Resp Panel by RT-PCR (Flu A&B, Covid) Nasopharyngeal Swab     Status: None   Collection Time: 04/21/21  6:49 PM   Specimen: Nasopharyngeal Swab; Nasopharyngeal(NP) swabs in vial transport medium  Result Value Ref Range Status   SARS Coronavirus 2 by RT PCR NEGATIVE NEGATIVE Final     Comment: (NOTE) SARS-CoV-2 target nucleic acids are NOT DETECTED.  The SARS-CoV-2 RNA is generally detectable in upper respiratory specimens during the acute phase of infection. The lowest concentration of SARS-CoV-2 viral copies this assay can detect is 138 copies/mL. A negative result does not preclude SARS-Cov-2 infection and should not be used as the sole basis for treatment or other patient management decisions. A negative result may occur with  improper specimen collection/handling, submission of specimen other than nasopharyngeal swab, presence of viral mutation(s) within the areas targeted by this assay, and inadequate number of viral copies(<138 copies/mL). A negative result must be combined with clinical observations,  patient history, and epidemiological information. The expected result is Negative.  Fact Sheet for Patients:  EntrepreneurPulse.com.au  Fact Sheet for Healthcare Providers:  IncredibleEmployment.be  This test is no t yet approved or cleared by the [place] FDA and  has been authorized for detection and/or diagnosis of SARS-CoV-2 by FDA under an Emergency Use Authorization (EUA). This EUA will remain  in effect (meaning this test can be used) for the duration of the COVID-19 declaration under Section 564(b)(1) of the Act, 21 U.S.C.section 360bbb-3(b)(1), unless the authorization is terminated  or revoked sooner.       Influenza A by PCR NEGATIVE NEGATIVE Final   Influenza B by PCR NEGATIVE NEGATIVE Final    Comment: (NOTE) The Xpert Xpress SARS-CoV-2/FLU/RSV plus assay is intended as an aid in the diagnosis of influenza from Nasopharyngeal swab specimens and should not be used as a sole basis for treatment. Nasal washings and aspirates are unacceptable for Xpert Xpress SARS-CoV-2/FLU/RSV testing.  Fact Sheet for Patients: EntrepreneurPulse.com.au  Fact Sheet for Healthcare  Providers: IncredibleEmployment.be  This test is not yet approved or cleared by the [place] FDA and has been authorized for detection and/or diagnosis of SARS-CoV-2 by FDA under an Emergency Use Authorization (EUA). This EUA will remain in effect (meaning this test can be used) for the duration of the COVID-19 declaration under Section 564(b)(1) of the Act, 21 U.S.C. section 360bbb-3(b)(1), unless the authorization is terminated or revoked.  Performed at [facility], [place] [address], [place], [place] [address]   Body fluid culture w Gram Stain     Status: None   Collection Time: 04/24/21  1:55 PM   Specimen: Abdomen; Peritoneal Fluid  Result Value Ref Range Status   Specimen Description PERITONEAL FLUID  Final   Special Requests ABDOMEN  Final   Gram Stain   Final    WBC PRESENT, PREDOMINANTLY MONONUCLEAR NO ORGANISMS SEEN CYTOSPIN SMEAR    Culture   Final    NO GROWTH 3 DAYS Performed at [facility], [place] [address], [place], [place] [address]    Report Status 04/27/2021 FINAL  Final          Radiology Studies: No results found.      Scheduled Meds:  enoxaparin (LOVENOX) injection  40 mg Subcutaneous Q24H   insulin aspart  0-9 Units Subcutaneous Q6H   pantoprazole (PROTONIX) IV  40 mg Intravenous Daily   sodium chloride flush  10-40 mL Intracatheter Q12H   Continuous Infusions:  dextrose 40 mL/hr at 05/01/21 0208   dextrose 5 % and 0.9% NaCl Stopped (0000000 [id number])   folic acid (FOLVITE) IVPB 1 mg (05/01/21 1029)   TPN ADULT (ION) Stopped (05/01/21 0200)   TPN ADULT (ION)       LOS: 10 days    Time spent: 35 minutes    Geradine Girt, DO [facility]   To contact the attending provider between 7A-7P or the covering provider during after hours 7P-7A, please log into the web site [email or web address] and access using universal [place] password for that web site. If you do not have the password, please call the  hospital operator.  05/01/2021, 11:49 AM

## 2021-05-01 NOTE — Progress Notes (Addendum)
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 Berline Lopes and she has arranged for a diagnostic laparoscopy and biopsy which was performed on 04/30/2021, results pending She had port placement In the meantime, I recommend continue supportive care Chemo is placed on hold until further results are available, likely Thursday at the earliest   Malignant ascites She will likely have reaccumulation of ascites with aggressive IV fluid hydration Monitor closely   Uncontrolled nausea and vomiting, stable Due to abdominal carcinomatosis This is not likely going to get better without treatment Continue antiemetics as needed For now, she does not need NG tube  Malnutrition due to n.p.o. status TPN was initiated on 04/30/2021  Acute renal failure, resolved Due to dehydration and carcinomatosis It is improving Recommend gentle fluid hydration only   Constipation, resolved Continue conservative approach I am concerned about risk of bowel perforation with laxative Monitor closely   Code Status Full code   Goals of care Resolution of bowel obstruction, ability to tolerate oral intake and resolution of nausea   Discharge planning I anticipate she will be in the hospital 5 to 7 days I am not here to round on her  tomorrow but will return on Thursday morning  All questions were answered. The patient knows to call the clinic with any problems, questions or concerns.   [name], NP 05/01/2021 9:36 AM Heath Lark, MD  Subjective:  The patient underwent biopsy yesterday and tolerated the procedure well She feels well this morning overall Denies pain No bowel movement yet today Denies nausea and vomiting TPN was hung last night, but tubing was found lying on the floor around 2 AM and TPN had to be discontinued She now has D10W running  Objective:  Vitals:   04/30/21 1942 05/01/21 0543  BP: 133/90 119/82  Pulse: 90 76  Resp: 18 18  Temp: 98.3 F (36.8 C) (!) 97.3 F (36.3 C)  SpO2: 97% 94%     Intake/Output Summary (Last 24 hours) at 05/01/2021 0936 Last data filed at 05/01/2021 0600 Gross per 24 hour  Intake 1382.14 ml  Output 1525 ml  Net -142.86 ml    GENERAL:alert, no distress and comfortable NEURO: alert & oriented x 3 with fluent speech, no focal motor/sensory deficits   Labs:  Recent Labs    05/20/20 1032 04/21/21 1413 04/22/21 0509 04/27/21 0453 04/28/21 0032 04/29/21 0500 04/30/21 0858  NA 142 132*   < > 136 134* 134* 136  K 3.5 3.8   < > 3.6 4.0 3.8 3.5  CL 104 94*   < > 103 100 100 102  CO2 28 23   < > '27 24 25 25  '$ GLUCOSE 113* 110*   < > 116* 94 108* 116*  BUN 10 17   < > '10 9 9 '$ 7*  CREATININE 1.03* 2.57*   < >  0.93 0.93 0.88 0.92  CALCIUM 9.4 9.5   < > 8.6* 8.3* 8.3* 8.2*  GFRNONAA 56* 20*   < > >60 >60 >60 >60  GFRAA >60  --   --   --   --   --   --   PROT 7.9 8.1  --  6.2*  --   --   --   ALBUMIN 4.1 3.5  --  2.9*  --   --   --   AST 20 12*  --  14*  --   --   --   ALT 14 10  --  8  --   --   --   ALKPHOS 73 77  --  49  --   --   --   BILITOT 0.6 0.7  --  0.6  --   --   --    < > = values in this interval not displayed.    Studies:  CT ABDOMEN PELVIS WO CONTRAST  Result Date: 04/21/2021 CLINICAL DATA:  Nausea and vomiting for 2 days. Lower  abdominal pain for approximately 3 months. Chronic constipation. EXAM: CT ABDOMEN AND PELVIS WITHOUT CONTRAST TECHNIQUE: Multidetector CT imaging of the abdomen and pelvis was performed following the standard protocol without IV contrast. COMPARISON:  None. FINDINGS: Lower chest: No acute findings. Hepatobiliary: No mass visualized on this unenhanced exam. Gallbladder is unremarkable. No evidence of biliary ductal dilatation. Pancreas: No mass or inflammatory process visualized on this unenhanced exam. Spleen:  Within normal limits in size. Adrenals/Urinary tract: No evidence of urolithiasis or hydronephrosis. Unremarkable unopacified urinary bladder. Stomach/Bowel: Small hiatal hernia is seen containing ascites. Moderate diffuse small bowel dilatation is seen, with suspected transition point in the right lower quadrant, suspicious for distal small bowel obstruction. Vascular/Lymphatic: No pathologically enlarged lymph nodes identified. No evidence of abdominal aortic aneurysm. Aortic atherosclerotic calcification noted. Reproductive: Prior hysterectomy suspected. Moderate ascites is seen. Omental caking and diffuse mesenteric soft tissue stranding is seen. There is also probable peritoneal nodularity along the left pelvic sidewall. These findings are highly suspicious for peritoneal carcinomatosis. Other:  None. Musculoskeletal:  No suspicious bone lesions identified. 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 Suspected distal small bowel obstruction, with probable transition point in right lower quadrant. Aortic Atherosclerosis (ICD10-I70.0). Electronically Signed   By: Marlaine Hind M.D.   On: 04/21/2021 18:36   CT ABDOMEN PELVIS W CONTRAST  Result Date: 04/29/2021 CLINICAL DATA:  70 year old female for staging of peritoneal carcinomatosis prior to chemotherapy. EXAM: CT ABDOMEN AND PELVIS WITH CONTRAST TECHNIQUE: Multidetector CT imaging of the abdomen and pelvis was  performed using the standard protocol following bolus administration of intravenous contrast. CONTRAST:  41m OMNIPAQUE IOHEXOL 350 MG/ML SOLN COMPARISON:  04/21/2021 noncontrast study. FINDINGS: Lower chest: A small LEFT pleural effusion with LEFT LOWER lung atelectasis is now noted. A trace RIGHT pleural effusion with mild RIGHT basilar atelectasis is identified. Hepatobiliary: Small hypodense lesions along the INFERIOR RIGHT liver are compatible with subserosal implants. No other hepatic abnormalities are present. The gallbladder appears mildly thick walled. No biliary dilatation. Pancreas: Unremarkable Spleen: Unremarkable Adrenals/Urinary Tract: The kidneys, adrenal glands and bladder are unremarkable. Stomach/Bowel: No dilated bowel loops are noted. No definite focal bowel wall thickening is identified. Vascular/Lymphatic: Aortic atherosclerosis. No enlarged abdominal or pelvic lymph nodes. Reproductive: Patient is status post hysterectomy. No definite adnexal masses are noted. Other: Moderate ascites within the anterior/mid abdomen extending into the pelvis are noted. There is mild peritoneal thickening/minimal  peritoneal irregularity noted in several areas. There are smaller focal fluid/cystic areas within the RIGHT abdomen and LOWER pelvis. Diffuse omental thickening/caking is noted. Mild stranding within the mesentery is also present. No evidence of pneumoperitoneum. Musculoskeletal: No acute or suspicious bony abnormalities are identified. IMPRESSION: 1. Moderate ascites, mild peritoneal thickening and diffuse omental thickening/caking and smaller focal fluid/cystic areas within the RIGHT abdomen and LOWER pelvis, compatible with peritoneal carcinomatosis. Mild mesenteric stranding is nonspecific. 2. Small subserosal implants along the INFERIOR RIGHT liver. 3. Small LEFT pleural effusion and trace RIGHT pleural effusion with LEFT LOWER lung atelectasis. 4. Aortic Atherosclerosis (ICD10-I70.0).  Electronically Signed   By: Margarette Canada M.D.   On: 04/29/2021 12:16   US Abdomen Limited  Result Date: 04/22/2021 CLINICAL DATA:  Peritoneal neoplasm abdominal distension, recurrent ascites EXAM: LIMITED ABDOMEN ULTRASOUND FOR ASCITES TECHNIQUE: Limited ultrasound survey for ascites was performed in all four abdominal quadrants. COMPARISON:  04/21/2021 CT FINDINGS: Survey of the abdominal 4 quadrants demonstrates a small volume of lower abdominal ascites by ultrasound. Unable to perform paracentesis because of intractable nausea vomiting today. IMPRESSION: Small volume of lower abdominopelvic ascites. Will reassess for paracentesis tomorrow 04/23/2021. Electronically Signed   By: Jerilynn Mages.  Shick M.D.   On: 04/22/2021 12:09   DG Abd 2 Views  Result Date: 04/28/2021 CLINICAL DATA:  Small bowel obstruction. EXAM: ABDOMEN - 2 VIEW COMPARISON:  04/26/2021 and earlier FINDINGS: There is focal atelectasis or. No free intraperitoneal consolidation MEDIAL LEFT LOWER lobe air. Persistent mild dilatation of small bowel loops in the LEFT central abdomen, similar in appearance to previous CT exam. Air is identified within nondilated loops of colon. There are degenerative changes in both hips. IMPRESSION: 1. Persistent mild dilatation of small bowel loops in the LEFT central abdomen. 2. LEFT LOWER lobe atelectasis or consolidation. Electronically Signed   By: Nolon Nations M.D.   On: 04/28/2021 10:13   DG Abd 2 Views  Result Date: 04/26/2021 CLINICAL DATA:  Follow-up small bowel obstruction. EXAM: ABDOMEN - 2 VIEW COMPARISON:  A2 22 FINDINGS: There is mild residual gaseous distension of multiple small bowel loops, decreased from prior. A small amount of gas is scattered in the colon and rectum. No intraperitoneal free air is identified. Atelectasis is noted in the left lung base. IMPRESSION: Decreased small bowel distension compatible with improving obstruction. Electronically Signed   By: Logan Bores M.D.   On:  04/26/2021 12:45   DG Abd Portable 1V  Result Date: 04/24/2021 CLINICAL DATA:  Small-bowel obstruction. EXAM: PORTABLE ABDOMEN - 1 VIEW COMPARISON:  04/22/2021.  CT 04/21/2021. FINDINGS: Mild-to-moderately distended loops of small bowel with a relative paucity of colonic gas again noted. Findings again consistent with small bowel obstruction. No free air. Ascites most likely present. Pelvic calcifications consistent phleboliths. Degenerative changes lumbar spine and both hips. IMPRESSION: Mild-to-moderate small-bowel distention with a paucity of intra colonic air again noted. Findings again consistent with small bowel obstruction. Ascites most likely present. Electronically Signed   By: Marcello Moores  Register   On: 04/24/2021 11:08   DG Abd Portable 1V-Small Bowel Obstruction Protocol-initial, 8 hr delay  Result Date: 04/22/2021 CLINICAL DATA:  Small bowel obstruction, 8 hour film. EXAM: PORTABLE ABDOMEN - 1 VIEW COMPARISON:  CT yesterday. FINDINGS: Administered enteric contrast has reached the ascending and proximal transverse colon. There is enteric contrast within prominent small bowel in the central abdomen. Left upper quadrant contrast may be residual within small bowel or at the splenic flexure. No evidence of free air.  IMPRESSION: Administered enteric contrast has reached the ascending and proximal transverse colon. There is enteric contrast within prominent small bowel in the central abdomen. Left upper quadrant contrast may be residual within small bowel or at the splenic flexure. Electronically Signed   By: Keith Rake M.D.   On: 04/22/2021 17:38   IR IMAGING GUIDED PORT INSERTION  Result Date: 04/26/2021 CLINICAL DATA:  Peritoneal carcinomatosis, unknown primary, access for chemotherapy EXAM: RIGHT INTERNAL JUGULAR SINGLE LUMEN POWER PORT CATHETER INSERTION Date:  04/26/2021 04/26/2021 2:15 pm Radiologist:  M. Daryll Brod, MD Guidance:  Ultrasound and fluoroscopic MEDICATIONS: 1% lidocaine with  epinephrine local ANESTHESIA/SEDATION: Versed 2.0 mg IV; Fentanyl 100 mcg IV; Moderate Sedation Time:  25 minutes The patient was continuously monitored during the procedure by the interventional radiology nurse under my direct supervision. FLUOROSCOPY TIME:  One minutes, 0 seconds (5 mGy) COMPLICATIONS: None immediate. CONTRAST:  None. PROCEDURE: Informed consent was obtained from the patient following explanation of the procedure, risks, benefits and alternatives. The patient understands, agrees and consents for the procedure. All questions were addressed. A time out was performed. Maximal barrier sterile technique utilized including caps, mask, sterile gowns, sterile gloves, large sterile drape, hand hygiene, and 2% chlorhexidine scrub. Under sterile conditions and local anesthesia, right internal jugular micropuncture venous access was performed. Access was performed with ultrasound. Images were obtained for documentation of the patent right internal jugular vein. A guide wire was inserted followed by a transitional dilator. This allowed insertion of a guide wire and catheter into the IVC. Measurements were obtained from the SVC / RA junction back to the right IJ venotomy site. In the right infraclavicular chest, a subcutaneous pocket was created over the second anterior rib. This was done under sterile conditions and local anesthesia. 1% lidocaine with epinephrine was utilized for this. A 2.5 cm incision was made in the skin. Blunt dissection was performed to create a subcutaneous pocket over the right pectoralis major muscle. The pocket was flushed with saline vigorously. There was adequate hemostasis. The port catheter was assembled and checked for leakage. The port catheter was secured in the pocket with two retention sutures. The tubing was tunneled subcutaneously to the right venotomy site and inserted into the SVC/RA junction through a valved peel-away sheath. Position was confirmed with fluoroscopy.  Images were obtained for documentation. The patient tolerated the procedure well. No immediate complications. Incisions were closed in a two layer fashion with 4 - 0 Vicryl suture. Dermabond was applied to the skin. The port catheter was accessed, blood was aspirated followed by saline and heparin flushes. Needle was removed. A dry sterile dressing was applied. IMPRESSION: Ultrasound and fluoroscopically guided right internal jugular single lumen power port catheter insertion. Tip in the SVC/RA junction. Catheter ready for use. Electronically Signed   By: Jerilynn Mages.  Shick M.D.   On: 04/26/2021 14:22   IR Paracentesis  Result Date: 04/24/2021 INDICATION: Abdominal distention secondary to ascites. Underlying concern for peritoneal carcinomatosis on imaging findings. Request for diagnostic and therapeutic paracentesis. EXAM: ULTRASOUND GUIDED LEFT LOWER QUADRANT PARACENTESIS MEDICATIONS: 1% plain lidocaine, 5 mL COMPLICATIONS: None immediate. PROCEDURE: Informed written consent was obtained from the patient after a discussion of the risks, benefits and alternatives to treatment. A timeout was performed prior to the initiation of the procedure. Initial ultrasound scanning demonstrates a large amount of ascites within the left lower abdominal quadrant. The left lower abdomen was prepped and draped in the usual sterile fashion. 1% lidocaine was used for local anesthesia. Following  this, a 19 gauge, 7-cm, Yueh catheter was introduced. An ultrasound image was saved for documentation purposes. The paracentesis was performed. The catheter was removed and a dressing was applied. The patient tolerated the procedure well without immediate post procedural complication. FINDINGS: A total of approximately 2.1 L of clear yellow fluid was removed. Samples were sent to the laboratory as requested by the clinical team. IMPRESSION: Successful ultrasound-guided paracentesis yielding 2.1 liters of peritoneal fluid. Read by: Ascencion Dike PA-C  Electronically Signed   By: Michaelle Birks MD   On: 04/24/2021 12:38

## 2021-05-01 NOTE — Progress Notes (Signed)
Received report that TPN tubing was found disconnected and lying on the floor. Port assessed; pharmacy contacted by RN for D10W dosing per order.

## 2021-05-01 NOTE — Progress Notes (Signed)
PHARMACY - TOTAL PARENTERAL NUTRITION CONSULT NOTE   Indication: Gyn malignancy, unable to take po  Patient Measurements: Height: '5\' 8"'$  (172.7 cm) Weight: 82.5 kg (181 lb 14.1 oz) IBW/kg (Calculated) : 63.9 TPN AdjBW (KG): 77 Body mass index is 27.65 kg/m. TBW 77 kg  Assessment: 45 yoF with peritoneal carcinomatosis, primary prob ovarian/fallopian, unable to obtain perc biopsy with CT > need diag lap with biopsy scheduled for 8/8, planned TPN after surgery - but ordered 8/8. Anticipate ascites will accumulate. Chemotherapy planned after biopsy results  Glucose / Insulin: 3 units from start of TPN 8/8, note TPN stopped ~ 2am, continued with D10W at 40 ml/hr Dexamethasone '8mg'$  x1 on 8/8 Electrolytes: wnl Renal: wnl Hepatic: wnl on admit, TG 57, PreAlb 5.5 Intake / Output; MIVF: UOP not measured, Current IV fluid D5NS at 20 ml/hr GI Imaging: 8/7 Abd/Pelvic CT with contrast: peritoneal carcinomatosis, moderate ascites, diffuse omental caking/cystic areas - none amenable to percutaneous biopsy. GI Surgeries / Procedures: Diagnostic Lap/biopsy 8/8  Central access: Has PAC placed 8/4, ? placement of PICC for TPN, need PAC for chemo? TPN start date: plan 8/8  Nutritional Goals (per RD recommendation on 8/8) kCal: 2100-2300, Protein: 100-115gm , Fluid: > 2L per day  Goal TPN rate 80 mL/hr (provides 106 g of protein and  2050 kcals per day) Starting at 40 ml/hr - delivers 53 g protein, 970 kCals  Current Nutrition:  NPO  8/9 early am: TPN tubing disconnected, stopped TPN, start D10W at 47m/hr till next TPN bag 1800  Plan:   At 1800:  Discontinue D10W Resume TPN at 40 mL/hr  Electrolytes in TPN: Na 517m/L, K decr to 4063mL, Ca 5mE60m, Mg 5mEq68m and Phos decr to 12mmo28m Cl:Ac 1:1 Add standard MVI and trace elements to TPN Initiate Sensitive q6h SSI and adjust as needed  MIVF at 20 mL/hr  Monitor TPN labs on Mon/Thurs, Labs in am 8/10  Yarithza Mink,Minda DittoD 05/01/2021,10:03  AM

## 2021-05-02 LAB — GLUCOSE, CAPILLARY
Glucose-Capillary: 125 mg/dL — ABNORMAL HIGH (ref 70–99)
Glucose-Capillary: 130 mg/dL — ABNORMAL HIGH (ref 70–99)
Glucose-Capillary: 124 mg/dL — ABNORMAL HIGH (ref 70–99)
Glucose-Capillary: 105 mg/dL — ABNORMAL HIGH (ref 70–99)

## 2021-05-02 LAB — BASIC METABOLIC PANEL
Anion gap: 8 (ref 5–15)
BUN: 8 mg/dL (ref 8–23)
CO2: 26 mmol/L (ref 22–32)
Calcium: 8.4 mg/dL — ABNORMAL LOW (ref 8.9–10.3)
Chloride: 104 mmol/L (ref 98–111)
Creatinine, Ser: 0.85 mg/dL (ref 0.44–1.00)
GFR, Estimated: >60 mL/min (ref >60)
Glucose, Bld: 115 mg/dL — ABNORMAL HIGH (ref 70–99)
Potassium: 4 mmol/L (ref 3.5–5.1)
Sodium: 138 mmol/L (ref 135–145)

## 2021-05-02 LAB — PHOSPHORUS: Phosphorus: 3.3 mg/dL (ref 2.5–4.6)

## 2021-05-02 LAB — MAGNESIUM: Magnesium: 1.7 mg/dL (ref 1.7–2.4)

## 2021-05-02 MED ORDER — MAGNESIUM SULFATE 2 GM/50ML IV SOLN
2.0000 g | Freq: Once | INTRAVENOUS | Status: AC
  Administered 2021-05-02: 2 g via INTRAVENOUS
  Filled 2021-05-02: qty 50

## 2021-05-02 MED ORDER — DEXTROSE 70 % IV SOLN
INTRAVENOUS | Status: AC
  Filled 2021-05-02: qty 792

## 2021-05-02 MED ORDER — CHLORHEXIDINE GLUCONATE CLOTH 2 % EX PADS
6.0000 | MEDICATED_PAD | Freq: Every day | CUTANEOUS | Status: DC
  Administered 2021-05-02 – 2021-05-09 (×8): 6 via TOPICAL

## 2021-05-02 NOTE — Progress Notes (Signed)
PROGRESS NOTE    Brenda Stewart  [id number] DOB: [birth year] DOA: 04/21/2021 PCP: Pcp, No      Brief Narrative:  Brenda Stewart is a 70 y/o F with HTN, GERD who presented with abdominal pain and was found to have SBO due to peritoneal carcinomatosis.  General surgery and oncology consulted and following.  Patient transferred to [facility] long hospital to start chemotherapy.  Awaiting pathology prior to this.     Assessment & Plan:   Principal Problem:   SBO (small bowel obstruction) (HCC) Active Problems:   AKI (acute kidney injury) ([place])   Prolonged QT interval   Unintended weight loss   Nausea & vomiting   Hyponatremia   Neoplasm of peritoneum   Leukocytosis   Ascites   Thrombocytosis   Protein-calorie malnutrition, severe   Peritoneal carcinomatosis (HCC)   Hypokalemia   Carcinomatosis (HCC)   small bowel obstruction -Likely secondary to a gyn malignancy -Biopsy done 8/8 - TPN started 8/8  Peritoneal carcinomatosis/elevated CA125/concern for ovarian or fallopian tube cancer with biopsies pending -Pathology report pending -Status post paracentesis- cytology unrevealing -Patient status post port placement 04/26/2021. -Oncology following and hoping to start patient on chemotherapy with carboplatin and paclitaxel however awaiting pathology -Per oncology.  Malignant ascites -Status post paracentesis. -Concern for reaccumulation of ascites. -Cytology unrevealing -Per oncology.   Acute renal failure -Secondary to prerenal azotemia.   -Resolved with hydration.    Prolonged QT interval -Repeat EKG with resolution of QT prolongation. -Keep potassium close to 4, magnesium close to 2. -Follow.  Severe protein calorie malnutrition/unintended weight loss -Likely secondary to malignancy -Patient currently n.p.o. secondary to problem #1. -TPN started  Thrombocytosis -Resolved.  Hypomagnesemia -Repleted  Hyponatremia -trend  Iron deficiency anemia/folate  deficiency -Anemia panel consistent with iron deficiency anemia and folate deficiency. -Folate level high at 5.0, iron at 18, ferritin on 415.  Vitamin B12 at 123456. -Continue folic acid 1 mg IV daily as patient NPO.   -Hemoglobin stable  -Transfusion threshold hemoglobin < 7.     DVT prophylaxis: Heparin>>>> Lovenox Code Status: Full Family Communication: Updated patient.  No family at bedside. Disposition:   Status is: Inpatient  Remains inpatient appropriate because:Inpatient level of care appropriate due to severity of illness  Dispo: The patient is from: Home              Anticipated d/c is to: Home              Patient currently is not medically stable to d/c.-  initiation of chemo-- at least 1 more week in the hospital    Difficult to place patient No       Consultants:  GS: Dr. Rosendo Gros 04/21/2020 Oncology: Dr. Lorna Few 04/22/2020 GYN oncology: Dr. Denman George 04/24/2020  Procedures:  CT abdomen pelvis 04/21/2021 Abdominal films 04/22/2021, 04/24/2021, 04/26/2021 Abdominal ultrasound 04/22/2021 Ultrasound-guided left lower quadrant paracentesis--2.1 L of peritoneal fluid removed, per IR, Dr.Mugweru  Right IJ PowerPort placed per IR, Dr. Annamaria Boots 04/26/2021 CT abdomen and pelvis 04/29/2021      Subjective: No overnight events   Objective: Vitals:   05/01/21 0549 05/01/21 1526 05/01/21 2036 05/02/21 0605  BP:  109/71 124/80 117/80  Pulse:  89 90 91  Resp:  '16 18 19  '$ Temp:  98 F (36.7 C) 97.9 F (36.6 C) 98.2 F (36.8 C)  TempSrc:  Oral Oral Oral  SpO2:  98% 99% 97%  Weight: 82.5 kg     Height:  Intake/Output Summary (Last 24 hours) at 05/02/2021 0800 Last data filed at 05/02/2021 0744 Gross per 24 hour  Intake 636.7 ml  Output --  Net 636.7 ml   Filed Weights   04/24/21 0546 04/30/21 1416 05/01/21 0549  Weight: 77 kg 77 kg 82.5 kg    Examination:   General: Appearance:     Overweight female in no acute distress     Lungs:     respirations  unlabored  Heart:    Normal heart rate.    MS:   All extremities are intact.    Neurologic:   Awake, alert         Data Reviewed: I have personally reviewed following labs and imaging studies  CBC: Recent Labs  Lab 04/27/21 0453 04/28/21 0032 04/29/21 0500 04/30/21 0858 05/01/21 0520 05/01/21 2050  WBC 9.3 9.4 11.8* 8.7 13.6* 13.0*  NEUTROABS 7.1  --   --   --  11.4*  --   HGB 10.9* 10.7* 11.0* 10.1* 10.1* 10.8*  HCT 33.0* 32.5* 33.4* 31.1* 30.2* 33.6*  MCV 91.4 91.8 91.8 92.8 91.8 93.9  PLT 311 289 281 267 275 [id number]    Basic Metabolic Panel: Recent Labs  Lab 04/27/21 0453 04/28/21 0032 04/29/21 0500 04/30/21 0858 05/01/21 0520 05/01/21 2050 05/02/21 0536  NA 136 134* 134* 136 137 135 138  K 3.6 4.0 3.8 3.5 4.7 4.0 4.0  CL 103 100 100 102 102 100 104  CO2 '27 24 25 25 '$ 21* 27 26  GLUCOSE 116* 94 108* 116* 160* 124* 115*  BUN '10 9 9 '$ 7* '8 8 8  '$ CREATININE 0.93 0.93 0.88 0.92 0.86 0.88 0.85  CALCIUM 8.6* 8.3* 8.3* 8.2* 8.5* 8.3* 8.4*  MG 2.1 1.7 2.2  --  1.8  --  1.7  PHOS 3.3  --   --   --  4.1  --  3.3    GFR: Estimated Creatinine Clearance: 71.3 mL/min (by C-G formula based on SCr of 0.85 mg/dL).  Liver Function Tests: Recent Labs  Lab 04/27/21 0453 05/01/21 0520  AST 14* 13*  ALT 8 9  ALKPHOS 49 48  BILITOT 0.6 0.3  PROT 6.2* 6.2*  ALBUMIN 2.9* 2.6*    CBG: Recent Labs  Lab 05/01/21 0544 05/01/21 1157 05/01/21 1823 05/02/21 0006 05/02/21 0606  GLUCAP 149* 109* 127* 125* 130*     Recent Results (from the past 240 hour(s))  Body fluid culture w Gram Stain     Status: None   Collection Time: 04/24/21  1:55 PM   Specimen: Abdomen; Peritoneal Fluid  Result Value Ref Range Status   Specimen Description PERITONEAL FLUID  Final   Special Requests ABDOMEN  Final   Gram Stain   Final    WBC PRESENT, PREDOMINANTLY MONONUCLEAR NO ORGANISMS SEEN CYTOSPIN SMEAR    Culture   Final    NO GROWTH 3 DAYS Performed at [facility], [place] [address] [place], [place] [address]    Report Status 04/27/2021 FINAL  Final          Radiology Studies: No results found.      Scheduled Meds:  Chlorhexidine Gluconate Cloth  6 each Topical Daily   enoxaparin (LOVENOX) injection  40 mg Subcutaneous Q24H   insulin aspart  0-9 Units Subcutaneous Q6H   pantoprazole (PROTONIX) IV  40 mg Intravenous Daily   sodium chloride flush  10-40 mL Intracatheter Q12H   Continuous Infusions:  dextrose 5 % and 0.9% NaCl Stopped (05/01/21  123456)   folic acid (FOLVITE) IVPB Stopped (05/01/21 1059)   magnesium sulfate bolus IVPB     TPN ADULT (ION) 40 mL/hr at 05/02/21 0744     LOS: 11 days    Time spent: 35 minutes    Geradine Girt, DO [facility]   To contact the attending provider between 7A-7P or the covering provider during after hours 7P-7A, please log into the web site [email or web address] and access using universal [place] password for that web site. If you do not have the password, please call the hospital operator.  05/02/2021, 8:00 AM

## 2021-05-02 NOTE — Progress Notes (Signed)
2 Days Post-Op Procedure(s) (LRB): LAPAROSCOPY DIAGNOSTIC WITH BIOPSIES (N/A)  GYN Oncology Progress Note  Subjective: Patient reports mild incisional soreness this am. No nausea or emesis reported. She states she had a watery bowel movement. No flatus reported. Ambulated yesterday without difficulty. Denies chest pain, dyspnea. No concerns voiced.  Objective: Vital signs in last 24 hours: Temp:  [97.9 F (36.6 C)-98.2 F (36.8 C)] 98.2 F (36.8 C) (08/10 0605) Pulse Rate:  [89-91] 91 (08/10 0605) Resp:  [16-19] 19 (08/10 0605) BP: (109-124)/(71-80) 117/80 (08/10 0605) SpO2:  [97 %-99 %] 97 % (08/10 0605) Last BM Date: 04/30/21  Intake/Output from previous day: 08/09 0701 - 08/10 0700 In: 454.8 [I.V.:404.8; IV Piggyback:50] Out: -   Physical Examination: General: alert, cooperative, and no distress Resp: clear to auscultation bilaterally Cardio: regular rate and rhythm, S1, S2 normal, no murmur, click, rub or gallop GI: incision: lap sites to the abdomen with dermabond intact with no drainage and abdomen soft, non-distended, hypoactive bowel sounds Extremities: mild generalized edema bilaterally, non-pitting  Labs: WBC/Hgb/Hct/Plts:  13.0/10.8/33.6/300 (08/09 2050) BUN/Cr/glu/ALT/AST/amyl/lip:  8/0.85/--/--/--/--/-- (08/10 0536)  Assessment: 70 y.o. with peritoneal carcinomatosis and subacute bowel obstruction. She is s/p paracentesis with the cytology being nondiagnostic. IR felt there was not an area of significant disease to be biopsied. She then proceeded with surgery to obtain a biopsy with Dr. Berline Lopes. S/p Procedure(s): LAPAROSCOPY DIAGNOSTIC WITH BIOPSIES: stable. Path is pending.  Pain:  Pain is well-controlled on PRN medications.  Heme: Hgb 10.8 and Hct 33.6 this am. Appropriate compared with pre-op values and surgical losses.  ID: WBC 13.0 this am. Pt received decadron in the OR. Continue to follow.  CV: BP and HR stable. Continue to monitor with routine vital  sign assessments.  GI:  Tolerating po: NPO due to subacute bowel obstructions related to peritoneal carcinomatosis . Antiemetics ordered as needed. CT on 04/29/21: ascites, findings consistent with peritoneal carcinomatosis, pleural effusions. TPN running and management per pharmacy.  GU: Voiding. Creatinine 0.85.  FEN: No critical values. Pharmacy following labs due to TPN.  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 CBG (last 3)  Recent Labs    05/01/21 1823 05/02/21 0006 05/02/21 0606  GLUCAP 127* 125* 130*     Prophylaxis: Lovenox. SCD wraps in the room attached to the bed but no machine in the room  Plan: Awaiting pathology results from diagnostic laparoscopy with biopsies with Dr. Berline Lopes on 8/8 with plans for initiation of chemotherapy under the care of Dr. Alvy Bimler. Continue with TPN Continue plan of care per Hospitalist Team and Dr. Alvy Bimler   LOS: 11 days     Brenda Stewart 05/02/2021, 9:50 AM

## 2021-05-02 NOTE — Progress Notes (Signed)
PHARMACY - TOTAL PARENTERAL NUTRITION CONSULT NOTE   Indication: Gyn malignancy, subacute bowel obstruction, NPO  Patient Measurements: Height: '5\' 8"'$  (172.7 cm) Weight: 82.5 kg (181 lb 14.1 oz) IBW/kg (Calculated) : 63.9 TPN AdjBW (KG): 77 Body mass index is 27.65 kg/m. TBW 77 kg  Assessment: 46 yoF with peritoneal carcinomatosis, primary prob ovarian/fallopian, unable to obtain perc biopsy with CT > need diag lap with biopsy scheduled for 8/8, planned TPN after surgery - but ordered 8/8. Anticipate ascites will accumulate. Chemotherapy planned after biopsy results  Glucose / Insulin: 3 units from resumption of TPN 8/9 at 1800, (TPN off 8/9 at 2am) Dexamethasone '8mg'$  x1 on 8/8 for surgery CBGs 124-130, requiring little coverage, but just beginning TPN Electrolytes: wnl, Mg at low end normal range Renal: wnl Hepatic: wnl on admit, TG 57, PreAlb 5.5 Intake / Output; MIVF: UOP not measured, MIVF not hanging, order still active GI Imaging: 8/7 Abd/Pelvic CT with contrast: peritoneal carcinomatosis, moderate ascites, diffuse omental caking/cystic areas - none amenable to percutaneous biopsy. GI Surgeries / Procedures: Diagnostic Lap with biopsy 8/8  Central access: Has PAC placed 8/4, ? placement of PICC for TPN, need PAC for chemo? TPN start date: plan 8/8  Nutritional Goals (per RD recommendation on 8/8) kCal: 2100-2300, Protein: 100-115gm , Fluid: > 2L per day  Goal TPN rate 80 mL/hr (provides 106 g of protein and  2050 kcals per day) TPN at 60 ml/hr - delivers 79 g protein, 1530 kCals  Current Nutrition:  NPO, TPN 8/9 early am: TPN tubing disconnected, stopped TPN, started D10W at 76m/hr till next TPN bag 1800 8/9  Plan:  This am: Mg Sulfate 2gm IVPB x1  At 1800:  Increase TPN to 60 mL/hr  Electrolytes in TPN: Na 550m/L, K back to standard 5082mL, Ca 5mE51m, Mg 5mEq74m and Phos back to standard 15mmo72m Cl:Ac 1:1 Add standard MVI and trace elements to TPN Add Folic Acid  '1mg'$  to TPN, can discontinue IVPB order Initiate Sensitive q6h SSI and adjust as needed  Monitor TPN labs on Mon/Thurs  Shlomo Seres,Minda DittoD 05/02/2021,7:13 AM

## 2021-05-03 DIAGNOSIS — K56609 Unspecified intestinal obstruction, unspecified as to partial versus complete obstruction: Secondary | ICD-10-CM | POA: Diagnosis not present

## 2021-05-03 LAB — COMPREHENSIVE METABOLIC PANEL
ALT: 7 U/L (ref 0–44)
AST: 13 U/L — ABNORMAL LOW (ref 15–41)
Albumin: 2.5 g/dL — ABNORMAL LOW (ref 3.5–5.0)
Alkaline Phosphatase: 50 U/L (ref 38–126)
Anion gap: 10 (ref 5–15)
BUN: 12 mg/dL (ref 8–23)
CO2: 24 mmol/L (ref 22–32)
Calcium: 8.4 mg/dL — ABNORMAL LOW (ref 8.9–10.3)
Chloride: 102 mmol/L (ref 98–111)
Creatinine, Ser: 0.88 mg/dL (ref 0.44–1.00)
GFR, Estimated: >60 mL/min (ref >60)
Glucose, Bld: 110 mg/dL — ABNORMAL HIGH (ref 70–99)
Potassium: 4.2 mmol/L (ref 3.5–5.1)
Sodium: 136 mmol/L (ref 135–145)
Total Bilirubin: 0.6 mg/dL (ref 0.3–1.2)
Total Protein: 6.1 g/dL — ABNORMAL LOW (ref 6.5–8.1)

## 2021-05-03 LAB — GLUCOSE, CAPILLARY
Glucose-Capillary: 114 mg/dL — ABNORMAL HIGH (ref 70–99)
Glucose-Capillary: 116 mg/dL — ABNORMAL HIGH (ref 70–99)
Glucose-Capillary: 123 mg/dL — ABNORMAL HIGH (ref 70–99)

## 2021-05-03 LAB — PHOSPHORUS: Phosphorus: 3.8 mg/dL (ref 2.5–4.6)

## 2021-05-03 LAB — MAGNESIUM: Magnesium: 1.8 mg/dL (ref 1.7–2.4)

## 2021-05-03 MED ORDER — TRAVASOL 10 % IV SOLN
INTRAVENOUS | Status: AC
  Filled 2021-05-03: qty 1056

## 2021-05-03 MED ORDER — MAGNESIUM SULFATE 2 GM/50ML IV SOLN
2.0000 g | Freq: Once | INTRAVENOUS | Status: AC
  Administered 2021-05-03: 2 g via INTRAVENOUS
  Filled 2021-05-03: qty 50

## 2021-05-03 MED ORDER — DEXTROSE-NACL 5-0.9 % IV SOLN: INTRAVENOUS | Status: DC

## 2021-05-03 NOTE — Progress Notes (Signed)
PHARMACY - TOTAL PARENTERAL NUTRITION CONSULT NOTE   Indication: Gyn malignancy, subacute bowel obstruction, NPO  Patient Measurements: Height: '5\' 8"'$  (172.7 cm) Weight: 84.9 kg (187 lb 2.7 oz) IBW/kg (Calculated) : 63.9 TPN AdjBW (KG): 77 Body mass index is 28.46 kg/m. TBW 77 kg  Assessment: 27 yoF with peritoneal carcinomatosis, primary prob ovarian/fallopian, unable to obtain perc biopsy with CT > need diag lap with biopsy scheduled for 8/8, planned TPN after surgery - but ordered 8/8. Anticipate ascites will accumulate. Chemotherapy planned after biopsy results  Glucose / Insulin: 4 units from resumption of TPN 8/9 at 1800, (TPN off 8/9 at 2am) Dexamethasone '8mg'$  x1 on 8/8 for surgery CBGs 105 - 124,  requiring little coverage, but TPN not yet at goal rate Electrolytes: wnl, Mg at low end normal range Renal: wnl Hepatic: wnl on admit, TG 57, PreAlb 5.5 Intake / Output; MIVF: UOP not measured, MIVF resumed GI Imaging: 8/7 Abd/Pelvic CT with contrast: peritoneal carcinomatosis, moderate ascites, diffuse omental caking/cystic areas - none amenable to percutaneous biopsy. GI Surgeries / Procedures: Diagnostic Lap with biopsy 8/8  Central access: Has PAC placed 8/4, ? placement of PICC for TPN, need PAC for chemo? TPN start date: plan 8/8  Nutritional Goals (per RD recommendation on 8/8) kCal: 2100-2300, Protein: 100-115gm , Fluid: > 2L per day  Goal TPN rate 80 mL/hr (provides 106 g of protein and  2040 kcals per day)  Current Nutrition:  NPO, TPN 8/9 early am: TPN tubing disconnected, stopped TPN, started D10W at 94m/hr till next TPN bag 1800 on 8/9 Tolerating TPN, stooling  Plan:  Mg Sulfate 2gm bolus  At 1800:  Increase TPN to 80 mL/hr - goal rate Electrolytes in TPN: Na 534m/L, K 5026mL, Ca 5mE71m, Mg incr to 6mEq87m Phos 15mmo19m Cl:Ac 1:1 Add standard MVI and trace elements to TPN Add Folic Acid '1mg'$  to TPN, discontinued IVPB order Initiate Sensitive q6h SSI and  adjust as needed  Reduce MIVF to 10 ml/hr Monitor TPN labs on Mon/Thurs, BMET and Mag level 8/12  Hamad Whyte,Minda DittoD 05/03/2021,10:22 AM

## 2021-05-03 NOTE — Progress Notes (Signed)
PROGRESS NOTE    Brenda Stewart  [id number] DOB: May 21, 1951 DOA: 04/21/2021 PCP: Pcp, No      Brief Narrative:  Brenda Stewart is a 70 y/o F with HTN, GERD who presented with abdominal pain and was found to have SBO due to peritoneal carcinomatosis.  General surgery and oncology consulted and following.  Patient transferred to [facility] hospital to start chemotherapy.  Awaiting pathology prior to this.     Assessment & Plan:   Principal Problem:   SBO (small bowel obstruction) (HCC) Active Problems:   AKI (acute kidney injury) ([place])   Prolonged QT interval   Unintended weight loss   Nausea & vomiting   Hyponatremia   Neoplasm of peritoneum   Leukocytosis   Ascites   Thrombocytosis   Protein-calorie malnutrition, severe   Peritoneal carcinomatosis (HCC)   Hypokalemia   Carcinomatosis (HCC)   small bowel obstruction -Likely secondary to a gyn malignancy -Biopsy done 8/8 - TPN started 8/8  Peritoneal carcinomatosis/elevated CA125/concern for ovarian or fallopian tube cancer with biopsies pending -Pathology report pending -Status post paracentesis- cytology unrevealing -Patient status post port placement 04/26/2021. -Oncology following and hoping to start patient on chemotherapy with carboplatin and paclitaxel however awaiting pathology -Per oncology.  Malignant ascites -Status post paracentesis. -Concern for reaccumulation of ascites. -Cytology unrevealing -Per oncology.   Acute renal failure -Secondary to prerenal azotemia.   -Resolved with hydration.    Prolonged QT interval -Repeat EKG with resolution of QT prolongation. -Keep potassium close to 4, magnesium close to 2. -Follow.  Severe protein calorie malnutrition/unintended weight loss -Likely secondary to malignancy -Patient currently n.p.o. secondary to problem #1. -TPN started  Thrombocytosis -Resolved.  Hypomagnesemia -Repleted  Hyponatremia -trend  Iron deficiency anemia/folate  deficiency -Anemia panel consistent with iron deficiency anemia and folate deficiency. -Folate level high at 5.0, iron at 18, ferritin on 415.  Vitamin B12 at 952. -Hemoglobin stable  -Transfusion threshold hemoglobin < 7.     DVT prophylaxis: Heparin>>>> Lovenox Code Status: Full Family Communication: Updated patient.  No family at bedside. Disposition:   Status is: Inpatient  Remains inpatient appropriate because:Inpatient level of care appropriate due to severity of illness  Dispo: The patient is from: Home              Anticipated d/c is to: Home              Patient currently is not medically stable to d/c.-  initiation of chemo-- at least 1 more week in the hospital    Difficult to place patient No       Consultants:  GS: Dr. Rosendo Gros 04/21/2020 Oncology: Dr. Lorna Few 04/22/2020 GYN oncology: Dr. Denman George 04/24/2020  Procedures:  CT abdomen pelvis 04/21/2021 Abdominal films 04/22/2021, 04/24/2021, 04/26/2021 Abdominal ultrasound 04/22/2021 Ultrasound-guided left lower quadrant paracentesis--2.1 L of peritoneal fluid removed, per IR, Dr.Mugweru  Right IJ PowerPort placed per IR, Dr. Annamaria Boots 04/26/2021 CT abdomen and pelvis 04/29/2021      Subjective: No complaints   Objective: Vitals:   05/02/21 0605 05/02/21 1546 05/02/21 2155 05/03/21 0607  BP: 117/80 122/75 130/83 129/81  Pulse: 91 88 86 87  Resp: '19 16 18 18  '$ Temp: 98.2 F (36.8 C) 98.3 F (36.8 C) 98 F (36.7 C) 97.8 F (36.6 C)  TempSrc: Oral Oral Oral Oral  SpO2: 97% 98% 97% 99%  Weight:    84.9 kg  Height:        Intake/Output Summary (Last 24 hours) at 05/03/2021 1235  Last data filed at 05/03/2021 0729 Gross per 24 hour  Intake 1544.69 ml  Output 704 ml  Net 840.69 ml   Filed Weights   04/30/21 1416 05/01/21 0549 05/03/21 0607  Weight: 77 kg 82.5 kg 84.9 kg    Examination:   General: Appearance:     Overweight female in no acute distress     Lungs:      respirations unlabored  Heart:     Normal heart rate.    MS:   All extremities are intact.    Neurologic:   Awake, alert, oriented x 3. No apparent focal neurological           defect.            Data Reviewed: I have personally reviewed following labs and imaging studies  CBC: Recent Labs  Lab 04/27/21 0453 04/28/21 0032 04/29/21 0500 04/30/21 0858 05/01/21 0520 05/01/21 2050  WBC 9.3 9.4 11.8* 8.7 13.6* 13.0*  NEUTROABS 7.1  --   --   --  11.4*  --   HGB 10.9* 10.7* 11.0* 10.1* 10.1* 10.8*  HCT 33.0* 32.5* 33.4* 31.1* 30.2* 33.6*  MCV 91.4 91.8 91.8 92.8 91.8 93.9  PLT 311 289 281 267 275 [id number]    Basic Metabolic Panel: Recent Labs  Lab 04/27/21 0453 04/28/21 0032 04/29/21 0500 04/30/21 0858 05/01/21 0520 05/01/21 2050 05/02/21 0536 05/03/21 0524  NA 136 134* 134* 136 137 135 138 136  K 3.6 4.0 3.8 3.5 4.7 4.0 4.0 4.2  CL 103 100 100 102 102 100 104 102  CO2 '27 24 25 25 '$ 21* '27 26 24  '$ GLUCOSE 116* 94 108* 116* 160* 124* 115* 110*  BUN '10 9 9 '$ 7* '8 8 8 12  '$ CREATININE 0.93 0.93 0.88 0.92 0.86 0.88 0.85 0.88  CALCIUM 8.6* 8.3* 8.3* 8.2* 8.5* 8.3* 8.4* 8.4*  MG 2.1 1.7 2.2  --  1.8  --  1.7 1.8  PHOS 3.3  --   --   --  4.1  --  3.3 3.8    GFR: Estimated Creatinine Clearance: 69.8 mL/min (by C-G formula based on SCr of 0.88 mg/dL).  Liver Function Tests: Recent Labs  Lab 04/27/21 0453 05/01/21 0520 05/03/21 0524  AST 14* 13* 13*  ALT '8 9 7  '$ ALKPHOS 49 48 50  BILITOT 0.6 0.3 0.6  PROT 6.2* 6.2* 6.1*  ALBUMIN 2.9* 2.6* 2.5*    CBG: Recent Labs  Lab 05/02/21 1246 05/02/21 1806 05/02/21 2359 05/03/21 0609 05/03/21 1204  GLUCAP 124* 105* 123* 114* 116*     Recent Results (from the past 240 hour(s))  Body fluid culture w Gram Stain     Status: None   Collection Time: 04/24/21  1:55 PM   Specimen: Abdomen; Peritoneal Fluid  Result Value Ref Range Status   Specimen Description PERITONEAL FLUID  Final   Special Requests ABDOMEN  Final   Gram Stain   Final    WBC PRESENT,  PREDOMINANTLY MONONUCLEAR NO ORGANISMS SEEN CYTOSPIN SMEAR    Culture   Final    NO GROWTH 3 DAYS Performed at [facility], [place] [address], [place], [place] [address]    Report Status 04/27/2021 FINAL  Final          Radiology Studies: No results found.      Scheduled Meds:  Chlorhexidine Gluconate Cloth  6 each Topical Daily   enoxaparin (LOVENOX) injection  40 mg Subcutaneous Q24H   insulin aspart  0-9 Units  Subcutaneous Q6H   pantoprazole (PROTONIX) IV  40 mg Intravenous Daily   sodium chloride flush  10-40 mL Intracatheter Q12H   Continuous Infusions:  dextrose 5 % and 0.9% NaCl     magnesium sulfate bolus IVPB     TPN ADULT (ION) 60 mL/hr at 05/02/21 1717   TPN ADULT (ION)       LOS: 12 days    Time spent: 35 minutes    Geradine Girt, DO [facility]   To contact the attending provider between 7A-7P or the covering provider during after hours 7P-7A, please log into the web site [email or web address] and access using universal [place] password for that web site. If you do not have the password, please call the hospital operator.  05/03/2021, 12:35 PM

## 2021-05-03 NOTE — Progress Notes (Signed)
3 Days Post-Op Procedure(s) (LRB): LAPAROSCOPY DIAGNOSTIC WITH BIOPSIES (N/A)  GYN Oncology Progress Note  Subjective: Patient reports continues to report mild incisional soreness this am. No nausea or emesis reported. She states she had 3 loose bowel movements yesterday and two this am. No flatus reported. Ambulating without difficulty. Denies chest pain, dyspnea. No concerns voiced.  Objective: Vital signs in last 24 hours: Temp:  [97.8 F (36.6 C)-98.3 F (36.8 C)] 97.8 F (36.6 C) (08/11 0607) Pulse Rate:  [86-88] 87 (08/11 0607) Resp:  [16-18] 18 (08/11 0607) BP: (122-130)/(75-83) 129/81 (08/11 0607) SpO2:  [97 %-99 %] 99 % (08/11 0607) Weight:  [187 lb 2.7 oz (84.9 kg)] 187 lb 2.7 oz (84.9 kg) (08/11 0607) Last BM Date: 04/30/21  Intake/Output from previous day: 08/10 0701 - 08/11 0700 In: 1726.6 [I.V.:1519.9; IV Piggyback:206.7] Out: 1102 [Urine:1101; Stool:1]  Physical Examination: General: alert, cooperative, and no distress Resp: clear to auscultation bilaterally Cardio: regular rate and rhythm, S1, S2 normal, no murmur, click, rub or gallop GI: incision: lap sites to the abdomen with dermabond intact with no drainage and abdomen soft, non-distended, hypoactive bowel sounds Extremities: mild generalized edema bilaterally, non-pitting  Labs:   BUN/Cr/glu/ALT/AST/amyl/lip:  12/0.88/--/7/13/--/-- (08/11 [id number])  Assessment: 70 y.o. with peritoneal carcinomatosis and subacute bowel obstruction. She is s/p paracentesis with the cytology being nondiagnostic. IR felt there was not an area of significant disease to be biopsied. She then proceeded with surgery to obtain a biopsy with Dr. Berline Lopes. S/p Procedure(s): LAPAROSCOPY DIAGNOSTIC WITH BIOPSIES: stable. Path is pending.  Pain:  Pain is well-controlled on PRN medications.  Heme: Hgb 10.8 and Hct 33.6 on 05/02/21. Appropriate compared with pre-op values and surgical losses.  ID: WBC 13.0 on 05/02/21 am. Pt received decadron  in the OR. Continue to follow.  CV: BP and HR stable. Continue to monitor with routine vital sign assessments.  GI:  Tolerating po: NPO due to subacute bowel obstructions related to peritoneal carcinomatosis . Antiemetics ordered as needed. CT on 04/29/21: ascites, findings consistent with peritoneal carcinomatosis, pleural effusions. TPN running and management per pharmacy.  GU: Voiding. Creatinine 0.88.  FEN: No critical values. Pharmacy following labs due to TPN.  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 CBG (last 3)  Recent Labs    05/02/21 1806 05/02/21 2359 05/03/21 0609  GLUCAP 105* 123* 114*     Prophylaxis: Lovenox.   Plan: Continuing to wait for pathology results from diagnostic laparoscopy with biopsies with Dr. Berline Lopes on 8/8 with plans for initiation of chemotherapy under the care of Dr. Alvy Bimler. Continue with TPN Continue plan of care per Hospitalist Team and Dr. Alvy Bimler   LOS: 12 days     Brenda Stewart 05/03/2021, 10:43 AM

## 2021-05-03 NOTE — Progress Notes (Addendum)
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 Burr Medico who graciously excepted to transfer the patient to her care. Will arrange GI consult tomorrow In the meantime, I recommend continue supportive care She will probably get chemotherapy started tomorrow, likely different therapy as discussed   malignant ascites She will likely have reaccumulation of ascites with aggressive IV fluid hydration Monitor closely   Uncontrolled nausea and vomiting, stable Due to abdominal carcinomatosis This is not likely going to get better without treatment Continue antiemetics as needed For now, she does not need NG tube  Malnutrition due to n.p.o. status TPN was initiated on 04/30/2021  Acute renal failure, resolved Due to dehydration and carcinomatosis It is improving Recommend gentle fluid hydration only   Constipation, resolved Continue conservative approach I am concerned about risk of bowel perforation with laxative Monitor closely   Code Status Full code   Goals of care Resolution of bowel obstruction, ability to tolerate oral intake and resolution of nausea   Discharge planning I anticipate she will be in the hospital 5 to 7 days  All questions were answered. The patient knows to call the clinic with any problems, questions or concerns.   [name], NP 05/03/2021 7:57 AM Heath Lark, MD  Subjective:  She feels well this morning overall Denies pain Bowels are moving Denies nausea and vomiting TPN infusing without any difficulty  Pathology report was finally released this afternoon Clinical History: Peritoneal carcinomatosis (crm)    FINAL MICROSCOPIC DIAGNOSIS:    A. 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  Overall, the  findings are consistent with a signet ring cell carcinoma and a gastrointestinal primary is favored.  Dr. Saralyn Pilar reviewed the case and agrees with the above diagnosis.   MMR and HER2 by immunohistochemistry are pending and will be reported in  an addendum.   Objective:  Vitals:   05/02/21 2155 05/03/21 0607  BP: 130/83 129/81  Pulse: 86 87  Resp: 18 18  Temp: 98 F (36.7 C) 97.8 F (36.6 C)  SpO2: 97% 99%     Intake/Output Summary (Last 24 hours) at 05/03/2021 0757 Last data filed at 05/03/2021 0729 Gross per 24 hour  Intake 1544.69 ml  Output 1104 ml  Net 440.69 ml    GENERAL:alert, no distress and comfortable ABD: Positive bowel sounds, soft, nontender NEURO: alert & oriented x 3 with fluent speech, no focal motor/sensory deficits   Labs:  Recent Labs    05/20/20 1032 04/21/21 1413 04/27/21 0453 04/28/21 0032 05/01/21 0520 05/01/21 2050 05/02/21 0536 05/03/21 0524  NA 142   < > 136   < > 137 135 138 136  K 3.5   < > 3.6   < > 4.7 4.0 4.0 4.2  CL 104   < > 103   < >  102 100 104 102  CO2 28   < > 27   < > 21* '27 26 24  ' GLUCOSE 113*   < > 116*   < > 160* 124* 115* 110*  BUN 10   < > 10   < > '8 8 8 12  ' CREATININE 1.03*   < > 0.93   < > 0.86 0.88 0.85 0.88  CALCIUM 9.4   < > 8.6*   < > 8.5* 8.3* 8.4* 8.4*  GFRNONAA 56*   < > >60   < > >60 >60 >60 >60  GFRAA >60  --   --   --   --   --   --   --   PROT 7.9   < > 6.2*  --  6.2*  --   --  6.1*  ALBUMIN 4.1   < > 2.9*  --  2.6*  --   --  2.5*  AST 20   < > 14*  --  13*  --   --  13*   ALT 14   < > 8  --  9  --   --  7  ALKPHOS 73   < > 49  --  48  --   --  50  BILITOT 0.6   < > 0.6  --  0.3  --   --  0.6   < > = values in this interval not displayed.    Studies:  CT ABDOMEN PELVIS WO CONTRAST  Result Date: 04/21/2021 CLINICAL DATA:  Nausea and vomiting for 2 days. Lower abdominal pain for approximately 3 months. Chronic constipation. EXAM: CT ABDOMEN AND PELVIS WITHOUT CONTRAST TECHNIQUE: Multidetector CT imaging of the abdomen and pelvis was performed following the standard protocol without IV contrast. COMPARISON:  None. FINDINGS: Lower chest: No acute findings. Hepatobiliary: No mass visualized on this unenhanced exam. Gallbladder is unremarkable. No evidence of biliary ductal dilatation. Pancreas: No mass or inflammatory process visualized on this unenhanced exam. Spleen:  Within normal limits in size. Adrenals/Urinary tract: No evidence of urolithiasis or hydronephrosis. Unremarkable unopacified urinary bladder. Stomach/Bowel: Small hiatal hernia is seen containing ascites. Moderate diffuse small bowel dilatation is seen, with suspected transition point in the right lower quadrant, suspicious for distal small bowel obstruction. Vascular/Lymphatic: No pathologically enlarged lymph nodes identified. No evidence of abdominal aortic aneurysm. Aortic atherosclerotic calcification noted. Reproductive: Prior hysterectomy suspected. Moderate ascites is seen. Omental caking and diffuse mesenteric soft tissue stranding is seen. There is also probable peritoneal nodularity along the left pelvic sidewall. These findings are highly suspicious for peritoneal carcinomatosis. Other:  None. Musculoskeletal:  No suspicious bone lesions identified. 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 Suspected distal small bowel obstruction, with probable transition point in right lower quadrant. Aortic Atherosclerosis (ICD10-I70.0). Electronically Signed   By: Marlaine Hind  M.D.   On: 04/21/2021 18:36   CT ABDOMEN PELVIS W CONTRAST  Result Date: 04/29/2021 CLINICAL DATA:  70 year old female for staging of peritoneal carcinomatosis prior to chemotherapy. EXAM: CT ABDOMEN AND PELVIS WITH CONTRAST TECHNIQUE: Multidetector CT imaging of the abdomen and pelvis was performed using the standard protocol following bolus administration of intravenous contrast. CONTRAST:  66m OMNIPAQUE IOHEXOL 350 MG/ML SOLN COMPARISON:  04/21/2021 noncontrast study. FINDINGS: Lower chest: A small LEFT pleural effusion with LEFT LOWER lung atelectasis is now noted. A trace RIGHT pleural effusion with mild RIGHT basilar atelectasis is identified. Hepatobiliary: Small hypodense lesions along the INFERIOR RIGHT liver are compatible  with subserosal implants. No other hepatic abnormalities are present. The gallbladder appears mildly thick walled. No biliary dilatation. Pancreas: Unremarkable Spleen: Unremarkable Adrenals/Urinary Tract: The kidneys, adrenal glands and bladder are unremarkable. Stomach/Bowel: No dilated bowel loops are noted. No definite focal bowel wall thickening is identified. Vascular/Lymphatic: Aortic atherosclerosis. No enlarged abdominal or pelvic lymph nodes. Reproductive: Patient is status post hysterectomy. No definite adnexal masses are noted. Other: Moderate ascites within the anterior/mid abdomen extending into the pelvis are noted. There is mild peritoneal thickening/minimal peritoneal irregularity noted in several areas. There are smaller focal fluid/cystic areas within the RIGHT abdomen and LOWER pelvis. Diffuse omental thickening/caking is noted. Mild stranding within the mesentery is also present. No evidence of pneumoperitoneum. Musculoskeletal: No acute or suspicious bony abnormalities are identified. IMPRESSION: 1. Moderate ascites, mild peritoneal thickening and diffuse omental thickening/caking and smaller focal fluid/cystic areas within the RIGHT abdomen and LOWER pelvis,  compatible with peritoneal carcinomatosis. Mild mesenteric stranding is nonspecific. 2. Small subserosal implants along the INFERIOR RIGHT liver. 3. Small LEFT pleural effusion and trace RIGHT pleural effusion with LEFT LOWER lung atelectasis. 4. Aortic Atherosclerosis (ICD10-I70.0). Electronically Signed   By: Margarette Canada M.D.   On: 04/29/2021 12:16   US Abdomen Limited  Result Date: 04/22/2021 CLINICAL DATA:  Peritoneal neoplasm abdominal distension, recurrent ascites EXAM: LIMITED ABDOMEN ULTRASOUND FOR ASCITES TECHNIQUE: Limited ultrasound survey for ascites was performed in all four abdominal quadrants. COMPARISON:  04/21/2021 CT FINDINGS: Survey of the abdominal 4 quadrants demonstrates a small volume of lower abdominal ascites by ultrasound. Unable to perform paracentesis because of intractable nausea vomiting today. IMPRESSION: Small volume of lower abdominopelvic ascites. Will reassess for paracentesis tomorrow 04/23/2021. Electronically Signed   By: Jerilynn Mages.  Shick M.D.   On: 04/22/2021 12:09   DG Abd 2 Views  Result Date: 04/28/2021 CLINICAL DATA:  Small bowel obstruction. EXAM: ABDOMEN - 2 VIEW COMPARISON:  04/26/2021 and earlier FINDINGS: There is focal atelectasis or. No free intraperitoneal consolidation MEDIAL LEFT LOWER lobe air. Persistent mild dilatation of small bowel loops in the LEFT central abdomen, similar in appearance to previous CT exam. Air is identified within nondilated loops of colon. There are degenerative changes in both hips. IMPRESSION: 1. Persistent mild dilatation of small bowel loops in the LEFT central abdomen. 2. LEFT LOWER lobe atelectasis or consolidation. Electronically Signed   By: Nolon Nations M.D.   On: 04/28/2021 10:13   DG Abd 2 Views  Result Date: 04/26/2021 CLINICAL DATA:  Follow-up small bowel obstruction. EXAM: ABDOMEN - 2 VIEW COMPARISON:  A2 22 FINDINGS: There is mild residual gaseous distension of multiple small bowel loops, decreased from prior. A small  amount of gas is scattered in the colon and rectum. No intraperitoneal free air is identified. Atelectasis is noted in the left lung base. IMPRESSION: Decreased small bowel distension compatible with improving obstruction. Electronically Signed   By: Logan Bores M.D.   On: 04/26/2021 12:45   DG Abd Portable 1V  Result Date: 04/24/2021 CLINICAL DATA:  Small-bowel obstruction. EXAM: PORTABLE ABDOMEN - 1 VIEW COMPARISON:  04/22/2021.  CT 04/21/2021. FINDINGS: Mild-to-moderately distended loops of small bowel with a relative paucity of colonic gas again noted. Findings again consistent with small bowel obstruction. No free air. Ascites most likely present. Pelvic calcifications consistent phleboliths. Degenerative changes lumbar spine and both hips. IMPRESSION: Mild-to-moderate small-bowel distention with a paucity of intra colonic air again noted. Findings again consistent with small bowel obstruction. Ascites most likely present. Electronically Signed   By:  [place]   On: 04/24/2021 11:08   DG Abd Portable 1V-Small Bowel Obstruction Protocol-initial, 8 hr delay  Result Date: 04/22/2021 CLINICAL DATA:  Small bowel obstruction, 8 hour film. EXAM: PORTABLE ABDOMEN - 1 VIEW COMPARISON:  CT yesterday. FINDINGS: Administered enteric contrast has reached the ascending and proximal transverse colon. There is enteric contrast within prominent small bowel in the central abdomen. Left upper quadrant contrast may be residual within small bowel or at the splenic flexure. No evidence of free air. IMPRESSION: Administered enteric contrast has reached the ascending and proximal transverse colon. There is enteric contrast within prominent small bowel in the central abdomen. Left upper quadrant contrast may be residual within small bowel or at the splenic flexure. Electronically Signed   By: Keith Rake M.D.   On: 04/22/2021 17:38   IR IMAGING GUIDED PORT INSERTION  Result Date: 04/26/2021 CLINICAL DATA:   Peritoneal carcinomatosis, unknown primary, access for chemotherapy EXAM: RIGHT INTERNAL JUGULAR SINGLE LUMEN POWER PORT CATHETER INSERTION Date:  04/26/2021 04/26/2021 2:15 pm Radiologist:  M. Daryll Brod, MD Guidance:  Ultrasound and fluoroscopic MEDICATIONS: 1% lidocaine with epinephrine local ANESTHESIA/SEDATION: Versed 2.0 mg IV; Fentanyl 100 mcg IV; Moderate Sedation Time:  25 minutes The patient was continuously monitored during the procedure by the interventional radiology nurse under my direct supervision. FLUOROSCOPY TIME:  One minutes, 0 seconds (5 mGy) COMPLICATIONS: None immediate. CONTRAST:  None. PROCEDURE: Informed consent was obtained from the patient following explanation of the procedure, risks, benefits and alternatives. The patient understands, agrees and consents for the procedure. All questions were addressed. A time out was performed. Maximal barrier sterile technique utilized including caps, mask, sterile gowns, sterile gloves, large sterile drape, hand hygiene, and 2% chlorhexidine scrub. Under sterile conditions and local anesthesia, right internal jugular micropuncture venous access was performed. Access was performed with ultrasound. Images were obtained for documentation of the patent right internal jugular vein. A guide wire was inserted followed by a transitional dilator. This allowed insertion of a guide wire and catheter into the IVC. Measurements were obtained from the SVC / RA junction back to the right IJ venotomy site. In the right infraclavicular chest, a subcutaneous pocket was created over the second anterior rib. This was done under sterile conditions and local anesthesia. 1% lidocaine with epinephrine was utilized for this. A 2.5 cm incision was made in the skin. Blunt dissection was performed to create a subcutaneous pocket over the right pectoralis major muscle. The pocket was flushed with saline vigorously. There was adequate hemostasis. The port catheter was assembled and  checked for leakage. The port catheter was secured in the pocket with two retention sutures. The tubing was tunneled subcutaneously to the right venotomy site and inserted into the SVC/RA junction through a valved peel-away sheath. Position was confirmed with fluoroscopy. Images were obtained for documentation. The patient tolerated the procedure well. No immediate complications. Incisions were closed in a two layer fashion with 4 - 0 Vicryl suture. Dermabond was applied to the skin. The port catheter was accessed, blood was aspirated followed by saline and heparin flushes. Needle was removed. A dry sterile dressing was applied. IMPRESSION: Ultrasound and fluoroscopically guided right internal jugular single lumen power port catheter insertion. Tip in the SVC/RA junction. Catheter ready for use. Electronically Signed   By: Jerilynn Mages.  Shick M.D.   On: 04/26/2021 14:22   IR Paracentesis  Result Date: 04/24/2021 INDICATION: Abdominal distention secondary to ascites. Underlying concern for peritoneal carcinomatosis on imaging findings. Request for  diagnostic and therapeutic paracentesis. EXAM: ULTRASOUND GUIDED LEFT LOWER QUADRANT PARACENTESIS MEDICATIONS: 1% plain lidocaine, 5 mL COMPLICATIONS: None immediate. PROCEDURE: Informed written consent was obtained from the patient after a discussion of the risks, benefits and alternatives to treatment. A timeout was performed prior to the initiation of the procedure. Initial ultrasound scanning demonstrates a large amount of ascites within the left lower abdominal quadrant. The left lower abdomen was prepped and draped in the usual sterile fashion. 1% lidocaine was used for local anesthesia. Following this, a 19 gauge, 7-cm, Yueh catheter was introduced. An ultrasound image was saved for documentation purposes. The paracentesis was performed. The catheter was removed and a dressing was applied. The patient tolerated the procedure well without immediate post procedural  complication. FINDINGS: A total of approximately 2.1 L of clear yellow fluid was removed. Samples were sent to the laboratory as requested by the clinical team. IMPRESSION: Successful ultrasound-guided paracentesis yielding 2.1 liters of peritoneal fluid. Read by: Ascencion Dike PA-C Electronically Signed   By: Michaelle Birks MD   On: 04/24/2021 12:38

## 2021-05-04 ENCOUNTER — Inpatient Hospital Stay: Payer: Self-pay

## 2021-05-04 ENCOUNTER — Inpatient Hospital Stay (HOSPITAL_COMMUNITY): Payer: Medicare Other

## 2021-05-04 ENCOUNTER — Encounter (HOSPITAL_COMMUNITY): Payer: Self-pay | Admitting: Internal Medicine

## 2021-05-04 DIAGNOSIS — K56609 Unspecified intestinal obstruction, unspecified as to partial versus complete obstruction: Secondary | ICD-10-CM | POA: Diagnosis not present

## 2021-05-04 LAB — BASIC METABOLIC PANEL
Anion gap: 10 (ref 5–15)
BUN: 17 mg/dL (ref 8–23)
CO2: 23 mmol/L (ref 22–32)
Calcium: 8.9 mg/dL (ref 8.9–10.3)
Chloride: 101 mmol/L (ref 98–111)
Creatinine, Ser: 0.75 mg/dL (ref 0.44–1.00)
GFR, Estimated: >60 mL/min (ref >60)
Glucose, Bld: 118 mg/dL — ABNORMAL HIGH (ref 70–99)
Potassium: 4.6 mmol/L (ref 3.5–5.1)
Sodium: 134 mmol/L — ABNORMAL LOW (ref 135–145)

## 2021-05-04 LAB — GLUCOSE, CAPILLARY
Glucose-Capillary: 110 mg/dL — ABNORMAL HIGH (ref 70–99)
Glucose-Capillary: 118 mg/dL — ABNORMAL HIGH (ref 70–99)
Glucose-Capillary: 144 mg/dL — ABNORMAL HIGH (ref 70–99)
Glucose-Capillary: 118 mg/dL — ABNORMAL HIGH (ref 70–99)
Glucose-Capillary: 156 mg/dL — ABNORMAL HIGH (ref 70–99)

## 2021-05-04 LAB — HEPARIN LEVEL (UNFRACTIONATED): Heparin Unfractionated: 0.37 IU/mL (ref 0.30–0.70)

## 2021-05-04 LAB — MAGNESIUM: Magnesium: 2.1 mg/dL (ref 1.7–2.4)

## 2021-05-04 MED ORDER — DEXTROSE 5 % IV SOLN: Freq: Once | INTRAVENOUS | Status: DC

## 2021-05-04 MED ORDER — HEPARIN (PORCINE) 25000 UT/250ML-% IV SOLN
1200.0000 [IU]/h | INTRAVENOUS | Status: DC
  Administered 2021-05-04 – 2021-05-07 (×4): 1200 [IU]/h via INTRAVENOUS
  Filled 2021-05-04 (×4): qty 250

## 2021-05-04 MED ORDER — SODIUM CHLORIDE 0.9 % IV SOLN: 400.0000 mg/m2 | Freq: Once | INTRAVENOUS | Status: DC

## 2021-05-04 MED ORDER — SODIUM CHLORIDE 0.9% FLUSH
10.0000 mL | Freq: Two times a day (BID) | INTRAVENOUS | Status: DC
  Administered 2021-05-05 – 2021-05-08 (×6): 10 mL

## 2021-05-04 MED ORDER — SODIUM CHLORIDE 0.9% FLUSH: 10.0000 mL | INTRAVENOUS | Status: DC | PRN

## 2021-05-04 MED ORDER — IOHEXOL 350 MG/ML SOLN
60.0000 mL | Freq: Once | INTRAVENOUS | Status: AC | PRN
  Administered 2021-05-04: 60 mL via INTRAVENOUS

## 2021-05-04 MED ORDER — PALONOSETRON HCL INJECTION 0.25 MG/5ML
0.2500 mg | Freq: Once | INTRAVENOUS | Status: AC
  Administered 2021-05-04: 0.25 mg via INTRAVENOUS
  Filled 2021-05-04: qty 5

## 2021-05-04 MED ORDER — SODIUM CHLORIDE 0.9 % IV SOLN
10.0000 mg | Freq: Once | INTRAVENOUS | Status: AC
  Administered 2021-05-04: 10 mg via INTRAVENOUS
  Filled 2021-05-04: qty 1

## 2021-05-04 MED ORDER — FLUOROURACIL CHEMO INJECTION 5 GM/100ML
2450.0000 mg/m2 | Freq: Once | INTRAVENOUS | Status: AC
  Administered 2021-05-04: 5000 mg via INTRAVENOUS
  Filled 2021-05-04: qty 100

## 2021-05-04 MED ORDER — HEPARIN BOLUS VIA INFUSION
2000.0000 [IU] | Freq: Once | INTRAVENOUS | Status: AC
  Administered 2021-05-04: 2000 [IU] via INTRAVENOUS
  Filled 2021-05-04: qty 2000

## 2021-05-04 MED ORDER — LEUCOVORIN CALCIUM INJECTION 350 MG
400.0000 mg/m2 | Freq: Once | INTRAVENOUS | Status: AC
  Administered 2021-05-04: 816 mg via INTRAVENOUS
  Filled 2021-05-04: qty 40.8

## 2021-05-04 MED ORDER — OXALIPLATIN CHEMO INJECTION 100 MG/20ML
85.0000 mg/m2 | Freq: Once | INTRAVENOUS | Status: AC
  Administered 2021-05-04: 175 mg via INTRAVENOUS
  Filled 2021-05-04: qty 35

## 2021-05-04 MED ORDER — TRACE MINERALS CU-MN-SE-ZN 300-55-60-3000 MCG/ML IV SOLN
INTRAVENOUS | Status: AC
  Filled 2021-05-04: qty 1056

## 2021-05-04 MED ORDER — DEXTROSE 5 % IV SOLN: Freq: Once | INTRAVENOUS | Status: AC

## 2021-05-04 NOTE — Progress Notes (Signed)
Chemotherapy reviewed and checked by Jerrel Ivory (Chemo competent nurse) and this RN.

## 2021-05-04 NOTE — Progress Notes (Addendum)
PHARMACY - TOTAL PARENTERAL NUTRITION CONSULT NOTE   Indication: Gyn malignancy, subacute bowel obstruction, NPO  Patient Measurements: Height: '5\' 8"'$  (172.7 cm) Weight: 87.1 kg (192 lb 0.3 oz) IBW/kg (Calculated) : 63.9 TPN AdjBW (KG): 77 Body mass index is 29.2 kg/m. TBW 77 kg  Assessment: 72 yoF with peritoneal carcinomatosis, primary unknown on admit, unable to obtain perc biopsy with CT > need diag lap with biopsy scheduled for 8/8, planned TPN after surgery - but ordered 8/8. Anticipate ascites will accumulate. Chemotherapy planned after biopsy results  Glucose / Insulin: Zero units insulin Dexamethasone '8mg'$  x1 on 8/8 for surgery CBGs  Electrolytes: wnl, Na sl below low end normal, CorrCa 10.1 Renal: wnl Hepatic: wnl on admit, TG 57, PreAlb 5.5 Intake / Output; MIVF: UOP not measured, MIVF resumed GI Imaging: 8/7 Abd/Pelvic CT with contrast: peritoneal carcinomatosis, moderate ascites, diffuse omental caking/cystic areas - none amenable to percutaneous biopsy. GI Surgeries / Procedures: Diagnostic Lap with biopsy 8/8 Onc note: likely GI primary - signet ring Ca, will see change in chemo orders, note pt has only one central IV site [facility]) that TPN is infusing into.  Central access: Has PAC placed 8/4 TPN start date: plan 8/8  Nutritional Goals (per RD recommendation on 8/8) kCal: 2100-2300, Protein: 100-115gm , Fluid: > 2L per day  Goal TPN rate 80 mL/hr (provides 106 g of protein and  2040 kcals per day)  Current Nutrition:  NPO, TPN 8/9 early am: TPN tubing disconnected, stopped TPN, started D10W at 47m/hr till next TPN bag 1800 on 8/9 Tolerating TPN, stooling  Plan:  Reducing current bag rate to 40 ml/hr prior to stopping TPN for PAC use for chemo. PICC line ordered - for TPN administration with new bag at 1800  At 1800:  Increase TPN to 80 mL/hr - goal rate Electrolytes in TPN: Na incr to 633m/L, K decr to 4565mL, Ca 5mE65m, Mg 5mEq47m Phos 15mmo10m Cl:Ac  1:1 Add standard MVI and trace elements to TPN Checking with ONC about Folic acid '1mg'$  IV daily (ordered by Gyn-Onc) with Leucovorin use in Folfox regimen, have removed from TPN today 8/12 - can resume 8/13 Discontinue SSI, CBGs Reduce MIVF to 10 ml/hr Monitor TPN labs on Mon/Thurs, BMET and Mag level 8/13  Antigone Crowell,Minda DittoD 05/04/2021,6:59 AM

## 2021-05-04 NOTE — TOC Initial Note (Signed)
Transition of Care [facility]) - Initial/Assessment Note    Patient Details  Name: Brenda Stewart MRN: [id number] Date of Birth: 1951-04-17  Transition of [facility]) CM/SW Contact:    Lynnell Catalan, RN Phone Number: 05/04/2021, 2:33 PM  Clinical Narrative:                 Pt from home with spouse. Pt has been started on TPN in the hospital and per medical notes will dc with home TPN. Choice offered to pt for home infusion company and [employer], manufacturing systems. Choice offered for home health RN services and [facility] chosen. Referrals have been made to both companies. TOC will continue to follow along.  Expected Discharge Plan: [place] Barriers to Discharge: Continued Medical Work up   Patient Goals and CMS Choice Patient states their goals for this hospitalization and ongoing recovery are:: To go home CMS Medicare.gov Compare Post Acute Care list provided to:: Patient Choice offered to / list presented to : Patient  Expected Discharge Plan and Services Expected Discharge Plan: [place]   Discharge Planning Services: CM Consult Post Acute Care Choice: [place] arrangements for the past 2 months: Single Family Home                           HH Arranged: RN [place] Agency: [employer], [place] Date [facility] Agency Contacted: 05/04/21 Time HH Agency Contacted: 1200 Representative spoke with at [place]: Tommi Rumps and Jeannene Patella  Prior Living Arrangements/Services Living arrangements for the past 2 months: [place] with:: Spouse Patient language and need for interpreter reviewed:: Yes Do you feel safe going back to the place where you live?: Yes      Need for Family Participation in Patient Care: Yes (Comment) Care giver support system in place?: Yes (comment)   Criminal Activity/Legal Involvement Pertinent to Current Situation/Hospitalization: No - Comment as needed  Activities of Daily Living Home Assistive Devices/Equipment:  None ADL Screening (condition at time of admission) Patient's cognitive ability adequate to safely complete daily activities?: Yes Is the patient deaf or have difficulty hearing?: No Does the patient have difficulty seeing, even when wearing glasses/contacts?: No Does the patient have difficulty concentrating, remembering, or making decisions?: No Patient able to express need for assistance with ADLs?: Yes Does the patient have difficulty dressing or bathing?: No Independently performs ADLs?: Yes (appropriate for developmental age) Does the patient have difficulty walking or climbing stairs?: No Weakness of Legs: None Weakness of Arms/Hands: None  Permission Sought/Granted Permission sought to share information with : Facility Art therapist granted to share information with : Yes, Verbal Permission Granted     Permission granted to share info w AGENCY: [place] [facility]        Emotional Assessment Appearance:: Appears stated age Attitude/Demeanor/Rapport: Gracious Affect (typically observed): Calm Orientation: : Oriented to Self, Oriented to Place, Oriented to  Time, Oriented to Situation Alcohol / Substance Use: Not Applicable Psych Involvement: No (comment)  Admission diagnosis:  Small bowel obstruction (HCC) [K56.609] SBO (small bowel obstruction) (HCC) [K56.609] Abnormal CT scan [R93.89] Encounter for imaging study to confirm nasogastric (NG) tube placement [Z01.89] Peritoneal carcinomatosis ([place]) [C78.6] AKI (acute kidney injury) ([place]) [N17.9] Ascites [R18.8] Carcinomatosis ([place]) [C80.0] Patient Active Problem List   Diagnosis Date Noted   Carcinomatosis ([place]) 04/30/2021   Peritoneal carcinomatosis ([place])    Hypokalemia    Primary peritoneal carcinomatosis ([place]) 04/25/2021  Protein-calorie malnutrition, severe 04/23/2021   SBO (small bowel obstruction) ([place]) 04/21/2021   AKI (acute kidney injury) ([place]) 04/21/2021   Prolonged QT interval 04/21/2021    Unintended weight loss 04/21/2021   Nausea & vomiting 04/21/2021   Hyponatremia 04/21/2021   Neoplasm of peritoneum 04/21/2021   Leukocytosis 04/21/2021   Ascites 04/21/2021   Thrombocytosis 04/21/2021   PCP:  Pcp, No Pharmacy:   [id number] [id number]  [id number]: [phone]: [phone]    Social Determinants of Health ([place] Interventions    Readmission Risk Interventions Readmission Risk Prevention Plan 04/27/2021  Transportation Screening Complete  PCP or Specialist Appt within 5-7 Days Complete  Home Care Screening Complete  Medication Review (RN CM) Complete  Some recent data might be hidden

## 2021-05-04 NOTE — Progress Notes (Signed)
Attempted bilateral lower extremity venous duplex, however patient is in a sterile procedure. Will attempt again as schedule permits.  05/04/2021 2:47 PM Kelby Aline., MHA, RVT, RDCS, RDMS

## 2021-05-04 NOTE — Progress Notes (Addendum)
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  MMR and HER2 are pending.  The patient reports that she is feeling fairly well this morning.  She denies abdominal discomfort.  No nausea or vomiting this morning.  Reports frequent bowel movements about 5 times a day.  Reports stools are liquid.  She is still not really eating.  TPN infusing.  The patient states that she lives locally by herself.  She has a daughter who lives in [place].  She has a friend that can help her out locally.  The patient has not been seen by GI locally.  She was scheduled to see GI at the [name] in September.  Oncology History  Primary peritoneal carcinomatosis ([place])  04/25/2021 Initial Diagnosis   Primary peritoneal carcinomatosis ([place])   04/27/2021 - 04/27/2021 Chemotherapy           REVIEW OF SYSTEMS:   Constitutional: Denies fevers, chills  Eyes: Denies blurriness of vision Ears, nose, mouth, throat, and face: Denies mucositis or sore throat Respiratory: Denies cough, dyspnea or wheezes Cardiovascular: Denies palpitation, chest discomfort Gastrointestinal: Denies abdominal pain, nausea, vomiting.  Reports loose stools about 5 times a day. Skin: Denies abnormal skin rashes Lymphatics: Denies new lymphadenopathy or easy bruising Neurological:Denies numbness, tingling or new weaknesses Behavioral/Psych: Mood is stable, no new changes  Extremities: No lower extremity edema All other systems were reviewed with the patient and are negative.  I have reviewed the past medical history, past surgical history, social history and family history with the patient and they are unchanged from previous note.   PHYSICAL EXAMINATION: ECOG PERFORMANCE STATUS: 2 - Symptomatic, <50% confined to bed  Vitals:   05/03/21 2144 05/04/21 0529  BP: 134/81 130/84  Pulse: 98 91  Resp: 18 18  Temp: 98.3  F (36.8 C) 98 F (36.7 C)  SpO2: 96% 96%   Filed Weights   05/01/21 0549 05/03/21 0607 05/04/21 0548  Weight: 82.5 kg 84.9 kg 87.1 kg    Intake/Output from previous day: 08/11 0701 - 08/12 0700 In: 2237 [P.O.:240; I.V.:1947; IV Piggyback:50] Out: 500 [Urine:400; Emesis/NG output:100]  GENERAL:alert, no distress and comfortable SKIN: skin color, texture, turgor are normal, no rashes or significant lesions EYES: normal, Conjunctiva are pink and non-injected, sclera clear OROPHARYNX:no exudate, no erythema and lips, buccal mucosa, and tongue normal  LYMPH:  no palpable lymphadenopathy in the cervical, axillary or inguinal LUNGS: clear to auscultation and percussion with normal breathing effort HEART: regular rate & rhythm and no murmurs and no lower extremity edema ABDOMEN:abdomen soft, non-tender and normal bowel sounds Musculoskeletal:no cyanosis of digits and no clubbing  NEURO: alert & oriented x 3 with fluent speech, no focal motor/sensory deficits  LABORATORY DATA:  I have reviewed the data as listed CMP Latest Ref Rng & Units 05/04/2021 05/03/2021 05/02/2021  Glucose 70 - 99 mg/dL 118(H) 110(H) 115(H)  BUN 8 - 23 mg/dL _0 Creatinine 0.44 - 1.00 mg/dL 0.75 0.88 0.85  Sodium 135 - 145 mmol/L 134(L) 136 138  Potassium 3.5 - 5.1 mmol/L 4.6 4.2 4.0  Chloride 98 - 111 mmol/L 101 102 104  CO2 22 - 32 mmol/L _1 Calcium 8.9 - 10.3 mg/dL 8.9 8.4(L) 8.4(L)  Total Protein 6.5 - 8.1 g/dL - 6.1(L) -  Total Bilirubin 0.3 - 1.2 mg/dL - 0.6 -  Alkaline Phos 38 - 126 U/L - 50 -  AST 15 - 41 U/L - 13(L) -  ALT 0 - 44 U/L - 7 -    Lab Results  Component Value Date   WBC 13.0 (H) 05/01/2021   HGB 10.8 (L) 05/01/2021   HCT 33.6 (L) 05/01/2021   MCV 93.9 05/01/2021   PLT 300 05/01/2021   NEUTROABS 11.4 (H) 05/01/2021    CT ABDOMEN PELVIS WO CONTRAST  Result Date: 04/21/2021 CLINICAL DATA:  Nausea and vomiting for 2 days. Lower abdominal pain for approximately 3 months.  Chronic constipation. EXAM: CT ABDOMEN AND PELVIS WITHOUT CONTRAST TECHNIQUE: Multidetector CT imaging of the abdomen and pelvis was performed following the standard protocol without IV contrast. COMPARISON:  None. FINDINGS: Lower chest: No acute findings. Hepatobiliary: No mass visualized on this unenhanced exam. Gallbladder is unremarkable. No evidence of biliary ductal dilatation. Pancreas: No mass or inflammatory process visualized on this unenhanced exam. Spleen:  Within normal limits in size. Adrenals/Urinary tract: No evidence of urolithiasis or hydronephrosis. Unremarkable unopacified urinary bladder. Stomach/Bowel: Small hiatal hernia is seen containing ascites. Moderate diffuse small bowel dilatation is seen, with suspected transition point in the right lower quadrant, suspicious for distal small bowel obstruction. Vascular/Lymphatic: No pathologically enlarged lymph nodes identified. No evidence of abdominal aortic aneurysm. Aortic atherosclerotic calcification noted. Reproductive: Prior hysterectomy suspected. Moderate ascites is seen. Omental caking and diffuse mesenteric soft tissue stranding is seen. There is also probable peritoneal nodularity along the left pelvic sidewall. These findings are highly suspicious for peritoneal carcinomatosis. Other:  None. Musculoskeletal:  No suspicious bone lesions identified. 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 Suspected distal small bowel obstruction, with probable transition point in right lower quadrant. Aortic Atherosclerosis (ICD10-I70.0). Electronically Signed   By: Marlaine Hind M.D.   On: 04/21/2021 18:36   CT ABDOMEN PELVIS W CONTRAST  Result Date: 04/29/2021 CLINICAL DATA:  70 year old female for staging of peritoneal carcinomatosis prior to chemotherapy. EXAM: CT ABDOMEN AND PELVIS WITH CONTRAST TECHNIQUE: Multidetector CT imaging of the abdomen and pelvis was performed using the standard protocol following  bolus administration of intravenous contrast. CONTRAST:  27m OMNIPAQUE IOHEXOL 350 MG/ML SOLN COMPARISON:  04/21/2021 noncontrast study. FINDINGS: Lower chest: A small LEFT pleural effusion with LEFT LOWER lung atelectasis is now noted. A trace RIGHT pleural effusion with mild RIGHT basilar atelectasis is identified. Hepatobiliary: Small hypodense lesions along the INFERIOR RIGHT liver are compatible with subserosal implants. No other hepatic abnormalities are present. The gallbladder appears mildly thick walled. No biliary dilatation. Pancreas: Unremarkable Spleen: Unremarkable Adrenals/Urinary Tract: The kidneys, adrenal glands and bladder are unremarkable. Stomach/Bowel: No dilated bowel loops are noted. No definite focal bowel wall thickening is identified. Vascular/Lymphatic: Aortic atherosclerosis. No enlarged abdominal or pelvic lymph nodes. Reproductive: Patient is status post hysterectomy. No definite adnexal masses are noted. Other: Moderate ascites within the anterior/mid abdomen extending into the pelvis are noted. There is mild peritoneal thickening/minimal peritoneal irregularity noted in several areas. There are smaller focal fluid/cystic areas within the RIGHT abdomen and LOWER pelvis. Diffuse omental thickening/caking is noted. Mild stranding within the mesentery is also present. No evidence of pneumoperitoneum. Musculoskeletal: No acute or suspicious bony abnormalities are identified. IMPRESSION: 1. Moderate ascites, mild peritoneal thickening and diffuse omental thickening/caking and smaller focal fluid/cystic areas within the RIGHT abdomen and LOWER pelvis, compatible with peritoneal carcinomatosis. Mild mesenteric stranding is nonspecific. 2. Small subserosal implants along the INFERIOR RIGHT liver. 3. Small LEFT pleural effusion and trace RIGHT pleural effusion with LEFT LOWER lung atelectasis. 4. Aortic Atherosclerosis (  ICD10-I70.0). Electronically Signed   By: Margarette Canada M.D.   On:  04/29/2021 12:16   US Abdomen Limited  Result Date: 04/22/2021 CLINICAL DATA:  Peritoneal neoplasm abdominal distension, recurrent ascites EXAM: LIMITED ABDOMEN ULTRASOUND FOR ASCITES TECHNIQUE: Limited ultrasound survey for ascites was performed in all four abdominal quadrants. COMPARISON:  04/21/2021 CT FINDINGS: Survey of the abdominal 4 quadrants demonstrates a small volume of lower abdominal ascites by ultrasound. Unable to perform paracentesis because of intractable nausea vomiting today. IMPRESSION: Small volume of lower abdominopelvic ascites. Will reassess for paracentesis tomorrow 04/23/2021. Electronically Signed   By: Jerilynn Mages.  Shick M.D.   On: 04/22/2021 12:09   DG Abd 2 Views  Result Date: 04/28/2021 CLINICAL DATA:  Small bowel obstruction. EXAM: ABDOMEN - 2 VIEW COMPARISON:  04/26/2021 and earlier FINDINGS: There is focal atelectasis or. No free intraperitoneal consolidation MEDIAL LEFT LOWER lobe air. Persistent mild dilatation of small bowel loops in the LEFT central abdomen, similar in appearance to previous CT exam. Air is identified within nondilated loops of colon. There are degenerative changes in both hips. IMPRESSION: 1. Persistent mild dilatation of small bowel loops in the LEFT central abdomen. 2. LEFT LOWER lobe atelectasis or consolidation. Electronically Signed   By: Nolon Nations M.D.   On: 04/28/2021 10:13   DG Abd 2 Views  Result Date: 04/26/2021 CLINICAL DATA:  Follow-up small bowel obstruction. EXAM: ABDOMEN - 2 VIEW COMPARISON:  A2 22 FINDINGS: There is mild residual gaseous distension of multiple small bowel loops, decreased from prior. A small amount of gas is scattered in the colon and rectum. No intraperitoneal free air is identified. Atelectasis is noted in the left lung base. IMPRESSION: Decreased small bowel distension compatible with improving obstruction. Electronically Signed   By: Logan Bores M.D.   On: 04/26/2021 12:45   DG Abd Portable 1V  Result Date:  04/24/2021 CLINICAL DATA:  Small-bowel obstruction. EXAM: PORTABLE ABDOMEN - 1 VIEW COMPARISON:  04/22/2021.  CT 04/21/2021. FINDINGS: Mild-to-moderately distended loops of small bowel with a relative paucity of colonic gas again noted. Findings again consistent with small bowel obstruction. No free air. Ascites most likely present. Pelvic calcifications consistent phleboliths. Degenerative changes lumbar spine and both hips. IMPRESSION: Mild-to-moderate small-bowel distention with a paucity of intra colonic air again noted. Findings again consistent with small bowel obstruction. Ascites most likely present. Electronically Signed   By: Marcello Moores  Register   On: 04/24/2021 11:08   DG Abd Portable 1V-Small Bowel Obstruction Protocol-initial, 8 hr delay  Result Date: 04/22/2021 CLINICAL DATA:  Small bowel obstruction, 8 hour film. EXAM: PORTABLE ABDOMEN - 1 VIEW COMPARISON:  CT yesterday. FINDINGS: Administered enteric contrast has reached the ascending and proximal transverse colon. There is enteric contrast within prominent small bowel in the central abdomen. Left upper quadrant contrast may be residual within small bowel or at the splenic flexure. No evidence of free air. IMPRESSION: Administered enteric contrast has reached the ascending and proximal transverse colon. There is enteric contrast within prominent small bowel in the central abdomen. Left upper quadrant contrast may be residual within small bowel or at the splenic flexure. Electronically Signed   By: Keith Rake M.D.   On: 04/22/2021 17:38   IR IMAGING GUIDED PORT INSERTION  Result Date: 04/26/2021 CLINICAL DATA:  Peritoneal carcinomatosis, unknown primary, access for chemotherapy EXAM: RIGHT INTERNAL JUGULAR SINGLE LUMEN POWER PORT CATHETER INSERTION Date:  04/26/2021 04/26/2021 2:15 pm Radiologist:  M. Daryll Brod, MD Guidance:  Ultrasound and fluoroscopic MEDICATIONS: 1% lidocaine  with epinephrine local ANESTHESIA/SEDATION: Versed 2.0 mg IV;  Fentanyl 100 mcg IV; Moderate Sedation Time:  25 minutes The patient was continuously monitored during the procedure by the interventional radiology nurse under my direct supervision. FLUOROSCOPY TIME:  One minutes, 0 seconds (5 mGy) COMPLICATIONS: None immediate. CONTRAST:  None. PROCEDURE: Informed consent was obtained from the patient following explanation of the procedure, risks, benefits and alternatives. The patient understands, agrees and consents for the procedure. All questions were addressed. A time out was performed. Maximal barrier sterile technique utilized including caps, mask, sterile gowns, sterile gloves, large sterile drape, hand hygiene, and 2% chlorhexidine scrub. Under sterile conditions and local anesthesia, right internal jugular micropuncture venous access was performed. Access was performed with ultrasound. Images were obtained for documentation of the patent right internal jugular vein. A guide wire was inserted followed by a transitional dilator. This allowed insertion of a guide wire and catheter into the IVC. Measurements were obtained from the SVC / RA junction back to the right IJ venotomy site. In the right infraclavicular chest, a subcutaneous pocket was created over the second anterior rib. This was done under sterile conditions and local anesthesia. 1% lidocaine with epinephrine was utilized for this. A 2.5 cm incision was made in the skin. Blunt dissection was performed to create a subcutaneous pocket over the right pectoralis major muscle. The pocket was flushed with saline vigorously. There was adequate hemostasis. The port catheter was assembled and checked for leakage. The port catheter was secured in the pocket with two retention sutures. The tubing was tunneled subcutaneously to the right venotomy site and inserted into the SVC/RA junction through a valved peel-away sheath. Position was confirmed with fluoroscopy. Images were obtained for documentation. The patient tolerated  the procedure well. No immediate complications. Incisions were closed in a two layer fashion with 4 - 0 Vicryl suture. Dermabond was applied to the skin. The port catheter was accessed, blood was aspirated followed by saline and heparin flushes. Needle was removed. A dry sterile dressing was applied. IMPRESSION: Ultrasound and fluoroscopically guided right internal jugular single lumen power port catheter insertion. Tip in the SVC/RA junction. Catheter ready for use. Electronically Signed   By: Jerilynn Mages.  Shick M.D.   On: 04/26/2021 14:22   IR Paracentesis  Result Date: 04/24/2021 INDICATION: Abdominal distention secondary to ascites. Underlying concern for peritoneal carcinomatosis on imaging findings. Request for diagnostic and therapeutic paracentesis. EXAM: ULTRASOUND GUIDED LEFT LOWER QUADRANT PARACENTESIS MEDICATIONS: 1% plain lidocaine, 5 mL COMPLICATIONS: None immediate. PROCEDURE: Informed written consent was obtained from the patient after a discussion of the risks, benefits and alternatives to treatment. A timeout was performed prior to the initiation of the procedure. Initial ultrasound scanning demonstrates a large amount of ascites within the left lower abdominal quadrant. The left lower abdomen was prepped and draped in the usual sterile fashion. 1% lidocaine was used for local anesthesia. Following this, a 19 gauge, 7-cm, Yueh catheter was introduced. An ultrasound image was saved for documentation purposes. The paracentesis was performed. The catheter was removed and a dressing was applied. The patient tolerated the procedure well without immediate post procedural complication. FINDINGS: A total of approximately 2.1 L of clear yellow fluid was removed. Samples were sent to the laboratory as requested by the clinical team. IMPRESSION: Successful ultrasound-guided paracentesis yielding 2.1 liters of peritoneal fluid. Read by: Ascencion Dike PA-C Electronically Signed   By: Michaelle Birks MD   On: 04/24/2021  12:38    ASSESSMENT AND PLAN: 1.  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  Malignant ascites 3.  Protein calorie malnutrition 4.  Mild anemia 5.  Hypertension  -Biopsy results have been discussed with the patient.  We discussed that results are most consistent with a gastric cancer.  We discussed that due to her peritoneal carcinomatosis, this is considered stage IV disease and is not curable.  However, the disease is treatable and goals of treatment would be disease control and to hopefully allow her the ability to eat and prolong her life. -We discussed proceeding with systemic chemotherapy with FOLFOX.  Adverse effects of treatment have been discussed with the patient including but not limited to full neuropathy/cold sensitivity, myelosuppression and increased risk for infection, nausea and vomiting, mucositis, diarrhea, decreased appetite.  She agrees to proceed. -She has TPN running through her Port-A-Cath and will need an additional central line for chemotherapy administration.  I have ordered PICC line placement today. -We will order a CT of the chest to complete her staging work-up. -Will request GI consultation for EGD for evaluation of primary tumor.   LOS: 13 days   [name], DNP, AGPCNP-BC, AOCNP 05/04/21  Addendum  I have seen the patient, examined her. I agree with the assessment and and plan and have edited the notes.   I have reviewed her chart, including lab and images, and biopsy results.  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  Will consult GI for EGD.  CT chest was ordered and done this morning, which showed no thoracic metastasis, did show a small PE, patient is on heparin drip for anticoagulation now.  I recommend systemic chemotherapy with FOLFOX today, pending PD-L1 results, may add nivolumab in near future. Will also check MMR and HER2.  Benefit and potential side  effects discussed with her in the morning, chemo consent was obtained.   Patient is not able to tolerate oral due to small bowel obstruction, she does have bowel movement.  We will continue TPN, we discussed her risk of infection from TPN and chemo.  We also discussed the goal of care in great detail, which is palliative, to improve his quality of life and prolong her life.  If she responds well to chemotherapy, hopefully she will be able to eat again.  Patient is a English as a second language teacher, lives alone, does have relatives to help her if needed.  Her [place] of [place].   Will f/u.   [name]  05/04/2021

## 2021-05-04 NOTE — Progress Notes (Signed)
ANTICOAGULATION CONSULT NOTE  Pharmacy Consult for Heparin Indication: pulmonary embolus  No Known Allergies  Patient Measurements: Height: '5\' 8"'$  (172.7 cm) Weight: 87.1 kg (192 lb 0.3 oz) IBW/kg (Calculated) : 63.9 Heparin Dosing Weight: 87kg  Vital Signs: Temp: 97.7 F (36.5 C) (08/12 2223) Temp Source: Oral (08/12 2223) BP: 117/83 (08/12 2223) Pulse Rate: 89 (08/12 2223)  Labs: Recent Labs    05/02/21 0536 05/03/21 0524 05/04/21 0543 05/04/21 2130  HEPARINUNFRC  --   --   --  0.37  CREATININE 0.85 0.88 0.75  --     Estimated Creatinine Clearance: 77.8 mL/min (by C-G formula based on SCr of 0.75 mg/dL).  Medical History: Past Medical History:  Diagnosis Date   Acid reflux    Hypertension    Medications:  Scheduled:   Chlorhexidine Gluconate Cloth  6 each Topical Daily   FLUOROURACIL (ADRUCIL) CHEMO infusion For Inpatient Use  2,450 mg/m2 (Treatment Plan Recorded) Intravenous Once   pantoprazole (PROTONIX) IV  40 mg Intravenous Daily   sodium chloride flush  10-40 mL Intracatheter Q12H   sodium chloride flush  10-40 mL Intracatheter Q12H   Infusions:   dextrose 5 % and 0.9% NaCl 10 mL/hr at 05/03/21 1652   heparin 1,200 Units/hr (05/04/21 1755)   TPN ADULT (ION) 80 mL/hr at 05/04/21 1755   Assessment: 72 yoF with peritoneal carcinomatosis - now identified as Signet ring Ca, Chemo Folfox to begin 8/12.  NPO since admit for SBO, Diagnostic Lap 8/8, TPN started 8/8.  8/12 CT w/contrast for staging > PE  Last CBC 8/9: Hgb 10.8, Plt wnl Lovenox '40mg'$  SQ qd daily for VTE ppx - discontinued - last dose 8/12 at 10am  8/4 PAC placed - for chemo administration, 8/12 PICC line placement - for TPN, Heparin infusion  HL 0.37 therapeutic on 1200 units/hr Per RN no bleeding or line interruptions  Goal of Therapy:  Heparin level 0.3-0.7 units/ml Monitor platelets by anticoagulation protocol: Yes   Plan:  Continue Heparin infusion at 1200 units/hr Check  confirmatory heparin level in 6 hours Daily CBC ordered, daily Heparin level at steady state  [place] 05/04/2021, 10:37 PM

## 2021-05-04 NOTE — Progress Notes (Addendum)
PROGRESS NOTE    Brenda Stewart  [id number] DOB: [birth year] DOA: 04/21/2021 PCP: Pcp, No      Brief Narrative:  Brenda Stewart is a 70 y/o F with HTN, GERD who presented with abdominal pain and was found to have SBO due to peritoneal carcinomatosis.  General surgery and oncology consulted and following.  Patient transferred to [facility] long hospital to start chemotherapy.  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 -Per oncology.  Malignant ascites -Status post paracentesis. -Concern for reaccumulation of ascites. -Per oncology  Acute renal failure -Secondary to prerenal azotemia.   -Resolved with hydration.    Prolonged QT interval -Repeat EKG with resolution of QT prolongation. -Keep potassium close to 4, magnesium close to 2. -Follow.  Severe protein calorie malnutrition/unintended weight loss -Likely secondary to malignancy -Patient currently n.p.o. secondary to problem #1. -TPN started  Thrombocytosis -Resolved.  Hypomagnesemia -Repleted  Hyponatremia -trend  Iron deficiency anemia/folate deficiency -Anemia panel consistent with iron deficiency anemia and folate deficiency. -Folate level high at 5.0, iron at  18, ferritin on 415.  Vitamin B12 at 952. -Hemoglobin stable  -Transfusion threshold hemoglobin < 7.   ? PE on CT scan with RHS -heparin gtt -echo -check duplex  8/12: PICC line for TPN so port can be used for chemo.   DVT prophylaxis: Heparin>>>> Lovenox Code Status: Full Family Communication: Updated patient.  No family at bedside. Disposition:   Status is: Inpatient  Remains inpatient appropriate because:Inpatient level of care appropriate due to severity of illness  Dispo: The patient is from: Home              Anticipated d/c is to: Home              Patient currently is not medically stable to d/c.-  initiation of chemo-- at least 1 more week in the hospital    Difficult to place patient No       Consultants:  GS:  Oncology: GYN oncology:   Procedures:  CT abdomen pelvis 04/21/2021 Abdominal films 04/22/2021, 04/24/2021, 04/26/2021 Abdominal ultrasound 04/22/2021 Ultrasound-guided left lower quadrant paracentesis--2.1 L of peritoneal fluid removed, per IR, Dr.Mugweru  Right IJ PowerPort placed per IR, Dr. Annamaria Boots 04/26/2021 CT abdomen and pelvis 04/29/2021      Subjective: No pain   Objective: Vitals:   05/03/21 1301 05/03/21 2144 05/04/21 0529 05/04/21 0548  BP: 122/82 134/81 130/84   Pulse: 94 98 91   Resp: '17 18 18   '$ Temp: 98.1 F (36.7 C) 98.3 F (36.8 C) 98 F (36.7 C)   TempSrc: Oral Oral Oral   SpO2: 96% 96% 96%   Weight:    87.1 kg  Height:        Intake/Output Summary (Last 24 hours) at  05/04/2021 0806 Last data filed at 05/04/2021 [id number] Gross per 24 hour  Intake 2237.02 ml  Output 500 ml  Net 1737.02 ml   Filed Weights   05/01/21 0549 05/03/21 0607 05/04/21 0548  Weight: 82.5 kg 84.9 kg 87.1 kg    Examination:   General: Appearance:     Overweight female in no acute distress     Lungs:     respirations unlabored  Heart:    Normal heart rate.    MS:   All extremities are intact.    Neurologic:   Awake, alert, oriented x 3. No  apparent focal neurological           defect.        Data Reviewed: I have personally reviewed following labs and imaging studies  CBC: Recent Labs  Lab 04/28/21 0032 04/29/21 0500 04/30/21 0858 05/01/21 0520 05/01/21 2050  WBC 9.4 11.8* 8.7 13.6* 13.0*  NEUTROABS  --   --   --  11.4*  --   HGB 10.7* 11.0* 10.1* 10.1* 10.8*  HCT 32.5* 33.4* 31.1* 30.2* 33.6*  MCV 91.8 91.8 92.8 91.8 93.9  PLT 289 281 267 275 [id number]    Basic Metabolic Panel: Recent Labs  Lab 04/29/21 0500 04/30/21 0858 05/01/21 0520 05/01/21 2050 05/02/21 0536 05/03/21 0524 05/04/21 0543  NA 134*   < > 137 135 138 136 134*  K 3.8   < > 4.7 4.0 4.0 4.2 4.6  CL 100   < > 102 100 104 102 101  CO2 25   < > 21* '27 26 24 23  '$ GLUCOSE 108*   < > 160* 124* 115* 110* 118*  BUN 9   < > '8 8 8 12 17  '$ CREATININE 0.88   < > 0.86 0.88 0.85 0.88 0.75  CALCIUM 8.3*   < > 8.5* 8.3* 8.4* 8.4* 8.9  MG 2.2  --  1.8  --  1.7 1.8 2.1  PHOS  --   --  4.1  --  3.3 3.8  --    < > = values in this interval not displayed.    GFR: Estimated Creatinine Clearance: 77.8 mL/min (by C-G formula based on SCr of 0.75 mg/dL).  Liver Function Tests: Recent Labs  Lab 05/01/21 0520 05/03/21 0524  AST 13* 13*  ALT 9 7  ALKPHOS 48 50  BILITOT 0.3 0.6  PROT 6.2* 6.1*  ALBUMIN 2.6* 2.5*    CBG: Recent Labs  Lab 05/03/21 0609 05/03/21 1204 05/03/21 2319 05/04/21 0529 05/04/21 0746  GLUCAP 114* 116* 110* 118* 144*     Recent Results (from the past 240 hour(s))  Body fluid culture w Gram Stain     Status: None   Collection Time: 04/24/21  1:55 PM   Specimen: Abdomen; Peritoneal Fluid  Result Value Ref Range Status   Specimen Description PERITONEAL FLUID  Final   Special Requests ABDOMEN  Final   Gram Stain   Final    WBC PRESENT, PREDOMINANTLY MONONUCLEAR NO ORGANISMS SEEN CYTOSPIN SMEAR    Culture   Final    NO GROWTH 3 DAYS Performed at [facility], [place] [address], [place], [place] [address]    Report  Status 04/27/2021 FINAL  Final          Radiology Studies: No results found.      Scheduled Meds:  Chlorhexidine Gluconate Cloth  6 each Topical Daily   enoxaparin (LOVENOX) injection  40 mg Subcutaneous Q24H   pantoprazole (  PROTONIX) IV  40 mg Intravenous Daily   sodium chloride flush  10-40 mL Intracatheter Q12H   Continuous Infusions:  dextrose 5 % and 0.9% NaCl 10 mL/hr at 05/03/21 1652   TPN ADULT (ION) 80 mL/hr at 05/03/21 1730     LOS: 13 days    Time spent: 35 minutes    Geradine Girt, DO [facility]   To contact the attending provider between 7A-7P or the covering provider during after hours 7P-7A, please log into the web site [email or web address] and access using universal [place] password for that web site. If you do not have the password, please call the hospital operator.  05/04/2021, 8:06 AM

## 2021-05-04 NOTE — Progress Notes (Signed)
Chaplain responded to [place] requesting support for Brenda Stewart who might like a little extra support.  Brenda Stewart very stoic, saying she is strong and confident she can handle whatever the [name] puts before her.  Engaged Brenda Stewart in her life story hearing about all the countries she visited while in the [name].  She described life on a ship.  Brenda. Stewart reports have a daughter who lives in [place], [name] and multiple nieces and nephews who live nearby as her support network.  Chaplain offered ministry of care and concern as Brenda. Stewart told of being diagnosed with cancer of the stomach and what the plan to treat her is.  Brenda Stewart expresses trust in her physician and the staff who are caring for her.  Brenda Stewart received a call from her grandson so Chaplain departed.    Chaplain available for follow up as needed.  [place]

## 2021-05-04 NOTE — Progress Notes (Signed)
ANTICOAGULATION CONSULT NOTE  Pharmacy Consult for Heparin Indication: pulmonary embolus  No Known Allergies  Patient Measurements: Height: '5\' 8"'$  (172.7 cm) Weight: 87.1 kg (192 lb 0.3 oz) IBW/kg (Calculated) : 63.9 Heparin Dosing Weight: 87kg  Vital Signs: Temp: 98 F (36.7 C) (08/12 0529) Temp Source: Oral (08/12 0529) BP: 130/84 (08/12 0529) Pulse Rate: 91 (08/12 0529)  Labs: Recent Labs    05/01/21 2050 05/02/21 0536 05/03/21 0524 05/04/21 0543  HGB 10.8*  --   --   --   HCT 33.6*  --   --   --   PLT 300  --   --   --   CREATININE 0.88 0.85 0.88 0.75   Estimated Creatinine Clearance: 77.8 mL/min (by C-G formula based on SCr of 0.75 mg/dL).  Medical History: Past Medical History:  Diagnosis Date   Acid reflux    Hypertension    Medications:  Scheduled:   Chlorhexidine Gluconate Cloth  6 each Topical Daily   FLUOROURACIL (ADRUCIL) CHEMO infusion For Inpatient Use  2,450 mg/m2 (Treatment Plan Recorded) Intravenous Once   leucovorin (WELLCOVORIN) IV infusion  400 mg/m2 (Treatment Plan Recorded) Intravenous Once   oxaliplatin (ELOXATIN) CHEMO IV infusion  85 mg/m2 (Treatment Plan Recorded) Intravenous Once   pantoprazole (PROTONIX) IV  40 mg Intravenous Daily   sodium chloride flush  10-40 mL Intracatheter Q12H   Infusions:   dextrose 5 % and 0.9% NaCl 10 mL/hr at 05/03/21 1652   TPN ADULT (ION) 80 mL/hr at 05/03/21 1730   TPN ADULT (ION)     Assessment: 78 yoF with peritoneal carcinomatosis - now identified as Signet ring Ca, Chemo Folfox to begin 8/12.  NPO since admit for SBO, Diagnostic Lap 8/8, TPN started 8/8.  8/12 CT w/contrast for staging > PE  Last CBC 8/9: Hgb 10.8, Plt wnl Lovenox '40mg'$  SQ qd daily for VTE ppx - discontinued - last dose 8/12 at 10am  8/4 PAC placed - for chemo administration, 8/12 PICC line placement - for TPN, Heparin infusion  Goal of Therapy:  Heparin level 0.3-0.7 units/ml Monitor platelets by anticoagulation protocol:  Yes   Plan:  Slightly reduce Heparin bolus to 2000 units Begin Heparin infusion at 1200 units/hr Check 6 hr Hep level Daily CBC ordered, daily Heparin level at steady state  Minda Ditto PharmD 05/04/2021,1:40 PM

## 2021-05-04 NOTE — Consult Note (Signed)
UNASSIGNED [facility] CONSULT  Reason for Consult: Signet ring cells from omental biopsy Referring Physician: Oncology  Wandra Arthurs HPI: This is a 70 year old female with a PMH of GERD and HTN admitted on 04/21/2021 for complaints of a 30 lbs weight loss, abdominal pain, nausea, and vomiting.  She complained about worsening lower abdominal pain for the past 3-4 months and there was an associated unintentional weight loss of 30 lbs over the past 8-9 months.  Her appetite declined over this interval time period and she had some earl satiety.   Intermittently her abdomen would swell.  Her PCP at the [facility] arranged for her to be evaluated by GI, but this was in mid August.  As her symptoms continued to progress she presented to the ER for further evaluation and treatment.  While in the hospital she was noted to have a high grade partial small bowel obstruction for a peritoneal carcinomatosis.  The working diagnosis was a GYN source for her cancer.  Initial attempts with diagnosis with IR biopsy and ascitic fluid analysis failed to yield a diagnosis.  Dr. Berline Lopes from [place] performed a laproscopy and obtained biopsies of the omental caking.  The pathology was positive for a signet ring cell suggestive of a primary GI source.  The CT scan of the abdomen and pelvis on admission did not suggest a GI source for her malignancy.  A CT of the chest today was positive for a submassive PE.  Past Medical History:  Diagnosis Date   Acid reflux    Hypertension     Past Surgical History:  Procedure Laterality Date   BUNIONECTOMY Bilateral    HAND TENDON SURGERY Bilateral    IR IMAGING GUIDED PORT INSERTION  04/26/2021   IR PARACENTESIS  04/24/2021   LAPAROSCOPY N/A 04/30/2021   Procedure: LAPAROSCOPY DIAGNOSTIC WITH BIOPSIES;  Surgeon: Lafonda Mosses, MD;  Location: [facility];  Service: Gynecology;  Laterality: N/A;   PARTIAL HYSTERECTOMY      History reviewed. No pertinent family history.  Social History:   reports that she has never smoked. She has never used smokeless tobacco. She reports current alcohol use. She reports that she does not use drugs.  Allergies: No Known Allergies  Medications: Scheduled:  Chlorhexidine Gluconate Cloth  6 each Topical Daily   FLUOROURACIL (ADRUCIL) CHEMO infusion For Inpatient Use  2,450 mg/m2 (Treatment Plan Recorded) Intravenous Once   heparin  2,000 Units Intravenous Once   leucovorin (WELLCOVORIN) IV infusion  400 mg/m2 (Treatment Plan Recorded) Intravenous Once   oxaliplatin (ELOXATIN) CHEMO IV infusion  85 mg/m2 (Treatment Plan Recorded) Intravenous Once   pantoprazole (PROTONIX) IV  40 mg Intravenous Daily   sodium chloride flush  10-40 mL Intracatheter Q12H   Continuous:  dextrose 5 % and 0.9% NaCl 10 mL/hr at 05/03/21 1652   heparin     TPN ADULT (ION) 80 mL/hr at 05/03/21 1730   TPN ADULT (ION)      Results for orders placed or performed during the hospital encounter of 04/21/21 (from the past 24 hour(s))  Glucose, capillary     Status: Abnormal   Collection Time: 05/03/21 11:19 PM  Result Value Ref Range   Glucose-Capillary 110 (H) 70 - 99 mg/dL   Comment 1 Notify RN    Comment 2 Document in Chart   Glucose, capillary     Status: Abnormal   Collection Time: 05/04/21  5:29 AM  Result Value Ref Range   Glucose-Capillary 118 (H)  70 - 99 mg/dL   Comment 1 Notify RN    Comment 2 Document in Chart   Basic metabolic panel     Status: Abnormal   Collection Time: 05/04/21  5:43 AM  Result Value Ref Range   Sodium 134 (L) 135 - 145 mmol/L   Potassium 4.6 3.5 - 5.1 mmol/L   Chloride 101 98 - 111 mmol/L   CO2 23 22 - 32 mmol/L   Glucose, Bld 118 (H) 70 - 99 mg/dL   BUN 17 8 - 23 mg/dL   Creatinine, Ser 0.75 0.44 - 1.00 mg/dL   Calcium 8.9 8.9 - 10.3 mg/dL   GFR, Estimated >60 >60 mL/min   Anion gap 10 5 - 15  Magnesium     Status: None   Collection Time: 05/04/21  5:43 AM  Result Value Ref Range   Magnesium 2.1 1.7 - 2.4 mg/dL   Glucose, capillary     Status: Abnormal   Collection Time: 05/04/21  7:46 AM  Result Value Ref Range   Glucose-Capillary 144 (H) 70 - 99 mg/dL  Glucose, capillary     Status: Abnormal   Collection Time: 05/04/21 12:16 PM  Result Value Ref Range   Glucose-Capillary 118 (H) 70 - 99 mg/dL     CT CHEST W CONTRAST  Result Date: 05/04/2021 CLINICAL DATA:  GI stromal cancer staging. Peritoneal carcinomatosis. Right Port-A-Cath tip: SVC. EXAM: CT CHEST WITH CONTRAST TECHNIQUE: Multidetector CT imaging of the chest was performed during intravenous contrast administration. CONTRAST:  1m OMNIPAQUE IOHEXOL 350 MG/ML SOLN COMPARISON:  CT abdomen 04/29/2021 FINDINGS: Cardiovascular: Today's exam is not optimized to assess for pulmonary embolus, but there is suspicion for mild segmental filling defect in the anterior segment right upper lobe and apical segment right upper lobe concerning for pulmonary embolus. Clot burden is small. Right ventricular to left ventricular ratio 1.1. Mediastinum/Nodes: Multiple hypodense thyroid nodules measure up to 2.1 cm in diameter. Recommend thyroid [place](ref: J Am Coll Radiol. 2015 Feb;12(2): 143-50).Distal paraesophageal lymph node 0.8 cm in short axis on image 113 series 2. No overtly pathologic thoracic adenopathy is identified. Lungs/Pleura: Small left pleural effusion (nonspecific for transudative or exudative etiology) with passive atelectasis in the left lower lobe. Subsegmental atelectasis or scarring along the right hemidiaphragm. No worrisome pulmonary nodules. Upper Abdomen: Left upper quadrant ascites along the lesser sac and below the diaphragm with some infiltration of the adjacent omentum. Small focal hypodensities posteriorly along the right hepatic lobe as on recent CT abdomen. Periportal edema. These findings are similar to recent CT abdomen although the amount of left upper quadrant ascites is reduced. There is gas density in the subcutaneous tissues of the left  upper abdomen and lower chest. Musculoskeletal: Degenerative glenohumeral arthropathy, right greater than left. Lower cervical and thoracic spondylosis. IMPRESSION: 1. Suspected segmental filling defects in the anterior segment right upper lobe and apical segment right upper lobe compatible with small pulmonary emboli. Clot burden appears small, although please note that today's examination was not a dedicated CT angiogram and as such might overestimate or underestimate pulmonary embolus. Positive for acute PE with CT evidence of right heart strain (RV/LV Ratio = 1.1) consistent with at least submassive (intermediate risk) PE. The presence of right heart strain has been associated with an increased risk of morbidity and mortality. Please refer to the "PE Focused" order set in EPIC. 2. No compelling findings of metastatic disease to the chest. There is a small left pleural effusion, but no definite enhancement  along the pleura to suggest malignant or exudative etiology at this time. 3. There is some new gas in the soft tissues of the left thoracoabdominal wall, compatible subcutaneous emphysema, probably related to recent laparoscopy or other procedure. 4. Hypodense thyroid nodules up to 2.1 cm in diameter. Recommend thyroid US (ref: J Am Coll Radiol. 2015 Feb;12(2): 143-50). 5. Left upper quadrant ascites, including fluid in the lesser sac. This is reduced compared to the prior CT abdomen of 04/29/2021. 6. Stable appearance of periportal edema and hypodensities along the posterior margin of the right hepatic lobe. Critical Value/emergent results were called by telephone at the time of interpretation on 05/04/2021 at 12:53 pm to provider [facility] , who verbally acknowledged these results. Electronically Signed   By: Van Clines M.D.   On: 05/04/2021 12:53   [place] EKG SITE RITE  Result Date: 05/04/2021 If Site Rite image not attached, placement could not be confirmed due to current cardiac rhythm.    ROS:  As stated above in the HPI otherwise negative.  Blood pressure 117/74, pulse 98, temperature 98.2 F (36.8 C), temperature source Oral, resp. rate 15, height '5\' 8"'$  (1.727 m), weight 87.1 kg, SpO2 98 %.    PE: Unable to perform.  Assessment/Plan: 1) Signet ring cell carcinoma. 2) Submassive PE. 3) SBO.   Two attempts were made to see the patient, but she was in a sterile procedure.  An extensive history was obtained from a review of the chart.  An EGD will be performed to search for a primary source for the metastatic disease.  Plan: 1) EGD on Monday with Dr. Collene Mares.  Jakaylee Sasaki D 05/04/2021, 1:54 PM

## 2021-05-04 NOTE — Progress Notes (Signed)
Peripherally Inserted Central Catheter Placement  The IV Nurse has discussed with the patient and/or persons authorized to consent for the patient, the purpose of this procedure and the potential benefits and risks involved with this procedure.  The benefits include less needle sticks, lab draws from the catheter, and the patient may be discharged home with the catheter. Risks include, but not limited to, infection, bleeding, blood clot (thrombus formation), and puncture of an artery; nerve damage and irregular heartbeat and possibility to perform a PICC exchange if needed/ordered by physician.  Alternatives to this procedure were also discussed.  Bard Power PICC patient education guide, fact sheet on infection prevention and patient information card has been provided to patient /or left at bedside.    PICC Placement Documentation  PICC Double Lumen [id number] PICC Left Basilic 44 cm 0 cm (Active)  Site Assessment Clean;Dry;Intact 05/04/21 1513  Lumen #1 Status Flushed;Saline locked;Blood return noted 05/04/21 1513  Lumen #2 Status Flushed;Saline locked;Blood return noted 05/04/21 1513  Dressing Type Transparent;Securing device 05/04/21 1513  Dressing Status Clean;Dry;Intact 05/04/21 1513  Antimicrobial disc in place? Yes 05/04/21 1513  Safety Lock Not Applicable [id number]  Dressing Change Due 05/11/21 05/04/21 1513       Brenda Stewart 05/04/2021, 3:15 PM

## 2021-05-05 ENCOUNTER — Inpatient Hospital Stay (HOSPITAL_COMMUNITY): Payer: Medicare Other

## 2021-05-05 DIAGNOSIS — K56609 Unspecified intestinal obstruction, unspecified as to partial versus complete obstruction: Secondary | ICD-10-CM | POA: Diagnosis not present

## 2021-05-05 DIAGNOSIS — I2609 Other pulmonary embolism with acute cor pulmonale: Secondary | ICD-10-CM

## 2021-05-05 DIAGNOSIS — I2699 Other pulmonary embolism without acute cor pulmonale: Secondary | ICD-10-CM

## 2021-05-05 LAB — GLUCOSE, CAPILLARY
Glucose-Capillary: 107 mg/dL — ABNORMAL HIGH (ref 70–99)
Glucose-Capillary: 137 mg/dL — ABNORMAL HIGH (ref 70–99)
Glucose-Capillary: 143 mg/dL — ABNORMAL HIGH (ref 70–99)
Glucose-Capillary: 188 mg/dL — ABNORMAL HIGH (ref 70–99)

## 2021-05-05 LAB — CBC
HCT: 29.5 % — ABNORMAL LOW (ref 36.0–46.0)
Hemoglobin: 9.6 g/dL — ABNORMAL LOW (ref 12.0–15.0)
MCH: 30.3 pg (ref 26.0–34.0)
MCHC: 32.5 g/dL (ref 30.0–36.0)
MCV: 93.1 fL (ref 80.0–100.0)
Platelets: 347 10*3/uL (ref 150–400)
RBC: 3.17 MIL/uL — ABNORMAL LOW (ref 3.87–5.11)
RDW: 13.2 % (ref 11.5–15.5)
WBC: 15.6 10*3/uL — ABNORMAL HIGH (ref 4.0–10.5)
nRBC: 0 % (ref 0.0–0.2)

## 2021-05-05 LAB — ECHOCARDIOGRAM COMPLETE
Area-P 1/2: 3.78 cm2
Height: 68 in
S' Lateral: 2.9 cm
Weight: 3030 oz

## 2021-05-05 LAB — BASIC METABOLIC PANEL
Anion gap: 9 (ref 5–15)
BUN: 19 mg/dL (ref 8–23)
CO2: 22 mmol/L (ref 22–32)
Calcium: 9 mg/dL (ref 8.9–10.3)
Chloride: 105 mmol/L (ref 98–111)
Creatinine, Ser: 0.69 mg/dL (ref 0.44–1.00)
GFR, Estimated: >60 mL/min (ref >60)
Glucose, Bld: 169 mg/dL — ABNORMAL HIGH (ref 70–99)
Potassium: 4.7 mmol/L (ref 3.5–5.1)
Sodium: 136 mmol/L (ref 135–145)

## 2021-05-05 LAB — HEPARIN LEVEL (UNFRACTIONATED): Heparin Unfractionated: 0.39 IU/mL (ref 0.30–0.70)

## 2021-05-05 LAB — HEMOGLOBIN A1C
Hgb A1c MFr Bld: 6.4 % — ABNORMAL HIGH (ref 4.8–5.6)
Mean Plasma Glucose: 136.98 mg/dL

## 2021-05-05 LAB — MAGNESIUM: Magnesium: 1.8 mg/dL (ref 1.7–2.4)

## 2021-05-05 MED ORDER — TRAVASOL 10 % IV SOLN: INTRAVENOUS | Status: DC

## 2021-05-05 MED ORDER — TRAVASOL 10 % IV SOLN
INTRAVENOUS | Status: AC
  Filled 2021-05-05: qty 1056

## 2021-05-05 MED ORDER — INSULIN ASPART 100 UNIT/ML IJ SOLN
0.0000 [IU] | Freq: Three times a day (TID) | INTRAMUSCULAR | Status: DC
Start: 2021-05-05 — End: 2021-05-07
  Administered 2021-05-05 – 2021-05-07 (×6): 2 [IU] via SUBCUTANEOUS

## 2021-05-05 NOTE — Progress Notes (Signed)
ANTICOAGULATION CONSULT NOTE  Pharmacy Consult for Heparin Indication: pulmonary embolus  No Known Allergies  Patient Measurements: Height: '5\' 8"'$  (172.7 cm) Weight: 85.9 kg (189 lb 6 oz) IBW/kg (Calculated) : 63.9 Heparin Dosing Weight: 87 kg  Vital Signs: Temp: 98 F (36.7 C) (08/13 0627) Temp Source: Oral (08/13 0627) BP: 129/80 (08/13 0627) Pulse Rate: 90 (08/13 0627)  Labs: Recent Labs    05/03/21 0524 05/04/21 0543 05/04/21 2130 05/05/21 0409  HGB  --   --   --  9.6*  HCT  --   --   --  29.5*  PLT  --   --   --  347  HEPARINUNFRC  --   --  0.37 0.39  CREATININE 0.88 0.75  --  0.69    Estimated Creatinine Clearance: 77.2 mL/min (by C-G formula based on SCr of 0.69 mg/dL).  Medical History: Past Medical History:  Diagnosis Date   Acid reflux    Hypertension    Medications:  Scheduled:   Chlorhexidine Gluconate Cloth  6 each Topical Daily   FLUOROURACIL (ADRUCIL) CHEMO infusion For Inpatient Use  2,450 mg/m2 (Treatment Plan Recorded) Intravenous Once   pantoprazole (PROTONIX) IV  40 mg Intravenous Daily   sodium chloride flush  10-40 mL Intracatheter Q12H   sodium chloride flush  10-40 mL Intracatheter Q12H   Infusions:   dextrose 5 % and 0.9% NaCl 10 mL/hr at 05/03/21 1652   heparin 1,200 Units/hr (05/05/21 0409)   TPN ADULT (ION) 80 mL/hr at 05/05/21 0409   Assessment: 27 yoF with peritoneal carcinomatosis - now identified as Signet ring Ca, Chemo Folfox to begin 8/12.  NPO since admit for SBO, Diagnostic Lap 8/8, TPN started 8/8.  8/12 CT w/contrast for staging > PE  Today, 05/05/21 Confirmatory HL = 0.39 is therapeutic on heparin infusion of 1200 units/hr CBC: Hgb (9.6) low and slightly decreased; Plt WNL Renal function stable Confirmed with RN that heparin infusing at correct rate. No interruptions. No signs of bleeding.   Goal of Therapy:  Heparin level 0.3-0.7 units/ml Monitor platelets by anticoagulation protocol: Yes   Plan:  Continue  heparin infusion at current rate of 1200 units/hr CBC, HL daily while on heparin infusion Monitor for signs of bleeding  Lenis Noon PharmD 05/05/2021,8:03 AM

## 2021-05-05 NOTE — Progress Notes (Signed)
ANTICOAGULATION CONSULT NOTE  Pharmacy Consult for Heparin Indication: pulmonary embolus  No Known Allergies  Patient Measurements: Height: '5\' 8"'$  (172.7 cm) Weight: 87.1 kg (192 lb 0.3 oz) IBW/kg (Calculated) : 63.9 Heparin Dosing Weight: 87kg  Vital Signs: Temp: 97.7 F (36.5 C) (08/12 2223) Temp Source: Oral (08/12 2223) BP: 117/83 (08/12 2223) Pulse Rate: 89 (08/12 2223)  Labs: Recent Labs    05/03/21 0524 05/04/21 0543 05/04/21 2130 05/05/21 0409  HGB  --   --   --  9.6*  HCT  --   --   --  29.5*  PLT  --   --   --  347  HEPARINUNFRC  --   --  0.37 0.39  CREATININE 0.88 0.75  --  0.69    Estimated Creatinine Clearance: 77.8 mL/min (by C-G formula based on SCr of 0.69 mg/dL).  Medical History: Past Medical History:  Diagnosis Date   Acid reflux    Hypertension    Medications:  Scheduled:   Chlorhexidine Gluconate Cloth  6 each Topical Daily   FLUOROURACIL (ADRUCIL) CHEMO infusion For Inpatient Use  2,450 mg/m2 (Treatment Plan Recorded) Intravenous Once   pantoprazole (PROTONIX) IV  40 mg Intravenous Daily   sodium chloride flush  10-40 mL Intracatheter Q12H   sodium chloride flush  10-40 mL Intracatheter Q12H   Infusions:   dextrose 5 % and 0.9% NaCl 10 mL/hr at 05/03/21 1652   heparin 1,200 Units/hr (05/05/21 0409)   TPN ADULT (ION) 80 mL/hr at 05/05/21 0409   Assessment: 24 yoF with peritoneal carcinomatosis - now identified as Signet ring Ca, Chemo Folfox to begin 8/12.  NPO since admit for SBO, Diagnostic Lap 8/8, TPN started 8/8.  8/12 CT w/contrast for staging > PE  Last CBC 8/9: Hgb 10.8, Plt wnl Lovenox '40mg'$  SQ qd daily for VTE ppx - discontinued - last dose 8/12 at 10am  8/4 PAC placed - for chemo administration, 8/12 PICC line placement - for TPN, Heparin infusion  05/05/2021 HL 0.39 therapeutic on 1200 units/hr Hgb 9.3, plts WNL Per RN no bleeding or line interruptions  Goal of Therapy:  Heparin level 0.3-0.7 units/ml Monitor  platelets by anticoagulation protocol: Yes   Plan:  Continue Heparin infusion at 1200 units/hr Daily heparin level and CBC  Dolly Rias RPh 05/05/2021, 5:13 AM

## 2021-05-05 NOTE — Progress Notes (Signed)
Lower extremity venous has been completed.   Preliminary results in CV Proc.   Abram Sander 05/05/2021 11:15 AM

## 2021-05-05 NOTE — Progress Notes (Signed)
2D Echocardiogram has been performed.  Darlina Sicilian M 05/05/2021, 8:54 AM

## 2021-05-05 NOTE — Progress Notes (Signed)
PHARMACY - TOTAL PARENTERAL NUTRITION CONSULT NOTE   Indication: Gyn malignancy, subacute bowel obstruction, NPO  Patient Measurements: Height: '5\' 8"'$  (172.7 cm) Weight: 85.9 kg (189 lb 6 oz) IBW/kg (Calculated) : 63.9 TPN AdjBW (KG): 77 Body mass index is 28.79 kg/m. TBW 77 kg  Assessment: 33 yoF with peritoneal carcinomatosis, primary unknown on admit, unable to obtain perc biopsy with CT > need diag lap with biopsy scheduled for 8/8. Pharmacy consulted to manage TPN on 8/8.  Glucose / Insulin: No hx DM. Goal CBG <180. Range: 118-188; no SSI ordered -Dexamethasone '8mg'$  x1 on 8/8, 10 mg IV x1 on 8/12 Electrolytes: WNL; K (4.7) and CorrCa (10.2) on upper end of normal Renal: SCr and BUN - WNL, stable Hepatic: LFTs WNL, TG 57 (8/9), PreAlb 5.5 (8/9) Intake / Output; MIVF: UOP 600 mL; MIVF @ KVO GI Imaging: -8/7 Abd/Pelvic CT with contrast: peritoneal carcinomatosis, moderate ascites, diffuse omental caking/cystic areas - none amenable to percutaneous biopsy. GI Surgeries / Procedures: Diagnostic Lap with biopsy 8/8 Onc note: likely GI primary - signet ring Ca, will see change in chemo orders, note pt has only one central IV site [facility]) that TPN is infusing into.  Central access: Has PAC placed 8/4, PICC 8/12 TPN start date: 8/8  Nutritional Goals (per RD recommendation on 8/8) kCal: 2100-2300, Protein: 100-115gm , Fluid: > 2L per day   Goal TPN rate 80 mL/hr (provides 106 g of protein and 2108 kcals per day)  Current Nutrition:  NPO except sips with meds, TPN  Plan:  Now: Resume CBG check and mSSI q8h given dexamethasone dose and slightly elevated CBGs  At 1800:  Continue TPN at goal rate of 80 mL/hr Electrolytes in TPN: Reduce K, Remove Ca, Increase Na Na 48mq/L K 236m/L Ca 0 mEq/L Mg 92m53mL Phos 192m79mL Cl:Ac 1:1 Add standard MVI and trace elements to TPN Add folic acid to TPN (ok with Oncology) Continue MIVF at 10 ml/hr; management per MD Monitor TPN labs on  Mon/Thurs, recheck electrolytes with AM labs tomorrow  MaryLenis NoonrmD 05/05/2021,8:11 AM

## 2021-05-05 NOTE — Progress Notes (Signed)
PROGRESS NOTE    Brenda Stewart  [id number] DOB: [birth year] DOA: 04/21/2021 PCP: Pcp, No      Brief Narrative:  Brenda Stewart is a 70 y/o F with HTN, GERD who presented with abdominal pain and was found to have SBO due to peritoneal carcinomatosis.  General surgery and oncology consulted and following.  Patient transferred to [facility] long hospital to start chemotherapy.  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 -Per oncology.  Malignant ascites -Status post paracentesis. -Concern for reaccumulation of ascites. -Per oncology  Left pleural effusion -monitor, may need thoracentesis  ? PE -duplex LE negative -no RHS on echo -heparin gtt  Acute renal failure -Secondary to prerenal azotemia.   -Resolved with hydration.    Prolonged QT interval -Repeat EKG with resolution of QT prolongation. -Keep potassium close to 4, magnesium close to 2. -Follow.  Severe protein calorie malnutrition/unintended weight loss -Likely secondary to malignancy -Patient currently n.p.o. secondary to problem #1. -TPN started  Thrombocytosis -Resolved.  Hypomagnesemia -Repleted  Hyponatremia -trend  Iron  deficiency anemia/folate deficiency -Anemia panel consistent with iron deficiency anemia and folate deficiency. -Folate level high at 5.0, iron at 18, ferritin on 415.  Vitamin B12 at 952. -Hemoglobin stable  -Transfusion threshold hemoglobin < 7.      DVT prophylaxis: Heparin gtt Code Status: Full Family Communication: Updated patient.  No family at bedside. Disposition:   Status is: Inpatient  Remains inpatient appropriate because:Inpatient level of care appropriate due to severity of illness  Dispo: The patient is from: Home              Anticipated d/c is to: Home              Patient currently is not medically stable to d/c.-  initiation of chemo-- at least 1 more week in the hospital    Difficult to place patient No       Consultants:  GS:  Oncology: GYN oncology:   Procedures:  CT abdomen pelvis 04/21/2021 Abdominal films 04/22/2021, 04/24/2021, 04/26/2021 Abdominal ultrasound 04/22/2021 Ultrasound-guided left lower quadrant paracentesis--2.1 L of peritoneal fluid removed, per IR, Dr.Mugweru  Right IJ PowerPort placed per IR, Dr. Annamaria Boots 04/26/2021 CT abdomen and pelvis 04/29/2021      Subjective: No complaints   Objective: Vitals:   05/04/21 0548 05/04/21 1344 05/04/21 2223 05/05/21 0627  BP:  117/74 117/83 129/80  Pulse:  98 89 90  Resp:  '15 17 17  '$ Temp:  98.2 F (36.8 C) 97.7 F (36.5 C) 98 F (36.7 C)  TempSrc:  Oral Oral Oral  SpO2:  98% 96% 96%  Weight: 87.1 kg   85.9 kg  Height:        Intake/Output Summary (Last 24 hours)  at 05/05/2021 1240 Last data filed at 05/05/2021 1046 Gross per 24 hour  Intake 2596.84 ml  Output 800 ml  Net 1796.84 ml   Filed Weights   05/03/21 0607 05/04/21 0548 05/05/21 0627  Weight: 84.9 kg 87.1 kg 85.9 kg    Examination:  General: Appearance:     Overweight female in no acute distress     Lungs:     respirations unlabored  Heart:    Normal heart rate.    MS:   All extremities are intact.    Neurologic:    Awake, alert, oriented x 3. No apparent focal neurological           defect.          Data Reviewed: I have personally reviewed following labs and imaging studies  CBC: Recent Labs  Lab 04/29/21 0500 04/30/21 0858 05/01/21 0520 05/01/21 2050 05/05/21 0409  WBC 11.8* 8.7 13.6* 13.0* 15.6*  NEUTROABS  --   --  11.4*  --   --   HGB 11.0* 10.1* 10.1* 10.8* 9.6*  HCT 33.4* 31.1* 30.2* 33.6* 29.5*  MCV 91.8 92.8 91.8 93.9 93.1  PLT 281 267 275 300 [id number]    Basic Metabolic Panel: Recent Labs  Lab 05/01/21 0520 05/01/21 2050 05/02/21 0536 05/03/21 0524 05/04/21 0543 05/05/21 0409  NA 137 135 138 136 134* 136  K 4.7 4.0 4.0 4.2 4.6 4.7  CL 102 100 104 102 101 105  CO2 21* '27 26 24 23 22  '$ GLUCOSE 160* 124* 115* 110* 118* 169*  BUN '8 8 8 12 17 19  '$ CREATININE 0.86 0.88 0.85 0.88 0.75 0.69  CALCIUM 8.5* 8.3* 8.4* 8.4* 8.9 9.0  MG 1.8  --  1.7 1.8 2.1 1.8  PHOS 4.1  --  3.3 3.8  --   --     GFR: Estimated Creatinine Clearance: 77.2 mL/min (by C-G formula based on SCr of 0.69 mg/dL).  Liver Function Tests: Recent Labs  Lab 05/01/21 0520 05/03/21 0524  AST 13* 13*  ALT 9 7  ALKPHOS 48 50  BILITOT 0.3 0.6  PROT 6.2* 6.1*  ALBUMIN 2.6* 2.5*    CBG: Recent Labs  Lab 05/04/21 0746 05/04/21 1216 05/04/21 1652 05/05/21 0011 05/05/21 0620  GLUCAP 144* 118* 156* 188* 143*     No results found for this or any previous visit (from the past 240 hour(s)).         Radiology Studies: CT CHEST W CONTRAST  Result Date: 05/04/2021 CLINICAL DATA:  GI stromal cancer staging. Peritoneal carcinomatosis. Right Port-A-Cath tip: SVC. EXAM: CT CHEST WITH CONTRAST TECHNIQUE: Multidetector CT imaging of the chest was performed during intravenous contrast administration. CONTRAST:  62m OMNIPAQUE IOHEXOL 350 MG/ML SOLN COMPARISON:  CT abdomen 04/29/2021 FINDINGS: Cardiovascular: Today's exam is not optimized to assess for pulmonary embolus, but there is suspicion for mild  segmental filling defect in the anterior segment right upper lobe and apical segment right upper lobe concerning for pulmonary embolus. Clot burden is small. Right ventricular to left ventricular ratio 1.1. Mediastinum/Nodes: Multiple hypodense thyroid nodules measure up to 2.1 cm in diameter. Recommend thyroid [place](ref: J Am Coll Radiol. 2015 Feb;12(2): 143-50).Distal paraesophageal lymph node 0.8 cm in short axis on image 113 series 2. No overtly pathologic thoracic adenopathy is identified. Lungs/Pleura: Small left pleural effusion (nonspecific for transudative or exudative etiology) with passive atelectasis in the left lower lobe. Subsegmental atelectasis or scarring along the right hemidiaphragm. No worrisome pulmonary nodules. Upper Abdomen: Left  upper quadrant ascites along the lesser sac and below the diaphragm with some infiltration of the adjacent omentum. Small focal hypodensities posteriorly along the right hepatic lobe as on recent CT abdomen. Periportal edema. These findings are similar to recent CT abdomen although the amount of left upper quadrant ascites is reduced. There is gas density in the subcutaneous tissues of the left upper abdomen and lower chest. Musculoskeletal: Degenerative glenohumeral arthropathy, right greater than left. Lower cervical and thoracic spondylosis. IMPRESSION: 1. Suspected segmental filling defects in the anterior segment right upper lobe and apical segment right upper lobe compatible with small pulmonary emboli. Clot burden appears small, although please note that today's examination was not a dedicated CT angiogram and as such might overestimate or underestimate pulmonary embolus. Positive for acute PE with CT evidence of right heart strain (RV/LV Ratio = 1.1) consistent with at least submassive (intermediate risk) PE. The presence of right heart strain has been associated with an increased risk of morbidity and mortality. Please refer to the "PE Focused" order set in  EPIC. 2. No compelling findings of metastatic disease to the chest. There is a small left pleural effusion, but no definite enhancement along the pleura to suggest malignant or exudative etiology at this time. 3. There is some new gas in the soft tissues of the left thoracoabdominal wall, compatible subcutaneous emphysema, probably related to recent laparoscopy or other procedure. 4. Hypodense thyroid nodules up to 2.1 cm in diameter. Recommend thyroid US (ref: J Am Coll Radiol. 2015 Feb;12(2): 143-50). 5. Left upper quadrant ascites, including fluid in the lesser sac. This is reduced compared to the prior CT abdomen of 04/29/2021. 6. Stable appearance of periportal edema and hypodensities along the posterior margin of the right hepatic lobe. Critical Value/emergent results were called by telephone at the time of interpretation on 05/04/2021 at 12:53 pm to provider [facility] , who verbally acknowledged these results. Electronically Signed   By: Van Clines M.D.   On: 05/04/2021 12:53   ECHOCARDIOGRAM COMPLETE  Result Date: 05/05/2021    ECHOCARDIOGRAM REPORT   Patient Name:   Brenda Stewart [facility] Date of Exam: 05/05/2021 Medical Rec #:  [id number]      Height:       68.0 in Accession #:    [id number]     Weight:       189.4 lb Date of Birth:  Dec 26, 1950     BSA:          1.997 m Patient Age:    68 years       BP:           129/80 mmHg Patient Gender: F              HR:           90 bpm. Exam Location:  Inpatient Procedure: 2D Echo, Cardiac Doppler and Color Doppler Indications:    Pulmonary Embolus I26.09  History:        Patient has no prior history of Echocardiogram examinations.                 Risk Factors:Hypertension.  Sonographer:    Darlina Sicilian RDCS Referring Phys: [place]  1. Left ventricular ejection fraction, by estimation, is 60 to 65%. The left ventricle has normal function. The left ventricle has no regional wall motion abnormalities. There is mild left ventricular  hypertrophy of the basal-septal segment. Left ventricular diastolic parameters are consistent with Grade I diastolic dysfunction (impaired  relaxation).  2. Right ventricular systolic function is normal. The right ventricular size is normal.  3. Moderate pleural effusion in the left lateral region.  4. The mitral valve is normal in structure. Trivial mitral valve regurgitation. No evidence of mitral stenosis.  5. The aortic valve is tricuspid. Aortic valve regurgitation is not visualized. Mild aortic valve sclerosis is present, with no evidence of aortic valve stenosis.  6. The inferior vena cava is normal in size with greater than 50% respiratory variability, suggesting right atrial pressure of 3 mmHg. FINDINGS  Left Ventricle: Left ventricular ejection fraction, by estimation, is 60 to 65%. The left ventricle has normal function. The left ventricle has no regional wall motion abnormalities. The left ventricular internal cavity size was normal in size. There is  mild left ventricular hypertrophy of the basal-septal segment. Left ventricular diastolic parameters are consistent with Grade I diastolic dysfunction (impaired relaxation). Right Ventricle: The right ventricular size is normal. Right ventricular systolic function is normal. Left Atrium: Left atrial size was normal in size. Right Atrium: Right atrial size was normal in size. Pericardium: There is no evidence of pericardial effusion. Mitral Valve: The mitral valve is normal in structure. Trivial mitral valve regurgitation. No evidence of mitral valve stenosis. Tricuspid Valve: The tricuspid valve is normal in structure. Tricuspid valve regurgitation is trivial. No evidence of tricuspid stenosis. Aortic Valve: The aortic valve is tricuspid. Aortic valve regurgitation is not visualized. Mild aortic valve sclerosis is present, with no evidence of aortic valve stenosis. Pulmonic Valve: The pulmonic valve was normal in structure. Pulmonic valve regurgitation is  trivial. No evidence of pulmonic stenosis. Aorta: The aortic root is normal in size and structure. Venous: The inferior vena cava is normal in size with greater than 50% respiratory variability, suggesting right atrial pressure of 3 mmHg. IAS/Shunts: No atrial level shunt detected by color flow Doppler. Additional Comments: There is a moderate pleural effusion in the left lateral region.  LEFT VENTRICLE PLAX 2D LVIDd:         3.90 cm  Diastology LVIDs:         2.90 cm  LV e' medial:    6.18 cm/s LV PW:         0.80 cm  LV E/e' medial:  8.6 LV IVS:        1.40 cm  LV e' lateral:   9.03 cm/s LVOT diam:     2.10 cm  LV E/e' lateral: 5.9 LV SV:         61 LV SV Index:   31 LVOT Area:     3.46 cm  RIGHT VENTRICLE RV S prime:     14.40 cm/s TAPSE (M-mode): 1.1 cm LEFT ATRIUM             Index       RIGHT ATRIUM           Index LA diam:        2.70 cm 1.35 cm/m  RA Area:     13.60 cm LA Vol (A2C):   25.1 ml 12.57 ml/m RA Volume:   28.20 ml  14.12 ml/m LA Vol (A4C):   16.0 ml 8.01 ml/m LA Biplane Vol: 20.0 ml 10.02 ml/m  AORTIC VALVE LVOT Vmax:   113.50 cm/s LVOT Vmean:  74.400 cm/s LVOT VTI:    0.177 m  AORTA Ao Root diam: 3.00 cm Ao Asc diam:  3.25 cm MITRAL VALVE MV Area (PHT): 3.78 cm    SHUNTS MV Decel  Time: 201 msec    Systemic VTI:  0.18 m MV E velocity: 53.35 cm/s  Systemic Diam: 2.10 cm MV A velocity: 88.05 cm/s MV E/A ratio:  0.61 Kirk Ruths MD Electronically signed by Kirk Ruths MD Signature Date/Time: 05/05/2021/10:24:12 AM    Final    VAS [place] LOWER EXTREMITY VENOUS (DVT)  Result Date: 05/05/2021  Lower Venous DVT Study Patient Name:  Brenda Stewart [facility]  Date of Exam:   05/05/2021 Medical Rec #: [id number]       Accession #:    [id number] Date of Birth: Feb 04, 1951      Patient Gender: F Patient Age:   37 years Exam Location:  [facility] Procedure:      VAS [place] LOWER EXTREMITY VENOUS (DVT) Referring Phys: Eulogio Bear  --------------------------------------------------------------------------------  Indications: Pulmonary embolism.  Comparison Study: no prior Performing Technologist: Archie Patten RVS  Examination Guidelines: A complete evaluation includes B-mode imaging, spectral Doppler, color Doppler, and power Doppler as needed of all accessible portions of each vessel. Bilateral testing is considered an integral part of a complete examination. Limited examinations for reoccurring indications may be performed as noted. The reflux portion of the exam is performed with the patient in reverse Trendelenburg.  +---------+---------------+---------+-----------+----------+--------------+ RIGHT    CompressibilityPhasicitySpontaneityPropertiesThrombus Aging +---------+---------------+---------+-----------+----------+--------------+ CFV      Full           Yes      Yes                                 +---------+---------------+---------+-----------+----------+--------------+ SFJ      Full                                                        +---------+---------------+---------+-----------+----------+--------------+ FV Prox  Full                                                        +---------+---------------+---------+-----------+----------+--------------+ FV Mid   Full                                                        +---------+---------------+---------+-----------+----------+--------------+ FV DistalFull                                                        +---------+---------------+---------+-----------+----------+--------------+ PFV      Full                                                        +---------+---------------+---------+-----------+----------+--------------+ POP      Full           Yes  Yes                                 +---------+---------------+---------+-----------+----------+--------------+ PTV      Full                                                         +---------+---------------+---------+-----------+----------+--------------+ PERO     Full                                                        +---------+---------------+---------+-----------+----------+--------------+   +---------+---------------+---------+-----------+----------+--------------+ LEFT     CompressibilityPhasicitySpontaneityPropertiesThrombus Aging +---------+---------------+---------+-----------+----------+--------------+ CFV      Full           Yes      Yes                                 +---------+---------------+---------+-----------+----------+--------------+ SFJ      Full                                                        +---------+---------------+---------+-----------+----------+--------------+ FV Prox  Full                                                        +---------+---------------+---------+-----------+----------+--------------+ FV Mid   Full                                                        +---------+---------------+---------+-----------+----------+--------------+ FV DistalFull                                                        +---------+---------------+---------+-----------+----------+--------------+ PFV      Full                                                        +---------+---------------+---------+-----------+----------+--------------+ POP      Full           Yes      Yes                                 +---------+---------------+---------+-----------+----------+--------------+ PTV      Full                                                        +---------+---------------+---------+-----------+----------+--------------+  PERO     Full                                                        +---------+---------------+---------+-----------+----------+--------------+    Summary: BILATERAL: - No evidence of deep vein thrombosis seen in the lower extremities,  bilaterally. -No evidence of popliteal cyst, bilaterally.   *See table(s) above for measurements and observations.    Preliminary    [place] EKG SITE RITE  Result Date: 05/04/2021 If Site Rite image not attached, placement could not be confirmed due to current cardiac rhythm.       Scheduled Meds:  Chlorhexidine Gluconate Cloth  6 each Topical Daily   FLUOROURACIL (ADRUCIL) CHEMO infusion For Inpatient Use  2,450 mg/m2 (Treatment Plan Recorded) Intravenous Once   insulin aspart  0-15 Units Subcutaneous Q8H   pantoprazole (PROTONIX) IV  40 mg Intravenous Daily   sodium chloride flush  10-40 mL Intracatheter Q12H   sodium chloride flush  10-40 mL Intracatheter Q12H   Continuous Infusions:  dextrose 5 % and 0.9% NaCl 10 mL/hr at 05/03/21 1652   heparin 1,200 Units/hr (05/05/21 1046)   TPN ADULT (ION) 80 mL/hr at 05/05/21 0409   TPN ADULT (ION)       LOS: 14 days    Time spent: 25 minutes    Geradine Girt, DO [facility]   To contact the attending provider between 7A-7P or the covering provider during after hours 7P-7A, please log into the web site [email or web address] and access using universal [place] password for that web site. If you do not have the password, please call the hospital operator.  05/05/2021, 12:40 PM

## 2021-05-05 NOTE — Progress Notes (Signed)
HEMATOLOGY-ONCOLOGY PROGRESS NOTE  SUBJECTIVE: Brenda Stewart reports feeling well this morning.  She tolerated chemotherapy without any major complaints.  She denies any nausea, vomiting or abdominal pain.  She did have a bowel movement this morning.  She denies any worsening neuropathy or infusion related complications.  Oncology History  Primary peritoneal carcinomatosis ([place])  04/25/2021 Initial Diagnosis   Primary peritoneal carcinomatosis ([place])   04/27/2021 - 04/27/2021 Chemotherapy          05/04/2021 -  Chemotherapy    Patient is on Treatment Plan: GASTRIC FOLFOX Q14D X 12 CYCLES         PHYSICAL EXAMINATION: ECOG PERFORMANCE STATUS: 2 - Symptomatic, <50% confined to bed  Vitals:   05/04/21 2223 05/05/21 0627  BP: 117/83 129/80  Pulse: 89 90  Resp: 17 17  Temp: 97.7 F (36.5 C) 98 F (36.7 C)  SpO2: 96% 96%   Filed Weights   05/03/21 0607 05/04/21 0548 05/05/21 0627  Weight: 187 lb 2.7 oz (84.9 kg) 192 lb 0.3 oz (87.1 kg) 189 lb 6 oz (85.9 kg)    Intake/Output from previous day: 08/12 0701 - 08/13 0700 In: 2596.8 [I.V.:1456.3; IV Piggyback:1140.6] Out: 600 [Urine:600]   General appearance: Comfortable appearing without any discomfort Head: Normocephalic without any trauma Oropharynx: Mucous membranes are moist and pink without any thrush or ulcers. Eyes: Pupils are equal and round reactive to light. Lymph nodes: No cervical, supraclavicular, inguinal or axillary lymphadenopathy.   Heart:regular rate and rhythm.  S1 and S2 without leg edema. Lung: Clear without any rhonchi or wheezes.  No dullness to percussion. Abdomin: Soft, nontender, nondistended with good bowel sounds.  No hepatosplenomegaly. Musculoskeletal: No joint deformity or effusion.  Full range of motion noted. Neurological: No deficits noted on motor, sensory and deep tendon reflex exam. Skin: No petechial rash or dryness.  Appeared moist.  Psychiatric: Mood and affect appeared  appropriate.   LABORATORY DATA:  I have reviewed the data as listed CMP Latest Ref Rng & Units 05/05/2021 05/04/2021 05/03/2021  Glucose 70 - 99 mg/dL 169(H) 118(H) 110(H)  BUN 8 - 23 mg/dL '19 17 12  '$ Creatinine 0.44 - 1.00 mg/dL 0.69 0.75 0.88  Sodium 135 - 145 mmol/L 136 134(L) 136  Potassium 3.5 - 5.1 mmol/L 4.7 4.6 4.2  Chloride 98 - 111 mmol/L 105 101 102  CO2 22 - 32 mmol/L '22 23 24  '$ Calcium 8.9 - 10.3 mg/dL 9.0 8.9 8.4(L)  Total Protein 6.5 - 8.1 g/dL - - 6.1(L)  Total Bilirubin 0.3 - 1.2 mg/dL - - 0.6  Alkaline Phos 38 - 126 U/L - - 50  AST 15 - 41 U/L - - 13(L)  ALT 0 - 44 U/L - - 7    Lab Results  Component Value Date   WBC 15.6 (H) 05/05/2021   HGB 9.6 (L) 05/05/2021   HCT 29.5 (L) 05/05/2021   MCV 93.1 05/05/2021   PLT 347 05/05/2021   NEUTROABS 11.4 (H) 05/01/2021     ASSESSMENT AND PLAN:   70 year old woman with   1.  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  Etiology is likely a GI primary.  She has received the first day of FOLFOX chemotherapy without any major complications.  Potential complications were reiterated including nausea, vomiting, myelosuppression and worsening neuropathy and cold sensitivity.    2.  Protein calorie malnutrition: She is currently receiving TPN without any complications.   3.  Anemia:  Related to malignancy and chronic illness.  Hemoglobin is adequate and does not require any further support.    4.  Small bowel obstruction: Related to malignant etiology appears to be improving at this time.  5.  Malignant ascites: She is status post paracentesis without any feel it is reaccumulation on exam.  6.  Follow-up in disposition: We will continue to follow during her hospitalization patient does not appear to be ready for discharge quite yet.   35  minutes were dedicated to this visit.  More than 50% of the time was face-to-face and the time  was spent on reviewing laboratory data, imaging studies, addressing complications related to her cancer and cancer therapy and answering questions regarding future plan.    LOS: 14 days   Brenda Button, MD 05/05/21

## 2021-05-06 DIAGNOSIS — K56609 Unspecified intestinal obstruction, unspecified as to partial versus complete obstruction: Secondary | ICD-10-CM | POA: Diagnosis not present

## 2021-05-06 LAB — BASIC METABOLIC PANEL
Anion gap: 8 (ref 5–15)
BUN: 22 mg/dL (ref 8–23)
CO2: 24 mmol/L (ref 22–32)
Calcium: 8.4 mg/dL — ABNORMAL LOW (ref 8.9–10.3)
Chloride: 102 mmol/L (ref 98–111)
Creatinine, Ser: 0.88 mg/dL (ref 0.44–1.00)
GFR, Estimated: >60 mL/min (ref >60)
Glucose, Bld: 123 mg/dL — ABNORMAL HIGH (ref 70–99)
Potassium: 4.3 mmol/L (ref 3.5–5.1)
Sodium: 134 mmol/L — ABNORMAL LOW (ref 135–145)

## 2021-05-06 LAB — CBC
HCT: 29.2 % — ABNORMAL LOW (ref 36.0–46.0)
Hemoglobin: 9.5 g/dL — ABNORMAL LOW (ref 12.0–15.0)
MCH: 30.3 pg (ref 26.0–34.0)
MCHC: 32.5 g/dL (ref 30.0–36.0)
MCV: 93 fL (ref 80.0–100.0)
Platelets: 386 10*3/uL (ref 150–400)
RBC: 3.14 MIL/uL — ABNORMAL LOW (ref 3.87–5.11)
RDW: 13.6 % (ref 11.5–15.5)
WBC: 11.7 10*3/uL — ABNORMAL HIGH (ref 4.0–10.5)
nRBC: 0 % (ref 0.0–0.2)

## 2021-05-06 LAB — GLUCOSE, CAPILLARY
Glucose-Capillary: 123 mg/dL — ABNORMAL HIGH (ref 70–99)
Glucose-Capillary: 125 mg/dL — ABNORMAL HIGH (ref 70–99)
Glucose-Capillary: 128 mg/dL — ABNORMAL HIGH (ref 70–99)

## 2021-05-06 LAB — PHOSPHORUS: Phosphorus: 4.5 mg/dL (ref 2.5–4.6)

## 2021-05-06 LAB — HEPARIN LEVEL (UNFRACTIONATED): Heparin Unfractionated: 0.4 IU/mL (ref 0.30–0.70)

## 2021-05-06 LAB — MAGNESIUM: Magnesium: 1.8 mg/dL (ref 1.7–2.4)

## 2021-05-06 MED ORDER — TRAVASOL 10 % IV SOLN
INTRAVENOUS | Status: AC
  Filled 2021-05-06: qty 1056

## 2021-05-06 NOTE — Progress Notes (Signed)
PHARMACY - TOTAL PARENTERAL NUTRITION CONSULT NOTE   Indication: Gyn malignancy, subacute bowel obstruction, NPO  Patient Measurements: Height: '5\' 8"'$  (172.7 cm) Weight: 83.8 kg (184 lb 11.9 oz) IBW/kg (Calculated) : 63.9 TPN AdjBW (KG): 77 Body mass index is 28.09 kg/m. 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 Pharmacy consulted to manage TPN on 8/8.  Glucose / Insulin: No hx DM. A1c 6.4%. Goal CBG <180.  - Range: 107-137; 4 units SSI/24 hours - Dexamethasone '8mg'$  x1 on 8/8, 10 mg IV x1 on 8/12 Electrolytes: Na (134) slightly low; Phos (4.5) on upper end of normal. All other lytes WNL including CorrCa (9.6) Renal: SCr and BUN - WNL, stable Hepatic: LFTs WNL, TG 57 (8/9), PreAlb 5.5 low (8/9) Intake / Output; MIVF: Strict I/O not measured - UOP: 200 mL + unmeasured. Unmeasured stool x2.  -MIVF stopped on 8/13 GI Imaging: -8/7 Abd/Pelvic CT with contrast: peritoneal carcinomatosis, moderate ascites, diffuse omental caking/cystic areas - none amenable to percutaneous biopsy. GI Surgeries / Procedures: Diagnostic Lap with biopsy 8/8 Onc note: likely GI primary - signet ring Ca, will see change in chemo orders, note pt has only one central IV site [facility]) that TPN is infusing into.  Central access: Has PAC placed 8/4, PICC 8/12 TPN start date: 8/8  Nutritional Goals (per RD recommendation on 8/8) kCal: 2100-2300, Protein: 100-115gm , Fluid: > 2L per day   Goal TPN rate 80 mL/hr (provides 106 g of protein and 2108 kcals per day)  Current Nutrition:  NPO except sips with meds, TPN  Plan:   At 1800:  Continue TPN at goal rate of 80 mL/hr Electrolytes in TPN: Increase Na, Decrease Phos Na 100 mEq/L K 20 mEq/L Ca 0 mEq/L Mg 33mq/L Phos 8 mmol/L Cl:Ac 1:1 Add standard MVI and trace elements to TPN Add folic acid to TPN (ok with Oncology) No MIVF; management per MD. Monitor  TPN labs on Mon/Thurs  MLenis NoonPharmD 05/06/2021,7:26 AM

## 2021-05-06 NOTE — Progress Notes (Signed)
ANTICOAGULATION CONSULT NOTE  Pharmacy Consult for Heparin Indication: pulmonary embolus  No Known Allergies  Patient Measurements: Height: '5\' 8"'$  (172.7 cm) Weight: 83.8 kg (184 lb 11.9 oz) IBW/kg (Calculated) : 63.9 Heparin Dosing Weight: 81 kg  Vital Signs: Temp: 98.1 F (36.7 C) (08/14 0613) Temp Source: Oral (08/14 0613) BP: 109/73 (08/14 [id number]) Pulse Rate: 97 (08/14 0613)  Labs: Recent Labs    05/04/21 0543 05/04/21 2130 05/05/21 0409 05/06/21 0549  HGB  --   --  9.6* 9.5*  HCT  --   --  29.5* 29.2*  PLT  --   --  347 386  HEPARINUNFRC  --  0.37 0.39 0.40  CREATININE 0.75  --  0.69 0.88    Estimated Creatinine Clearance: 69.4 mL/min (by C-G formula based on SCr of 0.88 mg/dL).  Medical History: Past Medical History:  Diagnosis Date   Acid reflux    Hypertension    Medications:  Scheduled:   Chlorhexidine Gluconate Cloth  6 each Topical Daily   FLUOROURACIL (ADRUCIL) CHEMO infusion For Inpatient Use  2,450 mg/m2 (Treatment Plan Recorded) Intravenous Once   insulin aspart  0-15 Units Subcutaneous Q8H   pantoprazole (PROTONIX) IV  40 mg Intravenous Daily   sodium chloride flush  10-40 mL Intracatheter Q12H   sodium chloride flush  10-40 mL Intracatheter Q12H   Infusions:   heparin 1,200 Units/hr (05/05/21 2235)   TPN ADULT (ION) 80 mL/hr at 05/06/21 0214   Assessment: 16 yoF with peritoneal carcinomatosis - now identified as Signet ring Ca, Chemo Folfox to begin 8/12.  NPO since admit for SBO, Diagnostic Lap 8/8, TPN started 8/8.  8/12 CT w/contrast for staging > PE. Pharmacy consulted to dose/monitor heparin drip  Today, 05/06/21 HL = 0.40 remains therapeutic on heparin infusion of 1200 units/hr CBC: Hgb (9.5) low but stable; Plt WNL Note: FOLFOX 8/12, anticipate cell counts to drop s/p chemo Renal function stable Confirmed with RN that heparin infusing at correct rate. No interruptions. No signs of bleeding.   Goal of Therapy:  Heparin level  0.3-0.7 units/ml Monitor platelets by anticoagulation protocol: Yes   Plan:  Continue heparin infusion at current rate of 1200 units/hr CBC, HL daily while on heparin infusion Monitor for signs of bleeding  Lenis Noon PharmD 05/06/2021,7:23 AM

## 2021-05-06 NOTE — Progress Notes (Signed)
PROGRESS NOTE    Brenda Stewart  [id number] DOB: 20-Jun-1951 DOA: 04/21/2021 PCP: Pcp, No      Brief Narrative:  Brenda Stewart is a 70 y/o F with HTN, GERD who presented with abdominal pain and was found to have SBO due to peritoneal carcinomatosis.  Patient transferred to [facility] long hospital to start chemotherapy.  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  Suspected GI source so EGD pending for Monday AM.  Nutrition through TPN.  Incidentally found PE on CT scan.  Heparin started.     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 GI source -Status post paracentesis- cytology unrevealing -Patient status post port placement 04/26/2021. -FOLFOX s/p 1 cycle -GI consulted for EGD in AM -Per oncology.  Malignant ascites -Status post paracentesis. -Concern for reaccumulation of ascites. -Per oncology  Left pleural effusion -monitor, may need thoracentesis if worsens  Incidental PE -duplex LE negative -no RHS on echo -heparin gtt  Acute renal failure -Secondary to prerenal azotemia.   -Resolved with hydration.    Prolonged QT interval -Repeat EKG with resolution of QT prolongation. -Keep potassium close to 4, magnesium close to 2. -Follow.  Severe protein calorie  malnutrition/unintended weight loss -Likely secondary to malignancy -Patient currently n.p.o. secondary to problem #1- defer to oncology PO intake -TPN started  Thrombocytosis -Resolved.  Hypomagnesemia -Repleted  Hyponatremia -trend  Iron deficiency anemia/folate deficiency -Anemia panel consistent with iron deficiency anemia and folate deficiency. -Folate level high at 5.0, iron at 18, ferritin on 415.  Vitamin B12 at 952. -Hemoglobin stable  -Transfusion threshold hemoglobin < 7.      DVT prophylaxis: Heparin gtt Code Status: Full Family Communication: Updated patient.  No family at bedside. Disposition:   Status is: Inpatient  Remains inpatient appropriate because:Inpatient level of care appropriate due to severity of illness  Dispo: The patient is from: Home              Anticipated d/c is to: Home              Patient currently is not medically stable to d/c.-  initiation of chemo-- ned EGD   Difficult to place patient No       Consultants:  GS:  Oncology: GYN oncology:  GI  Procedures:  CT abdomen pelvis 04/21/2021 Abdominal films 04/22/2021, 04/24/2021, 04/26/2021 Abdominal ultrasound 04/22/2021 Ultrasound-guided left lower quadrant paracentesis--2.1 L of peritoneal fluid removed, per IR, Dr.Mugweru  Right IJ PowerPort placed per IR, Dr. Annamaria Boots 04/26/2021 CT abdomen and pelvis 04/29/2021      Subjective: Tired this AM   Objective: Vitals:   05/05/21 2045 05/06/21 0500 05/06/21 0613 05/06/21 0748  BP: 120/75  109/73 118/71  Pulse: 91  97 86  Resp: '18  20 18  '$ Temp: 97.9 F (36.6 C)  98.1 F (36.7 C) 98.3 F (36.8 C)  TempSrc: Oral  Oral   SpO2: 98%  97% 98%  Weight:  83.8 kg    Height:        Intake/Output Summary (Last 24 hours) at 05/06/2021 1213 Last data filed at 05/06/2021 0310 Gross per 24 hour  Intake 1573.91 ml  Output --  Net 1573.91 ml   Filed Weights   05/04/21 0548 05/05/21 0627 05/06/21 0500  Weight: 87.1 kg 85.9 kg 83.8 kg     Examination:   General: Appearance:     Overweight female in no acute distress     Lungs:     respirations unlabored  Heart:    Normal heart rate.    MS:   All extremities are intact.    Neurologic:   Awake, alert, oriented x 3       Data Reviewed: I have personally reviewed following labs and imaging studies  CBC: Recent Labs  Lab 04/30/21 0858 05/01/21 0520 05/01/21 2050 05/05/21 0409 05/06/21 0549  WBC 8.7 13.6* 13.0* 15.6* 11.7*  NEUTROABS  --  11.4*  --   --   --   HGB 10.1* 10.1* 10.8* 9.6* 9.5*  HCT 31.1* 30.2* 33.6* 29.5* 29.2*  MCV 92.8 91.8 93.9 93.1 93.0  PLT 267 275 300 347 [id number]    Basic Metabolic Panel: Recent Labs  Lab 05/01/21 0520 05/01/21 2050 05/02/21 0536 05/03/21 0524 05/04/21 0543 05/05/21 0409 05/06/21 0549  NA 137   < > 138 136 134* 136 134*  K 4.7   < > 4.0 4.2 4.6 4.7 4.3  CL 102   < > 104 102 101 105 102  CO2 21*   < > '26 24 23 22 24  '$ GLUCOSE 160*   < > 115* 110* 118* 169* 123*  BUN 8   < > '8 12 17 19 22  '$ CREATININE 0.86   < > 0.85 0.88 0.75 0.69 0.88  CALCIUM 8.5*   < > 8.4* 8.4* 8.9 9.0 8.4*  MG 1.8  --  1.7 1.8 2.1 1.8 1.8  PHOS 4.1  --  3.3 3.8  --   --  4.5   < > = values in this interval not displayed.    GFR: Estimated Creatinine Clearance: 69.4 mL/min (by C-G formula based on SCr of 0.88 mg/dL).  Liver Function Tests: Recent Labs  Lab 05/01/21 0520 05/03/21 0524  AST 13* 13*  ALT 9 7  ALKPHOS 48 50  BILITOT 0.3 0.6  PROT 6.2* 6.1*  ALBUMIN 2.6* 2.5*    CBG: Recent Labs  Lab 05/05/21 0620 05/05/21 0900 05/05/21 1612 05/05/21 2354 05/06/21 0744  GLUCAP 143* 137* 107* 123* 128*     No results found for this or any previous visit (from the past 240 hour(s)).         Radiology Studies: ECHOCARDIOGRAM COMPLETE  Result Date: 05/05/2021    ECHOCARDIOGRAM REPORT   Patient Name:   Brenda Stewart [facility] Date of Exam: 05/05/2021 Medical Rec #:  [id number]      Height:       68.0 in Accession #:     [id number]     Weight:       189.4 lb Date of Birth:  [birth year]     BSA:          1.997 m Patient Age:    34 years       BP:           129/80  mmHg Patient Gender: F              HR:           90 bpm. Exam Location:  Inpatient Procedure: 2D Echo, Cardiac Doppler and Color Doppler Indications:    Pulmonary Embolus I26.09  History:        Patient has no prior history of Echocardiogram examinations.                 Risk Factors:Hypertension.  Sonographer:    Darlina Sicilian RDCS Referring Phys: [place]  1. Left ventricular ejection fraction, by estimation, is 60 to 65%. The left ventricle has normal function. The left ventricle has no regional wall motion abnormalities. There is mild left ventricular hypertrophy of the basal-septal segment. Left ventricular diastolic parameters are consistent with Grade I diastolic dysfunction (impaired relaxation).  2. Right ventricular systolic function is normal. The right ventricular size is normal.  3. Moderate pleural effusion in the left lateral region.  4. The mitral valve is normal in structure. Trivial mitral valve regurgitation. No evidence of mitral stenosis.  5. The aortic valve is tricuspid. Aortic valve regurgitation is not visualized. Mild aortic valve sclerosis is present, with no evidence of aortic valve stenosis.  6. The inferior vena cava is normal in size with greater than 50% respiratory variability, suggesting right atrial pressure of 3 mmHg. FINDINGS  Left Ventricle: Left ventricular ejection fraction, by estimation, is 60 to 65%. The left ventricle has normal function. The left ventricle has no regional wall motion abnormalities. The left ventricular internal cavity size was normal in size. There is  mild left ventricular hypertrophy of the basal-septal segment. Left ventricular diastolic parameters are consistent with Grade I diastolic dysfunction (impaired relaxation). Right Ventricle: The right ventricular size is normal. Right  ventricular systolic function is normal. Left Atrium: Left atrial size was normal in size. Right Atrium: Right atrial size was normal in size. Pericardium: There is no evidence of pericardial effusion. Mitral Valve: The mitral valve is normal in structure. Trivial mitral valve regurgitation. No evidence of mitral valve stenosis. Tricuspid Valve: The tricuspid valve is normal in structure. Tricuspid valve regurgitation is trivial. No evidence of tricuspid stenosis. Aortic Valve: The aortic valve is tricuspid. Aortic valve regurgitation is not visualized. Mild aortic valve sclerosis is present, with no evidence of aortic valve stenosis. Pulmonic Valve: The pulmonic valve was normal in structure. Pulmonic valve regurgitation is trivial. No evidence of pulmonic stenosis. Aorta: The aortic root is normal in size and structure. Venous: The inferior vena cava is normal in size with greater than 50% respiratory variability, suggesting right atrial pressure of 3 mmHg. IAS/Shunts: No atrial level shunt detected by color flow Doppler. Additional Comments: There is a moderate pleural effusion in the left lateral region.  LEFT VENTRICLE PLAX 2D LVIDd:         3.90 cm  Diastology LVIDs:         2.90 cm  LV e' medial:    6.18 cm/s LV PW:         0.80 cm  LV E/e' medial:  8.6 LV IVS:        1.40 cm  LV e' lateral:   9.03 cm/s LVOT diam:     2.10 cm  LV E/e' lateral: 5.9 LV SV:         61 LV SV Index:   31 LVOT Area:     3.46 cm  RIGHT VENTRICLE RV S  prime:     14.40 cm/s TAPSE (M-mode): 1.1 cm LEFT ATRIUM             Index       RIGHT ATRIUM           Index LA diam:        2.70 cm 1.35 cm/m  RA Area:     13.60 cm LA Vol (A2C):   25.1 ml 12.57 ml/m RA Volume:   28.20 ml  14.12 ml/m LA Vol (A4C):   16.0 ml 8.01 ml/m LA Biplane Vol: 20.0 ml 10.02 ml/m  AORTIC VALVE LVOT Vmax:   113.50 cm/s LVOT Vmean:  74.400 cm/s LVOT VTI:    0.177 m  AORTA Ao Root diam: 3.00 cm Ao Asc diam:  3.25 cm MITRAL VALVE MV Area (PHT): 3.78 cm     SHUNTS MV Decel Time: 201 msec    Systemic VTI:  0.18 m MV E velocity: 53.35 cm/s  Systemic Diam: 2.10 cm MV A velocity: 88.05 cm/s MV E/A ratio:  0.61 Kirk Ruths MD Electronically signed by Kirk Ruths MD Signature Date/Time: 05/05/2021/10:24:12 AM    Final    VAS [place] LOWER EXTREMITY VENOUS (DVT)  Result Date: 05/05/2021  Lower Venous DVT Study Patient Name:  Brenda Stewart [facility]  Date of Exam:   05/05/2021 Medical Rec #: [id number]       Accession #:    [id number] Date of Birth: 17-Mar-1951      Patient Gender: F Patient Age:   18 years Exam Location:  [facility] Procedure:      VAS [place] LOWER EXTREMITY VENOUS (DVT) Referring Phys: Eulogio Bear --------------------------------------------------------------------------------  Indications: Pulmonary embolism.  Comparison Study: no prior Performing Technologist: Archie Patten RVS  Examination Guidelines: A complete evaluation includes B-mode imaging, spectral Doppler, color Doppler, and power Doppler as needed of all accessible portions of each vessel. Bilateral testing is considered an integral part of a complete examination. Limited examinations for reoccurring indications may be performed as noted. The reflux portion of the exam is performed with the patient in reverse Trendelenburg.  +---------+---------------+---------+-----------+----------+--------------+ RIGHT    CompressibilityPhasicitySpontaneityPropertiesThrombus Aging +---------+---------------+---------+-----------+----------+--------------+ CFV      Full           Yes      Yes                                 +---------+---------------+---------+-----------+----------+--------------+ SFJ      Full                                                        +---------+---------------+---------+-----------+----------+--------------+ FV Prox  Full                                                        +---------+---------------+---------+-----------+----------+--------------+  FV Mid   Full                                                        +---------+---------------+---------+-----------+----------+--------------+  FV DistalFull                                                        +---------+---------------+---------+-----------+----------+--------------+ PFV      Full                                                        +---------+---------------+---------+-----------+----------+--------------+ POP      Full           Yes      Yes                                 +---------+---------------+---------+-----------+----------+--------------+ PTV      Full                                                        +---------+---------------+---------+-----------+----------+--------------+ PERO     Full                                                        +---------+---------------+---------+-----------+----------+--------------+   +---------+---------------+---------+-----------+----------+--------------+ LEFT     CompressibilityPhasicitySpontaneityPropertiesThrombus Aging +---------+---------------+---------+-----------+----------+--------------+ CFV      Full           Yes      Yes                                 +---------+---------------+---------+-----------+----------+--------------+ SFJ      Full                                                        +---------+---------------+---------+-----------+----------+--------------+ FV Prox  Full                                                        +---------+---------------+---------+-----------+----------+--------------+ FV Mid   Full                                                        +---------+---------------+---------+-----------+----------+--------------+ FV DistalFull                                                        +---------+---------------+---------+-----------+----------+--------------+   PFV      Full                                                         +---------+---------------+---------+-----------+----------+--------------+ POP      Full           Yes      Yes                                 +---------+---------------+---------+-----------+----------+--------------+ PTV      Full                                                        +---------+---------------+---------+-----------+----------+--------------+ PERO     Full                                                        +---------+---------------+---------+-----------+----------+--------------+     Summary: BILATERAL: - No evidence of deep vein thrombosis seen in the lower extremities, bilaterally. -No evidence of popliteal cyst, bilaterally.   *See table(s) above for measurements and observations. Electronically signed by Harold Barban MD on 05/05/2021 at 1:26:43 PM.    Final         Scheduled Meds:  Chlorhexidine Gluconate Cloth  6 each Topical Daily   FLUOROURACIL (ADRUCIL) CHEMO infusion For Inpatient Use  2,450 mg/m2 (Treatment Plan Recorded) Intravenous Once   insulin aspart  0-15 Units Subcutaneous Q8H   pantoprazole (PROTONIX) IV  40 mg Intravenous Daily   sodium chloride flush  10-40 mL Intracatheter Q12H   sodium chloride flush  10-40 mL Intracatheter Q12H   Continuous Infusions:  heparin 1,200 Units/hr (05/06/21 0854)   TPN ADULT (ION) 80 mL/hr at 05/06/21 0214   TPN ADULT (ION)       LOS: 15 days    Time spent: 25 minutes    Geradine Girt, DO [facility]   To contact the attending provider between 7A-7P or the covering provider during after hours 7P-7A, please log into the web site [email or web address] and access using universal [place] password for that web site. If you do not have the password, please call the hospital operator.  05/06/2021, 12:13 PM

## 2021-05-06 NOTE — Progress Notes (Signed)
HEMATOLOGY-ONCOLOGY PROGRESS NOTE  SUBJECTIVE: Ms. Brenda Stewart feels more fatigued and tired this morning but overall no other complaints.  She denies any nausea, vomiting or abdominal pain.  She denies any distention or diarrhea.  She is moving her bowels more regularly at this time.  She denies any neuropathy or cold sensitivity.  Oncology History  Primary peritoneal carcinomatosis ([place])  04/25/2021 Initial Diagnosis   Primary peritoneal carcinomatosis ([place])   04/27/2021 - 04/27/2021 Chemotherapy          05/04/2021 -  Chemotherapy    Patient is on Treatment Plan: GASTRIC FOLFOX Q14D X 12 CYCLES         PHYSICAL EXAMINATION: ECOG PERFORMANCE STATUS: 2 - Symptomatic, <50% confined to bed  Vitals:   05/06/21 0613 05/06/21 0748  BP: 109/73 118/71  Pulse: 97 86  Resp: 20 18  Temp: 98.1 F (36.7 C) 98.3 F (36.8 C)  SpO2: 97% 98%   Filed Weights   05/04/21 0548 05/05/21 0627 05/06/21 0500  Weight: 192 lb 0.3 oz (87.1 kg) 189 lb 6 oz (85.9 kg) 184 lb 11.9 oz (83.8 kg)    Intake/Output from previous day: 08/13 0701 - 08/14 0700 In: 1573.9 [I.V.:1022.7; IV Piggyback:551.2] Out: 200 [Urine:200]    General appearance: Alert, awake without any distress. Head: Atraumatic without abnormalities Oropharynx: Without any thrush or ulcers. Eyes: No scleral icterus. Lymph nodes: No lymphadenopathy noted in the cervical, supraclavicular, or axillary nodes Heart:regular rate and rhythm, without any murmurs or gallops.   Lung: Clear to auscultation without any rhonchi, wheezes or dullness to percussion. Abdomin: Soft, nontender without any shifting dullness or ascites. Musculoskeletal: No clubbing or cyanosis. Neurological: No motor or sensory deficits. Skin: No rashes or lesions.     LABORATORY DATA:  I have reviewed the data as listed CMP Latest Ref Rng & Units 05/06/2021 05/05/2021 05/04/2021  Glucose 70 - 99 mg/dL 123(H) 169(H) 118(H)  BUN 8 - 23 mg/dL '22 19 17  '$ Creatinine 0.44 -  1.00 mg/dL 0.88 0.69 0.75  Sodium 135 - 145 mmol/L 134(L) 136 134(L)  Potassium 3.5 - 5.1 mmol/L 4.3 4.7 4.6  Chloride 98 - 111 mmol/L 102 105 101  CO2 22 - 32 mmol/L '24 22 23  '$ Calcium 8.9 - 10.3 mg/dL 8.4(L) 9.0 8.9  Total Protein 6.5 - 8.1 g/dL - - -  Total Bilirubin 0.3 - 1.2 mg/dL - - -  Alkaline Phos 38 - 126 U/L - - -  AST 15 - 41 U/L - - -  ALT 0 - 44 U/L - - -    Lab Results  Component Value Date   WBC 11.7 (H) 05/06/2021   HGB 9.5 (L) 05/06/2021   HCT 29.2 (L) 05/06/2021   MCV 93.0 05/06/2021   PLT 386 05/06/2021   NEUTROABS 11.4 (H) 05/01/2021     ASSESSMENT AND PLAN:   70 year old woman with   1.  Advanced GI malignancy presented with peritoneal carcinomatosis.  She is status post the first cycle of FOLFOX without any major complications at this time.  Subsequent cycles may be delivered as an outpatient if she is discharged in the near future.  No immediate complications noted from her current cycle.     2.  Protein calorie malnutrition: Most of her nutrition is administered via TPN.  No oral intake yet.   3.  Anemia: Hemoglobin is adequate and does not require any repeat transfusion.  4.  Small bowel obstruction: Improving at this time with more regular bowel habits.  5.  Malignant ascites: She does not require any repeat paracentesis at this time.  6.  Follow-up in disposition: She does not appear to ready for discharge at this time.  We will continue to follow during hospitalization.   25 minutes were spent on this encounter.  50% of time was face-to-face and dedicated to reviewing her disease status, addressing complications related to cancer, cancer therapy and outlining future plan of care.   LOS: 15 days   Zola Button, MD 05/06/21

## 2021-05-07 ENCOUNTER — Inpatient Hospital Stay (HOSPITAL_COMMUNITY): Payer: Medicare Other | Admitting: Anesthesiology

## 2021-05-07 ENCOUNTER — Encounter (HOSPITAL_COMMUNITY)
Admission: EM | Disposition: A | Discharge status: Home Health | Payer: Self-pay | Source: Home / Self Care | Attending: Internal Medicine

## 2021-05-07 ENCOUNTER — Encounter (HOSPITAL_COMMUNITY): Payer: Self-pay | Admitting: Internal Medicine

## 2021-05-07 DIAGNOSIS — K56609 Unspecified intestinal obstruction, unspecified as to partial versus complete obstruction: Secondary | ICD-10-CM | POA: Diagnosis not present

## 2021-05-07 HISTORY — PX: ESOPHAGEAL BRUSHING: SHX6842

## 2021-05-07 HISTORY — PX: BIOPSY: SHX5522

## 2021-05-07 HISTORY — PX: ESOPHAGOGASTRODUODENOSCOPY (EGD) WITH PROPOFOL: SHX5813

## 2021-05-07 LAB — DIFFERENTIAL
Abs Immature Granulocytes: 0.09 10*3/uL — ABNORMAL HIGH (ref 0.00–0.07)
Basophils Absolute: 0 10*3/uL (ref 0.0–0.1)
Basophils Relative: 0 %
Eosinophils Absolute: 0.2 10*3/uL (ref 0.0–0.5)
Eosinophils Relative: 2 %
Immature Granulocytes: 1 %
Lymphocytes Relative: 21 %
Lymphs Abs: 1.7 10*3/uL (ref 0.7–4.0)
Monocytes Absolute: 0.2 10*3/uL (ref 0.1–1.0)
Monocytes Relative: 3 %
Neutro Abs: 5.8 10*3/uL (ref 1.7–7.7)
Neutrophils Relative %: 73 %

## 2021-05-07 LAB — PHOSPHORUS: Phosphorus: 3.5 mg/dL (ref 2.5–4.6)

## 2021-05-07 LAB — COMPREHENSIVE METABOLIC PANEL
ALT: 11 U/L (ref 0–44)
AST: 18 U/L (ref 15–41)
Albumin: 2.4 g/dL — ABNORMAL LOW (ref 3.5–5.0)
Alkaline Phosphatase: 56 U/L (ref 38–126)
Anion gap: 6 (ref 5–15)
BUN: 24 mg/dL — ABNORMAL HIGH (ref 8–23)
CO2: 25 mmol/L (ref 22–32)
Calcium: 8.5 mg/dL — ABNORMAL LOW (ref 8.9–10.3)
Chloride: 104 mmol/L (ref 98–111)
Creatinine, Ser: 0.76 mg/dL (ref 0.44–1.00)
GFR, Estimated: >60 mL/min (ref >60)
Glucose, Bld: 131 mg/dL — ABNORMAL HIGH (ref 70–99)
Potassium: 4 mmol/L (ref 3.5–5.1)
Sodium: 135 mmol/L (ref 135–145)
Total Bilirubin: 0.4 mg/dL (ref 0.3–1.2)
Total Protein: 6.2 g/dL — ABNORMAL LOW (ref 6.5–8.1)

## 2021-05-07 LAB — GLUCOSE, CAPILLARY
Glucose-Capillary: 126 mg/dL — ABNORMAL HIGH (ref 70–99)
Glucose-Capillary: 133 mg/dL — ABNORMAL HIGH (ref 70–99)

## 2021-05-07 LAB — CBC
HCT: 27.9 % — ABNORMAL LOW (ref 36.0–46.0)
Hemoglobin: 9 g/dL — ABNORMAL LOW (ref 12.0–15.0)
MCH: 30.1 pg (ref 26.0–34.0)
MCHC: 32.3 g/dL (ref 30.0–36.0)
MCV: 93.3 fL (ref 80.0–100.0)
Platelets: 354 10*3/uL (ref 150–400)
RBC: 2.99 MIL/uL — ABNORMAL LOW (ref 3.87–5.11)
RDW: 13.6 % (ref 11.5–15.5)
WBC: 8 10*3/uL (ref 4.0–10.5)
nRBC: 0 % (ref 0.0–0.2)

## 2021-05-07 LAB — HEPARIN LEVEL (UNFRACTIONATED): Heparin Unfractionated: 0.21 IU/mL — ABNORMAL LOW (ref 0.30–0.70)

## 2021-05-07 LAB — PREALBUMIN: Prealbumin: 10.8 mg/dL — ABNORMAL LOW (ref 18–38)

## 2021-05-07 LAB — MAGNESIUM: Magnesium: 1.8 mg/dL (ref 1.7–2.4)

## 2021-05-07 LAB — TRIGLYCERIDES: Triglycerides: 44 mg/dL (ref <150)

## 2021-05-07 SURGERY — ESOPHAGOGASTRODUODENOSCOPY (EGD) WITH PROPOFOL
Anesthesia: Monitor Anesthesia Care

## 2021-05-07 MED ORDER — DEXTROSE 70 % IV SOLN
INTRAVENOUS | Status: AC
  Filled 2021-05-07: qty 1056

## 2021-05-07 MED ORDER — PANTOPRAZOLE SODIUM 40 MG IV SOLR
40.0000 mg | Freq: Two times a day (BID) | INTRAVENOUS | Status: DC
  Administered 2021-05-07 – 2021-05-08 (×4): 40 mg via INTRAVENOUS
  Filled 2021-05-07 (×3): qty 40

## 2021-05-07 MED ORDER — PROPOFOL 10 MG/ML IV BOLUS
INTRAVENOUS | Status: DC | PRN
  Administered 2021-05-07 (×2): 20 mg via INTRAVENOUS

## 2021-05-07 MED ORDER — LIDOCAINE 2% (20 MG/ML) 5 ML SYRINGE
INTRAMUSCULAR | Status: DC | PRN
  Administered 2021-05-07: 100 mg via INTRAVENOUS

## 2021-05-07 MED ORDER — HEPARIN (PORCINE) 25000 UT/250ML-% IV SOLN
1550.0000 [IU]/h | INTRAVENOUS | Status: AC
  Administered 2021-05-08 (×2): 1550 [IU]/h via INTRAVENOUS
  Filled 2021-05-07 (×3): qty 250

## 2021-05-07 MED ORDER — LACTATED RINGERS IV SOLN
INTRAVENOUS | Status: DC
Start: 2021-05-07 — End: 2021-05-09
  Administered 2021-05-07: 1000 mL via INTRAVENOUS

## 2021-05-07 MED ORDER — HEPARIN (PORCINE) 25000 UT/250ML-% IV SOLN
1350.0000 [IU]/h | INTRAVENOUS | Status: DC
  Filled 2021-05-07: qty 250

## 2021-05-07 MED ORDER — PROPOFOL 500 MG/50ML IV EMUL
INTRAVENOUS | Status: DC | PRN
  Administered 2021-05-07: 125 ug/kg/min via INTRAVENOUS

## 2021-05-07 MED ORDER — PROPOFOL 500 MG/50ML IV EMUL
INTRAVENOUS | Status: AC
  Filled 2021-05-07: qty 50

## 2021-05-07 SURGICAL SUPPLY — 15 items

## 2021-05-07 NOTE — Anesthesia Procedure Notes (Signed)
Date/Time: 05/07/2021 1:54 PM Performed by: Sharlette Dense, CRNA

## 2021-05-07 NOTE — Op Note (Signed)
[facility] Patient Name: Brenda Stewart Procedure Date: 05/07/2021 MRN: [id number] Attending MD: [name] , MD Date of Birth: [birth year] CSN: [id number] Age: 70 Admit Type: Inpatient Procedure:                EGD with biopsies. 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 Providers:                [name], MD, Particia Nearing, RN, Hinton Dyer,                            technician, Danley Danker, CRNA Referring MD:             [name], MD Medicines:                Monitored Anesthesia Care Complications:            No immediate complications. Estimated Blood Loss:     Estimated blood loss was minimal. Procedure:                Pre-Anesthesia Assessment: - Prior to the                            procedure, a history and physical was performed,                            and patient medications and allergies were                            reviewed. The patient's tolerance of previous                            anesthesia was also reviewed. The risks and                            benefits of the procedure and the sedation options                            and risks were discussed with the patient. All                            questions were answered, and informed consent was                            obtained. Prior Anticoagulants: The patient has                            taken no previous anticoagulant or antiplatelet  agents. ASA Grade Assessment: IV - A patient with                            severe systemic disease that is a constant threat                            to life. After reviewing the risks and benefits,                            the patient was deemed in satisfactory condition to                            undergo the procedure. After obtaining  informed                            consent, the endoscope was passed under direct                            vision. Throughout the procedure, the patient's                            blood pressure, pulse, and oxygen saturations were                            monitored continuously. The [id number]-[id number] ([id number])                            Olympus endoscope was introduced through the mouth,                            and advanced to the second part of duodenum. The                            EGD was accomplished without difficulty. The                            patient tolerated the procedure well. Scope In: Scope Out: Findings:      White plaque like lesions were noted in the lower third of the       esophagus-cells for cytology were obtained by brushing.      Debris was noted in the cardia, in the gastric fundus and in the gastric       body.      The rest of the cardia and gastric fundus were normal on retroflexion.      A single 9 mm mucosal nodule was found in the duodenal bulb-biopsies       done x 4.      The first portion of the duodenum and second portion of the duodenum       were normal. Impression:               - White plaque like lesions in the distal 1/3                            esophageal mucosa-cells for cytology  obtained.                           - Debrias noted in the proximal stomach.                           - 9 mm mucosal nodule found in the duodenal                            bulb-biopsied.                           - Normal first portion of the duodenum and second                            portion of the duodenum. Moderate Sedation:      MAC used. Recommendation:           - Clear liquid diet today.                           - Continue present medications; resume Heparin now.                           - Await pathology results. Procedure Code(s):        --- Professional ---                           [phone], Esophagogastroduodenoscopy, flexible,                             transoral; with biopsy, single or multiple Diagnosis Code(s):        --- Professional ---                           K21.9, Gastro-esophageal reflux disease without                            esophagitis                           R10.13, Epigastric pain                           R93.3, Abnormal findings on diagnostic imaging of                            other parts of digestive tract CPT copyright 2019 [employer]. All rights reserved. The codes documented in this report are preliminary and upon coder review may  be revised to meet current compliance requirements. [name], MD [name], MD 05/07/2021 2:41:29 PM This report has been signed electronically. Number of Addenda: 0

## 2021-05-07 NOTE — Progress Notes (Addendum)
[place] for Heparin Indication: pulmonary embolus  No Known Allergies  Patient Measurements: Height: '5\' 8"'$  (172.7 cm) Weight: 85.7 kg (188 lb 15 oz) IBW/kg (Calculated) : 63.9 Heparin Dosing Weight: 81 kg  Vital Signs: Temp: 98.8 F (37.1 C) (08/15 0515) Temp Source: Oral (08/15 0515) BP: 116/63 (08/15 0515) Pulse Rate: 97 (08/15 0515)  Labs: Recent Labs    05/05/21 0409 05/06/21 0549 05/07/21 0549  HGB 9.6* 9.5* 9.0*  HCT 29.5* 29.2* 27.9*  PLT 347 386 354  HEPARINUNFRC 0.39 0.40 0.21*  CREATININE 0.69 0.88 0.76    Estimated Creatinine Clearance: 77.1 mL/min (by C-G formula based on SCr of 0.76 mg/dL).  Medical History: Past Medical History:  Diagnosis Date   Acid reflux    Hypertension    Medications:  Scheduled:   Chlorhexidine Gluconate Cloth  6 each Topical Daily   insulin aspart  0-15 Units Subcutaneous Q8H   pantoprazole (PROTONIX) IV  40 mg Intravenous Q12H   sodium chloride flush  10-40 mL Intracatheter Q12H   sodium chloride flush  10-40 mL Intracatheter Q12H   Infusions:   heparin 1,200 Units/hr (05/07/21 0551)   TPN ADULT (ION) 80 mL/hr at 05/07/21 0153   Assessment: 7 yoF with peritoneal carcinomatosis - now identified as Signet ring Ca, Chemo Folfox to begin 8/12.  NPO since admit for SBO, Diagnostic Lap 8/8, TPN started 8/8.  8/12 CT w/contrast for staging > PE. Pharmacy consulted to dose/monitor heparin drip  Today, 05/07/21 HL = 0.21 is subtherapeutic on heparin infusion of 1200 units/hr CBC: Hgb (9) low but stable; Plt WNL Note: FOLFOX 8/12, anticipate cell counts to drop s/p chemo Renal function stable, WNL. Confirmed with RN that heparin infusing at correct rate. No interruptions. No signs of bleeding.   Patient scheduled for EGD this afternoon.  Goal of Therapy:  Heparin level 0.3-0.7 units/ml Monitor platelets by anticoagulation protocol: Yes   Plan:  Increase heparin infusion to 1350  units/hr Check 8 hour HL CBC, HL daily while on heparin infusion Monitor for signs of bleeding  Will need guidance from GI on if/when heparin infusion needs to be held prior to procedure. Text paged on amion x2, awaiting response.   Lenis Noon PharmD 05/07/2021,8:43 AM  Addendum:  Damaris Schooner with Dr. Collene Mares, heparin drip to be held at 1000 AM this morning (4 hours prior to procedure).   Follow up for guidance from GI on when safe to resume post-procedure.   Lenis Noon, PharmD 05/07/21 9:40 AM

## 2021-05-07 NOTE — Interval H&P Note (Signed)
History and Physical Interval Note:  05/07/2021 1:51 PM  Brenda Stewart  has presented today for surgery, with the diagnosis of Metastatic cancer.  The various methods of treatment have been discussed with the patient and family. After consideration of risks, benefits and other options for treatment, the patient has consented to  Procedure(s): ESOPHAGOGASTRODUODENOSCOPY (EGD) WITH PROPOFOL (N/A) as a surgical intervention.  The patient's history has been reviewed, patient examined, no change in status, stable for surgery.  I have reviewed the patient's chart and labs.  Questions were answered to the patient's satisfaction.     [name]

## 2021-05-07 NOTE — Progress Notes (Addendum)
HEMATOLOGY-ONCOLOGY PROGRESS NOTE  SUBJECTIVE: Tolerated chemotherapy well overall.  She denies abdominal pain, nausea, vomiting this morning.  Reports continued loose bowel movements about 5 times per day.  Remains on a heparin drip and is scheduled for EGD later today.  TPN infusing.  Oncology History  Primary peritoneal carcinomatosis ([place])  04/25/2021 Initial Diagnosis   Primary peritoneal carcinomatosis ([place])   04/27/2021 - 04/27/2021 Chemotherapy          05/04/2021 -  Chemotherapy    Patient is on Treatment Plan: GASTRIC FOLFOX Q14D X 12 CYCLES        REVIEW OF SYSTEMS:   Constitutional: Denies fevers, chills  Eyes: Denies blurriness of vision Ears, nose, mouth, throat, and face: Denies mucositis or sore throat Respiratory: Denies cough, dyspnea or wheezes Cardiovascular: Denies palpitation, chest discomfort Gastrointestinal: Denies abdominal pain, nausea, vomiting.  Reports loose stools about 5 times a day. Skin: Denies abnormal skin rashes Lymphatics: Denies new lymphadenopathy or easy bruising Neurological:Denies numbness, tingling or new weaknesses Behavioral/Psych: Mood is stable, no new changes  Extremities: No lower extremity edema All other systems were reviewed with the patient and are negative.  I have reviewed the past medical history, past surgical history, social history and family history with the patient and they are unchanged from previous note.   PHYSICAL EXAMINATION: ECOG PERFORMANCE STATUS: 2 - Symptomatic, <50% confined to bed  Vitals:   05/06/21 2118 05/07/21 0515  BP: 124/84 116/63  Pulse: 95 97  Resp: 16 17  Temp: 98.9 F (37.2 C) 98.8 F (37.1 C)  SpO2: 98% 98%   Filed Weights   05/05/21 0627 05/06/21 0500 05/07/21 0518  Weight: 85.9 kg 83.8 kg 85.7 kg    Intake/Output from previous day: 08/14 0701 - 08/15 0700 In: 2303.7 [I.V.:2303.7] Out: -   GENERAL:alert, no distress and comfortable SKIN: skin color, texture, turgor are  normal, no rashes or significant lesions EYES: normal, Conjunctiva are pink and non-injected, sclera clear OROPHARYNX:no exudate, no erythema and lips, buccal mucosa, and tongue normal  LYMPH:  no palpable lymphadenopathy in the cervical, axillary or inguinal LUNGS: clear to auscultation and percussion with normal breathing effort HEART: regular rate & rhythm and no murmurs and no lower extremity edema ABDOMEN:abdomen soft, non-tender and normal bowel sounds Musculoskeletal:no cyanosis of digits and no clubbing  NEURO: alert & oriented x 3 with fluent speech, no focal motor/sensory deficits  LABORATORY DATA:  I have reviewed the data as listed CMP Latest Ref Rng & Units 05/07/2021 05/06/2021 05/05/2021  Glucose 70 - 99 mg/dL 131(H) 123(H) 169(H)  BUN 8 - 23 mg/dL 24(H) 22 19  Creatinine 0.44 - 1.00 mg/dL 0.76 0.88 0.69  Sodium 135 - 145 mmol/L 135 134(L) 136  Potassium 3.5 - 5.1 mmol/L 4.0 4.3 4.7  Chloride 98 - 111 mmol/L 104 102 105  CO2 22 - 32 mmol/L '25 24 22  '$ Calcium 8.9 - 10.3 mg/dL 8.5(L) 8.4(L) 9.0  Total Protein 6.5 - 8.1 g/dL 6.2(L) - -  Total Bilirubin 0.3 - 1.2 mg/dL 0.4 - -  Alkaline Phos 38 - 126 U/L 56 - -  AST 15 - 41 U/L 18 - -  ALT 0 - 44 U/L 11 - -    Lab Results  Component Value Date   WBC 8.0 05/07/2021   HGB 9.0 (L) 05/07/2021   HCT 27.9 (L) 05/07/2021   MCV 93.3 05/07/2021   PLT 354 05/07/2021   NEUTROABS 5.8 05/07/2021    CT ABDOMEN PELVIS WO CONTRAST  Result Date: 04/21/2021 CLINICAL DATA:  Nausea and vomiting for 2 days. Lower abdominal pain for approximately 3 months. Chronic constipation. EXAM: CT ABDOMEN AND PELVIS WITHOUT CONTRAST TECHNIQUE: Multidetector CT imaging of the abdomen and pelvis was performed following the standard protocol without IV contrast. COMPARISON:  None. FINDINGS: Lower chest: No acute findings. Hepatobiliary: No mass visualized on this unenhanced exam. Gallbladder is unremarkable. No evidence of biliary ductal dilatation.  Pancreas: No mass or inflammatory process visualized on this unenhanced exam. Spleen:  Within normal limits in size. Adrenals/Urinary tract: No evidence of urolithiasis or hydronephrosis. Unremarkable unopacified urinary bladder. Stomach/Bowel: Small hiatal hernia is seen containing ascites. Moderate diffuse small bowel dilatation is seen, with suspected transition point in the right lower quadrant, suspicious for distal small bowel obstruction. Vascular/Lymphatic: No pathologically enlarged lymph nodes identified. No evidence of abdominal aortic aneurysm. Aortic atherosclerotic calcification noted. Reproductive: Prior hysterectomy suspected. Moderate ascites is seen. Omental caking and diffuse mesenteric soft tissue stranding is seen. There is also probable peritoneal nodularity along the left pelvic sidewall. These findings are highly suspicious for peritoneal carcinomatosis. Other:  None. Musculoskeletal:  No suspicious bone lesions identified. 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 Suspected distal small bowel obstruction, with probable transition point in right lower quadrant. Aortic Atherosclerosis (ICD10-I70.0). Electronically Signed   By: Marlaine Hind M.D.   On: 04/21/2021 18:36   CT CHEST W CONTRAST  Result Date: 05/04/2021 CLINICAL DATA:  GI stromal cancer staging. Peritoneal carcinomatosis. Right Port-A-Cath tip: SVC. EXAM: CT CHEST WITH CONTRAST TECHNIQUE: Multidetector CT imaging of the chest was performed during intravenous contrast administration. CONTRAST:  42m OMNIPAQUE IOHEXOL 350 MG/ML SOLN COMPARISON:  CT abdomen 04/29/2021 FINDINGS: Cardiovascular: Today's exam is not optimized to assess for pulmonary embolus, but there is suspicion for mild segmental filling defect in the anterior segment right upper lobe and apical segment right upper lobe concerning for pulmonary embolus. Clot burden is small. Right ventricular to left ventricular ratio 1.1.  Mediastinum/Nodes: Multiple hypodense thyroid nodules measure up to 2.1 cm in diameter. Recommend thyroid [place](ref: J Am Coll Radiol. 2015 Feb;12(2): 143-50).Distal paraesophageal lymph node 0.8 cm in short axis on image 113 series 2. No overtly pathologic thoracic adenopathy is identified. Lungs/Pleura: Small left pleural effusion (nonspecific for transudative or exudative etiology) with passive atelectasis in the left lower lobe. Subsegmental atelectasis or scarring along the right hemidiaphragm. No worrisome pulmonary nodules. Upper Abdomen: Left upper quadrant ascites along the lesser sac and below the diaphragm with some infiltration of the adjacent omentum. Small focal hypodensities posteriorly along the right hepatic lobe as on recent CT abdomen. Periportal edema. These findings are similar to recent CT abdomen although the amount of left upper quadrant ascites is reduced. There is gas density in the subcutaneous tissues of the left upper abdomen and lower chest. Musculoskeletal: Degenerative glenohumeral arthropathy, right greater than left. Lower cervical and thoracic spondylosis. IMPRESSION: 1. Suspected segmental filling defects in the anterior segment right upper lobe and apical segment right upper lobe compatible with small pulmonary emboli. Clot burden appears small, although please note that today's examination was not a dedicated CT angiogram and as such might overestimate or underestimate pulmonary embolus. Positive for acute PE with CT evidence of right heart strain (RV/LV Ratio = 1.1) consistent with at least submassive (intermediate risk) PE. The presence of right heart strain has been associated with an increased risk of morbidity and mortality. Please refer to the "PE Focused" order set in EPIC. 2. No compelling findings  of metastatic disease to the chest. There is a small left pleural effusion, but no definite enhancement along the pleura to suggest malignant or exudative etiology at this time.  3. There is some new gas in the soft tissues of the left thoracoabdominal wall, compatible subcutaneous emphysema, probably related to recent laparoscopy or other procedure. 4. Hypodense thyroid nodules up to 2.1 cm in diameter. Recommend thyroid US (ref: J Am Coll Radiol. 2015 Feb;12(2): 143-50). 5. Left upper quadrant ascites, including fluid in the lesser sac. This is reduced compared to the prior CT abdomen of 04/29/2021. 6. Stable appearance of periportal edema and hypodensities along the posterior margin of the right hepatic lobe. Critical Value/emergent results were called by telephone at the time of interpretation on 05/04/2021 at 12:53 pm to provider [facility] , who verbally acknowledged these results. Electronically Signed   By: Van Clines M.D.   On: 05/04/2021 12:53   CT ABDOMEN PELVIS W CONTRAST  Result Date: 04/29/2021 CLINICAL DATA:  70 year old female for staging of peritoneal carcinomatosis prior to chemotherapy. EXAM: CT ABDOMEN AND PELVIS WITH CONTRAST TECHNIQUE: Multidetector CT imaging of the abdomen and pelvis was performed using the standard protocol following bolus administration of intravenous contrast. CONTRAST:  43m OMNIPAQUE IOHEXOL 350 MG/ML SOLN COMPARISON:  04/21/2021 noncontrast study. FINDINGS: Lower chest: A small LEFT pleural effusion with LEFT LOWER lung atelectasis is now noted. A trace RIGHT pleural effusion with mild RIGHT basilar atelectasis is identified. Hepatobiliary: Small hypodense lesions along the INFERIOR RIGHT liver are compatible with subserosal implants. No other hepatic abnormalities are present. The gallbladder appears mildly thick walled. No biliary dilatation. Pancreas: Unremarkable Spleen: Unremarkable Adrenals/Urinary Tract: The kidneys, adrenal glands and bladder are unremarkable. Stomach/Bowel: No dilated bowel loops are noted. No definite focal bowel wall thickening is identified. Vascular/Lymphatic: Aortic atherosclerosis. No enlarged  abdominal or pelvic lymph nodes. Reproductive: Patient is status post hysterectomy. No definite adnexal masses are noted. Other: Moderate ascites within the anterior/mid abdomen extending into the pelvis are noted. There is mild peritoneal thickening/minimal peritoneal irregularity noted in several areas. There are smaller focal fluid/cystic areas within the RIGHT abdomen and LOWER pelvis. Diffuse omental thickening/caking is noted. Mild stranding within the mesentery is also present. No evidence of pneumoperitoneum. Musculoskeletal: No acute or suspicious bony abnormalities are identified. IMPRESSION: 1. Moderate ascites, mild peritoneal thickening and diffuse omental thickening/caking and smaller focal fluid/cystic areas within the RIGHT abdomen and LOWER pelvis, compatible with peritoneal carcinomatosis. Mild mesenteric stranding is nonspecific. 2. Small subserosal implants along the INFERIOR RIGHT liver. 3. Small LEFT pleural effusion and trace RIGHT pleural effusion with LEFT LOWER lung atelectasis. 4. Aortic Atherosclerosis (ICD10-I70.0). Electronically Signed   By: JMargarette CanadaM.D.   On: 04/29/2021 12:16   [place] Limited  Result Date: 04/22/2021 CLINICAL DATA:  Peritoneal neoplasm abdominal distension, recurrent ascites EXAM: LIMITED ABDOMEN ULTRASOUND FOR ASCITES TECHNIQUE: Limited ultrasound survey for ascites was performed in all four abdominal quadrants. COMPARISON:  04/21/2021 CT FINDINGS: Survey of the abdominal 4 quadrants demonstrates a small volume of lower abdominal ascites by ultrasound. Unable to perform paracentesis because of intractable nausea vomiting today. IMPRESSION: Small volume of lower abdominopelvic ascites. Will reassess for paracentesis tomorrow 04/23/2021. Electronically Signed   By: MJerilynn Mages  Shick M.D.   On: 04/22/2021 12:09   DG Abd 2 Views  Result Date: 04/28/2021 CLINICAL DATA:  Small bowel obstruction. EXAM: ABDOMEN - 2 VIEW COMPARISON:  04/26/2021 and earlier FINDINGS:  There is focal atelectasis or. No free intraperitoneal consolidation  MEDIAL LEFT LOWER lobe air. Persistent mild dilatation of small bowel loops in the LEFT central abdomen, similar in appearance to previous CT exam. Air is identified within nondilated loops of colon. There are degenerative changes in both hips. IMPRESSION: 1. Persistent mild dilatation of small bowel loops in the LEFT central abdomen. 2. LEFT LOWER lobe atelectasis or consolidation. Electronically Signed   By: Nolon Nations M.D.   On: 04/28/2021 10:13   DG Abd 2 Views  Result Date: 04/26/2021 CLINICAL DATA:  Follow-up small bowel obstruction. EXAM: ABDOMEN - 2 VIEW COMPARISON:  A2 22 FINDINGS: There is mild residual gaseous distension of multiple small bowel loops, decreased from prior. A small amount of gas is scattered in the colon and rectum. No intraperitoneal free air is identified. Atelectasis is noted in the left lung base. IMPRESSION: Decreased small bowel distension compatible with improving obstruction. Electronically Signed   By: Logan Bores M.D.   On: 04/26/2021 12:45   DG Abd Portable 1V  Result Date: 04/24/2021 CLINICAL DATA:  Small-bowel obstruction. EXAM: PORTABLE ABDOMEN - 1 VIEW COMPARISON:  04/22/2021.  CT 04/21/2021. FINDINGS: Mild-to-moderately distended loops of small bowel with a relative paucity of colonic gas again noted. Findings again consistent with small bowel obstruction. No free air. Ascites most likely present. Pelvic calcifications consistent phleboliths. Degenerative changes lumbar spine and both hips. IMPRESSION: Mild-to-moderate small-bowel distention with a paucity of intra colonic air again noted. Findings again consistent with small bowel obstruction. Ascites most likely present. Electronically Signed   By: Marcello Moores  Register   On: 04/24/2021 11:08   DG Abd Portable 1V-Small Bowel Obstruction Protocol-initial, 8 hr delay  Result Date: 04/22/2021 CLINICAL DATA:  Small bowel obstruction, 8 hour film.  EXAM: PORTABLE ABDOMEN - 1 VIEW COMPARISON:  CT yesterday. FINDINGS: Administered enteric contrast has reached the ascending and proximal transverse colon. There is enteric contrast within prominent small bowel in the central abdomen. Left upper quadrant contrast may be residual within small bowel or at the splenic flexure. No evidence of free air. IMPRESSION: Administered enteric contrast has reached the ascending and proximal transverse colon. There is enteric contrast within prominent small bowel in the central abdomen. Left upper quadrant contrast may be residual within small bowel or at the splenic flexure. Electronically Signed   By: Keith Rake M.D.   On: 04/22/2021 17:38   ECHOCARDIOGRAM COMPLETE  Result Date: 05/05/2021    ECHOCARDIOGRAM REPORT   Patient Name:   LORIANNE TERHUNE [facility] Date of Exam: 05/05/2021 Medical Rec #:  [id number]      Height:       68.0 in Accession #:    [id number]     Weight:       189.4 lb Date of Birth:  1951/06/30     BSA:          1.997 m Patient Age:    3 years       BP:           129/80 mmHg Patient Gender: F              HR:           90 bpm. Exam Location:  Inpatient Procedure: 2D Echo, Cardiac Doppler and Color Doppler Indications:    Pulmonary Embolus I26.09  History:        Patient has no prior history of Echocardiogram examinations.                 Risk Factors:Hypertension.  Sonographer:  Darlina Sicilian RDCS Referring Phys: [place]  1. Left ventricular ejection fraction, by estimation, is 60 to 65%. The left ventricle has normal function. The left ventricle has no regional wall motion abnormalities. There is mild left ventricular hypertrophy of the basal-septal segment. Left ventricular diastolic parameters are consistent with Grade I diastolic dysfunction (impaired relaxation).  2. Right ventricular systolic function is normal. The right ventricular size is normal.  3. Moderate pleural effusion in the left lateral region.  4. The mitral valve  is normal in structure. Trivial mitral valve regurgitation. No evidence of mitral stenosis.  5. The aortic valve is tricuspid. Aortic valve regurgitation is not visualized. Mild aortic valve sclerosis is present, with no evidence of aortic valve stenosis.  6. The inferior vena cava is normal in size with greater than 50% respiratory variability, suggesting right atrial pressure of 3 mmHg. FINDINGS  Left Ventricle: Left ventricular ejection fraction, by estimation, is 60 to 65%. The left ventricle has normal function. The left ventricle has no regional wall motion abnormalities. The left ventricular internal cavity size was normal in size. There is  mild left ventricular hypertrophy of the basal-septal segment. Left ventricular diastolic parameters are consistent with Grade I diastolic dysfunction (impaired relaxation). Right Ventricle: The right ventricular size is normal. Right ventricular systolic function is normal. Left Atrium: Left atrial size was normal in size. Right Atrium: Right atrial size was normal in size. Pericardium: There is no evidence of pericardial effusion. Mitral Valve: The mitral valve is normal in structure. Trivial mitral valve regurgitation. No evidence of mitral valve stenosis. Tricuspid Valve: The tricuspid valve is normal in structure. Tricuspid valve regurgitation is trivial. No evidence of tricuspid stenosis. Aortic Valve: The aortic valve is tricuspid. Aortic valve regurgitation is not visualized. Mild aortic valve sclerosis is present, with no evidence of aortic valve stenosis. Pulmonic Valve: The pulmonic valve was normal in structure. Pulmonic valve regurgitation is trivial. No evidence of pulmonic stenosis. Aorta: The aortic root is normal in size and structure. Venous: The inferior vena cava is normal in size with greater than 50% respiratory variability, suggesting right atrial pressure of 3 mmHg. IAS/Shunts: No atrial level shunt detected by color flow Doppler. Additional  Comments: There is a moderate pleural effusion in the left lateral region.  LEFT VENTRICLE PLAX 2D LVIDd:         3.90 cm  Diastology LVIDs:         2.90 cm  LV e' medial:    6.18 cm/s LV PW:         0.80 cm  LV E/e' medial:  8.6 LV IVS:        1.40 cm  LV e' lateral:   9.03 cm/s LVOT diam:     2.10 cm  LV E/e' lateral: 5.9 LV SV:         61 LV SV Index:   31 LVOT Area:     3.46 cm  RIGHT VENTRICLE RV S prime:     14.40 cm/s TAPSE (M-mode): 1.1 cm LEFT ATRIUM             Index       RIGHT ATRIUM           Index LA diam:        2.70 cm 1.35 cm/m  RA Area:     13.60 cm LA Vol (A2C):   25.1 ml 12.57 ml/m RA Volume:   28.20 ml  14.12 ml/m LA Vol (A4C):  16.0 ml 8.01 ml/m LA Biplane Vol: 20.0 ml 10.02 ml/m  AORTIC VALVE LVOT Vmax:   113.50 cm/s LVOT Vmean:  74.400 cm/s LVOT VTI:    0.177 m  AORTA Ao Root diam: 3.00 cm Ao Asc diam:  3.25 cm MITRAL VALVE MV Area (PHT): 3.78 cm    SHUNTS MV Decel Time: 201 msec    Systemic VTI:  0.18 m MV E velocity: 53.35 cm/s  Systemic Diam: 2.10 cm MV A velocity: 88.05 cm/s MV E/A ratio:  0.61 Kirk Ruths MD Electronically signed by Kirk Ruths MD Signature Date/Time: 05/05/2021/10:24:12 AM    Final    IR IMAGING GUIDED PORT INSERTION  Result Date: 04/26/2021 CLINICAL DATA:  Peritoneal carcinomatosis, unknown primary, access for chemotherapy EXAM: RIGHT INTERNAL JUGULAR SINGLE LUMEN POWER PORT CATHETER INSERTION Date:  04/26/2021 04/26/2021 2:15 pm Radiologist:  Jerilynn Mages. Daryll Brod, MD Guidance:  Ultrasound and fluoroscopic MEDICATIONS: 1% lidocaine with epinephrine local ANESTHESIA/SEDATION: Versed 2.0 mg IV; Fentanyl 100 mcg IV; Moderate Sedation Time:  25 minutes The patient was continuously monitored during the procedure by the interventional radiology nurse under my direct supervision. FLUOROSCOPY TIME:  One minutes, 0 seconds (5 mGy) COMPLICATIONS: None immediate. CONTRAST:  None. PROCEDURE: Informed consent was obtained from the patient following explanation of the  procedure, risks, benefits and alternatives. The patient understands, agrees and consents for the procedure. All questions were addressed. A time out was performed. Maximal barrier sterile technique utilized including caps, mask, sterile gowns, sterile gloves, large sterile drape, hand hygiene, and 2% chlorhexidine scrub. Under sterile conditions and local anesthesia, right internal jugular micropuncture venous access was performed. Access was performed with ultrasound. Images were obtained for documentation of the patent right internal jugular vein. A guide wire was inserted followed by a transitional dilator. This allowed insertion of a guide wire and catheter into the IVC. Measurements were obtained from the SVC / RA junction back to the right IJ venotomy site. In the right infraclavicular chest, a subcutaneous pocket was created over the second anterior rib. This was done under sterile conditions and local anesthesia. 1% lidocaine with epinephrine was utilized for this. A 2.5 cm incision was made in the skin. Blunt dissection was performed to create a subcutaneous pocket over the right pectoralis major muscle. The pocket was flushed with saline vigorously. There was adequate hemostasis. The port catheter was assembled and checked for leakage. The port catheter was secured in the pocket with two retention sutures. The tubing was tunneled subcutaneously to the right venotomy site and inserted into the SVC/RA junction through a valved peel-away sheath. Position was confirmed with fluoroscopy. Images were obtained for documentation. The patient tolerated the procedure well. No immediate complications. Incisions were closed in a two layer fashion with 4 - 0 Vicryl suture. Dermabond was applied to the skin. The port catheter was accessed, blood was aspirated followed by saline and heparin flushes. Needle was removed. A dry sterile dressing was applied. IMPRESSION: Ultrasound and fluoroscopically guided right internal  jugular single lumen power port catheter insertion. Tip in the SVC/RA junction. Catheter ready for use. Electronically Signed   By: Jerilynn Mages.  Shick M.D.   On: 04/26/2021 14:22   VAS [place] LOWER EXTREMITY VENOUS (DVT)  Result Date: 05/05/2021  Lower Venous DVT Study Patient Name:  CHARISE KOEGEL [facility]  Date of Exam:   05/05/2021 Medical Rec #: [id number]       Accession #:    [id number] Date of Birth: [birth year]      Patient Gender:  F Patient Age:   89 years Exam Location:  [facility] Procedure:      VAS [place] LOWER EXTREMITY VENOUS (DVT) Referring Phys: Eulogio Bear --------------------------------------------------------------------------------  Indications: Pulmonary embolism.  Comparison Study: no prior Performing Technologist: Archie Patten RVS  Examination Guidelines: A complete evaluation includes B-mode imaging, spectral Doppler, color Doppler, and power Doppler as needed of all accessible portions of each vessel. Bilateral testing is considered an integral part of a complete examination. Limited examinations for reoccurring indications may be performed as noted. The reflux portion of the exam is performed with the patient in reverse Trendelenburg.  +---------+---------------+---------+-----------+----------+--------------+ RIGHT    CompressibilityPhasicitySpontaneityPropertiesThrombus Aging +---------+---------------+---------+-----------+----------+--------------+ CFV      Full           Yes      Yes                                 +---------+---------------+---------+-----------+----------+--------------+ SFJ      Full                                                        +---------+---------------+---------+-----------+----------+--------------+ FV Prox  Full                                                        +---------+---------------+---------+-----------+----------+--------------+ FV Mid   Full                                                         +---------+---------------+---------+-----------+----------+--------------+ FV DistalFull                                                        +---------+---------------+---------+-----------+----------+--------------+ PFV      Full                                                        +---------+---------------+---------+-----------+----------+--------------+ POP      Full           Yes      Yes                                 +---------+---------------+---------+-----------+----------+--------------+ PTV      Full                                                        +---------+---------------+---------+-----------+----------+--------------+ PERO  Full                                                        +---------+---------------+---------+-----------+----------+--------------+   +---------+---------------+---------+-----------+----------+--------------+ LEFT     CompressibilityPhasicitySpontaneityPropertiesThrombus Aging +---------+---------------+---------+-----------+----------+--------------+ CFV      Full           Yes      Yes                                 +---------+---------------+---------+-----------+----------+--------------+ SFJ      Full                                                        +---------+---------------+---------+-----------+----------+--------------+ FV Prox  Full                                                        +---------+---------------+---------+-----------+----------+--------------+ FV Mid   Full                                                        +---------+---------------+---------+-----------+----------+--------------+ FV DistalFull                                                        +---------+---------------+---------+-----------+----------+--------------+ PFV      Full                                                         +---------+---------------+---------+-----------+----------+--------------+ POP      Full           Yes      Yes                                 +---------+---------------+---------+-----------+----------+--------------+ PTV      Full                                                        +---------+---------------+---------+-----------+----------+--------------+ PERO     Full                                                        +---------+---------------+---------+-----------+----------+--------------+  Summary: BILATERAL: - No evidence of deep vein thrombosis seen in the lower extremities, bilaterally. -No evidence of popliteal cyst, bilaterally.   *See table(s) above for measurements and observations. Electronically signed by Harold Barban MD on 05/05/2021 at 1:26:43 PM.    Final    [place] EKG SITE RITE  Result Date: 05/04/2021 If Site Rite image not attached, placement could not be confirmed due to current cardiac rhythm.  IR Paracentesis  Result Date: 04/24/2021 INDICATION: Abdominal distention secondary to ascites. Underlying concern for peritoneal carcinomatosis on imaging findings. Request for diagnostic and therapeutic paracentesis. EXAM: ULTRASOUND GUIDED LEFT LOWER QUADRANT PARACENTESIS MEDICATIONS: 1% plain lidocaine, 5 mL COMPLICATIONS: None immediate. PROCEDURE: Informed written consent was obtained from the patient after a discussion of the risks, benefits and alternatives to treatment. A timeout was performed prior to the initiation of the procedure. Initial ultrasound scanning demonstrates a large amount of ascites within the left lower abdominal quadrant. The left lower abdomen was prepped and draped in the usual sterile fashion. 1% lidocaine was used for local anesthesia. Following this, a 19 gauge, 7-cm, Yueh catheter was introduced. An ultrasound image was saved for documentation purposes. The paracentesis was performed. The catheter was removed and a dressing was  applied. The patient tolerated the procedure well without immediate post procedural complication. FINDINGS: A total of approximately 2.1 L of clear yellow fluid was removed. Samples were sent to the laboratory as requested by the clinical team. IMPRESSION: Successful ultrasound-guided paracentesis yielding 2.1 liters of peritoneal fluid. Read by: Ascencion Dike PA-C Electronically Signed   By: Michaelle Birks MD   On: 04/24/2021 12:38    ASSESSMENT AND PLAN: 1.  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  Malignant ascites 3.  Protein calorie malnutrition 4.  Mild anemia 5.  Hypertension 6.  Pulmonary embolism  -Tolerated her first cycle of chemotherapy well overall.  Continue supportive care.  We will plan for a second cycle of chemotherapy on 05/21/2021 as an outpatient.  We will arrange for outpatient follow-up in 1 week to recheck labs and toxicity check. -She will proceed with EGD later today. -The patient will need to go home with TPN.  I have placed a TOC consultation to arrange for home health.  Her PICC line will need to remain in place because she will need to have TPN and chemotherapy.  We will consider discontinuation of her PICC line in about a month if she can start taking in oral nutrition. -We can consider starting her on a liquid diet once the EGD is complete and we see the results. -For anticoagulation, continue heparin drip for now.  Once EGD is complete, we will consider switching her over to a DOAC.  Either Eliquis or Xarelto is fine from our standpoint and we will plan to prescribe whichever drug her insurance prefers.  We will finalize the dosing once her EGD is complete.   LOS: 16 days   [name], DNP, AGPCNP-BC, AOCNP 05/07/21  Addendum  I have seen the patient, examined her. I agree with the assessment and and plan and have edited the notes.   Pt is clinically stable.  She tolerated EGD well this afternoon.  EGD report  reviewed, no obvious malignancy in the esophagus, stomach or duodenum, a few small lesions were biopsied, pathology pending.  We will start her on clear liquid, to see how she tolerates, she knows to stop if she has recurrent nausea and or vomiting.  Plan to  discharge home with TPN, likely in the next few days.  Okay to switch to Eliquis or Xarelto on discharge for PE.  I will follow-up her in my clinic closely. Plan to give second cycle chemo in clinic in 2 weeks.   [name]  05/07/2021

## 2021-05-07 NOTE — Progress Notes (Signed)
PHARMACY - TOTAL PARENTERAL NUTRITION CONSULT NOTE   Indication: Gyn malignancy, subacute bowel obstruction, NPO  Patient Measurements: Height: '5\' 8"'$  (172.7 cm) Weight: 85.7 kg (188 lb 15 oz) IBW/kg (Calculated) : 63.9 TPN AdjBW (KG): 77 Body mass index is 28.73 kg/m. TBW 77 kg  Assessment: 62 yoF with peritoneal carcinomatosis secondary to signet ring cell carcinoma with malignant ascites and bowel obstruction. Pharmacy consulted to manage TPN on 8/8.  Glucose / Insulin: No hx DM. A1c 6.4%. Goal CBG <180.  - Range: 125-133; 6 units SSI/24 hours - Dexamethasone '8mg'$  x1 on 8/8, 10 mg IV x1 on 8/12 Electrolytes: All lytes WNL including CorrCa (9.8) Renal: SCr and BUN - WNL, stable Hepatic: LFTs WNL, TG 44 (8/15), PreAlb 10.8 low but increasing (8/15) Intake / Output; MIVF: Strict I/O not measured - UOP: unmeasured x6, stool output x3 -MIVF stopped on 8/13 GI Imaging: -8/7 Abd/Pelvic CT with contrast: peritoneal carcinomatosis, moderate ascites, diffuse omental caking/cystic areas - none amenable to percutaneous biopsy. GI Surgeries / Procedures: Diagnostic Lap with biopsy 8/8 Onc note: likely GI primary - signet ring Ca  Central access: Has PAC placed 8/4, PICC 8/12 TPN start date: 8/8  Nutritional Goals (per RD recommendation on 8/8) kCal: 2100-2300, Protein: 100-115gm , Fluid: > 2L per day   Goal TPN rate 80 mL/hr (provides 106 g of protein and 2108 kcals per day)  Current Nutrition:  NPO except sips with meds, TPN  Plan:   At 1800:  Continue TPN at goal rate of 80 mL/hr Electrolytes in TPN: No changes Na 100 mEq/L K 20 mEq/L Ca 0 mEq/L Mg 4mq/L Phos 8 mmol/L Cl:Ac 1:1 Add standard MVI and trace elements to TPN Add folic acid to TPN (ok with Oncology) Discontinue CBG checks and SSI as stable No MIVF; management per MD. Monitor TPN labs on Mon/Thurs  Patient to discharge on TPN, home health aware.  MLenis NoonPharmD 05/07/2021,7:30 AM

## 2021-05-07 NOTE — Progress Notes (Signed)
PROGRESS NOTE    Brenda Stewart  [id number] DOB: Apr 04, 1951 DOA: 04/21/2021 PCP: Pcp, No      Brief Narrative:  BINTOU HORSTMEYER is a 70 y/o F with HTN, GERD who presented with abdominal pain and was found to have SBO due to peritoneal carcinomatosis.  Patient transferred to [facility] hospital to start chemotherapy.  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  Suspected GI source so EGD pending for Monday AM.  Nutrition through TPN.  Incidentally found PE on CT scan.  Heparin started.     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 GI source -Status post paracentesis- cytology unrevealing -Patient status post port placement 04/26/2021. -FOLFOX s/p 1 cycle -GI consulted for EGD on 8/15  -Per oncology.  Malignant ascites -Status post paracentesis. -Concern for reaccumulation of ascites. -Per oncology  Left pleural effusion -monitor, may need thoracentesis if worsens  Incidental PE -duplex LE negative -no RHS on echo -heparin gtt- change to NOAC if able to take PO or perhaps lovenox if not  Acute renal failure -Secondary to prerenal azotemia.   -Resolved with hydration.    Prolonged QT interval -Repeat EKG with resolution of QT prolongation. -Keep potassium close to 4, magnesium close  to 2. -Follow.  Severe protein calorie malnutrition/unintended weight loss -Likely secondary to malignancy -Patient currently n.p.o. secondary to problem #1- defer to oncology PO intake -TPN started  Thrombocytosis -Resolved.  Hypomagnesemia -Repleted  Hyponatremia -trend  Iron deficiency anemia/folate deficiency -Anemia panel consistent with iron deficiency anemia and folate deficiency. -Folate level high at 5.0, iron at 18, ferritin on 415.  Vitamin B12 at 952. -Hemoglobin stable  -Transfusion threshold hemoglobin < 7.      DVT prophylaxis: Heparin gtt Code Status: Full Family Communication: Updated patient.  No family at bedside. Disposition:   Status is: Inpatient  Remains inpatient appropriate because:Inpatient level of care appropriate due to severity of illness  Dispo: The patient is from: Home              Anticipated d/c is to: Home              Patient currently is not medically stable to d/c.-  initiation of chemo-- ned EGD   Difficult to place patient No       Consultants:  GS:  Oncology: GYN oncology:  GI  Procedures:  CT abdomen pelvis 04/21/2021 Abdominal films 04/22/2021, 04/24/2021, 04/26/2021 Abdominal ultrasound 04/22/2021 Ultrasound-guided left lower quadrant paracentesis--2.1 L of peritoneal fluid removed, per IR, Dr.Mugweru  Right IJ PowerPort placed per IR, Dr. Annamaria Boots 04/26/2021 CT abdomen and pelvis 04/29/2021      Subjective: C/o acid reflux last night and this AM, thinks if she could vomit she would feel better   Objective: Vitals:   05/06/21 1415 05/06/21 2118 05/07/21  0515 05/07/21 0518  BP: 118/72 124/84 116/63   Pulse: 93 95 97   Resp:  16 17   Temp: 98.8 F (37.1 C) 98.9 F (37.2 C) 98.8 F (37.1 C)   TempSrc: Oral Oral Oral   SpO2: 97% 98% 98%   Weight:    85.7 kg  Height:        Intake/Output Summary (Last 24 hours) at 05/07/2021 0842 Last data filed at 05/07/2021 [id number] Gross per 24 hour  Intake 2303.71 ml  Output  --  Net 2303.71 ml   Filed Weights   05/05/21 0627 05/06/21 0500 05/07/21 0518  Weight: 85.9 kg 83.8 kg 85.7 kg    Examination:  General: Appearance:     Overweight female in no acute distress     Lungs:     respirations unlabored  Heart:    Normal heart rate.    MS:   All extremities are intact.    Neurologic:   Awake, alert, oriented x 3. No apparent focal neurological           defect.          Data Reviewed: I have personally reviewed following labs and imaging studies  CBC: Recent Labs  Lab 05/01/21 0520 05/01/21 2050 05/05/21 0409 05/06/21 0549 05/07/21 0549  WBC 13.6* 13.0* 15.6* 11.7* 8.0  NEUTROABS 11.4*  --   --   --  5.8  HGB 10.1* 10.8* 9.6* 9.5* 9.0*  HCT 30.2* 33.6* 29.5* 29.2* 27.9*  MCV 91.8 93.9 93.1 93.0 93.3  PLT 275 300 347 386 [id number]    Basic Metabolic Panel: Recent Labs  Lab 05/01/21 0520 05/01/21 2050 05/02/21 0536 05/03/21 0524 05/04/21 0543 05/05/21 0409 05/06/21 0549 05/07/21 0549  NA 137   < > 138 136 134* 136 134* 135  K 4.7   < > 4.0 4.2 4.6 4.7 4.3 4.0  CL 102   < > 104 102 101 105 102 104  CO2 21*   < > '26 24 23 22 24 25  '$ GLUCOSE 160*   < > 115* 110* 118* 169* 123* 131*  BUN 8   < > '8 12 17 19 22 '$ 24*  CREATININE 0.86   < > 0.85 0.88 0.75 0.69 0.88 0.76  CALCIUM 8.5*   < > 8.4* 8.4* 8.9 9.0 8.4* 8.5*  MG 1.8  --  1.7 1.8 2.1 1.8 1.8 1.8  PHOS 4.1  --  3.3 3.8  --   --  4.5 3.5   < > = values in this interval not displayed.    GFR: Estimated Creatinine Clearance: 77.1 mL/min (by C-G formula based on SCr of 0.76 mg/dL).  Liver Function Tests: Recent Labs  Lab 05/01/21 0520 05/03/21 0524 05/07/21 0549  AST 13* 13* 18  ALT '9 7 11  '$ ALKPHOS 48 50 56  BILITOT 0.3 0.6 0.4  PROT 6.2* 6.1* 6.2*  ALBUMIN 2.6* 2.5* 2.4*    CBG: Recent Labs  Lab 05/05/21 2354 05/06/21 0744 05/06/21 1655 05/07/21 0003 05/07/21 0729  GLUCAP 123* 128* 125* 126* 133*     No results found for this or any previous visit (from the  past 240 hour(s)).         Radiology Studies: ECHOCARDIOGRAM COMPLETE  Result Date: 05/05/2021    ECHOCARDIOGRAM REPORT   Patient Name:   Brenda Stewart [facility] Date of Exam: 05/05/2021 Medical Rec #:  [id number]      Height:       68.0 in Accession #:  [id number]     Weight:       189.4 lb Date of Birth:  [birth year]     BSA:          1.997 m Patient Age:    26 years       BP:           129/80 mmHg Patient Gender: F              HR:           90 bpm. Exam Location:  Inpatient Procedure: 2D Echo, Cardiac Doppler and Color Doppler Indications:    Pulmonary Embolus I26.09  History:        Patient has no prior history of Echocardiogram examinations.                 Risk Factors:Hypertension.  Sonographer:    Darlina Sicilian RDCS Referring Phys: [place]  1. Left ventricular ejection fraction, by estimation, is 60 to 65%. The left ventricle has normal function. The left ventricle has no regional wall motion abnormalities. There is mild left ventricular hypertrophy of the basal-septal segment. Left ventricular diastolic parameters are consistent with Grade I diastolic dysfunction (impaired relaxation).  2. Right ventricular systolic function is normal. The right ventricular size is normal.  3. Moderate pleural effusion in the left lateral region.  4. The mitral valve is normal in structure. Trivial mitral valve regurgitation. No evidence of mitral stenosis.  5. The aortic valve is tricuspid. Aortic valve regurgitation is not visualized. Mild aortic valve sclerosis is present, with no evidence of aortic valve stenosis.  6. The inferior vena cava is normal in size with greater than 50% respiratory variability, suggesting right atrial pressure of 3 mmHg. FINDINGS  Left Ventricle: Left ventricular ejection fraction, by estimation, is 60 to 65%. The left ventricle has normal function. The left ventricle has no regional wall motion abnormalities. The left ventricular internal cavity size was normal in  size. There is  mild left ventricular hypertrophy of the basal-septal segment. Left ventricular diastolic parameters are consistent with Grade I diastolic dysfunction (impaired relaxation). Right Ventricle: The right ventricular size is normal. Right ventricular systolic function is normal. Left Atrium: Left atrial size was normal in size. Right Atrium: Right atrial size was normal in size. Pericardium: There is no evidence of pericardial effusion. Mitral Valve: The mitral valve is normal in structure. Trivial mitral valve regurgitation. No evidence of mitral valve stenosis. Tricuspid Valve: The tricuspid valve is normal in structure. Tricuspid valve regurgitation is trivial. No evidence of tricuspid stenosis. Aortic Valve: The aortic valve is tricuspid. Aortic valve regurgitation is not visualized. Mild aortic valve sclerosis is present, with no evidence of aortic valve stenosis. Pulmonic Valve: The pulmonic valve was normal in structure. Pulmonic valve regurgitation is trivial. No evidence of pulmonic stenosis. Aorta: The aortic root is normal in size and structure. Venous: The inferior vena cava is normal in size with greater than 50% respiratory variability, suggesting right atrial pressure of 3 mmHg. IAS/Shunts: No atrial level shunt detected by color flow Doppler. Additional Comments: There is a moderate pleural effusion in the left lateral region.  LEFT VENTRICLE PLAX 2D LVIDd:         3.90 cm  Diastology LVIDs:         2.90 cm  LV e' medial:    6.18 cm/s LV PW:         0.80 cm  LV E/e' medial:  8.6 LV IVS:  1.40 cm  LV e' lateral:   9.03 cm/s LVOT diam:     2.10 cm  LV E/e' lateral: 5.9 LV SV:         61 LV SV Index:   31 LVOT Area:     3.46 cm  RIGHT VENTRICLE RV S prime:     14.40 cm/s TAPSE (M-mode): 1.1 cm LEFT ATRIUM             Index       RIGHT ATRIUM           Index LA diam:        2.70 cm 1.35 cm/m  RA Area:     13.60 cm LA Vol (A2C):   25.1 ml 12.57 ml/m RA Volume:   28.20 ml  14.12 ml/m  LA Vol (A4C):   16.0 ml 8.01 ml/m LA Biplane Vol: 20.0 ml 10.02 ml/m  AORTIC VALVE LVOT Vmax:   113.50 cm/s LVOT Vmean:  74.400 cm/s LVOT VTI:    0.177 m  AORTA Ao Root diam: 3.00 cm Ao Asc diam:  3.25 cm MITRAL VALVE MV Area (PHT): 3.78 cm    SHUNTS MV Decel Time: 201 msec    Systemic VTI:  0.18 m MV E velocity: 53.35 cm/s  Systemic Diam: 2.10 cm MV A velocity: 88.05 cm/s MV E/A ratio:  0.61 Kirk Ruths MD Electronically signed by Kirk Ruths MD Signature Date/Time: 05/05/2021/10:24:12 AM    Final    VAS [place] LOWER EXTREMITY VENOUS (DVT)  Result Date: 05/05/2021  Lower Venous DVT Study Patient Name:  Brenda Stewart [facility]  Date of Exam:   05/05/2021 Medical Rec #: [id number]       Accession #:    [id number] Date of Birth: Sep 29, 1950      Patient Gender: F Patient Age:   9 years Exam Location:  [facility] Procedure:      VAS [place] LOWER EXTREMITY VENOUS (DVT) Referring Phys: Eulogio Bear --------------------------------------------------------------------------------  Indications: Pulmonary embolism.  Comparison Study: no prior Performing Technologist: Archie Patten RVS  Examination Guidelines: A complete evaluation includes B-mode imaging, spectral Doppler, color Doppler, and power Doppler as needed of all accessible portions of each vessel. Bilateral testing is considered an integral part of a complete examination. Limited examinations for reoccurring indications may be performed as noted. The reflux portion of the exam is performed with the patient in reverse Trendelenburg.  +---------+---------------+---------+-----------+----------+--------------+ RIGHT    CompressibilityPhasicitySpontaneityPropertiesThrombus Aging +---------+---------------+---------+-----------+----------+--------------+ CFV      Full           Yes      Yes                                 +---------+---------------+---------+-----------+----------+--------------+ SFJ      Full                                                         +---------+---------------+---------+-----------+----------+--------------+ FV Prox  Full                                                        +---------+---------------+---------+-----------+----------+--------------+  FV Mid   Full                                                        +---------+---------------+---------+-----------+----------+--------------+ FV DistalFull                                                        +---------+---------------+---------+-----------+----------+--------------+ PFV      Full                                                        +---------+---------------+---------+-----------+----------+--------------+ POP      Full           Yes      Yes                                 +---------+---------------+---------+-----------+----------+--------------+ PTV      Full                                                        +---------+---------------+---------+-----------+----------+--------------+ PERO     Full                                                        +---------+---------------+---------+-----------+----------+--------------+   +---------+---------------+---------+-----------+----------+--------------+ LEFT     CompressibilityPhasicitySpontaneityPropertiesThrombus Aging +---------+---------------+---------+-----------+----------+--------------+ CFV      Full           Yes      Yes                                 +---------+---------------+---------+-----------+----------+--------------+ SFJ      Full                                                        +---------+---------------+---------+-----------+----------+--------------+ FV Prox  Full                                                        +---------+---------------+---------+-----------+----------+--------------+ FV Mid   Full                                                         +---------+---------------+---------+-----------+----------+--------------+  FV DistalFull                                                        +---------+---------------+---------+-----------+----------+--------------+ PFV      Full                                                        +---------+---------------+---------+-----------+----------+--------------+ POP      Full           Yes      Yes                                 +---------+---------------+---------+-----------+----------+--------------+ PTV      Full                                                        +---------+---------------+---------+-----------+----------+--------------+ PERO     Full                                                        +---------+---------------+---------+-----------+----------+--------------+     Summary: BILATERAL: - No evidence of deep vein thrombosis seen in the lower extremities, bilaterally. -No evidence of popliteal cyst, bilaterally.   *See table(s) above for measurements and observations. Electronically signed by Harold Barban MD on 05/05/2021 at 1:26:43 PM.    Final         Scheduled Meds:  Chlorhexidine Gluconate Cloth  6 each Topical Daily   insulin aspart  0-15 Units Subcutaneous Q8H   pantoprazole (PROTONIX) IV  40 mg Intravenous Q12H   sodium chloride flush  10-40 mL Intracatheter Q12H   sodium chloride flush  10-40 mL Intracatheter Q12H   Continuous Infusions:  heparin 1,200 Units/hr (05/07/21 0551)   TPN ADULT (ION) 80 mL/hr at 05/07/21 0153     LOS: 16 days    Time spent: 25 minutes    Geradine Girt, DO [facility]   To contact the attending provider between 7A-7P or the covering provider during after hours 7P-7A, please log into the web site [email or web address] and access using universal [place] password for that web site. If you do not have the password, please call the hospital operator.  05/07/2021, 8:42 AM

## 2021-05-07 NOTE — H&P (View-Only) (Signed)
Nutrition Follow-up  DOCUMENTATION CODES:   Severe malnutrition in context of chronic illness  INTERVENTION:   -TPN management per Pharmacy  -Will monitor for diet advancement  NUTRITION DIAGNOSIS:   Severe Malnutrition related to chronic illness (SBO likely due to carcinomatosis) as evidenced by energy intake < or equal to 75% for > or equal to 1 month, moderate fat depletion, moderate muscle depletion.  Ongoing.  GOAL:   Patient will meet greater than or equal to 90% of their needs  Meeting with TPN  MONITOR:   Diet advancement, PO intake, Supplement acceptance, Labs, Weight trends, I & O's  ASSESSMENT:   70 yo female with a PMH of HTN and GERD who presents with SBO likely due to carcinomatosis. Pt with stated 30 pound weight loss over past 8 months.  7/31 - attempted paracentesis - unable to perform due to nonstop vomiting (rescheduled for 8/1) 8/01 - advanced to clear liquids  8/02 - made NPO; s/p paracentesis, 2.1L yield 8/03 - diet advanced back to clear liquids 8/4 -Regular diet started for dinner meals then back to NPO, transferred to [facility] for chemotherapy 8/8: TPN initiated. S/p dx lap, peritoneal biopsies 8/12: PICC line placed  Patient continues to receive TPN at 80 ml/hr, providing 2108 kcals and 106g protein.  Per oncology note, pt to discharge on TPN. Having EGD to determine if she can discharge on liquid diet. Will monitor.  Admission weight: 171 lbs. Current weight: 188 lbs.  I/Os: +13.2L since 8/1  Medications reviewed.  Labs reviewed: CBGs: 125-133  Diet Order:   Diet Order             Diet NPO time specified  Diet effective midnight                   EDUCATION NEEDS:   Education needs have been addressed  Skin:  Skin Assessment: Reviewed RN Assessment  Last BM:  8/7  Height:   Ht Readings from Last 1 Encounters:  04/30/21 '5\' 8"'$  (1.727 m)    Weight:   Wt Readings from Last 1 Encounters:  05/07/21 85.7 kg    BMI:   Body mass index is 28.73 kg/m.  Estimated Nutritional Needs:   Kcal:  2100-2300  Protein:  100-115g  Fluid:  >2L/day   Clayton Bibles, MS, RD, LDN Inpatient Clinical Dietitian Contact information available via Amion

## 2021-05-07 NOTE — Transfer of Care (Signed)
Immediate Anesthesia Transfer of Care Note  Patient: Brenda Stewart  Procedure(s) Performed: ESOPHAGOGASTRODUODENOSCOPY (EGD) WITH PROPOFOL ESOPHAGEAL BRUSHING BIOPSY  Patient Location: Endoscopy Unit  Anesthesia Type:MAC  Level of Consciousness: awake and alert   Airway & Oxygen Therapy: Patient Spontanous Breathing and Patient connected to face mask oxygen  Post-op Assessment: Report given to RN and Post -op Vital signs reviewed and stable  Post vital signs: Reviewed and stable  Last Vitals:  Vitals Value Taken Time  BP    Temp    Pulse    Resp    SpO2      Last Pain:  Vitals:   05/07/21 1326  TempSrc: Oral  PainSc: 0-No pain      Patients Stated Pain Goal: 0 (14/27/67 0110)  Complications: No notable events documented.

## 2021-05-07 NOTE — Progress Notes (Signed)
Nutrition Follow-up  DOCUMENTATION CODES:   Severe malnutrition in context of chronic illness  INTERVENTION:   -TPN management per Pharmacy  -Will monitor for diet advancement  NUTRITION DIAGNOSIS:   Severe Malnutrition related to chronic illness (SBO likely due to carcinomatosis) as evidenced by energy intake < or equal to 75% for > or equal to 1 month, moderate fat depletion, moderate muscle depletion.  Ongoing.  GOAL:   Patient will meet greater than or equal to 90% of their needs  Meeting with TPN  MONITOR:   Diet advancement, PO intake, Supplement acceptance, Labs, Weight trends, I & O's  ASSESSMENT:   70 yo female with a PMH of HTN and GERD who presents with SBO likely due to carcinomatosis. Pt with stated 30 pound weight loss over past 8 months.  7/31 - attempted paracentesis - unable to perform due to nonstop vomiting (rescheduled for 8/1) 8/01 - advanced to clear liquids  8/02 - made NPO; s/p paracentesis, 2.1L yield 8/03 - diet advanced back to clear liquids 8/4 -Regular diet started for dinner meals then back to NPO, transferred to [facility] for chemotherapy 8/8: TPN initiated. S/p dx lap, peritoneal biopsies 8/12: PICC line placed  Patient continues to receive TPN at 80 ml/hr, providing 2108 kcals and 106g protein.  Per oncology note, pt to discharge on TPN. Having EGD to determine if she can discharge on liquid diet. Will monitor.  Admission weight: 171 lbs. Current weight: 188 lbs.  I/Os: +13.2L since 8/1  Medications reviewed.  Labs reviewed: CBGs: 125-133  Diet Order:   Diet Order             Diet NPO time specified  Diet effective midnight                   EDUCATION NEEDS:   Education needs have been addressed  Skin:  Skin Assessment: Reviewed RN Assessment  Last BM:  8/7  Height:   Ht Readings from Last 1 Encounters:  04/30/21 '5\' 8"'$  (1.727 m)    Weight:   Wt Readings from Last 1 Encounters:  05/07/21 85.7 kg    BMI:   Body mass index is 28.73 kg/m.  Estimated Nutritional Needs:   Kcal:  2100-2300  Protein:  100-115g  Fluid:  >2L/day   Clayton Bibles, MS, RD, LDN Inpatient Clinical Dietitian Contact information available via Amion

## 2021-05-07 NOTE — Anesthesia Preprocedure Evaluation (Signed)
Anesthesia Evaluation  Patient identified by MRN, date of birth, ID band Patient awake    Reviewed: Allergy & Precautions, NPO status , Patient's Chart, lab work & pertinent test results  Airway Mallampati: II  TM Distance: >3 FB Neck ROM: Full    Dental   Pulmonary PE Pleural effusion Left side   breath sounds clear to auscultation       Cardiovascular hypertension, Pt. on medications  Rhythm:Regular Rate:Normal     Neuro/Psych negative neurological ROS     GI/Hepatic GERD  ,Peritoneal carcinomatosis with ascities   Endo/Other  negative endocrine ROS  Renal/GU Renal disease     Musculoskeletal   Abdominal   Peds  Hematology  (+) anemia ,   Anesthesia Other Findings   Reproductive/Obstetrics                             Anesthesia Physical Anesthesia Plan  ASA: 3  Anesthesia Plan: MAC   Post-op Pain Management:    Induction:   PONV Risk Score and Plan: 2 and Propofol infusion and Treatment may vary due to age or medical condition  Airway Management Planned: Natural Airway and Nasal Cannula  Additional Equipment:   Intra-op Plan:   Post-operative Plan:   Informed Consent: I have reviewed the patients History and Physical, chart, labs and discussed the procedure including the risks, benefits and alternatives for the proposed anesthesia with the patient or authorized representative who has indicated his/her understanding and acceptance.       Plan Discussed with:   Anesthesia Plan Comments:         Anesthesia Quick Evaluation

## 2021-05-08 ENCOUNTER — Encounter (HOSPITAL_COMMUNITY): Payer: Self-pay | Admitting: Gastroenterology

## 2021-05-08 ENCOUNTER — Telehealth: Payer: Self-pay | Admitting: Hematology

## 2021-05-08 ENCOUNTER — Inpatient Hospital Stay (HOSPITAL_COMMUNITY): Payer: Medicare Other

## 2021-05-08 DIAGNOSIS — K56609 Unspecified intestinal obstruction, unspecified as to partial versus complete obstruction: Secondary | ICD-10-CM | POA: Diagnosis not present

## 2021-05-08 LAB — HEPARIN LEVEL (UNFRACTIONATED)
Heparin Unfractionated: <0.1 IU/mL — ABNORMAL LOW (ref 0.30–0.70)
Heparin Unfractionated: 0.42 IU/mL (ref 0.30–0.70)
Heparin Unfractionated: 0.6 IU/mL (ref 0.30–0.70)

## 2021-05-08 LAB — CBC
HCT: 27.3 % — ABNORMAL LOW (ref 36.0–46.0)
Hemoglobin: 8.7 g/dL — ABNORMAL LOW (ref 12.0–15.0)
MCH: 30.1 pg (ref 26.0–34.0)
MCHC: 31.9 g/dL (ref 30.0–36.0)
MCV: 94.5 fL (ref 80.0–100.0)
Platelets: 339 10*3/uL (ref 150–400)
RBC: 2.89 MIL/uL — ABNORMAL LOW (ref 3.87–5.11)
RDW: 13.5 % (ref 11.5–15.5)
WBC: 7.7 10*3/uL (ref 4.0–10.5)
nRBC: 0 % (ref 0.0–0.2)

## 2021-05-08 LAB — GLUCOSE, CAPILLARY: Glucose-Capillary: 123 mg/dL — ABNORMAL HIGH (ref 70–99)

## 2021-05-08 LAB — SURGICAL PATHOLOGY

## 2021-05-08 LAB — CYTOLOGY - NON PAP

## 2021-05-08 MED ORDER — TRAVASOL 10 % IV SOLN
INTRAVENOUS | Status: AC
  Filled 2021-05-08: qty 1056

## 2021-05-08 MED ORDER — INSULIN ASPART 100 UNIT/ML IJ SOLN
0.0000 [IU] | INTRAMUSCULAR | Status: DC
Start: 2021-05-08 — End: 2021-05-09
  Administered 2021-05-08 – 2021-05-09 (×2): 1 [IU] via SUBCUTANEOUS

## 2021-05-08 NOTE — TOC Benefit Eligibility Note (Signed)
Transition of Care [facility]) Benefit Eligibility Note    Patient Details  Name: Brenda Stewart MRN: [id number] Date of Birth: 01/27/1951   Medication/Dose: Xarelto 5 mg 2 x day and Xarelto 20 mg 1 x day both for 30 day supply at local pharmacy  Covered?: Yes  Tier: Other  Prescription Coverage Preferred Pharmacy: local or mail order  Spoke with Person/Company/Phone Number:: [employer] [phone]  Co-Pay: Eliquis and Xarelto are both $38.00 at local Pharmacy and $34.00 for 90 day supply mail order  Prior Approval: No  Deductible: Met       [name] Phone Number: 05/08/2021, 11:11 AM

## 2021-05-08 NOTE — Telephone Encounter (Signed)
Scheduled appts per 8/15 sch msg. Called pt, no answer. Left msg with appts dates and times.  

## 2021-05-08 NOTE — Progress Notes (Addendum)
HEMATOLOGY-ONCOLOGY PROGRESS NOTE  SUBJECTIVE: The patient reports mild abdominal discomfort this morning.  Denies need for pain medication.  She denies nausea and vomiting.  Continues to have frequent loose bowel movements.  She is taking and sips of clear liquids.  Oncology History  Primary peritoneal carcinomatosis ([place])  04/25/2021 Initial Diagnosis   Primary peritoneal carcinomatosis ([place])   04/27/2021 - 04/27/2021 Chemotherapy          05/04/2021 -  Chemotherapy    Patient is on Treatment Plan: GASTRIC FOLFOX Q14D X 12 CYCLES        REVIEW OF SYSTEMS:   Constitutional: Denies fevers, chills  Eyes: Denies blurriness of vision Ears, nose, mouth, throat, and face: Denies mucositis or sore throat Respiratory: Denies cough, dyspnea or wheezes Cardiovascular: Denies palpitation, chest discomfort Gastrointestinal: Reports mild abdominal pain this morning.  Denies nausea vomiting.  Continues to have frequent loose bowel movements. Skin: Denies abnormal skin rashes Lymphatics: Denies new lymphadenopathy or easy bruising Neurological:Denies numbness, tingling or new weaknesses Behavioral/Psych: Mood is stable, no new changes  Extremities: No lower extremity edema All other systems were reviewed with the patient and are negative.  I have reviewed the past medical history, past surgical history, social history and family history with the patient and they are unchanged from previous note.   PHYSICAL EXAMINATION: ECOG PERFORMANCE STATUS: 2 - Symptomatic, <50% confined to bed  Vitals:   05/08/21 0349 05/08/21 0612  BP: 115/68 114/71  Pulse: 96 89  Resp: 20 17  Temp: 98.6 F (37 C) 98 F (36.7 C)  SpO2: 98% 97%   Filed Weights   05/06/21 0500 05/07/21 0518 05/08/21 0500  Weight: 83.8 kg 85.7 kg 82.1 kg    Intake/Output from previous day: 08/15 0701 - 08/16 0700 In: 1290.4 [P.O.:237; I.V.:1053.4] Out: -   GENERAL:alert, no distress and comfortable SKIN: skin color,  texture, turgor are normal, no rashes or significant lesions EYES: normal, Conjunctiva are pink and non-injected, sclera clear OROPHARYNX:no exudate, no erythema and lips, buccal mucosa, and tongue normal  LYMPH:  no palpable lymphadenopathy in the cervical, axillary or inguinal LUNGS: clear to auscultation and percussion with normal breathing effort HEART: regular rate & rhythm and no murmurs and no lower extremity edema ABDOMEN:abdomen soft, non-tender and normal bowel sounds Musculoskeletal:no cyanosis of digits and no clubbing  NEURO: alert & oriented x 3 with fluent speech, no focal motor/sensory deficits  LABORATORY DATA:  I have reviewed the data as listed CMP Latest Ref Rng & Units 05/07/2021 05/06/2021 05/05/2021  Glucose 70 - 99 mg/dL 131(H) 123(H) 169(H)  BUN 8 - 23 mg/dL 24(H) 22 19  Creatinine 0.44 - 1.00 mg/dL 0.76 0.88 0.69  Sodium 135 - 145 mmol/L 135 134(L) 136  Potassium 3.5 - 5.1 mmol/L 4.0 4.3 4.7  Chloride 98 - 111 mmol/L 104 102 105  CO2 22 - 32 mmol/L '25 24 22  '$ Calcium 8.9 - 10.3 mg/dL 8.5(L) 8.4(L) 9.0  Total Protein 6.5 - 8.1 g/dL 6.2(L) - -  Total Bilirubin 0.3 - 1.2 mg/dL 0.4 - -  Alkaline Phos 38 - 126 U/L 56 - -  AST 15 - 41 U/L 18 - -  ALT 0 - 44 U/L 11 - -    Lab Results  Component Value Date   WBC 8.0 05/07/2021   HGB 9.0 (L) 05/07/2021   HCT 27.9 (L) 05/07/2021   MCV 93.3 05/07/2021   PLT 354 05/07/2021   NEUTROABS 5.8 05/07/2021    CT ABDOMEN PELVIS  WO CONTRAST  Result Date: 04/21/2021 CLINICAL DATA:  Nausea and vomiting for 2 days. Lower abdominal pain for approximately 3 months. Chronic constipation. EXAM: CT ABDOMEN AND PELVIS WITHOUT CONTRAST TECHNIQUE: Multidetector CT imaging of the abdomen and pelvis was performed following the standard protocol without IV contrast. COMPARISON:  None. FINDINGS: Lower chest: No acute findings. Hepatobiliary: No mass visualized on this unenhanced exam. Gallbladder is unremarkable. No evidence of biliary  ductal dilatation. Pancreas: No mass or inflammatory process visualized on this unenhanced exam. Spleen:  Within normal limits in size. Adrenals/Urinary tract: No evidence of urolithiasis or hydronephrosis. Unremarkable unopacified urinary bladder. Stomach/Bowel: Small hiatal hernia is seen containing ascites. Moderate diffuse small bowel dilatation is seen, with suspected transition point in the right lower quadrant, suspicious for distal small bowel obstruction. Vascular/Lymphatic: No pathologically enlarged lymph nodes identified. No evidence of abdominal aortic aneurysm. Aortic atherosclerotic calcification noted. Reproductive: Prior hysterectomy suspected. Moderate ascites is seen. Omental caking and diffuse mesenteric soft tissue stranding is seen. There is also probable peritoneal nodularity along the left pelvic sidewall. These findings are highly suspicious for peritoneal carcinomatosis. Other:  None. Musculoskeletal:  No suspicious bone lesions identified. 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 Suspected distal small bowel obstruction, with probable transition point in right lower quadrant. Aortic Atherosclerosis (ICD10-I70.0). Electronically Signed   By: Marlaine Hind M.D.   On: 04/21/2021 18:36   CT CHEST W CONTRAST  Result Date: 05/04/2021 CLINICAL DATA:  GI stromal cancer staging. Peritoneal carcinomatosis. Right Port-A-Cath tip: SVC. EXAM: CT CHEST WITH CONTRAST TECHNIQUE: Multidetector CT imaging of the chest was performed during intravenous contrast administration. CONTRAST:  60m OMNIPAQUE IOHEXOL 350 MG/ML SOLN COMPARISON:  CT abdomen 04/29/2021 FINDINGS: Cardiovascular: Today's exam is not optimized to assess for pulmonary embolus, but there is suspicion for mild segmental filling defect in the anterior segment right upper lobe and apical segment right upper lobe concerning for pulmonary embolus. Clot burden is small. Right ventricular to left ventricular  ratio 1.1. Mediastinum/Nodes: Multiple hypodense thyroid nodules measure up to 2.1 cm in diameter. Recommend thyroid [place](ref: J Am Coll Radiol. 2015 Feb;12(2): 143-50).Distal paraesophageal lymph node 0.8 cm in short axis on image 113 series 2. No overtly pathologic thoracic adenopathy is identified. Lungs/Pleura: Small left pleural effusion (nonspecific for transudative or exudative etiology) with passive atelectasis in the left lower lobe. Subsegmental atelectasis or scarring along the right hemidiaphragm. No worrisome pulmonary nodules. Upper Abdomen: Left upper quadrant ascites along the lesser sac and below the diaphragm with some infiltration of the adjacent omentum. Small focal hypodensities posteriorly along the right hepatic lobe as on recent CT abdomen. Periportal edema. These findings are similar to recent CT abdomen although the amount of left upper quadrant ascites is reduced. There is gas density in the subcutaneous tissues of the left upper abdomen and lower chest. Musculoskeletal: Degenerative glenohumeral arthropathy, right greater than left. Lower cervical and thoracic spondylosis. IMPRESSION: 1. Suspected segmental filling defects in the anterior segment right upper lobe and apical segment right upper lobe compatible with small pulmonary emboli. Clot burden appears small, although please note that today's examination was not a dedicated CT angiogram and as such might overestimate or underestimate pulmonary embolus. Positive for acute PE with CT evidence of right heart strain (RV/LV Ratio = 1.1) consistent with at least submassive (intermediate risk) PE. The presence of right heart strain has been associated with an increased risk of morbidity and mortality. Please refer to the "PE Focused" order set in EPIC. 2.  No compelling findings of metastatic disease to the chest. There is a small left pleural effusion, but no definite enhancement along the pleura to suggest malignant or exudative etiology at  this time. 3. There is some new gas in the soft tissues of the left thoracoabdominal wall, compatible subcutaneous emphysema, probably related to recent laparoscopy or other procedure. 4. Hypodense thyroid nodules up to 2.1 cm in diameter. Recommend thyroid US (ref: J Am Coll Radiol. 2015 Feb;12(2): 143-50). 5. Left upper quadrant ascites, including fluid in the lesser sac. This is reduced compared to the prior CT abdomen of 04/29/2021. 6. Stable appearance of periportal edema and hypodensities along the posterior margin of the right hepatic lobe. Critical Value/emergent results were called by telephone at the time of interpretation on 05/04/2021 at 12:53 pm to provider [facility] , who verbally acknowledged these results. Electronically Signed   By: Van Clines M.D.   On: 05/04/2021 12:53   CT ABDOMEN PELVIS W CONTRAST  Result Date: 04/29/2021 CLINICAL DATA:  70 year old female for staging of peritoneal carcinomatosis prior to chemotherapy. EXAM: CT ABDOMEN AND PELVIS WITH CONTRAST TECHNIQUE: Multidetector CT imaging of the abdomen and pelvis was performed using the standard protocol following bolus administration of intravenous contrast. CONTRAST:  91m OMNIPAQUE IOHEXOL 350 MG/ML SOLN COMPARISON:  04/21/2021 noncontrast study. FINDINGS: Lower chest: A small LEFT pleural effusion with LEFT LOWER lung atelectasis is now noted. A trace RIGHT pleural effusion with mild RIGHT basilar atelectasis is identified. Hepatobiliary: Small hypodense lesions along the INFERIOR RIGHT liver are compatible with subserosal implants. No other hepatic abnormalities are present. The gallbladder appears mildly thick walled. No biliary dilatation. Pancreas: Unremarkable Spleen: Unremarkable Adrenals/Urinary Tract: The kidneys, adrenal glands and bladder are unremarkable. Stomach/Bowel: No dilated bowel loops are noted. No definite focal bowel wall thickening is identified. Vascular/Lymphatic: Aortic atherosclerosis. No  enlarged abdominal or pelvic lymph nodes. Reproductive: Patient is status post hysterectomy. No definite adnexal masses are noted. Other: Moderate ascites within the anterior/mid abdomen extending into the pelvis are noted. There is mild peritoneal thickening/minimal peritoneal irregularity noted in several areas. There are smaller focal fluid/cystic areas within the RIGHT abdomen and LOWER pelvis. Diffuse omental thickening/caking is noted. Mild stranding within the mesentery is also present. No evidence of pneumoperitoneum. Musculoskeletal: No acute or suspicious bony abnormalities are identified. IMPRESSION: 1. Moderate ascites, mild peritoneal thickening and diffuse omental thickening/caking and smaller focal fluid/cystic areas within the RIGHT abdomen and LOWER pelvis, compatible with peritoneal carcinomatosis. Mild mesenteric stranding is nonspecific. 2. Small subserosal implants along the INFERIOR RIGHT liver. 3. Small LEFT pleural effusion and trace RIGHT pleural effusion with LEFT LOWER lung atelectasis. 4. Aortic Atherosclerosis (ICD10-I70.0). Electronically Signed   By: JMargarette CanadaM.D.   On: 04/29/2021 12:16   [place] Limited  Result Date: 04/22/2021 CLINICAL DATA:  Peritoneal neoplasm abdominal distension, recurrent ascites EXAM: LIMITED ABDOMEN ULTRASOUND FOR ASCITES TECHNIQUE: Limited ultrasound survey for ascites was performed in all four abdominal quadrants. COMPARISON:  04/21/2021 CT FINDINGS: Survey of the abdominal 4 quadrants demonstrates a small volume of lower abdominal ascites by ultrasound. Unable to perform paracentesis because of intractable nausea vomiting today. IMPRESSION: Small volume of lower abdominopelvic ascites. Will reassess for paracentesis tomorrow 04/23/2021. Electronically Signed   By: MJerilynn Mages  Shick M.D.   On: 04/22/2021 12:09   DG Abd 2 Views  Result Date: 04/28/2021 CLINICAL DATA:  Small bowel obstruction. EXAM: ABDOMEN - 2 VIEW COMPARISON:  04/26/2021 and earlier  FINDINGS: There is focal atelectasis or. No  free intraperitoneal consolidation MEDIAL LEFT LOWER lobe air. Persistent mild dilatation of small bowel loops in the LEFT central abdomen, similar in appearance to previous CT exam. Air is identified within nondilated loops of colon. There are degenerative changes in both hips. IMPRESSION: 1. Persistent mild dilatation of small bowel loops in the LEFT central abdomen. 2. LEFT LOWER lobe atelectasis or consolidation. Electronically Signed   By: Nolon Nations M.D.   On: 04/28/2021 10:13   DG Abd 2 Views  Result Date: 04/26/2021 CLINICAL DATA:  Follow-up small bowel obstruction. EXAM: ABDOMEN - 2 VIEW COMPARISON:  A2 22 FINDINGS: There is mild residual gaseous distension of multiple small bowel loops, decreased from prior. A small amount of gas is scattered in the colon and rectum. No intraperitoneal free air is identified. Atelectasis is noted in the left lung base. IMPRESSION: Decreased small bowel distension compatible with improving obstruction. Electronically Signed   By: Logan Bores M.D.   On: 04/26/2021 12:45   DG Abd Portable 1V  Result Date: 04/24/2021 CLINICAL DATA:  Small-bowel obstruction. EXAM: PORTABLE ABDOMEN - 1 VIEW COMPARISON:  04/22/2021.  CT 04/21/2021. FINDINGS: Mild-to-moderately distended loops of small bowel with a relative paucity of colonic gas again noted. Findings again consistent with small bowel obstruction. No free air. Ascites most likely present. Pelvic calcifications consistent phleboliths. Degenerative changes lumbar spine and both hips. IMPRESSION: Mild-to-moderate small-bowel distention with a paucity of intra colonic air again noted. Findings again consistent with small bowel obstruction. Ascites most likely present. Electronically Signed   By: Marcello Moores  Register   On: 04/24/2021 11:08   DG Abd Portable 1V-Small Bowel Obstruction Protocol-initial, 8 hr delay  Result Date: 04/22/2021 CLINICAL DATA:  Small bowel obstruction, 8  hour film. EXAM: PORTABLE ABDOMEN - 1 VIEW COMPARISON:  CT yesterday. FINDINGS: Administered enteric contrast has reached the ascending and proximal transverse colon. There is enteric contrast within prominent small bowel in the central abdomen. Left upper quadrant contrast may be residual within small bowel or at the splenic flexure. No evidence of free air. IMPRESSION: Administered enteric contrast has reached the ascending and proximal transverse colon. There is enteric contrast within prominent small bowel in the central abdomen. Left upper quadrant contrast may be residual within small bowel or at the splenic flexure. Electronically Signed   By: Keith Rake M.D.   On: 04/22/2021 17:38   ECHOCARDIOGRAM COMPLETE  Result Date: 05/05/2021    ECHOCARDIOGRAM REPORT   Patient Name:   RANDEE BIERLY [facility] Date of Exam: 05/05/2021 Medical Rec #:  [id number]      Height:       68.0 in Accession #:    [id number]     Weight:       189.4 lb Date of Birth:  1951-05-04     BSA:          1.997 m Patient Age:    70 years       BP:           129/80 mmHg Patient Gender: F              HR:           90 bpm. Exam Location:  Inpatient Procedure: 2D Echo, Cardiac Doppler and Color Doppler Indications:    Pulmonary Embolus I26.09  History:        Patient has no prior history of Echocardiogram examinations.                 Risk Factors:Hypertension.  Sonographer:    Darlina Sicilian RDCS Referring Phys: [place]  1. Left ventricular ejection fraction, by estimation, is 60 to 65%. The left ventricle has normal function. The left ventricle has no regional wall motion abnormalities. There is mild left ventricular hypertrophy of the basal-septal segment. Left ventricular diastolic parameters are consistent with Grade I diastolic dysfunction (impaired relaxation).  2. Right ventricular systolic function is normal. The right ventricular size is normal.  3. Moderate pleural effusion in the left lateral region.  4. The  mitral valve is normal in structure. Trivial mitral valve regurgitation. No evidence of mitral stenosis.  5. The aortic valve is tricuspid. Aortic valve regurgitation is not visualized. Mild aortic valve sclerosis is present, with no evidence of aortic valve stenosis.  6. The inferior vena cava is normal in size with greater than 50% respiratory variability, suggesting right atrial pressure of 3 mmHg. FINDINGS  Left Ventricle: Left ventricular ejection fraction, by estimation, is 60 to 65%. The left ventricle has normal function. The left ventricle has no regional wall motion abnormalities. The left ventricular internal cavity size was normal in size. There is  mild left ventricular hypertrophy of the basal-septal segment. Left ventricular diastolic parameters are consistent with Grade I diastolic dysfunction (impaired relaxation). Right Ventricle: The right ventricular size is normal. Right ventricular systolic function is normal. Left Atrium: Left atrial size was normal in size. Right Atrium: Right atrial size was normal in size. Pericardium: There is no evidence of pericardial effusion. Mitral Valve: The mitral valve is normal in structure. Trivial mitral valve regurgitation. No evidence of mitral valve stenosis. Tricuspid Valve: The tricuspid valve is normal in structure. Tricuspid valve regurgitation is trivial. No evidence of tricuspid stenosis. Aortic Valve: The aortic valve is tricuspid. Aortic valve regurgitation is not visualized. Mild aortic valve sclerosis is present, with no evidence of aortic valve stenosis. Pulmonic Valve: The pulmonic valve was normal in structure. Pulmonic valve regurgitation is trivial. No evidence of pulmonic stenosis. Aorta: The aortic root is normal in size and structure. Venous: The inferior vena cava is normal in size with greater than 50% respiratory variability, suggesting right atrial pressure of 3 mmHg. IAS/Shunts: No atrial level shunt detected by color flow Doppler.  Additional Comments: There is a moderate pleural effusion in the left lateral region.  LEFT VENTRICLE PLAX 2D LVIDd:         3.90 cm  Diastology LVIDs:         2.90 cm  LV e' medial:    6.18 cm/s LV PW:         0.80 cm  LV E/e' medial:  8.6 LV IVS:        1.40 cm  LV e' lateral:   9.03 cm/s LVOT diam:     2.10 cm  LV E/e' lateral: 5.9 LV SV:         61 LV SV Index:   31 LVOT Area:     3.46 cm  RIGHT VENTRICLE RV S prime:     14.40 cm/s TAPSE (M-mode): 1.1 cm LEFT ATRIUM             Index       RIGHT ATRIUM           Index LA diam:        2.70 cm 1.35 cm/m  RA Area:     13.60 cm LA Vol (A2C):   25.1 ml 12.57 ml/m RA Volume:   28.20 ml  14.12  ml/m LA Vol (A4C):   16.0 ml 8.01 ml/m LA Biplane Vol: 20.0 ml 10.02 ml/m  AORTIC VALVE LVOT Vmax:   113.50 cm/s LVOT Vmean:  74.400 cm/s LVOT VTI:    0.177 m  AORTA Ao Root diam: 3.00 cm Ao Asc diam:  3.25 cm MITRAL VALVE MV Area (PHT): 3.78 cm    SHUNTS MV Decel Time: 201 msec    Systemic VTI:  0.18 m MV E velocity: 53.35 cm/s  Systemic Diam: 2.10 cm MV A velocity: 88.05 cm/s MV E/A ratio:  0.61 Kirk Ruths MD Electronically signed by Kirk Ruths MD Signature Date/Time: 05/05/2021/10:24:12 AM    Final    IR IMAGING GUIDED PORT INSERTION  Result Date: 04/26/2021 CLINICAL DATA:  Peritoneal carcinomatosis, unknown primary, access for chemotherapy EXAM: RIGHT INTERNAL JUGULAR SINGLE LUMEN POWER PORT CATHETER INSERTION Date:  04/26/2021 04/26/2021 2:15 pm Radiologist:  Jerilynn Mages. Daryll Brod, MD Guidance:  Ultrasound and fluoroscopic MEDICATIONS: 1% lidocaine with epinephrine local ANESTHESIA/SEDATION: Versed 2.0 mg IV; Fentanyl 100 mcg IV; Moderate Sedation Time:  25 minutes The patient was continuously monitored during the procedure by the interventional radiology nurse under my direct supervision. FLUOROSCOPY TIME:  One minutes, 0 seconds (5 mGy) COMPLICATIONS: None immediate. CONTRAST:  None. PROCEDURE: Informed consent was obtained from the patient following explanation of  the procedure, risks, benefits and alternatives. The patient understands, agrees and consents for the procedure. All questions were addressed. A time out was performed. Maximal barrier sterile technique utilized including caps, mask, sterile gowns, sterile gloves, large sterile drape, hand hygiene, and 2% chlorhexidine scrub. Under sterile conditions and local anesthesia, right internal jugular micropuncture venous access was performed. Access was performed with ultrasound. Images were obtained for documentation of the patent right internal jugular vein. A guide wire was inserted followed by a transitional dilator. This allowed insertion of a guide wire and catheter into the IVC. Measurements were obtained from the SVC / RA junction back to the right IJ venotomy site. In the right infraclavicular chest, a subcutaneous pocket was created over the second anterior rib. This was done under sterile conditions and local anesthesia. 1% lidocaine with epinephrine was utilized for this. A 2.5 cm incision was made in the skin. Blunt dissection was performed to create a subcutaneous pocket over the right pectoralis major muscle. The pocket was flushed with saline vigorously. There was adequate hemostasis. The port catheter was assembled and checked for leakage. The port catheter was secured in the pocket with two retention sutures. The tubing was tunneled subcutaneously to the right venotomy site and inserted into the SVC/RA junction through a valved peel-away sheath. Position was confirmed with fluoroscopy. Images were obtained for documentation. The patient tolerated the procedure well. No immediate complications. Incisions were closed in a two layer fashion with 4 - 0 Vicryl suture. Dermabond was applied to the skin. The port catheter was accessed, blood was aspirated followed by saline and heparin flushes. Needle was removed. A dry sterile dressing was applied. IMPRESSION: Ultrasound and fluoroscopically guided right  internal jugular single lumen power port catheter insertion. Tip in the SVC/RA junction. Catheter ready for use. Electronically Signed   By: Jerilynn Mages.  Shick M.D.   On: 04/26/2021 14:22   VAS [place] LOWER EXTREMITY VENOUS (DVT)  Result Date: 05/05/2021  Lower Venous DVT Study Patient Name:  MAGON BOULTON [facility]  Date of Exam:   05/05/2021 Medical Rec #: [id number]       Accession #:    [id number] Date of Birth: Oct 04, 1950  Patient Gender: F Patient Age:   16 years Exam Location:  [facility] Procedure:      VAS [place] LOWER EXTREMITY VENOUS (DVT) Referring Phys: Eulogio Bear --------------------------------------------------------------------------------  Indications: Pulmonary embolism.  Comparison Study: no prior Performing Technologist: Archie Patten RVS  Examination Guidelines: A complete evaluation includes B-mode imaging, spectral Doppler, color Doppler, and power Doppler as needed of all accessible portions of each vessel. Bilateral testing is considered an integral part of a complete examination. Limited examinations for reoccurring indications may be performed as noted. The reflux portion of the exam is performed with the patient in reverse Trendelenburg.  +---------+---------------+---------+-----------+----------+--------------+ RIGHT    CompressibilityPhasicitySpontaneityPropertiesThrombus Aging +---------+---------------+---------+-----------+----------+--------------+ CFV      Full           Yes      Yes                                 +---------+---------------+---------+-----------+----------+--------------+ SFJ      Full                                                        +---------+---------------+---------+-----------+----------+--------------+ FV Prox  Full                                                        +---------+---------------+---------+-----------+----------+--------------+ FV Mid   Full                                                         +---------+---------------+---------+-----------+----------+--------------+ FV DistalFull                                                        +---------+---------------+---------+-----------+----------+--------------+ PFV      Full                                                        +---------+---------------+---------+-----------+----------+--------------+ POP      Full           Yes      Yes                                 +---------+---------------+---------+-----------+----------+--------------+ PTV      Full                                                        +---------+---------------+---------+-----------+----------+--------------+ PERO  Full                                                        +---------+---------------+---------+-----------+----------+--------------+   +---------+---------------+---------+-----------+----------+--------------+ LEFT     CompressibilityPhasicitySpontaneityPropertiesThrombus Aging +---------+---------------+---------+-----------+----------+--------------+ CFV      Full           Yes      Yes                                 +---------+---------------+---------+-----------+----------+--------------+ SFJ      Full                                                        +---------+---------------+---------+-----------+----------+--------------+ FV Prox  Full                                                        +---------+---------------+---------+-----------+----------+--------------+ FV Mid   Full                                                        +---------+---------------+---------+-----------+----------+--------------+ FV DistalFull                                                        +---------+---------------+---------+-----------+----------+--------------+ PFV      Full                                                         +---------+---------------+---------+-----------+----------+--------------+ POP      Full           Yes      Yes                                 +---------+---------------+---------+-----------+----------+--------------+ PTV      Full                                                        +---------+---------------+---------+-----------+----------+--------------+ PERO     Full                                                        +---------+---------------+---------+-----------+----------+--------------+  Summary: BILATERAL: - No evidence of deep vein thrombosis seen in the lower extremities, bilaterally. -No evidence of popliteal cyst, bilaterally.   *See table(s) above for measurements and observations. Electronically signed by Harold Barban MD on 05/05/2021 at 1:26:43 PM.    Final    [place] EKG SITE RITE  Result Date: 05/04/2021 If Site Rite image not attached, placement could not be confirmed due to current cardiac rhythm.  IR Paracentesis  Result Date: 04/24/2021 INDICATION: Abdominal distention secondary to ascites. Underlying concern for peritoneal carcinomatosis on imaging findings. Request for diagnostic and therapeutic paracentesis. EXAM: ULTRASOUND GUIDED LEFT LOWER QUADRANT PARACENTESIS MEDICATIONS: 1% plain lidocaine, 5 mL COMPLICATIONS: None immediate. PROCEDURE: Informed written consent was obtained from the patient after a discussion of the risks, benefits and alternatives to treatment. A timeout was performed prior to the initiation of the procedure. Initial ultrasound scanning demonstrates a large amount of ascites within the left lower abdominal quadrant. The left lower abdomen was prepped and draped in the usual sterile fashion. 1% lidocaine was used for local anesthesia. Following this, a 19 gauge, 7-cm, Yueh catheter was introduced. An ultrasound image was saved for documentation purposes. The paracentesis was performed. The catheter was removed and a dressing was  applied. The patient tolerated the procedure well without immediate post procedural complication. FINDINGS: A total of approximately 2.1 L of clear yellow fluid was removed. Samples were sent to the laboratory as requested by the clinical team. IMPRESSION: Successful ultrasound-guided paracentesis yielding 2.1 liters of peritoneal fluid. Read by: Ascencion Dike PA-C Electronically Signed   By: Michaelle Birks MD   On: 04/24/2021 12:38    ASSESSMENT AND PLAN: 1.  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  Malignant ascites 3.  Protein calorie malnutrition 4.  Mild anemia 5.  Hypertension 6.  Pulmonary embolism  -Tolerated her first cycle of chemotherapy well overall.  Continue supportive care.  Second cycle of chemotherapy has been scheduled as an outpatient on 05/21/2021.  We have also arranged a follow-up appointment in approximately 1 week on 05/16/2021. -No obvious malignancy noted in the esophagus, stomach, or duodenum on EGD performed 05/07/2021.  She had a few small lesions which were biopsied and pathology is pending.  We will follow-up on this pathology. -She may continue on a liquid diet to see how she tolerates.  She should stop this if she has recurrent nausea or vomiting. -The patient will need to go home with TPN. Her PICC line will need to remain in place because she will need to have TPN and chemotherapy.  We will consider discontinuation of her PICC line in about a month if she can start taking in oral nutrition. -May transition to Eliquis or Xarelto on discharge for PE.   LOS: 17 days   [name], DNP, AGPCNP-BC, AOCNP 05/08/21  Addendum  Pt is tolerating liquid well, mild intermittent low abdomen discomfort, she has good BM, no other complains. She will likely go home tomorrow and we have set up her f/u with [place] next week. I encouraged her to continue liquid diet and no rash to advance yet. She agrees.   [name]  05/08/2021

## 2021-05-08 NOTE — Progress Notes (Signed)
[place] for Heparin Indication: pulmonary embolus  No Known Allergies  Patient Measurements: Height: '5\' 8"'$  (172.7 cm) Weight: 82.1 kg (181 lb) IBW/kg (Calculated) : 63.9 Heparin Dosing Weight: 81 kg  Vital Signs: Temp: 98 F (36.7 C) (08/16 0612) Temp Source: Oral (08/16 0612) BP: 114/71 (08/16 0612) Pulse Rate: 89 (08/16 0612)  Labs: Recent Labs    05/06/21 0549 05/07/21 0549 05/08/21 0130 05/08/21 1041  HGB 9.5* 9.0*  --  8.7*  HCT 29.2* 27.9*  --  27.3*  PLT 386 354  --  339  HEPARINUNFRC 0.40 0.21* <0.10* 0.42  CREATININE 0.88 0.76  --   --     Estimated Creatinine Clearance: 75.7 mL/min (by C-G formula based on SCr of 0.76 mg/dL).  Medical History: Past Medical History:  Diagnosis Date   Acid reflux    Hypertension    Medications:  Scheduled:   Chlorhexidine Gluconate Cloth  6 each Topical Daily   insulin aspart  0-9 Units Subcutaneous 4 times per day   pantoprazole (PROTONIX) IV  40 mg Intravenous Q12H   sodium chloride flush  10-40 mL Intracatheter Q12H   sodium chloride flush  10-40 mL Intracatheter Q12H   Infusions:   heparin 1,550 Units/hr (05/08/21 0625)   lactated ringers 10 mL/hr at 05/08/21 0300   TPN ADULT (ION) 80 mL/hr at Q000111Q Q000111Q   TPN CYCLIC-ADULT (ION)     Assessment: 37 yoF with peritoneal carcinomatosis - now identified as Signet ring Ca, Chemo Folfox to begin 8/12.  NPO since admit for SBO, Diagnostic Lap 8/8, TPN started 8/8.  8/12 CT w/contrast for staging > PE. Pharmacy consulted to dose/monitor heparin drip  Today, 05/08/21 HL = 0.42 is therapeutic on heparin infusion of 1550 units/hr CBC: Hgb (8.7) low but relatively stable; Plt WNL & stable Confirmed with RN that heparin infusing at correct rate. No interruptions. No signs of bleeding.   Goal of Therapy:  Heparin level 0.3-0.7 units/ml Monitor platelets by anticoagulation protocol: Yes   Plan:  Continue heparin infusion at  current rate of 1550 units/hr Check confirmatory 6 hour HL CBC, HL daily while on heparin infusion Monitor for signs of bleeding  Lenis Noon PharmD 05/08/2021,11:41 AM

## 2021-05-08 NOTE — Progress Notes (Addendum)
PROGRESS NOTE    Brenda Stewart  [id number] DOB: 10-13-1950 DOA: 04/21/2021 PCP: Pcp, No      Brief Narrative:  Brenda Stewart is a 70 y/o F with HTN, GERD who presented with abdominal pain and was found to have SBO due to peritoneal carcinomatosis.  Patient transferred to [facility] to start chemotherapy.  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  Suspected GI source so she is s/p EGD.  Nutrition through TPN.  Incidentally found PE on CT scan.  Heparin started.  Change to eliquis in AM.  24-28 hour until D/C-- need home health for TPN etc.     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 GI source -Status post paracentesis- cytology unrevealing -Patient status post port placement 04/26/2021. -FOLFOX s/p 1 cycle -GI consulted for EGD on 8/15 - - White plaque like lesions in the distal 1/3 esophageal mucosa-cells for cytology obtained -Per oncology.  Malignant ascites -Status post paracentesis.  Left pleural effusion -monitor, may need thoracentesis if worsens -not requiring O2  Incidental PE -duplex LE negative -no RHS on echo -heparin gtt- change to eliquis on 8/17 - if U/S doe snot show abscess  Acute renal failure -Secondary to prerenal azotemia.   -Resolved with hydration.    Prolonged QT interval -Repeat EKG with resolution of  QT prolongation. -Keep potassium close to 4, magnesium close to 2. -Follow.  Severe protein calorie malnutrition/unintended weight loss -Likely secondary to malignancy -Patient currently n.p.o. secondary to problem #1- defer to oncology PO intake -TPN started  Thrombocytosis -Resolved.  Hypomagnesemia -Repleted  Iron deficiency anemia/folate deficiency -Anemia panel consistent with iron deficiency anemia and folate deficiency. -Folate level high at 5.0, iron at 18, ferritin on 415.  Vitamin B12 at 952. -Hemoglobin stable  -Transfusion threshold hemoglobin < 7.   Lump on right forearm -enlarging and warm per patient -prior IV site -U/S consistent with superficial thrombophlebitis    DVT prophylaxis: Heparin gtt- change to eliquis in AM Code Status: Full Family Communication: Updated patient.  No family at bedside. Disposition:   Status is: Inpatient  Remains inpatient appropriate because:Inpatient level of care appropriate due to severity of illness  Dispo: The patient is from: Home              Anticipated d/c is to: Home              Patient currently is not medically stable to d/c.-  U/S of arm, TPN set up for home, 24- 48 hours   Difficult to place patient No       Consultants:  GS:  Oncology: GYN oncology:  GI  Procedures:  CT abdomen pelvis 04/21/2021 Abdominal films 04/22/2021, 04/24/2021, 04/26/2021 Abdominal ultrasound 04/22/2021 Ultrasound-guided left lower quadrant paracentesis--2.1 L of peritoneal fluid removed, per IR, Dr.Mugweru  Right IJ PowerPort placed per IR, Dr. Annamaria Boots 04/26/2021  CT abdomen and pelvis 04/29/2021      Subjective: C/o being tired   Objective: Vitals:   05/07/21 1440 05/08/21 0349 05/08/21 0500 05/08/21 0612  BP: 125/66 115/68  114/71  Pulse: 86 96  89  Resp: (!) '22 20  17  '$ Temp:  98.6 F (37 C)  98 F (36.7 C)  TempSrc:  Oral  Oral  SpO2: 97% 98%  97%  Weight:   82.1 kg   Height:        Intake/Output Summary (Last  24 hours) at 05/08/2021 1215 Last data filed at 05/08/2021 [id number] Gross per 24 hour  Intake 1280.38 ml  Output --  Net 1280.38 ml   Filed Weights   05/06/21 0500 05/07/21 0518 05/08/21 0500  Weight: 83.8 kg 85.7 kg 82.1 kg    Examination:   General: Appearance:     Overweight female in no acute distress     Lungs:     respirations unlabored  Heart:    Normal heart rate.    MS:   All extremities are intact.  On right forearm there is a large tender lump at site of prior IV   Neurologic:   Awake, alert, oriented x 3. No apparent focal neurological           defect.        Data Reviewed: I have personally reviewed following labs and imaging studies  CBC: Recent Labs  Lab 05/01/21 2050 05/05/21 0409 05/06/21 0549 05/07/21 0549 05/08/21 1041  WBC 13.0* 15.6* 11.7* 8.0 7.7  NEUTROABS  --   --   --  5.8  --   HGB 10.8* 9.6* 9.5* 9.0* 8.7*  HCT 33.6* 29.5* 29.2* 27.9* 27.3*  MCV 93.9 93.1 93.0 93.3 94.5  PLT 300 347 386 354 [id number]    Basic Metabolic Panel: Recent Labs  Lab 05/02/21 0536 05/03/21 0524 05/04/21 0543 05/05/21 0409 05/06/21 0549 05/07/21 0549  NA 138 136 134* 136 134* 135  K 4.0 4.2 4.6 4.7 4.3 4.0  CL 104 102 101 105 102 104  CO2 '26 24 23 22 24 25  '$ GLUCOSE 115* 110* 118* 169* 123* 131*  BUN '8 12 17 19 22 '$ 24*  CREATININE 0.85 0.88 0.75 0.69 0.88 0.76  CALCIUM 8.4* 8.4* 8.9 9.0 8.4* 8.5*  MG 1.7 1.8 2.1 1.8 1.8 1.8  PHOS 3.3 3.8  --   --  4.5 3.5    GFR: Estimated Creatinine Clearance: 75.7 mL/min (by C-G formula based on SCr of 0.76 mg/dL).  Liver Function Tests: Recent Labs  Lab 05/03/21 0524 05/07/21 0549  AST 13* 18  ALT 7 11  ALKPHOS 50 56  BILITOT 0.6 0.4  PROT 6.1* 6.2*  ALBUMIN 2.5* 2.4*    CBG: Recent Labs  Lab 05/05/21 2354 05/06/21 0744 05/06/21 1655 05/07/21 0003 05/07/21 0729  GLUCAP 123* 128* 125* 126* 133*     No results found for this or any previous visit (from the past 240 hour(s)).         Radiology  Studies: No results found.      Scheduled Meds:  Chlorhexidine Gluconate Cloth  6 each Topical Daily   insulin aspart  0-9 Units Subcutaneous 4 times per day   pantoprazole (PROTONIX) IV  40 mg Intravenous Q12H   sodium chloride flush  10-40 mL Intracatheter Q12H   sodium chloride flush  10-40 mL Intracatheter Q12H   Continuous Infusions:  heparin 1,550 Units/hr (05/08/21 0625)   lactated ringers 10 mL/hr at  05/08/21 0300   TPN ADULT (ION) 80 mL/hr at Q000111Q [id number]   TPN CYCLIC-ADULT (ION)       LOS: 17 days    Time spent: 25 minutes    Geradine Girt, DO [facility]   To contact the attending provider between 7A-7P or the covering provider during after hours 7P-7A, please log into the web site [email or web address] and access using universal [place] password for that web site. If you do not have the password, please call the hospital operator.  05/08/2021, 12:15 PM

## 2021-05-08 NOTE — Progress Notes (Signed)
ANTICOAGULATION CONSULT NOTE  Pharmacy Consult for Heparin Indication: pulmonary embolus  No Known Allergies  Patient Measurements: Height: '5\' 8"'$  (172.7 cm) Weight: 85.7 kg (188 lb 15 oz) IBW/kg (Calculated) : 63.9 Heparin Dosing Weight: 81 kg  Vital Signs: BP: 125/66 (08/15 1440) Pulse Rate: 86 (08/15 1440)  Labs: Recent Labs    05/05/21 0409 05/06/21 0549 05/07/21 0549 05/08/21 0130  HGB 9.6* 9.5* 9.0*  --   HCT 29.5* 29.2* 27.9*  --   PLT 347 386 354  --   HEPARINUNFRC 0.39 0.40 0.21* <0.10*  CREATININE 0.69 0.88 0.76  --     Estimated Creatinine Clearance: 77.1 mL/min (by C-G formula based on SCr of 0.76 mg/dL).  Medical History: Past Medical History:  Diagnosis Date   Acid reflux    Hypertension    Medications:  Scheduled:   Chlorhexidine Gluconate Cloth  6 each Topical Daily   pantoprazole (PROTONIX) IV  40 mg Intravenous Q12H   sodium chloride flush  10-40 mL Intracatheter Q12H   sodium chloride flush  10-40 mL Intracatheter Q12H   Infusions:   heparin 1,350 Units/hr (05/08/21 0029)   lactated ringers 1,000 mL (05/07/21 1856)   TPN ADULT (ION) 80 mL/hr at 05/08/21 0154   Assessment: 89 yoF with peritoneal carcinomatosis - now identified as Signet ring Ca, Chemo Folfox to begin 8/12.  NPO since admit for SBO, Diagnostic Lap 8/8, TPN started 8/8.  8/12 CT w/contrast for staging > PE. Pharmacy consulted to dose/monitor heparin drip  Today, 05/08/21 HL < 0.1 subtherapeutic on heparin infusion at 1350 units/hr after being resumed following EGD on 8/15 Confirmed with RN that heparin infusing at correct rate. No interruptions. No signs of bleeding.    Goal of Therapy:  Heparin level 0.3-0.7 units/ml Monitor platelets by anticoagulation protocol: Yes   Plan:  Increase heparin infusion to 1550 units/hr Check 8 hour HL CBC, HL daily while on heparin infusion Monitor for signs of bleeding   Everette Rank PharmD 05/08/2021,2:27 AM

## 2021-05-08 NOTE — Progress Notes (Signed)
PHARMACY - TOTAL PARENTERAL NUTRITION CONSULT NOTE   Indication: Gyn malignancy, subacute bowel obstruction, NPO  Patient Measurements: Height: '5\' 8"'$  (172.7 cm) Weight: 82.1 kg (181 lb) IBW/kg (Calculated) : 63.9 TPN AdjBW (KG): 77 Body mass index is 27.52 kg/m. TBW 77 kg  Assessment: 59 yoF with peritoneal carcinomatosis secondary to signet ring cell carcinoma with malignant ascites and bowel obstruction. Pharmacy consulted to manage TPN on 8/8.  Glucose / Insulin: No hx DM. A1c 6.4%. Goal CBG <180.  - CBG checks and SSI discontinued 8/15 as BG reading stable at goal rate TPN - Dexamethasone '8mg'$  x1 on 8/8, 10 mg IV x1 on 8/12 Electrolytes: All lytes WNL including CorrCa (9.8) - last labs 8/15 Renal: SCr and BUN - WNL, stable Hepatic: LFTs WNL, TG 44 (8/15), PreAlb 10.8 low but increasing (8/15) Intake / Output; MIVF: Strict I/O not measured - UOP: unmeasured x1, no stool output charted/24 hrs -MIVF stopped on 8/13 GI Imaging: -8/7 Abd/Pelvic CT with contrast: peritoneal carcinomatosis, moderate ascites, diffuse omental caking/cystic areas - none amenable to percutaneous biopsy. GI Surgeries / Procedures: Diagnostic Lap with biopsy 8/8 Onc note: likely GI primary - signet ring Ca  Central access: Has PAC placed 8/4, PICC 8/12 TPN start date: 8/8  Nutritional Goals (per RD recommendation on 8/5) kCal: 2100-2300, Protein: 100-115gm , Fluid: > 2L per day   Goal TPN rate 80 mL/hr (provides 106 g of protein and 2108 kcals per day)  Current Nutrition:  TPN, CLD -Patient tolerating some clear liquids. Plan is to discharge on TPN  Plan:   At 1800: Begin cycling TPN Cycle TPN over 18 hours  Will still provide 106 g protein, 2108 kcals, and 326 g dextrose Electrolytes in TPN: No changes Na 100 mEq/L K 20 mEq/L Ca 0 mEq/L Mg 53mq/L Phos 8 mmol/L Cl:Ac 1:1 Add standard MVI and trace elements to TPN Add folic acid to TPN (ok with Oncology) Order custom CBG checks + SSI at the  following times beginning at 20:00 tonight 2 hours after TPN starts (@ 20:00) 1 hour after TPN stops (@ 13:00) Middle of TPN infusion (@ 8:00 AM) Off of TPN (@ 16:00) No MIVF; management per MD. Monitor TPN labs on Mon/Thurs. Recheck electrolytes with AM labs tomorrow.  Patient to discharge on TPN, home health aware.  MLenis NoonPharmD 05/08/2021,10:55 AM

## 2021-05-08 NOTE — Progress Notes (Signed)
[place] for Heparin Indication: pulmonary embolus  No Known Allergies  Patient Measurements: Height: '5\' 8"'$  (172.7 cm) Weight: 82.1 kg (181 lb) IBW/kg (Calculated) : 63.9 Heparin Dosing Weight: 81 kg  Vital Signs: Temp: 98.7 F (37.1 C) (08/16 1330) Temp Source: Oral (08/16 1330) BP: 124/71 (08/16 1330) Pulse Rate: 89 (08/16 1330)  Labs: Recent Labs    05/06/21 0549 05/07/21 0549 05/08/21 0130 05/08/21 1041 05/08/21 1700  HGB 9.5* 9.0*  --  8.7*  --   HCT 29.2* 27.9*  --  27.3*  --   PLT 386 354  --  339  --   HEPARINUNFRC 0.40 0.21* <0.10* 0.42 0.60  CREATININE 0.88 0.76  --   --   --     Estimated Creatinine Clearance: 75.7 mL/min (by C-G formula based on SCr of 0.76 mg/dL).  Medical History: Past Medical History:  Diagnosis Date   Acid reflux    Hypertension    Medications:  Scheduled:   Chlorhexidine Gluconate Cloth  6 each Topical Daily   insulin aspart  0-9 Units Subcutaneous 4 times per day   pantoprazole (PROTONIX) IV  40 mg Intravenous Q12H   sodium chloride flush  10-40 mL Intracatheter Q12H   sodium chloride flush  10-40 mL Intracatheter Q12H   Infusions:   heparin 1,550 Units/hr (05/08/21 1704)   lactated ringers 10 mL/hr at Q000111Q [id number]   TPN CYCLIC-ADULT (ION) 123456 mL/hr at 05/08/21 1858   Assessment: 24 yoF with peritoneal carcinomatosis - now identified as Signet ring Ca, Chemo Folfox to begin 8/12.  NPO since admit for SBO, Diagnostic Lap 8/8, TPN started 8/8.  8/12 CT w/contrast for staging > PE. Pharmacy consulted to dose/monitor heparin drip  Today, 05/08/21 Confirmatory heparin level therapeutic on 1550 units/hr CBC: Hgb (8.7) low but relatively stable; Plt WNL & stable Confirmed with RN that heparin infusing at correct rate. No interruptions. No signs of bleeding.   Goal of Therapy:  Heparin level 0.3-0.7 units/ml Monitor platelets by anticoagulation protocol: Yes   Plan:  Continue heparin  infusion at current rate of 1550 units/hr CBC, HL daily while on heparin infusion Monitor for signs of bleeding Plan for transition to Eliquis tomorrow - Pharmacy to provide Eliquis education and coupons prior to discharge  Irbin Fines, [place] PharmD 05/08/2021,8:38 PM

## 2021-05-08 NOTE — TOC Benefit Eligibility Note (Signed)
Transition of Care [facility]) Benefit Eligibility Note    Patient Details  Name: Brenda Stewart MRN: [id number] Date of Birth: 1950/12/19   Medication/Dose: Xarelto 5 mg 2 x day and Xarelto 20 mg 1 x day both for 30 day supply at local pharmacy  Covered?: Yes  Tier: Other  Prescription Coverage Preferred Pharmacy: local or mail order  Spoke with Person/Company/Phone Number:: [employer] [phone]  Co-Pay: Eliquis and Xarelto are both $38.00 at local Pharmacy and $34.00 for 90 day supply mail order  Prior Approval: No  Deductible: Met       [name] Phone Number: 05/08/2021, 11:11 AM

## 2021-05-09 LAB — CBC
HCT: 25.3 % — ABNORMAL LOW (ref 36.0–46.0)
Hemoglobin: 8 g/dL — ABNORMAL LOW (ref 12.0–15.0)
MCH: 34 pg (ref 26.0–34.0)
MCHC: 31.6 g/dL (ref 30.0–36.0)
MCV: 107.7 fL — ABNORMAL HIGH (ref 80.0–100.0)
Platelets: 288 10*3/uL (ref 150–400)
RBC: 2.35 MIL/uL — ABNORMAL LOW (ref 3.87–5.11)
RDW: 14.6 % (ref 11.5–15.5)
WBC: 6.6 10*3/uL (ref 4.0–10.5)
nRBC: 0.3 % — ABNORMAL HIGH (ref 0.0–0.2)

## 2021-05-09 LAB — BASIC METABOLIC PANEL
Anion gap: 7 (ref 5–15)
BUN: 24 mg/dL — ABNORMAL HIGH (ref 8–23)
CO2: 23 mmol/L (ref 22–32)
Calcium: 8.3 mg/dL — ABNORMAL LOW (ref 8.9–10.3)
Chloride: 106 mmol/L (ref 98–111)
Creatinine, Ser: 0.71 mg/dL (ref 0.44–1.00)
GFR, Estimated: >60 mL/min (ref >60)
Glucose, Bld: 125 mg/dL — ABNORMAL HIGH (ref 70–99)
Potassium: 4 mmol/L (ref 3.5–5.1)
Sodium: 136 mmol/L (ref 135–145)

## 2021-05-09 LAB — GLUCOSE, CAPILLARY
Glucose-Capillary: 132 mg/dL — ABNORMAL HIGH (ref 70–99)
Glucose-Capillary: 133 mg/dL — ABNORMAL HIGH (ref 70–99)

## 2021-05-09 LAB — HEPARIN LEVEL (UNFRACTIONATED): Heparin Unfractionated: 0.39 IU/mL (ref 0.30–0.70)

## 2021-05-09 MED ORDER — APIXABAN 5 MG PO TABS: 5.0000 mg | ORAL_TABLET | Freq: Two times a day (BID) | ORAL | Status: DC

## 2021-05-09 MED ORDER — FOLIC ACID 1 MG PO TABS: 1.0000 mg | ORAL_TABLET | Freq: Every day | ORAL | 3 refills | Status: AC

## 2021-05-09 MED ORDER — HEPARIN SOD (PORK) LOCK FLUSH 100 UNIT/ML IV SOLN
500.0000 [IU] | INTRAVENOUS | Status: AC | PRN
Start: 2021-05-09 — End: 2021-05-09
  Administered 2021-05-09: 500 [IU]
  Filled 2021-05-09: qty 5

## 2021-05-09 MED ORDER — HEPARIN SOD (PORK) LOCK FLUSH 100 UNIT/ML IV SOLN
250.0000 [IU] | INTRAVENOUS | Status: AC | PRN
  Administered 2021-05-09: 250 [IU]
  Filled 2021-05-09: qty 5

## 2021-05-09 MED ORDER — APIXABAN 5 MG PO TABS
10.0000 mg | ORAL_TABLET | Freq: Two times a day (BID) | ORAL | Status: DC
  Administered 2021-05-09: 10 mg via ORAL
  Filled 2021-05-09: qty 2

## 2021-05-09 MED ORDER — APIXABAN (ELIQUIS) VTE STARTER PACK (10MG AND 5MG): ORAL_TABLET | ORAL | 0 refills | Status: DC

## 2021-05-09 MED ORDER — PANTOPRAZOLE SODIUM 40 MG PO TBEC
40.0000 mg | DELAYED_RELEASE_TABLET | Freq: Two times a day (BID) | ORAL | Status: DC
  Administered 2021-05-09: 40 mg via ORAL
  Filled 2021-05-09: qty 1

## 2021-05-09 NOTE — Discharge Summary (Addendum)
Physician Discharge Summary  Brenda Stewart [id number] DOB: 26-Jul-1951 DOA: 04/21/2021  PCP: Pcp, No  Admit date: 04/21/2021 Discharge date: 05/09/2021 30 Day Unplanned Readmission Risk Score    Flowsheet Row ED to Hosp-Admission (Current) from 04/21/2021 in [place] [facility]  30 Day Unplanned Readmission Risk Score (%) 31.83 Filed at 05/09/2021 0801       This score is the patient's risk of an unplanned readmission within 30 days of being discharged (0 -100%). The score is based on dignosis, age, lab data, medications, orders, and past utilization.   Low:  0-14.9   Medium: 15-21.9   High: 22-29.9   Extreme: 30 and above          Admitted From: Home Disposition: Home  Recommendations for Outpatient Follow-up:  Follow up with PCP in 1-2 weeks Please obtain BMP/CBC in one week Follow-up with oncology per schedule Please follow up with your PCP on the following pending results: Unresulted Labs (From admission, onward)     Start     Ordered   05/07/21 0500  CBC  (TPN Lab Panel)  Every Monday (0500),   R     Question:  Specimen collection method  Answer:  Lab=Lab collect   04/30/21 1039   05/07/21 0500  Differential  (TPN Lab Panel)  Every Monday (0500),   R     Question:  Specimen collection method  Answer:  Lab=Lab collect   04/30/21 1039   05/07/21 0500  Triglycerides  (TPN Lab Panel)  Every Monday (0500),   R     Question:  Specimen collection method  Answer:  Lab=Lab collect   04/30/21 1039   05/07/21 0500  Prealbumin  (TPN Lab Panel)  Every Monday (0500),   R     Question:  Specimen collection method  Answer:  Lab=Lab collect   04/30/21 1039   05/03/21 0500  Comprehensive metabolic panel  (TPN Lab Panel)  Every Mon,Thu (0500),   R     Question:  Specimen collection method  Answer:  Lab=Lab collect   04/30/21 1039   05/03/21 0500  Magnesium  (TPN Lab Panel)  Every Mon,Thu (0500),   R     Question:  Specimen collection method  Answer:  Lab=Lab collect    04/30/21 1039   05/03/21 0500  Phosphorus  (TPN Lab Panel)  Every Mon,Thu (0500),   R     Question:  Specimen collection method  Answer:  Lab=Lab collect   04/30/21 1039   Unscheduled  CBC with Differential ([place] Only)  STAT      05/04/21 0937   Unscheduled  CMP ([place] only)  STAT      05/04/21 [place]: Yes Equipment/Devices: IV infusion  Discharge Condition: Stable CODE STATUS: Full code Diet recommendation: liquid diet  Subjective: Patient seen and examined.  She has no complaints.  She is pleasant.  She is ready to be discharged.  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  Biopsy was done on 04/30/2021.  TPN was also started on the same day.  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  Suspected GI source so she is s/p EGD which showed white plaque like lesions in the distal 1/3 esophageal mucosa-cells for cytology obtained.  Also underwent paracentesis and  cytology was unrevealing.  Status post port placement on 04/26/2021.  Incidentally found PE on CT scan, started on heparin and then transition to Eliquis.  Electrolytes were replenished.  Had iron deficiency anemia and folate deficiency.  Did not require any blood transfusion.  Also had some lump on the right forearm for which ultrasound was done which showed superficial thrombophlebitis.  Patient advised to keep it elevated with cold compresses.  Oncology has cleared the patient for discharge, they will follow-up with outpatient however they have recommended that patient should only be on clear liquid diet and for severe protein calorie malnutrition which is secondary to malignancy, she will also go home on TPN and we anticipate long-term TPN.  She also had AKI which is resolved after hydration.  Hemoglobin 8.0 today.  She is being discharged in stable  condition.  Discharge Diagnoses:  Principal Problem:   SBO (small bowel obstruction) (HCC) Active Problems:   AKI (acute kidney injury) ([place])   Prolonged QT interval   Unintended weight loss   Nausea & vomiting   Hyponatremia   Neoplasm of peritoneum   Leukocytosis   Ascites   Thrombocytosis   Protein-calorie malnutrition, severe   Peritoneal carcinomatosis ([place])   Hypokalemia   Carcinomatosis ([place])    Discharge Instructions  Discharge Instructions     TREATMENT CONDITIONS   Complete by: As directed    Patient should have CBC & CMP within 7 days prior to chemotherapy administration. NOTIFY MD IF: ANC < 1500, Hemoglobin < 8, PLT < 100,000,  Total Bili > 1.5, Creatinine > 1.5, ALT & AST > 80 or if patient has unstable vital signs: Temperature > 38.5, SBP > 180 or < 90, RR > 30 or HR > 100.      Allergies as of 05/09/2021   No Known Allergies      Medication List     STOP taking these medications    amLODipine 10 MG tablet Commonly known as: NORVASC   lisinopril 40 MG tablet Commonly known as: ZESTRIL       TAKE these medications    Apixaban Starter Pack ('10mg'$  and '5mg'$ ) Commonly known as: ELIQUIS STARTER PACK Take as directed on package: start with two-'5mg'$  tablets twice daily for 7 days. On day 8, switch to one-'5mg'$  tablet twice daily.   ascorbic acid 250 MG Chew Commonly known as: VITAMIN C Chew 500 mg by mouth daily.   aspirin EC 81 MG tablet Take 81 mg by mouth daily. Swallow whole.   omeprazole 20 MG capsule Commonly known as: PRILOSEC Take 20 mg by mouth daily as needed (heartburn/acid reflux).   sennosides-docusate sodium 8.6-50 MG tablet Commonly known as: SENOKOT-S Take 2 tablets by mouth daily as needed for constipation.   Vitamin D3 50 MCG (2000 UT) Chew Chew 2 tablets by mouth daily.        No Known Allergies  Consultations: Oncology, GI and general surgery   Procedures/Studies: CT ABDOMEN PELVIS WO CONTRAST  Result Date:  04/21/2021 CLINICAL DATA:  Nausea and vomiting for 2 days. Lower abdominal pain for approximately 3 months. Chronic constipation. EXAM: CT ABDOMEN AND PELVIS WITHOUT CONTRAST TECHNIQUE: Multidetector CT imaging of the abdomen and pelvis was performed following the standard protocol without IV contrast. COMPARISON:  None. FINDINGS: Lower chest: No acute findings. Hepatobiliary: No mass visualized on this unenhanced exam. Gallbladder is unremarkable. No evidence of biliary ductal dilatation. Pancreas: No mass or inflammatory process visualized on this unenhanced exam. Spleen:  Within normal limits  in size. Adrenals/Urinary tract: No evidence of urolithiasis or hydronephrosis. Unremarkable unopacified urinary bladder. Stomach/Bowel: Small hiatal hernia is seen containing ascites. Moderate diffuse small bowel dilatation is seen, with suspected transition point in the right lower quadrant, suspicious for distal small bowel obstruction. Vascular/Lymphatic: No pathologically enlarged lymph nodes identified. No evidence of abdominal aortic aneurysm. Aortic atherosclerotic calcification noted. Reproductive: Prior hysterectomy suspected. Moderate ascites is seen. Omental caking and diffuse mesenteric soft tissue stranding is seen. There is also probable peritoneal nodularity along the left pelvic sidewall. These findings are highly suspicious for peritoneal carcinomatosis. Other:  None. Musculoskeletal:  No suspicious bone lesions identified. 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 Suspected distal small bowel obstruction, with probable transition point in right lower quadrant. Aortic Atherosclerosis (ICD10-I70.0). Electronically Signed   By: Marlaine Hind M.D.   On: 04/21/2021 18:36   CT CHEST W CONTRAST  Result Date: 05/04/2021 CLINICAL DATA:  GI stromal cancer staging. Peritoneal carcinomatosis. Right Port-A-Cath tip: SVC. EXAM: CT CHEST WITH CONTRAST TECHNIQUE: Multidetector CT  imaging of the chest was performed during intravenous contrast administration. CONTRAST:  86m OMNIPAQUE IOHEXOL 350 MG/ML SOLN COMPARISON:  CT abdomen 04/29/2021 FINDINGS: Cardiovascular: Today's exam is not optimized to assess for pulmonary embolus, but there is suspicion for mild segmental filling defect in the anterior segment right upper lobe and apical segment right upper lobe concerning for pulmonary embolus. Clot burden is small. Right ventricular to left ventricular ratio 1.1. Mediastinum/Nodes: Multiple hypodense thyroid nodules measure up to 2.1 cm in diameter. Recommend thyroid [place](ref: J Am Coll Radiol. 2015 Feb;12(2): 143-50).Distal paraesophageal lymph node 0.8 cm in short axis on image 113 series 2. No overtly pathologic thoracic adenopathy is identified. Lungs/Pleura: Small left pleural effusion (nonspecific for transudative or exudative etiology) with passive atelectasis in the left lower lobe. Subsegmental atelectasis or scarring along the right hemidiaphragm. No worrisome pulmonary nodules. Upper Abdomen: Left upper quadrant ascites along the lesser sac and below the diaphragm with some infiltration of the adjacent omentum. Small focal hypodensities posteriorly along the right hepatic lobe as on recent CT abdomen. Periportal edema. These findings are similar to recent CT abdomen although the amount of left upper quadrant ascites is reduced. There is gas density in the subcutaneous tissues of the left upper abdomen and lower chest. Musculoskeletal: Degenerative glenohumeral arthropathy, right greater than left. Lower cervical and thoracic spondylosis. IMPRESSION: 1. Suspected segmental filling defects in the anterior segment right upper lobe and apical segment right upper lobe compatible with small pulmonary emboli. Clot burden appears small, although please note that today's examination was not a dedicated CT angiogram and as such might overestimate or underestimate pulmonary embolus. Positive  for acute PE with CT evidence of right heart strain (RV/LV Ratio = 1.1) consistent with at least submassive (intermediate risk) PE. The presence of right heart strain has been associated with an increased risk of morbidity and mortality. Please refer to the "PE Focused" order set in EPIC. 2. No compelling findings of metastatic disease to the chest. There is a small left pleural effusion, but no definite enhancement along the pleura to suggest malignant or exudative etiology at this time. 3. There is some new gas in the soft tissues of the left thoracoabdominal wall, compatible subcutaneous emphysema, probably related to recent laparoscopy or other procedure. 4. Hypodense thyroid nodules up to 2.1 cm in diameter. Recommend thyroid [place](ref: J Am Coll Radiol. 2015 Feb;12(2): 143-50). 5. Left upper quadrant ascites, including fluid in the lesser sac. This  is reduced compared to the prior CT abdomen of 04/29/2021. 6. Stable appearance of periportal edema and hypodensities along the posterior margin of the right hepatic lobe. Critical Value/emergent results were called by telephone at the time of interpretation on 05/04/2021 at 12:53 pm to provider [facility] , who verbally acknowledged these results. Electronically Signed   By: Van Clines M.D.   On: 05/04/2021 12:53   CT ABDOMEN PELVIS W CONTRAST  Result Date: 04/29/2021 CLINICAL DATA:  70 year old female for staging of peritoneal carcinomatosis prior to chemotherapy. EXAM: CT ABDOMEN AND PELVIS WITH CONTRAST TECHNIQUE: Multidetector CT imaging of the abdomen and pelvis was performed using the standard protocol following bolus administration of intravenous contrast. CONTRAST:  47m OMNIPAQUE IOHEXOL 350 MG/ML SOLN COMPARISON:  04/21/2021 noncontrast study. FINDINGS: Lower chest: A small LEFT pleural effusion with LEFT LOWER lung atelectasis is now noted. A trace RIGHT pleural effusion with mild RIGHT basilar atelectasis is identified. Hepatobiliary: Small  hypodense lesions along the INFERIOR RIGHT liver are compatible with subserosal implants. No other hepatic abnormalities are present. The gallbladder appears mildly thick walled. No biliary dilatation. Pancreas: Unremarkable Spleen: Unremarkable Adrenals/Urinary Tract: The kidneys, adrenal glands and bladder are unremarkable. Stomach/Bowel: No dilated bowel loops are noted. No definite focal bowel wall thickening is identified. Vascular/Lymphatic: Aortic atherosclerosis. No enlarged abdominal or pelvic lymph nodes. Reproductive: Patient is status post hysterectomy. No definite adnexal masses are noted. Other: Moderate ascites within the anterior/mid abdomen extending into the pelvis are noted. There is mild peritoneal thickening/minimal peritoneal irregularity noted in several areas. There are smaller focal fluid/cystic areas within the RIGHT abdomen and LOWER pelvis. Diffuse omental thickening/caking is noted. Mild stranding within the mesentery is also present. No evidence of pneumoperitoneum. Musculoskeletal: No acute or suspicious bony abnormalities are identified. IMPRESSION: 1. Moderate ascites, mild peritoneal thickening and diffuse omental thickening/caking and smaller focal fluid/cystic areas within the RIGHT abdomen and LOWER pelvis, compatible with peritoneal carcinomatosis. Mild mesenteric stranding is nonspecific. 2. Small subserosal implants along the INFERIOR RIGHT liver. 3. Small LEFT pleural effusion and trace RIGHT pleural effusion with LEFT LOWER lung atelectasis. 4. Aortic Atherosclerosis (ICD10-I70.0). Electronically Signed   By: JMargarette CanadaM.D.   On: 04/29/2021 12:16   [place]domen Limited  Result Date: 04/22/2021 CLINICAL DATA:  Peritoneal neoplasm abdominal distension, recurrent ascites EXAM: LIMITED ABDOMEN ULTRASOUND FOR ASCITES TECHNIQUE: Limited ultrasound survey for ascites was performed in all four abdominal quadrants. COMPARISON:  04/21/2021 CT FINDINGS: Survey of the abdominal 4  quadrants demonstrates a small volume of lower abdominal ascites by ultrasound. Unable to perform paracentesis because of intractable nausea vomiting today. IMPRESSION: Small volume of lower abdominopelvic ascites. Will reassess for paracentesis tomorrow 04/23/2021. Electronically Signed   By: MJerilynn Mages  Shick M.D.   On: 04/22/2021 12:09   DG Abd 2 Views  Result Date: 04/28/2021 CLINICAL DATA:  Small bowel obstruction. EXAM: ABDOMEN - 2 VIEW COMPARISON:  04/26/2021 and earlier FINDINGS: There is focal atelectasis or. No free intraperitoneal consolidation MEDIAL LEFT LOWER lobe air. Persistent mild dilatation of small bowel loops in the LEFT central abdomen, similar in appearance to previous CT exam. Air is identified within nondilated loops of colon. There are degenerative changes in both hips. IMPRESSION: 1. Persistent mild dilatation of small bowel loops in the LEFT central abdomen. 2. LEFT LOWER lobe atelectasis or consolidation. Electronically Signed   By: ENolon NationsM.D.   On: 04/28/2021 10:13   DG Abd 2 Views  Result Date: 04/26/2021 CLINICAL DATA:  Follow-up small  bowel obstruction. EXAM: ABDOMEN - 2 VIEW COMPARISON:  A2 22 FINDINGS: There is mild residual gaseous distension of multiple small bowel loops, decreased from prior. A small amount of gas is scattered in the colon and rectum. No intraperitoneal free air is identified. Atelectasis is noted in the left lung base. IMPRESSION: Decreased small bowel distension compatible with improving obstruction. Electronically Signed   By: Logan Bores M.D.   On: 04/26/2021 12:45   DG Abd Portable 1V  Result Date: 04/24/2021 CLINICAL DATA:  Small-bowel obstruction. EXAM: PORTABLE ABDOMEN - 1 VIEW COMPARISON:  04/22/2021.  CT 04/21/2021. FINDINGS: Mild-to-moderately distended loops of small bowel with a relative paucity of colonic gas again noted. Findings again consistent with small bowel obstruction. No free air. Ascites most likely present. Pelvic  calcifications consistent phleboliths. Degenerative changes lumbar spine and both hips. IMPRESSION: Mild-to-moderate small-bowel distention with a paucity of intra colonic air again noted. Findings again consistent with small bowel obstruction. Ascites most likely present. Electronically Signed   By: Marcello Moores  Register   On: 04/24/2021 11:08   DG Abd Portable 1V-Small Bowel Obstruction Protocol-initial, 8 hr delay  Result Date: 04/22/2021 CLINICAL DATA:  Small bowel obstruction, 8 hour film. EXAM: PORTABLE ABDOMEN - 1 VIEW COMPARISON:  CT yesterday. FINDINGS: Administered enteric contrast has reached the ascending and proximal transverse colon. There is enteric contrast within prominent small bowel in the central abdomen. Left upper quadrant contrast may be residual within small bowel or at the splenic flexure. No evidence of free air. IMPRESSION: Administered enteric contrast has reached the ascending and proximal transverse colon. There is enteric contrast within prominent small bowel in the central abdomen. Left upper quadrant contrast may be residual within small bowel or at the splenic flexure. Electronically Signed   By: Keith Rake M.D.   On: 04/22/2021 17:38   ECHOCARDIOGRAM COMPLETE  Result Date: 05/05/2021    ECHOCARDIOGRAM REPORT   Patient Name:   Brenda Stewart [facility] Date of Exam: 05/05/2021 Medical Rec #:  [id number]      Height:       68.0 in Accession #:    [id number]     Weight:       189.4 lb Date of Birth:  November 29, 1950     BSA:          1.997 m Patient Age:    59 years       BP:           129/80 mmHg Patient Gender: F              HR:           90 bpm. Exam Location:  Inpatient Procedure: 2D Echo, Cardiac Doppler and Color Doppler Indications:    Pulmonary Embolus I26.09  History:        Patient has no prior history of Echocardiogram examinations.                 Risk Factors:Hypertension.  Sonographer:    Darlina Sicilian RDCS Referring Phys: [place]  1. Left ventricular  ejection fraction, by estimation, is 60 to 65%. The left ventricle has normal function. The left ventricle has no regional wall motion abnormalities. There is mild left ventricular hypertrophy of the basal-septal segment. Left ventricular diastolic parameters are consistent with Grade I diastolic dysfunction (impaired relaxation).  2. Right ventricular systolic function is normal. The right ventricular size is normal.  3. Moderate pleural effusion in the left lateral region.  4. The  mitral valve is normal in structure. Trivial mitral valve regurgitation. No evidence of mitral stenosis.  5. The aortic valve is tricuspid. Aortic valve regurgitation is not visualized. Mild aortic valve sclerosis is present, with no evidence of aortic valve stenosis.  6. The inferior vena cava is normal in size with greater than 50% respiratory variability, suggesting right atrial pressure of 3 mmHg. FINDINGS  Left Ventricle: Left ventricular ejection fraction, by estimation, is 60 to 65%. The left ventricle has normal function. The left ventricle has no regional wall motion abnormalities. The left ventricular internal cavity size was normal in size. There is  mild left ventricular hypertrophy of the basal-septal segment. Left ventricular diastolic parameters are consistent with Grade I diastolic dysfunction (impaired relaxation). Right Ventricle: The right ventricular size is normal. Right ventricular systolic function is normal. Left Atrium: Left atrial size was normal in size. Right Atrium: Right atrial size was normal in size. Pericardium: There is no evidence of pericardial effusion. Mitral Valve: The mitral valve is normal in structure. Trivial mitral valve regurgitation. No evidence of mitral valve stenosis. Tricuspid Valve: The tricuspid valve is normal in structure. Tricuspid valve regurgitation is trivial. No evidence of tricuspid stenosis. Aortic Valve: The aortic valve is tricuspid. Aortic valve regurgitation is not  visualized. Mild aortic valve sclerosis is present, with no evidence of aortic valve stenosis. Pulmonic Valve: The pulmonic valve was normal in structure. Pulmonic valve regurgitation is trivial. No evidence of pulmonic stenosis. Aorta: The aortic root is normal in size and structure. Venous: The inferior vena cava is normal in size with greater than 50% respiratory variability, suggesting right atrial pressure of 3 mmHg. IAS/Shunts: No atrial level shunt detected by color flow Doppler. Additional Comments: There is a moderate pleural effusion in the left lateral region.  LEFT VENTRICLE PLAX 2D LVIDd:         3.90 cm  Diastology LVIDs:         2.90 cm  LV e' medial:    6.18 cm/s LV PW:         0.80 cm  LV E/e' medial:  8.6 LV IVS:        1.40 cm  LV e' lateral:   9.03 cm/s LVOT diam:     2.10 cm  LV E/e' lateral: 5.9 LV SV:         61 LV SV Index:   31 LVOT Area:     3.46 cm  RIGHT VENTRICLE RV S prime:     14.40 cm/s TAPSE (M-mode): 1.1 cm LEFT ATRIUM             Index       RIGHT ATRIUM           Index LA diam:        2.70 cm 1.35 cm/m  RA Area:     13.60 cm LA Vol (A2C):   25.1 ml 12.57 ml/m RA Volume:   28.20 ml  14.12 ml/m LA Vol (A4C):   16.0 ml 8.01 ml/m LA Biplane Vol: 20.0 ml 10.02 ml/m  AORTIC VALVE LVOT Vmax:   113.50 cm/s LVOT Vmean:  74.400 cm/s LVOT VTI:    0.177 m  AORTA Ao Root diam: 3.00 cm Ao Asc diam:  3.25 cm MITRAL VALVE MV Area (PHT): 3.78 cm    SHUNTS MV Decel Time: 201 msec    Systemic VTI:  0.18 m MV E velocity: 53.35 cm/s  Systemic Diam: 2.10 cm MV A velocity: 88.05 cm/s MV E/A  ratio:  0.61 Kirk Ruths MD Electronically signed by Kirk Ruths MD Signature Date/Time: 05/05/2021/10:24:12 AM    Final    [place] RT UPPER EXTREM LTD SOFT TISSUE NON VASCULAR  Result Date: 05/08/2021 CLINICAL DATA:  Enlarging lump at site of prior IV. EXAM: ULTRASOUND right UPPER EXTREMITY LIMITED TECHNIQUE: Ultrasound examination of the upper extremity soft tissues was performed in the area of clinical  concern. COMPARISON:  None. FINDINGS: There is an enlarged and noncompressible subcutaneous vein without demonstrable internal blood flow. Findings suggest superficial thrombophlebitis. IMPRESSION: The patient's palpable abnormality corresponds to superficial thrombophlebitis. Electronically Signed   By: Marijo Sanes M.D.   On: 05/08/2021 15:36   IR IMAGING GUIDED PORT INSERTION  Result Date: 04/26/2021 CLINICAL DATA:  Peritoneal carcinomatosis, unknown primary, access for chemotherapy EXAM: RIGHT INTERNAL JUGULAR SINGLE LUMEN POWER PORT CATHETER INSERTION Date:  04/26/2021 04/26/2021 2:15 pm Radiologist:  M. Daryll Brod, MD Guidance:  Ultrasound and fluoroscopic MEDICATIONS: 1% lidocaine with epinephrine local ANESTHESIA/SEDATION: Versed 2.0 mg IV; Fentanyl 100 mcg IV; Moderate Sedation Time:  25 minutes The patient was continuously monitored during the procedure by the interventional radiology nurse under my direct supervision. FLUOROSCOPY TIME:  One minutes, 0 seconds (5 mGy) COMPLICATIONS: None immediate. CONTRAST:  None. PROCEDURE: Informed consent was obtained from the patient following explanation of the procedure, risks, benefits and alternatives. The patient understands, agrees and consents for the procedure. All questions were addressed. A time out was performed. Maximal barrier sterile technique utilized including caps, mask, sterile gowns, sterile gloves, large sterile drape, hand hygiene, and 2% chlorhexidine scrub. Under sterile conditions and local anesthesia, right internal jugular micropuncture venous access was performed. Access was performed with ultrasound. Images were obtained for documentation of the patent right internal jugular vein. A guide wire was inserted followed by a transitional dilator. This allowed insertion of a guide wire and catheter into the IVC. Measurements were obtained from the SVC / RA junction back to the right IJ venotomy site. In the right infraclavicular chest, a  subcutaneous pocket was created over the second anterior rib. This was done under sterile conditions and local anesthesia. 1% lidocaine with epinephrine was utilized for this. A 2.5 cm incision was made in the skin. Blunt dissection was performed to create a subcutaneous pocket over the right pectoralis major muscle. The pocket was flushed with saline vigorously. There was adequate hemostasis. The port catheter was assembled and checked for leakage. The port catheter was secured in the pocket with two retention sutures. The tubing was tunneled subcutaneously to the right venotomy site and inserted into the SVC/RA junction through a valved peel-away sheath. Position was confirmed with fluoroscopy. Images were obtained for documentation. The patient tolerated the procedure well. No immediate complications. Incisions were closed in a two layer fashion with 4 - 0 Vicryl suture. Dermabond was applied to the skin. The port catheter was accessed, blood was aspirated followed by saline and heparin flushes. Needle was removed. A dry sterile dressing was applied. IMPRESSION: Ultrasound and fluoroscopically guided right internal jugular single lumen power port catheter insertion. Tip in the SVC/RA junction. Catheter ready for use. Electronically Signed   By: Jerilynn Mages.  Shick M.D.   On: 04/26/2021 14:22   VAS [place] LOWER EXTREMITY VENOUS (DVT)  Result Date: 05/05/2021  Lower Venous DVT Study Patient Name:  Brenda Stewart [facility]  Date of Exam:   05/05/2021 Medical Rec #: [id number]       Accession #:    [id number] Date of Birth: [birth year]  Patient Gender: F Patient Age:   12 years Exam Location:  [facility] Procedure:      VAS [place] LOWER EXTREMITY VENOUS (DVT) Referring Phys: Eulogio Bear --------------------------------------------------------------------------------  Indications: Pulmonary embolism.  Comparison Study: no prior Performing Technologist: Archie Patten RVS  Examination Guidelines: A complete evaluation includes  B-mode imaging, spectral Doppler, color Doppler, and power Doppler as needed of all accessible portions of each vessel. Bilateral testing is considered an integral part of a complete examination. Limited examinations for reoccurring indications may be performed as noted. The reflux portion of the exam is performed with the patient in reverse Trendelenburg.  +---------+---------------+---------+-----------+----------+--------------+ RIGHT    CompressibilityPhasicitySpontaneityPropertiesThrombus Aging +---------+---------------+---------+-----------+----------+--------------+ CFV      Full           Yes      Yes                                 +---------+---------------+---------+-----------+----------+--------------+ SFJ      Full                                                        +---------+---------------+---------+-----------+----------+--------------+ FV Prox  Full                                                        +---------+---------------+---------+-----------+----------+--------------+ FV Mid   Full                                                        +---------+---------------+---------+-----------+----------+--------------+ FV DistalFull                                                        +---------+---------------+---------+-----------+----------+--------------+ PFV      Full                                                        +---------+---------------+---------+-----------+----------+--------------+ POP      Full           Yes      Yes                                 +---------+---------------+---------+-----------+----------+--------------+ PTV      Full                                                        +---------+---------------+---------+-----------+----------+--------------+ PERO  Full                                                         +---------+---------------+---------+-----------+----------+--------------+   +---------+---------------+---------+-----------+----------+--------------+ LEFT     CompressibilityPhasicitySpontaneityPropertiesThrombus Aging +---------+---------------+---------+-----------+----------+--------------+ CFV      Full           Yes      Yes                                 +---------+---------------+---------+-----------+----------+--------------+ SFJ      Full                                                        +---------+---------------+---------+-----------+----------+--------------+ FV Prox  Full                                                        +---------+---------------+---------+-----------+----------+--------------+ FV Mid   Full                                                        +---------+---------------+---------+-----------+----------+--------------+ FV DistalFull                                                        +---------+---------------+---------+-----------+----------+--------------+ PFV      Full                                                        +---------+---------------+---------+-----------+----------+--------------+ POP      Full           Yes      Yes                                 +---------+---------------+---------+-----------+----------+--------------+ PTV      Full                                                        +---------+---------------+---------+-----------+----------+--------------+ PERO     Full                                                        +---------+---------------+---------+-----------+----------+--------------+  Summary: BILATERAL: - No evidence of deep vein thrombosis seen in the lower extremities, bilaterally. -No evidence of popliteal cyst, bilaterally.   *See table(s) above for measurements and observations. Electronically signed by Harold Barban MD on 05/05/2021 at 1:26:43  PM.    Final    [place] EKG SITE RITE  Result Date: 05/04/2021 If Site Rite image not attached, placement could not be confirmed due to current cardiac rhythm.  IR Paracentesis  Result Date: 04/24/2021 INDICATION: Abdominal distention secondary to ascites. Underlying concern for peritoneal carcinomatosis on imaging findings. Request for diagnostic and therapeutic paracentesis. EXAM: ULTRASOUND GUIDED LEFT LOWER QUADRANT PARACENTESIS MEDICATIONS: 1% plain lidocaine, 5 mL COMPLICATIONS: None immediate. PROCEDURE: Informed written consent was obtained from the patient after a discussion of the risks, benefits and alternatives to treatment. A timeout was performed prior to the initiation of the procedure. Initial ultrasound scanning demonstrates a large amount of ascites within the left lower abdominal quadrant. The left lower abdomen was prepped and draped in the usual sterile fashion. 1% lidocaine was used for local anesthesia. Following this, a 19 gauge, 7-cm, Yueh catheter was introduced. An ultrasound image was saved for documentation purposes. The paracentesis was performed. The catheter was removed and a dressing was applied. The patient tolerated the procedure well without immediate post procedural complication. FINDINGS: A total of approximately 2.1 L of clear yellow fluid was removed. Samples were sent to the laboratory as requested by the clinical team. IMPRESSION: Successful ultrasound-guided paracentesis yielding 2.1 liters of peritoneal fluid. Read by: Ascencion Dike PA-C Electronically Signed   By: Michaelle Birks MD   On: 04/24/2021 12:38     Discharge Exam: Vitals:   05/08/21 2110 05/09/21 0508  BP: 123/72 130/72  Pulse: 98 95  Resp: 16 16  Temp: 97.6 F (36.4 C) 97.9 F (36.6 C)  SpO2: 97% 96%   Vitals:   05/08/21 0612 05/08/21 1330 05/08/21 2110 05/09/21 0508  BP: 114/71 124/71 123/72 130/72  Pulse: 89 89 98 95  Resp: '17 17 16 16  '$ Temp: 98 F (36.7 C) 98.7 F (37.1 C) 97.6 F (36.4  C) 97.9 F (36.6 C)  TempSrc: Oral Oral Oral Oral  SpO2: 97% 99% 97% 96%  Weight:    87.6 kg  Height:        General: Pt is alert, awake, not in acute distress Cardiovascular: RRR, S1/S2 +, no rubs, no gallops Respiratory: CTA bilaterally, no wheezing, no rhonchi Abdominal: Soft, NT, ND, bowel sounds + Extremities: no edema, no cyanosis    The results of significant diagnostics from this hospitalization (including imaging, microbiology, ancillary and laboratory) are listed below for reference.     Microbiology: No results found for this or any previous visit (from the past 240 hour(s)).   Labs: BNP (last 3 results) No results for input(s): BNP in the last 8760 hours. Basic Metabolic Panel: Recent Labs  Lab 05/03/21 0524 05/04/21 0543 05/05/21 0409 05/06/21 0549 05/07/21 0549 05/09/21 0811  NA 136 134* 136 134* 135 136  K 4.2 4.6 4.7 4.3 4.0 4.0  CL 102 101 105 102 104 106  CO2 '24 23 22 24 25 23  '$ GLUCOSE 110* 118* 169* 123* 131* 125*  BUN '12 17 19 22 '$ 24* 24*  CREATININE 0.88 0.75 0.69 0.88 0.76 0.71  CALCIUM 8.4* 8.9 9.0 8.4* 8.5* 8.3*  MG 1.8 2.1 1.8 1.8 1.8  --   PHOS 3.8  --   --  4.5 3.5  --    Liver Function  Tests: Recent Labs  Lab 05/03/21 0524 05/07/21 0549  AST 13* 18  ALT 7 11  ALKPHOS 50 56  BILITOT 0.6 0.4  PROT 6.1* 6.2*  ALBUMIN 2.5* 2.4*   No results for input(s): LIPASE, AMYLASE in the last 168 hours. No results for input(s): AMMONIA in the last 168 hours. CBC: Recent Labs  Lab 05/05/21 0409 05/06/21 0549 05/07/21 0549 05/08/21 1041 05/09/21 0536  WBC 15.6* 11.7* 8.0 7.7 6.6  NEUTROABS  --   --  5.8  --   --   HGB 9.6* 9.5* 9.0* 8.7* 8.0*  HCT 29.5* 29.2* 27.9* 27.3* 25.3*  MCV 93.1 93.0 93.3 94.5 107.7*  PLT 347 386 354 339 288   Cardiac Enzymes: No results for input(s): CKTOTAL, CKMB, CKMBINDEX, TROPONINI in the last 168 hours. BNP: Invalid input(s): POCBNP CBG: Recent Labs  Lab 05/07/21 0003 05/07/21 0729  05/08/21 2206 05/09/21 0505 05/09/21 0801  GLUCAP 126* 133* 123* 132* 133*   D-Dimer No results for input(s): DDIMER in the last 72 hours. Hgb A1c No results for input(s): HGBA1C in the last 72 hours. Lipid Profile Recent Labs    05/07/21 0549  TRIG 44   Thyroid function studies No results for input(s): TSH, T4TOTAL, T3FREE, THYROIDAB in the last 72 hours.  Invalid input(s): FREET3 Anemia work up No results for input(s): VITAMINB12, FOLATE, FERRITIN, TIBC, IRON, RETICCTPCT in the last 72 hours. Urinalysis    Component Value Date/Time   COLORURINE AMBER (A) 04/21/2021 1407   APPEARANCEUR HAZY (A) 04/21/2021 1407   LABSPEC 1.024 04/21/2021 1407   PHURINE 5.0 04/21/2021 1407   GLUCOSEU NEGATIVE 04/21/2021 1407   HGBUR NEGATIVE 04/21/2021 1407   BILIRUBINUR SMALL (A) 04/21/2021 1407   KETONESUR 5 (A) 04/21/2021 1407   PROTEINUR 30 (A) 04/21/2021 1407   UROBILINOGEN 1.0 02/27/2021 1607   NITRITE NEGATIVE 04/21/2021 1407   LEUKOCYTESUR NEGATIVE 04/21/2021 1407   Sepsis Labs Invalid input(s): PROCALCITONIN,  WBC,  LACTICIDVEN Microbiology No results found for this or any previous visit (from the past 240 hour(s)).   Time coordinating discharge: Over 30 minutes  SIGNED:   Darliss Cheney, MD  [facility] 05/09/2021, 11:25 AM  If 7PM-7AM, please contact night-coverage [email or web address]

## 2021-05-09 NOTE — Progress Notes (Signed)
PHARMACY - TOTAL PARENTERAL NUTRITION CONSULT NOTE   Indication: Gyn malignancy, subacute bowel obstruction, NPO  Patient Measurements: Height: '5\' 8"'$  (172.7 cm) Weight: 87.6 kg (193 lb 2 oz) IBW/kg (Calculated) : 63.9 TPN AdjBW (KG): 77 Body mass index is 29.36 kg/m. 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 Pharmacy consulted to manage TPN on 8/8.  Glucose / Insulin: No hx DM. A1c 6.4%. Goal CBG <180.  - CBGs 123 & [id number] while on cyclic TPN, 1 U SSI given; serum glucose 125  Electrolytes: all lytes WNL Renal: SCr and BUN - WNL, stable Hepatic: LFTs WNL, TG 44 (8/15), PreAlb 10.8 low but increasing (8/15) Intake / Output; MIVF: Strict I/O not measured - UOP: unmeasured x 6, stool  x 3 charted/24 hrs -MIVF stopped on 8/13 GI Imaging: -8/7 Abd/Pelvic CT with contrast: peritoneal carcinomatosis, moderate ascites, diffuse omental caking/cystic areas - none amenable to percutaneous biopsy. GI Surgeries / Procedures: Diagnostic Lap with biopsy 8/8 Onc note: likely GI primary - signet ring Ca  Central access: Has PAC placed 8/4, PICC 8/12 TPN start date: 8/8  Nutritional Goals (per RD recommendation on 8/5) kCal: 2100-2300, Protein: 100-115gm , Fluid: > 2L per day   Goal TPN rate 80 mL/hr (provides 106 g of protein and 2108 kcals per day)  Current Nutrition:  TPN, CLD -Patient tolerating some clear liquids. Plan is to discharge on TPN  Plan:  Pt to D/C home today> [employer] to make TPN starting today At 1800: continue cycling TPN Cycle TPN over 12 hours  Will provide 106 g protein, 2108 kcals, and 326 g dextrose Electrolytes in TPN: No changes Na 100 mEq/L K 20 mEq/L Ca 0 mEq/L Mg 54mq/L Phos 8 mmol/L Cl:Ac 1:1 Add standard MVI and trace elements to TPN Add folic acid to TPN (ok with Oncology) IV PPI changed to PO - OK w/ [facility] MD  Patient to discharge on TPN, home health  aware.  BEudelia BunchPharmD 05/09/2021,7:45 AM

## 2021-05-09 NOTE — Discharge Instructions (Signed)
Information on my medicine - ELIQUIS (apixaban)  This medication education was reviewed with me or my healthcare representative as part of my discharge preparation.  Why was Eliquis prescribed for you? Eliquis was prescribed to treat blood clots that may have been found in the veins of your legs (deep vein thrombosis) or in your lungs (pulmonary embolism) and to reduce the risk of them occurring again.  What do You need to know about Eliquis ? The starting dose is 10 mg (two 5 mg tablets) taken TWICE daily for the FIRST SEVEN (7) DAYS, then on Wednesday 05/16/2021 the dose is reduced to ONE 5 mg tablet taken TWICE daily.  Eliquis may be taken with or without food.   Try to take the dose about the same time in the morning and in the evening. If you have difficulty swallowing the tablet whole please discuss with your pharmacist how to take the medication safely.  Take Eliquis exactly as prescribed and DO NOT stop taking Eliquis without talking to the doctor who prescribed the medication.  Stopping may increase your risk of developing a new blood clot.  Refill your prescription before you run out.  After discharge, you should have regular check-up appointments with your healthcare provider that is prescribing your Eliquis.    What do you do if you miss a dose? If a dose of ELIQUIS is not taken at the scheduled time, take it as soon as possible on the same day and twice-daily administration should be resumed. The dose should not be doubled to make up for a missed dose.  Important Safety Information A possible side effect of Eliquis is bleeding. You should call your healthcare provider right away if you experience any of the following: Bleeding from an injury or your nose that does not stop. Unusual colored urine (red or dark brown) or unusual colored stools (red or black). Unusual bruising for unknown reasons. A serious fall or if you hit your head (even if there is no bleeding).  Some  medicines may interact with Eliquis and might increase your risk of bleeding or clotting while on Eliquis. To help avoid this, consult your healthcare provider or pharmacist prior to using any new prescription or non-prescription medications, including herbals, vitamins, non-steroidal anti-inflammatory drugs (NSAIDs) and supplements.  This website has more information on Eliquis (apixaban): http://www.eliquis.com/eliquis/home

## 2021-05-09 NOTE — Anesthesia Postprocedure Evaluation (Signed)
Anesthesia Post Note  Patient: Brenda Stewart  Procedure(s) Performed: ESOPHAGOGASTRODUODENOSCOPY (EGD) WITH PROPOFOL ESOPHAGEAL BRUSHING BIOPSY     Patient location during evaluation: PACU Anesthesia Type: MAC Level of consciousness: awake and alert Pain management: pain level controlled Vital Signs Assessment: post-procedure vital signs reviewed and stable Respiratory status: spontaneous breathing, nonlabored ventilation, respiratory function stable and patient connected to nasal cannula oxygen Cardiovascular status: stable and blood pressure returned to baseline Postop Assessment: no apparent nausea or vomiting Anesthetic complications: no   No notable events documented.  Last Vitals:  Vitals:   05/08/21 2110 05/09/21 0508  BP: 123/72 130/72  Pulse: 98 95  Resp: 16 16  Temp: 36.4 C 36.6 C  SpO2: 97% 96%    Last Pain:  Vitals:   05/09/21 1000  TempSrc:   PainSc: 0-No pain                 Tiajuana Amass

## 2021-05-09 NOTE — Progress Notes (Signed)
[place] for Heparin>>Apixaban Indication: pulmonary embolus  No Known Allergies  Patient Measurements: Height: '5\' 8"'$  (172.7 cm) Weight: 87.6 kg (193 lb 2 oz) IBW/kg (Calculated) : 63.9 Heparin Dosing Weight: 81 kg  Vital Signs: Temp: 97.9 F (36.6 C) (08/17 0508) Temp Source: Oral (08/17 0508) BP: 130/72 (08/17 0508) Pulse Rate: 95 (08/17 0508)  Labs: Recent Labs    05/07/21 0549 05/08/21 0130 05/08/21 1041 05/08/21 1700 05/09/21 0536  HGB 9.0*  --  8.7*  --  8.0*  HCT 27.9*  --  27.3*  --  25.3*  PLT 354  --  339  --  288  HEPARINUNFRC 0.21*   < > 0.42 0.60 0.39  CREATININE 0.76  --   --   --   --    < > = values in this interval not displayed.    Estimated Creatinine Clearance: 78 mL/min (by C-G formula based on SCr of 0.76 mg/dL).  Assessment: 49 yoF with peritoneal carcinomatosis - now identified as Signet ring Ca, Chemo Folfox to begin 8/12.  NPO since admit for SBO, Diagnostic Lap 8/8, TPN started 8/8.  8/12 CT w/contrast for staging > PE. Pharmacy consulted to dose/monitor heparin drip  Today, 05/09/21 AM heparin level therapeutic at 0.38 on 1550 units/hr CBC: Hgb 8.0 low - slowly trending down.  Plt WNL & stable No signs of bleeding reported 8/16 U/S R arm negative for abscess To transition to apixaban today  Goal of Therapy:  Heparin level 0.3-0.7 units/ml Monitor platelets by anticoagulation protocol: Yes   Plan:  DC heparin drip at 10 am & start apixaban Apixaban 10 mg po bid x 7 days followed by apixaban 5 mg po bid DC heparin levels & daily CBC Pharmacy to provide Eliquis education and coupons prior to discharge Eudelia Bunch, Pharm.D 05/09/2021 7:29 AM

## 2021-05-16 ENCOUNTER — Encounter: Payer: Self-pay | Admitting: Hematology

## 2021-05-16 ENCOUNTER — Inpatient Hospital Stay: Payer: Medicare Other | Attending: Hematology

## 2021-05-16 ENCOUNTER — Inpatient Hospital Stay (HOSPITAL_BASED_OUTPATIENT_CLINIC_OR_DEPARTMENT_OTHER): Payer: Medicare Other | Admitting: Hematology

## 2021-05-16 ENCOUNTER — Other Ambulatory Visit: Payer: Self-pay

## 2021-05-16 VITALS — BP 118/80 | HR 102 | Temp 98.7°F | Resp 20 | Ht 68.0 in | Wt 186.5 lb

## 2021-05-16 DIAGNOSIS — Z79899 Other long term (current) drug therapy: Secondary | ICD-10-CM | POA: Diagnosis not present

## 2021-05-16 DIAGNOSIS — D649 Anemia, unspecified: Secondary | ICD-10-CM | POA: Diagnosis not present

## 2021-05-16 DIAGNOSIS — C801 Malignant (primary) neoplasm, unspecified: Secondary | ICD-10-CM | POA: Insufficient documentation

## 2021-05-16 DIAGNOSIS — Z5111 Encounter for antineoplastic chemotherapy: Secondary | ICD-10-CM | POA: Insufficient documentation

## 2021-05-16 DIAGNOSIS — C786 Secondary malignant neoplasm of retroperitoneum and peritoneum: Secondary | ICD-10-CM

## 2021-05-16 DIAGNOSIS — R1030 Lower abdominal pain, unspecified: Secondary | ICD-10-CM | POA: Diagnosis not present

## 2021-05-16 DIAGNOSIS — C482 Malignant neoplasm of peritoneum, unspecified: Secondary | ICD-10-CM

## 2021-05-16 DIAGNOSIS — D490 Neoplasm of unspecified behavior of digestive system: Secondary | ICD-10-CM

## 2021-05-16 DIAGNOSIS — E46 Unspecified protein-calorie malnutrition: Secondary | ICD-10-CM | POA: Diagnosis not present

## 2021-05-16 DIAGNOSIS — K56699 Other intestinal obstruction unspecified as to partial versus complete obstruction: Secondary | ICD-10-CM | POA: Diagnosis not present

## 2021-05-16 DIAGNOSIS — Z95828 Presence of other vascular implants and grafts: Secondary | ICD-10-CM

## 2021-05-16 LAB — CBC WITH DIFFERENTIAL (CANCER CENTER ONLY)
Abs Immature Granulocytes: 0.04 10*3/uL (ref 0.00–0.07)
Basophils Absolute: 0 10*3/uL (ref 0.0–0.1)
Basophils Relative: 1 %
Eosinophils Absolute: 0.1 10*3/uL (ref 0.0–0.5)
Eosinophils Relative: 2 %
HCT: 26.5 % — ABNORMAL LOW (ref 36.0–46.0)
Hemoglobin: 9 g/dL — ABNORMAL LOW (ref 12.0–15.0)
Immature Granulocytes: 1 %
Lymphocytes Relative: 40 %
Lymphs Abs: 1.8 10*3/uL (ref 0.7–4.0)
MCH: 30.7 pg (ref 26.0–34.0)
MCHC: 34 g/dL (ref 30.0–36.0)
MCV: 90.4 fL (ref 80.0–100.0)
Monocytes Absolute: 0.4 10*3/uL (ref 0.1–1.0)
Monocytes Relative: 8 %
Neutro Abs: 2.2 10*3/uL (ref 1.7–7.7)
Neutrophils Relative %: 48 %
Platelet Count: 310 10*3/uL (ref 150–400)
RBC: 2.93 MIL/uL — ABNORMAL LOW (ref 3.87–5.11)
RDW: 13.8 % (ref 11.5–15.5)
WBC Count: 4.3 10*3/uL (ref 4.0–10.5)
nRBC: 0 % (ref 0.0–0.2)

## 2021-05-16 LAB — CMP (CANCER CENTER ONLY)
ALT: 12 U/L (ref 0–44)
AST: 22 U/L (ref 15–41)
Albumin: 2.7 g/dL — ABNORMAL LOW (ref 3.5–5.0)
Alkaline Phosphatase: 65 U/L (ref 38–126)
Anion gap: 10 (ref 5–15)
BUN: 22 mg/dL (ref 8–23)
CO2: 24 mmol/L (ref 22–32)
Calcium: 8.4 mg/dL — ABNORMAL LOW (ref 8.9–10.3)
Chloride: 102 mmol/L (ref 98–111)
Creatinine: 0.77 mg/dL (ref 0.44–1.00)
GFR, Estimated: >60 mL/min (ref >60)
Glucose, Bld: 115 mg/dL — ABNORMAL HIGH (ref 70–99)
Potassium: 3.9 mmol/L (ref 3.5–5.1)
Sodium: 136 mmol/L (ref 135–145)
Total Bilirubin: 0.2 mg/dL — ABNORMAL LOW (ref 0.3–1.2)
Total Protein: 7.2 g/dL (ref 6.5–8.1)

## 2021-05-16 MED ORDER — SODIUM CHLORIDE 0.9% FLUSH
10.0000 mL | Freq: Once | INTRAVENOUS | Status: AC
  Administered 2021-05-16: 10 mL via INTRAVENOUS

## 2021-05-16 MED ORDER — HEPARIN SOD (PORK) LOCK FLUSH 100 UNIT/ML IV SOLN
500.0000 [IU] | Freq: Once | INTRAVENOUS | Status: AC
  Administered 2021-05-16: 500 [IU] via INTRAVENOUS

## 2021-05-16 NOTE — Progress Notes (Addendum)
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 She was last seen by me in the hospital on 05/08/21. She presents to the clinic alone but her daughter came in with her.   She notes improvement since leaving the hospital. She continues on a liquid diet. She notes she has not tried any nutritional supplements yet. Mild fatigue is stable, she is able to function at home.    All other systems were reviewed with the patient and are negative.  MEDICAL HISTORY:  Past Medical History:  Diagnosis Date   Acid reflux    Hypertension     SURGICAL HISTORY: Past Surgical History:  Procedure Laterality Date   BIOPSY  05/07/2021   Procedure: BIOPSY;  Surgeon: [name], MD;  Location: [facility];  Service: Endoscopy;;   BUNIONECTOMY Bilateral    ESOPHAGEAL BRUSHING  05/07/2021   Procedure: ESOPHAGEAL BRUSHING;  Surgeon: [name], MD;  Location: [facility];  Service: Endoscopy;;   ESOPHAGOGASTRODUODENOSCOPY (EGD) WITH PROPOFOL N/A 05/07/2021   Procedure: ESOPHAGOGASTRODUODENOSCOPY (EGD) WITH PROPOFOL;  Surgeon: [name], MD;  Location: [facility];  Service: Endoscopy;  Laterality: N/A;   HAND TENDON SURGERY Bilateral    IR IMAGING GUIDED PORT INSERTION  04/26/2021   IR PARACENTESIS  04/24/2021   LAPAROSCOPY N/A 04/30/2021   Procedure:  LAPAROSCOPY DIAGNOSTIC WITH BIOPSIES;  Surgeon: Lafonda Mosses, MD;  Location: [facility];  Service: Gynecology;  Laterality: N/A;   PARTIAL HYSTERECTOMY      I have reviewed the social history and family history with the patient and they are unchanged from previous note.  ALLERGIES:  has No Known Allergies.  MEDICATIONS:  Current Outpatient Medications  Medication Sig Dispense Refill   APIXABAN (ELIQUIS) VTE STARTER PACK (10MG AND 5MG) Take as directed on package: start with two-75m tablets twice daily for 7 days. On day 8, switch to one-535mtablet twice daily. 1 each 0   ascorbic acid (VITAMIN C) 250 MG CHEW Chew 500 mg by mouth daily.     aspirin EC 81 MG tablet Take 81 mg by mouth daily. Swallow whole.     Cholecalciferol (VITAMIN D3) 50 MCG (2000 UT) CHEW Chew 2 tablets by mouth daily.     folic acid (FOLVITE) 1 MG tablet Take 1 tablet (1 mg total) by mouth daily. 30 tablet 3   omeprazole (PRILOSEC) 20 MG capsule Take 20 mg by mouth daily as needed (heartburn/acid reflux).     sennosides-docusate sodium (SENOKOT-S) 8.6-50 MG tablet Take 2 tablets by mouth daily as needed for constipation.     No current facility-administered medications for this visit.    PHYSICAL EXAMINATION: ECOG PERFORMANCE STATUS: 2 - Symptomatic, <50% confined to bed  Vitals:   05/16/21 1216  BP: 118/80  Pulse: (!) 102  Resp: 20  Temp: 98.7 F (37.1 C)  SpO2: 100%   Wt Readings from Last 3 Encounters:  05/16/21 186 lb 8 oz (84.6 kg)  05/09/21 193 lb 2 oz (87.6 kg)     GENERAL:alert, no distress and comfortable SKIN: skin color, texture, turgor are normal, no rashes or significant lesions EYES: normal, Conjunctiva are pink and non-injected, sclera clear  NECK: supple, thyroid normal size, non-tender, without nodularity LYMPH:  no palpable lymphadenopathy in the cervical, axillary  LUNGS: clear to auscultation and percussion with normal breathing effort HEART: regular rate & rhythm and no murmurs  and no lower extremity edema ABDOMEN:abdomen soft, non-tender and normal bowel sounds Musculoskeletal:no cyanosis of digits and no clubbing  NEURO: alert & oriented x 3 with fluent speech, no focal motor/sensory deficits  LABORATORY DATA:  I have reviewed the data as listed CBC Latest Ref Rng & Units 05/16/2021 05/09/2021 05/08/2021  WBC 4.0 - 10.5 K/uL 4.3 6.6 7.7  Hemoglobin 12.0 - 15.0 g/dL 9.0(L) 8.0(L) 8.7(L)  Hematocrit 36.0 - 46.0 % 26.5(L) 25.3(L) 27.3(L)  Platelets 150 - 400 K/uL 310 288 339     CMP Latest Ref Rng & Units 05/16/2021 05/09/2021 05/07/2021  Glucose 70 - 99 mg/dL 115(H) 125(H) 131(H)  BUN 8 - 23 mg/dL 22 24(H) 24(H)  Creatinine 0.44 - 1.00 mg/dL 0.77 0.71 0.76  Sodium 135 - 145 mmol/L 136 136 135  Potassium 3.5 - 5.1 mmol/L 3.9 4.0 4.0  Chloride 98 - 111 mmol/L 102 106 104  CO2 22 - 32 mmol/L '24 23 25  ' Calcium 8.9 - 10.3 mg/dL 8.4(L) 8.3(L) 8.5(L)  Total Protein 6.5 - 8.1 g/dL 7.2 - 6.2(L)  Total Bilirubin 0.3 - 1.2 mg/dL 0.2(L) - 0.4  Alkaline Phos 38 - 126 U/L 65 - 56  AST 15 - 41 U/L 22 - 18  ALT 0 - 44 U/L 12 - 11      RADIOGRAPHIC STUDIES: I have personally reviewed the radiological images as listed and agreed with the findings in the report. No results found.   ASSESSMENT & PLAN:  Brenda Stewart is a 70 y.o. female with   1. 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 CT AP 04/21/21 showed findings suspicious for diffuse peritoneal carcinomatosis. She was admitted and underwent paracentesis on 04/24/21 showing atypical cells. -Diagnostic laparoscopy on 04/30/21 with pathology from peritoneum biopsy showing metastatic carcinoma with signet ring cell features. IHC supports upper GI primary.  -I started her on FOLFOX on 05/04/21.  -EGD on 05/07/21 with Dr. Collene Mares was benign. -She tolerated her first cycle of FOLFOX  well.  2. Small bowel obstruction, secondary to peritoneal carcinomatosis, Protein calorie malnutrition -she was started on TPN in the hospital, will continue  -continues on liquid diet -she has not tried nutritional supplements. I will provide her with samples today. -May advance her diet to soft next week  3. Mild Anemia -found to have anemia while in the hospital -hgb up to 9 today (05/16/21)   PLAN: -proceed with C2 FOLFOX on 05/21/21 -I will follow-up with pathology regarding his MMR and PD-L1 test results -will let her try nutritional supplement such as Ensure, and a nutrition referral    No problem-specific Assessment & Plan notes found for this encounter.   Orders Placed This Encounter  Procedures   Ambulatory Referral to [facility] Nutrition    Referral Priority:  Routine    Referral Type:   Consultation    Referral Reason:   Specialty Services Required    Requested Specialty:   Oncology    Number of Visits Requested:   1   All questions were answered. The patient knows to call the clinic with any problems, questions or concerns. No barriers to learning was detected. The total time spent in the appointment was 30 minutes.     [name], MD 05/16/2021   I, Wilburn Mylar, am acting as scribe for [name], MD.   I have reviewed the above documentation for accuracy and completeness, and I agree with the above.

## 2021-05-17 ENCOUNTER — Telehealth: Payer: Self-pay | Admitting: Hematology

## 2021-05-17 NOTE — Telephone Encounter (Signed)
Scheduled appt per 8/24 sch msg. Pt aware.

## 2021-05-18 ENCOUNTER — Telehealth: Payer: Self-pay | Admitting: Hematology

## 2021-05-18 ENCOUNTER — Encounter: Payer: Self-pay | Admitting: Hematology

## 2021-05-18 LAB — SURGICAL PATHOLOGY

## 2021-05-18 NOTE — Telephone Encounter (Signed)
Scheduled follow-up appointments per 8/24 los. Patient is aware. 

## 2021-05-21 ENCOUNTER — Inpatient Hospital Stay: Payer: Medicare Other

## 2021-05-21 ENCOUNTER — Encounter: Payer: Self-pay | Admitting: Hematology

## 2021-05-21 ENCOUNTER — Other Ambulatory Visit: Payer: Self-pay

## 2021-05-21 ENCOUNTER — Inpatient Hospital Stay (HOSPITAL_BASED_OUTPATIENT_CLINIC_OR_DEPARTMENT_OTHER): Payer: Medicare Other | Admitting: Hematology

## 2021-05-21 VITALS — BP 144/89 | HR 88 | Temp 98.8°F | Resp 16 | Ht 68.0 in | Wt 169.8 lb

## 2021-05-21 DIAGNOSIS — C786 Secondary malignant neoplasm of retroperitoneum and peritoneum: Secondary | ICD-10-CM

## 2021-05-21 DIAGNOSIS — D490 Neoplasm of unspecified behavior of digestive system: Secondary | ICD-10-CM

## 2021-05-21 DIAGNOSIS — Z95828 Presence of other vascular implants and grafts: Secondary | ICD-10-CM

## 2021-05-21 DIAGNOSIS — C482 Malignant neoplasm of peritoneum, unspecified: Secondary | ICD-10-CM

## 2021-05-21 DIAGNOSIS — Z5111 Encounter for antineoplastic chemotherapy: Secondary | ICD-10-CM | POA: Diagnosis not present

## 2021-05-21 LAB — CBC WITH DIFFERENTIAL (CANCER CENTER ONLY)
Abs Immature Granulocytes: 0.47 10*3/uL — ABNORMAL HIGH (ref 0.00–0.07)
Basophils Absolute: 0 10*3/uL (ref 0.0–0.1)
Basophils Relative: 1 %
Eosinophils Absolute: 0.1 10*3/uL (ref 0.0–0.5)
Eosinophils Relative: 2 %
HCT: 26.9 % — ABNORMAL LOW (ref 36.0–46.0)
Hemoglobin: 8.9 g/dL — ABNORMAL LOW (ref 12.0–15.0)
Immature Granulocytes: 8 %
Lymphocytes Relative: 31 %
Lymphs Abs: 1.9 10*3/uL (ref 0.7–4.0)
MCH: 30 pg (ref 26.0–34.0)
MCHC: 33.1 g/dL (ref 30.0–36.0)
MCV: 90.6 fL (ref 80.0–100.0)
Monocytes Absolute: 0.6 10*3/uL (ref 0.1–1.0)
Monocytes Relative: 10 %
Neutro Abs: 2.9 10*3/uL (ref 1.7–7.7)
Neutrophils Relative %: 48 %
Platelet Count: 367 10*3/uL (ref 150–400)
RBC: 2.97 MIL/uL — ABNORMAL LOW (ref 3.87–5.11)
RDW: 14.6 % (ref 11.5–15.5)
WBC Count: 6 10*3/uL (ref 4.0–10.5)
nRBC: 0 % (ref 0.0–0.2)

## 2021-05-21 LAB — CMP (CANCER CENTER ONLY)
ALT: 11 U/L (ref 0–44)
AST: 21 U/L (ref 15–41)
Albumin: 2.7 g/dL — ABNORMAL LOW (ref 3.5–5.0)
Alkaline Phosphatase: 74 U/L (ref 38–126)
Anion gap: 10 (ref 5–15)
BUN: 19 mg/dL (ref 8–23)
CO2: 25 mmol/L (ref 22–32)
Calcium: 8.6 mg/dL — ABNORMAL LOW (ref 8.9–10.3)
Chloride: 103 mmol/L (ref 98–111)
Creatinine: 0.71 mg/dL (ref 0.44–1.00)
GFR, Estimated: >60 mL/min (ref >60)
Glucose, Bld: 106 mg/dL — ABNORMAL HIGH (ref 70–99)
Potassium: 3.8 mmol/L (ref 3.5–5.1)
Sodium: 138 mmol/L (ref 135–145)
Total Bilirubin: 0.3 mg/dL (ref 0.3–1.2)
Total Protein: 7.3 g/dL (ref 6.5–8.1)

## 2021-05-21 MED ORDER — LEUCOVORIN CALCIUM INJECTION 350 MG
400.0000 mg/m2 | Freq: Once | INTRAVENOUS | Status: AC
  Administered 2021-05-21: 816 mg via INTRAVENOUS
  Filled 2021-05-21: qty 40.8

## 2021-05-21 MED ORDER — PALONOSETRON HCL INJECTION 0.25 MG/5ML
0.2500 mg | Freq: Once | INTRAVENOUS | Status: AC
  Administered 2021-05-21: 0.25 mg via INTRAVENOUS
  Filled 2021-05-21: qty 5

## 2021-05-21 MED ORDER — SODIUM CHLORIDE 0.9 % IV SOLN
2400.0000 mg/m2 | INTRAVENOUS | Status: DC
  Administered 2021-05-21: 4900 mg via INTRAVENOUS
  Filled 2021-05-21: qty 98

## 2021-05-21 MED ORDER — HEPARIN SOD (PORK) LOCK FLUSH 100 UNIT/ML IV SOLN: 500.0000 [IU] | Freq: Once | INTRAVENOUS | Status: DC | PRN

## 2021-05-21 MED ORDER — DEXTROSE 5 % IV SOLN: Freq: Once | INTRAVENOUS | Status: AC

## 2021-05-21 MED ORDER — SODIUM CHLORIDE 0.9% FLUSH
10.0000 mL | INTRAVENOUS | Status: DC | PRN
Start: 2021-05-21 — End: 2021-05-21

## 2021-05-21 MED ORDER — SODIUM CHLORIDE 0.9 % IV SOLN
10.0000 mg | Freq: Once | INTRAVENOUS | Status: AC
  Administered 2021-05-21: 10 mg via INTRAVENOUS
  Filled 2021-05-21: qty 10

## 2021-05-21 MED ORDER — SODIUM CHLORIDE 0.9% FLUSH
10.0000 mL | INTRAVENOUS | Status: DC | PRN
  Administered 2021-05-21: 10 mL via INTRAVENOUS

## 2021-05-21 MED ORDER — OXALIPLATIN CHEMO INJECTION 100 MG/20ML
85.0000 mg/m2 | Freq: Once | INTRAVENOUS | Status: AC
  Administered 2021-05-21: 175 mg via INTRAVENOUS
  Filled 2021-05-21: qty 35

## 2021-05-21 NOTE — Progress Notes (Signed)
[facility]   Telephone:(336) [phone] Fax:(336) [phone]   Clinic Follow up Note   Patient Care Team: Pcp, No as PCP - General  Date of Service:  05/21/2021  CHIEF COMPLAINT: f/u of peritoneal carcinomatosis  CURRENT THERAPY:  First line FOLFOX every 2 weeks, started 05/04/21  ASSESSMENT & PLAN:  Brenda Stewart is a 70 y.o. female with   1. 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 CT AP 04/21/21 showed findings suspicious for diffuse peritoneal carcinomatosis. She was admitted and underwent paracentesis on 04/24/21 showing atypical cells. -Diagnostic laparoscopy on 04/30/21 with pathology from peritoneum biopsy showing metastatic carcinoma with signet ring cell features. IHC supports upper GI primary.  -Her EGD was negative for primary malignancy. -I started her on FOLFOX on 05/04/21. She tolerated her first cycle of FOLFOX well. -Labs reviewed, overall adequate to continue treatment. She will proceed with C2 FOLFOX today.   2. Small bowel obstruction, secondary to peritoneal carcinomatosis, Protein calorie malnutrition -she was started on TPN in the hospital, will continue  -continues on liquid diet, supplementing with Boost. I will try to provide her with a case today. -we discussed soft foods she can try next week, such as chicken noodle soup, mashed potatoes. -OK to hold TPN during her cycle 3 FOLFOX  -f/u with dietician    3. Mild Anemia -found to have anemia while in the hospital -stable, hgb at 8.9 today (05/21/21)     PLAN: -proceed with C2 FOLFOX today at full dose without 5-fu bolus  -f/u with dietician    No problem-specific Assessment & Plan notes found for this encounter.   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   Suspected distal small bowel obstruction, with probable transition point in right lower quadrant.   Aortic Atherosclerosis (ICD10-I70.0).   04/22/2021 Imaging   US Abdomen  IMPRESSION: Small volume of lower abdominopelvic ascites.   04/24/2021 Pathology Results   Cytology  Specimen Submitted:  A. ASCITES, PARACENTESIS:   FINAL MICROSCOPIC DIAGNOSIS:  - Atypical cells present  DIAGNOSTIC COMMENTS:  Immunohistochemical stains show that the rare, mildly atypical cells are positive for calretinin and D2-40; while they are negative for PAX8, ER and MOC-31.  This immunoprofile is consistent with reactive mesothelial cells.    04/25/2021 Initial Diagnosis   Primary peritoneal carcinomatosis ([place])   04/29/2021 Imaging   CT AP  IMPRESSION: 1. Moderate ascites, mild peritoneal thickening and diffuse omental thickening/caking and smaller focal fluid/cystic areas within the RIGHT abdomen and LOWER pelvis, compatible with peritoneal carcinomatosis. Mild mesenteric stranding is nonspecific. 2. Small subserosal implants along the INFERIOR RIGHT liver. 3. Small LEFT pleural effusion and trace RIGHT pleural effusion with LEFT LOWER lung atelectasis. 4. Aortic Atherosclerosis (ICD10-I70.0).   04/30/2021 Pathology Results   FINAL MICROSCOPIC DIAGNOSIS:   A. 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   ADDENDUM:  By  immunohistochemistry, HER-2 is EQUIVOCAL (2+).    FLOURESCENCE IN-SITU HYBRIDIZATION RESULTS:  GROUP 5:   HER2 **NEGATIVE**    05/04/2021 -  Chemotherapy    Patient is on Treatment Plan: GASTRIC FOLFOX Q14D X 12 CYCLES       05/04/2021 Imaging   CT Chest  IMPRESSION: 1. Suspected segmental filling defects in the anterior segment right upper lobe and apical segment right upper lobe compatible with small pulmonary emboli. Clot burden appears small, although please note that today's examination was not a dedicated CT angiogram and as such might overestimate or underestimate pulmonary embolus. Positive for acute PE with CT evidence of right heart strain (RV/LV Ratio = 1.1) consistent with at least submassive (intermediate risk) PE. The presence of right heart strain has been associated with an increased risk of morbidity and mortality. Please refer to the "PE Focused" order set in EPIC. 2. No compelling findings of metastatic disease to the chest. There is a small left pleural effusion, but no definite enhancement along the pleura to suggest malignant or exudative etiology at this time. 3. There is some new gas in the soft tissues of the left thoracoabdominal wall, compatible subcutaneous emphysema, probably related to recent laparoscopy or other procedure. 4. Hypodense thyroid nodules up to 2.1 cm in diameter. Recommend thyroid US (ref: J Am Coll Radiol. 2015 Feb;12(2): 143-50). 5. Left upper quadrant ascites, including fluid in the lesser sac. This is reduced compared to the prior CT abdomen of 04/29/2021. 6. Stable appearance of periportal edema and hypodensities along the posterior margin of the right hepatic lobe.   05/07/2021 Pathology Results   FINAL MICROSCOPIC DIAGNOSIS:   A. DUODENUM, BULB, BIOPSY:  - Polypoid duodenal mucosa with chronic inflammation, foveolar  metaplasia and reactive changes  - Negative for dysplasia or malignancy   Cytology: Specimen Submitted:  A.  ESOPHAGUS, BRUSHING:   FINAL MICROSCOPIC DIAGNOSIS:  - Benign reactive/reparative changes    05/16/2021 Cancer Staging   Staging form: Soft Tissue Sarcoma of the Abdomen and Thoracic Visceral Organs, AJCC 8th Edition - Clinical: cTX, cN0, pM1 - Signed by [name], MD on 05/16/2021      INTERVAL HISTORY:  Brenda Stewart is here for a follow up of peritoneal carcinomatosis. She was last seen by me on 05/16/21. She presents to the clinic alone. She denies any nausea or vomiting. She reports she is able to function and do all her activities herself.   All other systems were reviewed with the patient and are negative.  MEDICAL HISTORY:  Past Medical History:  Diagnosis Date   Acid reflux    Hypertension     SURGICAL HISTORY: Past Surgical History:  Procedure Laterality Date   BIOPSY  05/07/2021   Procedure: BIOPSY;  Surgeon: [name], MD;  [facility];  Service: Endoscopy;;   BUNIONECTOMY Bilateral    ESOPHAGEAL BRUSHING  05/07/2021   Procedure: ESOPHAGEAL BRUSHING;  Surgeon: [name], MD;  [facility];  Service: Endoscopy;;   ESOPHAGOGASTRODUODENOSCOPY (EGD) WITH PROPOFOL N/A 05/07/2021   Procedure: ESOPHAGOGASTRODUODENOSCOPY (EGD) WITH PROPOFOL;  Surgeon: [name], MD;  [facility];  Service: Endoscopy;  Laterality: N/A;   HAND TENDON SURGERY Bilateral    IR IMAGING GUIDED PORT INSERTION  04/26/2021   IR PARACENTESIS  04/24/2021   LAPAROSCOPY N/A 04/30/2021   Procedure: LAPAROSCOPY DIAGNOSTIC WITH BIOPSIES;  Surgeon: Berline Lopes,  Corinna Lines, MD;  Location: [facility];  Service: Gynecology;  Laterality: N/A;   PARTIAL HYSTERECTOMY      I have reviewed the social history and family history with the patient and they are unchanged from previous note.  ALLERGIES:  has No Known Allergies.  MEDICATIONS:  Current Outpatient Medications  Medication Sig Dispense Refill   APIXABAN (ELIQUIS) VTE STARTER PACK (10MG AND 5MG) Take as directed on package:  start with two-35m tablets twice daily for 7 days. On day 8, switch to one-517mtablet twice daily. 1 each 0   ascorbic acid (VITAMIN C) 250 MG CHEW Chew 500 mg by mouth daily.     aspirin EC 81 MG tablet Take 81 mg by mouth daily. Swallow whole.     Cholecalciferol (VITAMIN D3) 50 MCG (2000 UT) CHEW Chew 2 tablets by mouth daily.     folic acid (FOLVITE) 1 MG tablet Take 1 tablet (1 mg total) by mouth daily. 30 tablet 3   omeprazole (PRILOSEC) 20 MG capsule Take 20 mg by mouth daily as needed (heartburn/acid reflux).     sennosides-docusate sodium (SENOKOT-S) 8.6-50 MG tablet Take 2 tablets by mouth daily as needed for constipation.     No current facility-administered medications for this visit.    PHYSICAL EXAMINATION: ECOG PERFORMANCE STATUS: 1 - Symptomatic but completely ambulatory  Vitals:   05/21/21 1200  BP: (!) 144/89  Pulse: 88  Resp: 16  Temp: 98.8 F (37.1 C)  SpO2: 99%   Wt Readings from Last 3 Encounters:  05/21/21 169 lb 12.8 oz (77 kg)  05/16/21 186 lb 8 oz (84.6 kg)  05/09/21 193 lb 2 oz (87.6 kg)     GENERAL:alert, no distress and comfortable SKIN: skin color normal, no rashes or significant lesions EYES: normal, Conjunctiva are pink and non-injected, sclera clear  NEURO: alert & oriented x 3 with fluent speech  LABORATORY DATA:  I have reviewed the data as listed CBC Latest Ref Rng & Units 05/21/2021 05/16/2021 05/09/2021  WBC 4.0 - 10.5 K/uL 6.0 4.3 6.6  Hemoglobin 12.0 - 15.0 g/dL 8.9(L) 9.0(L) 8.0(L)  Hematocrit 36.0 - 46.0 % 26.9(L) 26.5(L) 25.3(L)  Platelets 150 - 400 K/uL 367 310 288     CMP Latest Ref Rng & Units 05/21/2021 05/16/2021 05/09/2021  Glucose 70 - 99 mg/dL 106(H) 115(H) 125(H)  BUN 8 - 23 mg/dL 19 22 24(H)  Creatinine 0.44 - 1.00 mg/dL 0.71 0.77 0.71  Sodium 135 - 145 mmol/L 138 136 136  Potassium 3.5 - 5.1 mmol/L 3.8 3.9 4.0  Chloride 98 - 111 mmol/L 103 102 106  CO2 22 - 32 mmol/L _0 Calcium 8.9 - 10.3 mg/dL 8.6(L) 8.4(L)  8.3(L)  Total Protein 6.5 - 8.1 g/dL 7.3 7.2 -  Total Bilirubin 0.3 - 1.2 mg/dL 0.3 0.2(L) -  Alkaline Phos 38 - 126 U/L 74 65 -  AST 15 - 41 U/L 21 22 -  ALT 0 - 44 U/L 11 12 -      RADIOGRAPHIC STUDIES: I have personally reviewed the radiological images as listed and agreed with the findings in the report. No results found.    No orders of the defined types were placed in this encounter.  All questions were answered. The patient knows to call the clinic with any problems, questions or concerns. No barriers to learning was detected. The total time spent in the appointment was 30 minutes.     YaTruitt MerleMD 05/21/2021   I,  Katie Daubenspeck, am acting as scribe for Liz Pinho, MD.   I have reviewed the above documentation for accuracy and completeness, and I agree with the above.     

## 2021-05-21 NOTE — Patient Instructions (Signed)
[facility]  Discharge Instructions: Thank you for choosing [facility] to provide your oncology and hematology care.   If you have a lab appointment with the [facility], please go directly to the [facility] and check in at the registration area.   Wear comfortable clothing and clothing appropriate for easy access to any Portacath or PICC line.   We strive to give you quality time with your provider. You may need to reschedule your appointment if you arrive late (15 or more minutes).  Arriving late affects you and other patients whose appointments are after yours.  Also, if you miss three or more appointments without notifying the office, you may be dismissed from the clinic at the provider's discretion.      For prescription refill requests, have your pharmacy contact our office and allow 72 hours for refills to be completed.    Today you received the following chemotherapy and/or immunotherapy agents Oxaliplatin, leucovorin, and Fluoruracil.      To help prevent nausea and vomiting after your treatment, we encourage you to take your nausea medication as directed.  BELOW ARE SYMPTOMS THAT SHOULD BE REPORTED IMMEDIATELY: *FEVER GREATER THAN 100.4 F (38 C) OR HIGHER *CHILLS OR SWEATING *NAUSEA AND VOMITING THAT IS NOT CONTROLLED WITH YOUR NAUSEA MEDICATION *UNUSUAL SHORTNESS OF BREATH *UNUSUAL BRUISING OR BLEEDING *URINARY PROBLEMS (pain or burning when urinating, or frequent urination) *BOWEL PROBLEMS (unusual diarrhea, constipation, pain near the anus) TENDERNESS IN MOUTH AND THROAT WITH OR WITHOUT PRESENCE OF ULCERS (sore throat, sores in mouth, or a toothache) UNUSUAL RASH, SWELLING OR PAIN  UNUSUAL VAGINAL DISCHARGE OR ITCHING   Items with * indicate a potential emergency and should be followed up as soon as possible or go to the Emergency Department if any problems should occur.  Please show the CHEMOTHERAPY ALERT CARD or  IMMUNOTHERAPY ALERT CARD at check-in to the Emergency Department and triage nurse.  Should you have questions after your visit or need to cancel or reschedule your appointment, please contact [facility]  Dept: [phone]  and follow the prompts.  Office hours are 8:00 a.m. to 4:30 p.m. Monday - Friday. Please note that voicemails left after 4:00 p.m. may not be returned until the following business day.  We are closed weekends and major holidays. You have access to a nurse at all times for urgent questions. Please call the main number to the clinic Dept: [phone] and follow the prompts.   For any non-urgent questions, you may also contact your provider using MyChart. We now offer e-Visits for anyone 42 and older to request care online for non-urgent symptoms. For details visit [email or web address].   Also download the MyChart app! Go to the app store, search "MyChart", open the app, select [facility], and log in with your MyChart username and password.  Due to Covid, a mask is required upon entering the hospital/clinic. If you do not have a mask, one will be given to you upon arrival. For doctor visits, patients may have 1 support person aged 15 or older with them. For treatment visits, patients cannot have anyone with them due to current Covid guidelines and our immunocompromised population.

## 2021-05-22 ENCOUNTER — Encounter: Payer: Self-pay | Admitting: Nutrition

## 2021-05-22 NOTE — Progress Notes (Signed)
I contacted patient by telephone.  Reminded her of her upcoming nutrition appointment which she confirmed.  Offered patient 1 case of Ensure Plus to be picked up at her convenience.  Patient reports her daughter will pick up 1 case of Ensure Plus today and was very appreciative.

## 2021-05-23 ENCOUNTER — Other Ambulatory Visit: Payer: Self-pay

## 2021-05-23 ENCOUNTER — Inpatient Hospital Stay: Payer: Medicare Other

## 2021-05-23 VITALS — BP 128/81 | HR 94 | Temp 98.4°F | Resp 18

## 2021-05-23 DIAGNOSIS — C786 Secondary malignant neoplasm of retroperitoneum and peritoneum: Secondary | ICD-10-CM

## 2021-05-23 DIAGNOSIS — D490 Neoplasm of unspecified behavior of digestive system: Secondary | ICD-10-CM

## 2021-05-23 DIAGNOSIS — Z5111 Encounter for antineoplastic chemotherapy: Secondary | ICD-10-CM | POA: Diagnosis not present

## 2021-05-23 MED ORDER — HEPARIN SOD (PORK) LOCK FLUSH 100 UNIT/ML IV SOLN
500.0000 [IU] | Freq: Once | INTRAVENOUS | Status: AC | PRN
  Administered 2021-05-23: 500 [IU]

## 2021-05-23 MED ORDER — SODIUM CHLORIDE 0.9% FLUSH
10.0000 mL | INTRAVENOUS | Status: DC | PRN
  Administered 2021-05-23: 10 mL

## 2021-05-29 ENCOUNTER — Inpatient Hospital Stay: Payer: Medicare Other | Attending: Hematology | Admitting: Nutrition

## 2021-05-29 ENCOUNTER — Other Ambulatory Visit: Payer: Self-pay

## 2021-05-29 DIAGNOSIS — Z79899 Other long term (current) drug therapy: Secondary | ICD-10-CM | POA: Insufficient documentation

## 2021-05-29 DIAGNOSIS — Z5111 Encounter for antineoplastic chemotherapy: Secondary | ICD-10-CM | POA: Insufficient documentation

## 2021-05-29 DIAGNOSIS — D649 Anemia, unspecified: Secondary | ICD-10-CM | POA: Insufficient documentation

## 2021-05-29 DIAGNOSIS — C786 Secondary malignant neoplasm of retroperitoneum and peritoneum: Secondary | ICD-10-CM | POA: Insufficient documentation

## 2021-05-29 DIAGNOSIS — C801 Malignant (primary) neoplasm, unspecified: Secondary | ICD-10-CM | POA: Insufficient documentation

## 2021-05-29 DIAGNOSIS — K56609 Unspecified intestinal obstruction, unspecified as to partial versus complete obstruction: Secondary | ICD-10-CM | POA: Insufficient documentation

## 2021-05-29 NOTE — Progress Notes (Signed)
70 year old female diagnosed with peritoneal carcinomatosis followed by Dr. Burr Medico.  Patient will be receiving FOLFOX every 14 days for 12 cycles.  Past medical history includes GERD and hypertension.  Medications include vitamin C, vitamin D3, Folvite, Prilosec, Senokot-S.  Labs include glucose 106 and albumin 2.7 on August 29.  Height: 68 inches. Weight: 171.4 pounds. Usual body weight: 193 pounds August 17. BMI: 26.06.  Patient presents to nutrition consult with her daughter.  She was diagnosed with severe protein calorie malnutrition during her recent hospitalization for small bowel obstruction.  TPN was started and is being given through a PICC line..  Patient states she is having 3-4 soft to watery bowel movements daily. She has been adding soft foods and has tolerated mashed potatoes, pured vegetables, chicken noodle soup, pudding, Jell-O, water, and Kool-Aid. Reports she has increased appetite and cannot wait to eat more. Denies nausea, vomiting, and constipation.  Revised estimated nutrition needs: 2250-2500 cal, 112-125 g protein, 2.4 L fluid.  If TPN is at full strength it is providing 2108 cal and 106 g protein.  Nutrition diagnosis: Unintended weight loss related to peritoneal carcinomatosis as evidenced by 11% weight loss in less than 1 month. Severe malnutrition improving.  Intervention: Educated on increasing diet consistency to soft, low fiber diet.  Reviewed slow addition of very soft, low fiber  foods.  Encouraged patient to keep a food diary and document tolerance.  She should continue to avoid higher fiber foods.  I provided a nutrition fact sheet. Continue fluids as tolerated. Recommend weaning TPN as oral intake increases. Questions answered.  Teach back method used.  Contact information given.  Monitoring, evaluation, goals: Patient will tolerate adequate calories and protein to minimize weight loss.  Next visit: Monday, September 26 during  infusion.  **Disclaimer: This note was dictated with voice recognition software. Similar sounding words can inadvertently be transcribed and this note may contain transcription errors which may not have been corrected upon publication of note.**

## 2021-06-01 ENCOUNTER — Encounter: Payer: Self-pay | Admitting: Hematology

## 2021-06-01 MED FILL — Dexamethasone Sodium Phosphate Inj 100 MG/10ML: INTRAMUSCULAR | Qty: 1 | Status: AC

## 2021-06-04 ENCOUNTER — Inpatient Hospital Stay: Payer: Medicare Other

## 2021-06-04 ENCOUNTER — Inpatient Hospital Stay (HOSPITAL_BASED_OUTPATIENT_CLINIC_OR_DEPARTMENT_OTHER): Payer: Medicare Other | Admitting: Hematology

## 2021-06-04 ENCOUNTER — Encounter: Payer: Self-pay | Admitting: Hematology

## 2021-06-04 ENCOUNTER — Other Ambulatory Visit: Payer: Self-pay

## 2021-06-04 VITALS — BP 133/81 | HR 86 | Temp 98.4°F | Resp 18 | Ht 67.0 in | Wt 174.2 lb

## 2021-06-04 DIAGNOSIS — Z5111 Encounter for antineoplastic chemotherapy: Secondary | ICD-10-CM | POA: Diagnosis not present

## 2021-06-04 DIAGNOSIS — D490 Neoplasm of unspecified behavior of digestive system: Secondary | ICD-10-CM

## 2021-06-04 DIAGNOSIS — K56609 Unspecified intestinal obstruction, unspecified as to partial versus complete obstruction: Secondary | ICD-10-CM

## 2021-06-04 DIAGNOSIS — C786 Secondary malignant neoplasm of retroperitoneum and peritoneum: Secondary | ICD-10-CM

## 2021-06-04 DIAGNOSIS — Z95828 Presence of other vascular implants and grafts: Secondary | ICD-10-CM

## 2021-06-04 DIAGNOSIS — C482 Malignant neoplasm of peritoneum, unspecified: Secondary | ICD-10-CM

## 2021-06-04 DIAGNOSIS — D649 Anemia, unspecified: Secondary | ICD-10-CM | POA: Diagnosis not present

## 2021-06-04 DIAGNOSIS — C801 Malignant (primary) neoplasm, unspecified: Secondary | ICD-10-CM | POA: Diagnosis present

## 2021-06-04 DIAGNOSIS — Z79899 Other long term (current) drug therapy: Secondary | ICD-10-CM | POA: Diagnosis not present

## 2021-06-04 LAB — CMP (CANCER CENTER ONLY)
ALT: 9 U/L (ref 0–44)
AST: 19 U/L (ref 15–41)
Albumin: 2.9 g/dL — ABNORMAL LOW (ref 3.5–5.0)
Alkaline Phosphatase: 85 U/L (ref 38–126)
Anion gap: 9 (ref 5–15)
BUN: 23 mg/dL (ref 8–23)
CO2: 25 mmol/L (ref 22–32)
Calcium: 9.2 mg/dL (ref 8.9–10.3)
Chloride: 105 mmol/L (ref 98–111)
Creatinine: 0.79 mg/dL (ref 0.44–1.00)
GFR, Estimated: >60 mL/min (ref >60)
Glucose, Bld: 123 mg/dL — ABNORMAL HIGH (ref 70–99)
Potassium: 3.7 mmol/L (ref 3.5–5.1)
Sodium: 139 mmol/L (ref 135–145)
Total Bilirubin: 0.4 mg/dL (ref 0.3–1.2)
Total Protein: 7.4 g/dL (ref 6.5–8.1)

## 2021-06-04 LAB — CBC WITH DIFFERENTIAL (CANCER CENTER ONLY)
Abs Immature Granulocytes: 0.07 10*3/uL (ref 0.00–0.07)
Basophils Absolute: 0 10*3/uL (ref 0.0–0.1)
Basophils Relative: 0 %
Eosinophils Absolute: 0.1 10*3/uL (ref 0.0–0.5)
Eosinophils Relative: 2 %
HCT: 25.5 % — ABNORMAL LOW (ref 36.0–46.0)
Hemoglobin: 8.4 g/dL — ABNORMAL LOW (ref 12.0–15.0)
Immature Granulocytes: 1 %
Lymphocytes Relative: 40 %
Lymphs Abs: 2 10*3/uL (ref 0.7–4.0)
MCH: 29.8 pg (ref 26.0–34.0)
MCHC: 32.9 g/dL (ref 30.0–36.0)
MCV: 90.4 fL (ref 80.0–100.0)
Monocytes Absolute: 0.4 10*3/uL (ref 0.1–1.0)
Monocytes Relative: 8 %
Neutro Abs: 2.4 10*3/uL (ref 1.7–7.7)
Neutrophils Relative %: 49 %
Platelet Count: 153 10*3/uL (ref 150–400)
RBC: 2.82 MIL/uL — ABNORMAL LOW (ref 3.87–5.11)
RDW: 16 % — ABNORMAL HIGH (ref 11.5–15.5)
WBC Count: 4.9 10*3/uL (ref 4.0–10.5)
nRBC: 0 % (ref 0.0–0.2)

## 2021-06-04 MED ORDER — SODIUM CHLORIDE 0.9% FLUSH
10.0000 mL | Freq: Once | INTRAVENOUS | Status: AC
  Administered 2021-06-04: 10 mL

## 2021-06-04 MED ORDER — LEUCOVORIN CALCIUM INJECTION 350 MG
400.0000 mg/m2 | Freq: Once | INTRAVENOUS | Status: AC
  Administered 2021-06-04: 816 mg via INTRAVENOUS
  Filled 2021-06-04: qty 40.8

## 2021-06-04 MED ORDER — PALONOSETRON HCL INJECTION 0.25 MG/5ML
0.2500 mg | Freq: Once | INTRAVENOUS | Status: AC
  Administered 2021-06-04: 0.25 mg via INTRAVENOUS
  Filled 2021-06-04: qty 5

## 2021-06-04 MED ORDER — OXALIPLATIN CHEMO INJECTION 100 MG/20ML
85.0000 mg/m2 | Freq: Once | INTRAVENOUS | Status: AC
  Administered 2021-06-04: 175 mg via INTRAVENOUS
  Filled 2021-06-04: qty 35

## 2021-06-04 MED ORDER — LIDOCAINE-PRILOCAINE 2.5-2.5 % EX CREA: 1.0000 "application " | TOPICAL_CREAM | CUTANEOUS | 0 refills | Status: DC | PRN

## 2021-06-04 MED ORDER — SODIUM CHLORIDE 0.9 % IV SOLN
10.0000 mg | Freq: Once | INTRAVENOUS | Status: AC
  Administered 2021-06-04: 10 mg via INTRAVENOUS
  Filled 2021-06-04: qty 10

## 2021-06-04 MED ORDER — DEXTROSE 5 % IV SOLN: Freq: Once | INTRAVENOUS | Status: AC

## 2021-06-04 MED ORDER — PROCHLORPERAZINE MALEATE 10 MG PO TABS: 10.0000 mg | ORAL_TABLET | Freq: Four times a day (QID) | ORAL | 2 refills | Status: DC | PRN

## 2021-06-04 MED ORDER — SODIUM CHLORIDE 0.9 % IV SOLN
2400.0000 mg/m2 | INTRAVENOUS | Status: DC
  Administered 2021-06-04: 4900 mg via INTRAVENOUS
  Filled 2021-06-04: qty 98

## 2021-06-04 NOTE — Patient Instructions (Addendum)
[facility]   Discharge Instructions: Thank you for choosing [facility] to provide your oncology and hematology care.   If you have a lab appointment with the [facility], please go directly to the [facility] and check in at the registration area.   Wear comfortable clothing and clothing appropriate for easy access to any Portacath or PICC line.   We strive to give you quality time with your provider. You may need to reschedule your appointment if you arrive late (15 or more minutes).  Arriving late affects you and other patients whose appointments are after yours.  Also, if you miss three or more appointments without notifying the office, you may be dismissed from the clinic at the provider's discretion.      For prescription refill requests, have your pharmacy contact our office and allow 72 hours for refills to be completed.    Today you received the following chemotherapy and/or immunotherapy agents: Folfox (Eloxatin, Leucovorin, Adrucil).      To help prevent nausea and vomiting after your treatment, we encourage you to take your nausea medication as directed.  BELOW ARE SYMPTOMS THAT SHOULD BE REPORTED IMMEDIATELY: *FEVER GREATER THAN 100.4 F (38 C) OR HIGHER *CHILLS OR SWEATING *NAUSEA AND VOMITING THAT IS NOT CONTROLLED WITH YOUR NAUSEA MEDICATION *UNUSUAL SHORTNESS OF BREATH *UNUSUAL BRUISING OR BLEEDING *URINARY PROBLEMS (pain or burning when urinating, or frequent urination) *BOWEL PROBLEMS (unusual diarrhea, constipation, pain near the anus) TENDERNESS IN MOUTH AND THROAT WITH OR WITHOUT PRESENCE OF ULCERS (sore throat, sores in mouth, or a toothache) UNUSUAL RASH, SWELLING OR PAIN  UNUSUAL VAGINAL DISCHARGE OR ITCHING   Items with * indicate a potential emergency and should be followed up as soon as possible or go to the Emergency Department if any problems should occur.  The chemotherapy medication bag should finish at 46  hours, 96 hours, or 7 days. For example, if your pump is scheduled for 46 hours and it was put on at 4:00 p.m., it should finish at 2:00 p.m. the day it is scheduled to come off regardless of your appointment time.     Estimated time to finish at 2:45 pm.   If the display on your pump reads "Low Volume" and it is beeping, take the batteries out of the pump and come to the cancer center for it to be taken off.   If the pump alarms go off prior to the pump reading "Low Volume" then call [phone] and someone can assist you.  If the plunger comes out and the chemotherapy medication is leaking out, please use your home chemo spill kit to clean up the spill. Do NOT use paper towels or other household products.  If you have problems or questions regarding your pump, please call either [phone] (24 hours a day) or the cancer center Monday-Friday 8:00 a.m.- 4:30 p.m. at the clinic number and we will assist you. If you are unable to get assistance, then go to the nearest Emergency Department and ask the staff to contact the IV team for assistance.

## 2021-06-04 NOTE — Progress Notes (Signed)
[name]   Telephone:(336) [phone] Fax:(336) [phone]   Clinic Follow up Note   Patient Care Team: Pcp, No as PCP - General  Date of Service:  06/04/2021  CHIEF COMPLAINT: f/u of peritoneal carcinomatosis  CURRENT THERAPY:  First line FOLFOX every 2 weeks, started 05/04/21  ASSESSMENT & PLAN:  Brenda Stewart is a 70 y.o. female with   1. 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 CT AP 04/21/21 showed findings suspicious for diffuse peritoneal carcinomatosis. She was admitted and underwent paracentesis on 04/24/21 showing atypical cells. -Diagnostic laparoscopy on 04/30/21 with pathology from peritoneum biopsy showing metastatic carcinoma with signet ring cell features. IHC supports upper GI primary.  -Her EGD was negative for primary malignancy. -I started her on FOLFOX on 05/04/21. She has tolerated relatively well. -she is not a candidate for first-line immunotherapy due to negative PD-L1 expression and normal MMR expression -Labs reviewed, overall adequate to continue treatment. She will proceed with C3 FOLFOX today. -Is clinically doing well, eating well orally, and to stop TPN in 2 weeks. -I called in compazine and emla cream today.   2. Small bowel obstruction, secondary to peritoneal carcinomatosis, Protein calorie malnutrition -she was started on TPN in the hospital, will continue  -continues on soft diet, supplementing with Boost.  -OK to hold TPN during her cycle 3 FOLFOX  -f/u with dietician  -I called in compazine.   3. Mild Anemia -found to have anemia while in the hospital -stable, hgb at 8.4 today (06/04/21)     PLAN: -I called in emla cream and compazine today -proceed with C3 FOLFOX today at full dose without 5-fu bolus    No problem-specific Assessment & Plan notes  found for this encounter.   SUMMARY OF ONCOLOGIC HISTORY: Oncology History  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[facility])  04/21/2021 Imaging   CT AP w/o contrast  IMPRESSION: Findings highly suspicious for diffuse peritoneal carcinomatosis.   Suspected distal small bowel obstruction, with probable transition point in right lower quadrant.   Aortic Atherosclerosis (ICD10-I70.0).   04/22/2021 Imaging   US Abdomen  IMPRESSION: Small volume of lower abdominopelvic ascites.   04/24/2021 Pathology Results   Cytology  Specimen Submitted:  A. ASCITES, PARACENTESIS:   FINAL MICROSCOPIC DIAGNOSIS:  - Atypical cells present  DIAGNOSTIC COMMENTS:  Immunohistochemical stains show that the rare, mildly atypical cells are positive for calretinin and D2-40; while they are negative for PAX8, ER and MOC-31.  This immunoprofile is consistent with reactive mesothelial cells.    04/25/2021 Initial Diagnosis   Primary peritoneal carcinomatosis ([facility])   04/29/2021 Imaging   CT AP  IMPRESSION: 1. Moderate ascites, mild peritoneal thickening and diffuse omental thickening/caking and smaller focal fluid/cystic areas within the RIGHT abdomen and LOWER pelvis, compatible with peritoneal carcinomatosis. Mild mesenteric stranding is nonspecific. 2. Small subserosal implants along the INFERIOR RIGHT liver. 3. Small LEFT pleural effusion and trace RIGHT pleural effusion with LEFT LOWER lung atelectasis. 4. Aortic Atherosclerosis (ICD10-I70.0).   04/30/2021 Pathology Results   FINAL MICROSCOPIC DIAGNOSIS:   A. 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 PERTIONEUM, ANTERIOR, EXCISION:  -  Metastatic carcinoma with signet ring cell features  -  See comment   COMMENT:   By immunohistochemistry (performed on blocks A2  and B1), the neoplastic cells are positive for cytokeratin AE1/3, cytokeratin 20 and CDX2 but negative for TTF-1, ER, GATA3, PAX 8 and  cytokeratin 7.  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   ADDENDUM:  By immunohistochemistry, HER-2 is EQUIVOCAL (2+).    FLOURESCENCE IN-SITU HYBRIDIZATION RESULTS:  GROUP 5:   HER2 **NEGATIVE**    05/04/2021 -  Chemotherapy    Patient is on Treatment Plan: GASTRIC FOLFOX Q14D X 12 CYCLES       05/04/2021 Imaging   CT Chest  IMPRESSION: 1. Suspected segmental filling defects in the anterior segment right upper lobe and apical segment right upper lobe compatible with small pulmonary emboli. Clot burden appears small, although please note that today's examination was not a dedicated CT angiogram and as such might overestimate or underestimate pulmonary embolus. Positive for acute PE with CT evidence of right heart strain (RV/LV Ratio = 1.1) consistent with at least submassive (intermediate risk) PE. The presence of right heart strain has been associated with an increased risk of morbidity and mortality. Please refer to the "PE Focused" order set in EPIC. 2. No compelling findings of metastatic disease to the chest. There is a small left pleural effusion, but no definite enhancement along the pleura to suggest malignant or exudative etiology at this time. 3. There is some new gas in the soft tissues of the left thoracoabdominal wall, compatible subcutaneous emphysema, probably related to recent laparoscopy or other procedure. 4. Hypodense thyroid nodules up to 2.1 cm in diameter. Recommend thyroid US (ref: J Am Coll Radiol. 2015 Feb;12(2): 143-50). 5. Left upper quadrant ascites, including fluid in the lesser sac. This is reduced compared to the prior CT abdomen of 04/29/2021. 6. Stable appearance of periportal edema and hypodensities along the posterior margin of the right hepatic lobe.   05/07/2021 Pathology Results   FINAL MICROSCOPIC DIAGNOSIS:   A. DUODENUM, BULB, BIOPSY:  - Polypoid duodenal mucosa with  chronic inflammation, foveolar  metaplasia and reactive changes  - Negative for dysplasia or malignancy   Cytology: Specimen Submitted:  A. ESOPHAGUS, BRUSHING:   FINAL MICROSCOPIC DIAGNOSIS:  - Benign reactive/reparative changes    05/16/2021 Cancer Staging   Staging form: Soft Tissue Sarcoma of the Abdomen and Thoracic Visceral Organs, AJCC 8th Edition - Clinical: cTX, cN0, pM1 - Signed by [name], MD on 05/16/2021      INTERVAL HISTORY:  Brenda Stewart is here for a follow up of peritoneal carcinomatosis. She was last seen by me on 05/21/21. She was seen in the infusion area. She reports she is doing better. She is eating a soft diet. She has gained weight in the last 2 weeks. "I'm enjoying eating." She notes she is scheduled for annual f/u [facility] tomorrow. She is unsure if she should go. I advised her to reschedule if she is not feeling well from chemo.   All other systems were reviewed with the patient and are negative.  MEDICAL HISTORY:  Past Medical History:  Diagnosis Date   Acid reflux    Hypertension     SURGICAL HISTORY: Past Surgical History:  Procedure Laterality Date   BIOPSY  05/07/2021   Procedure: BIOPSY;  Surgeon: [name], MD;  Location: [facility];  Service: Endoscopy;;   BUNIONECTOMY Bilateral    ESOPHAGEAL BRUSHING  05/07/2021   Procedure: ESOPHAGEAL BRUSHING;  Surgeon: [name], MD;  Location: [facility];  Service: Endoscopy;;  ESOPHAGOGASTRODUODENOSCOPY (EGD) WITH PROPOFOL N/A 05/07/2021   Procedure: ESOPHAGOGASTRODUODENOSCOPY (EGD) WITH PROPOFOL;  Surgeon: [name], MD;  Location: [facility];  Service: Endoscopy;  Laterality: N/A;   HAND TENDON SURGERY Bilateral    IR IMAGING GUIDED PORT INSERTION  04/26/2021   IR PARACENTESIS  04/24/2021   LAPAROSCOPY N/A 04/30/2021   Procedure: LAPAROSCOPY DIAGNOSTIC WITH BIOPSIES;  Surgeon: Lafonda Mosses, MD;  Location: [facility];  Service: Gynecology;  Laterality: N/A;   PARTIAL  HYSTERECTOMY      I have reviewed the social history and family history with the patient and they are unchanged from previous note.  ALLERGIES:  has No Known Allergies.  MEDICATIONS:  Current Outpatient Medications  Medication Sig Dispense Refill   lidocaine-prilocaine (EMLA) cream Apply 1 application topically as needed. 30 g 0   prochlorperazine (COMPAZINE) 10 MG tablet Take 1 tablet (10 mg total) by mouth every 6 (six) hours as needed for nausea or vomiting. 30 tablet 2   APIXABAN (ELIQUIS) VTE STARTER PACK (10MG AND 5MG) Take as directed on package: start with two-35m tablets twice daily for 7 days. On day 8, switch to one-556mtablet twice daily. 1 each 0   ascorbic acid (VITAMIN C) 250 MG CHEW Chew 500 mg by mouth daily.     aspirin EC 81 MG tablet Take 81 mg by mouth daily. Swallow whole.     Cholecalciferol (VITAMIN D3) 50 MCG (2000 UT) CHEW Chew 2 tablets by mouth daily.     folic acid (FOLVITE) 1 MG tablet Take 1 tablet (1 mg total) by mouth daily. 30 tablet 3   omeprazole (PRILOSEC) 20 MG capsule Take 20 mg by mouth daily as needed (heartburn/acid reflux).     sennosides-docusate sodium (SENOKOT-S) 8.6-50 MG tablet Take 2 tablets by mouth daily as needed for constipation.     No current facility-administered medications for this visit.   Facility-Administered Medications Ordered in Other Visits  Medication Dose Route Frequency Provider Last Rate Last Admin   fluorouracil (ADRUCIL) 4,900 mg in sodium chloride 0.9 % 52 mL chemo infusion  2,400 mg/m2 (Treatment Plan Recorded) Intravenous 1 day or 1 dose FeTruitt MerleMD       leucovorin 816 mg in dextrose 5 % 250 mL infusion  400 mg/m2 (Treatment Plan Recorded) Intravenous Once FeTruitt MerleMD 145 mL/hr at 06/04/21 1438 816 mg at 06/04/21 1438   oxaliplatin (ELOXATIN) 175 mg in dextrose 5 % 500 mL chemo infusion  85 mg/m2 (Treatment Plan Recorded) Intravenous Once FeTruitt MerleMD 268 mL/hr at 06/04/21 1436 175 mg at 06/04/21 1436     PHYSICAL EXAMINATION: ECOG PERFORMANCE STATUS: 1 - Symptomatic but completely ambulatory  There were no vitals filed for this visit. Wt Readings from Last 3 Encounters:  06/04/21 174 lb 4 oz (79 kg)  05/29/21 171 lb 6.4 oz (77.7 kg)  05/21/21 169 lb 12.8 oz (77 kg)     GENERAL:alert, no distress and comfortable SKIN: skin color normal, no rashes or significant lesions EYES: normal, Conjunctiva are pink and non-injected, sclera clear  NEURO: alert & oriented x 3 with fluent speech  LABORATORY DATA:  I have reviewed the data as listed CBC Latest Ref Rng & Units 06/04/2021 05/21/2021 05/16/2021  WBC 4.0 - 10.5 K/uL 4.9 6.0 4.3  Hemoglobin 12.0 - 15.0 g/dL 8.4(L) 8.9(L) 9.0(L)  Hematocrit 36.0 - 46.0 % 25.5(L) 26.9(L) 26.5(L)  Platelets 150 - 400 K/uL 153 367 310     CMP Latest Ref Rng & Units  06/04/2021 05/21/2021 05/16/2021  Glucose 70 - 99 mg/dL 123(H) 106(H) 115(H)  BUN 8 - 23 mg/dL _0 Creatinine 0.44 - 1.00 mg/dL 0.79 0.71 0.77  Sodium 135 - 145 mmol/L 139 138 136  Potassium 3.5 - 5.1 mmol/L 3.7 3.8 3.9  Chloride 98 - 111 mmol/L 105 103 102  CO2 22 - 32 mmol/L _1 Calcium 8.9 - 10.3 mg/dL 9.2 8.6(L) 8.4(L)  Total Protein 6.5 - 8.1 g/dL 7.4 7.3 7.2  Total Bilirubin 0.3 - 1.2 mg/dL 0.4 0.3 0.2(L)  Alkaline Phos 38 - 126 U/L 85 74 65  AST 15 - 41 U/L _2 ALT 0 - 44 U/L _3 RADIOGRAPHIC STUDIES: I have personally reviewed the radiological images as listed and agreed with the findings in the report. No results found.    No orders of the defined types were placed in this encounter.  All questions were answered. The patient knows to call the clinic with any problems, questions or concerns. No barriers to learning was detected. The total time spent in the appointment was 30 minutes.     [name], MD 06/04/2021   I, Wilburn Mylar, am acting as scribe for [name], MD.   I have reviewed the above documentation for accuracy and  completeness, and I agree with the above.

## 2021-06-05 ENCOUNTER — Telehealth: Payer: Self-pay | Admitting: Hematology

## 2021-06-05 NOTE — Telephone Encounter (Signed)
Left message with follow-up appointments per 9/12 los.

## 2021-06-06 ENCOUNTER — Inpatient Hospital Stay: Payer: Medicare Other

## 2021-06-06 ENCOUNTER — Other Ambulatory Visit: Payer: Self-pay

## 2021-06-06 VITALS — BP 115/74 | HR 93 | Temp 98.9°F | Resp 16

## 2021-06-06 DIAGNOSIS — Z95828 Presence of other vascular implants and grafts: Secondary | ICD-10-CM

## 2021-06-06 DIAGNOSIS — Z5111 Encounter for antineoplastic chemotherapy: Secondary | ICD-10-CM | POA: Diagnosis not present

## 2021-06-06 MED ORDER — HEPARIN SOD (PORK) LOCK FLUSH 100 UNIT/ML IV SOLN
500.0000 [IU] | Freq: Once | INTRAVENOUS | Status: AC
  Administered 2021-06-06: 500 [IU]

## 2021-06-06 MED ORDER — SODIUM CHLORIDE 0.9% FLUSH
10.0000 mL | Freq: Once | INTRAVENOUS | Status: AC
  Administered 2021-06-06: 10 mL

## 2021-06-15 MED FILL — Dexamethasone Sodium Phosphate Inj 100 MG/10ML: INTRAMUSCULAR | Qty: 1 | Status: AC

## 2021-06-18 ENCOUNTER — Other Ambulatory Visit: Payer: Self-pay

## 2021-06-18 ENCOUNTER — Inpatient Hospital Stay: Payer: Medicare Other

## 2021-06-18 ENCOUNTER — Encounter: Payer: Self-pay | Admitting: Hematology

## 2021-06-18 ENCOUNTER — Inpatient Hospital Stay: Payer: Medicare Other | Admitting: Nutrition

## 2021-06-18 ENCOUNTER — Inpatient Hospital Stay (HOSPITAL_BASED_OUTPATIENT_CLINIC_OR_DEPARTMENT_OTHER): Payer: Medicare Other | Admitting: Hematology

## 2021-06-18 VITALS — Resp 20

## 2021-06-18 VITALS — BP 149/84 | HR 95 | Temp 98.4°F | Ht 67.0 in | Wt 183.4 lb

## 2021-06-18 DIAGNOSIS — C786 Secondary malignant neoplasm of retroperitoneum and peritoneum: Secondary | ICD-10-CM | POA: Diagnosis not present

## 2021-06-18 DIAGNOSIS — Z5111 Encounter for antineoplastic chemotherapy: Secondary | ICD-10-CM | POA: Diagnosis not present

## 2021-06-18 DIAGNOSIS — D490 Neoplasm of unspecified behavior of digestive system: Secondary | ICD-10-CM

## 2021-06-18 DIAGNOSIS — C482 Malignant neoplasm of peritoneum, unspecified: Secondary | ICD-10-CM

## 2021-06-18 DIAGNOSIS — Z95828 Presence of other vascular implants and grafts: Secondary | ICD-10-CM

## 2021-06-18 LAB — CBC WITH DIFFERENTIAL (CANCER CENTER ONLY)
Abs Immature Granulocytes: 0.24 10*3/uL — ABNORMAL HIGH (ref 0.00–0.07)
Basophils Absolute: 0.1 10*3/uL (ref 0.0–0.1)
Basophils Relative: 1 %
Eosinophils Absolute: 0 10*3/uL (ref 0.0–0.5)
Eosinophils Relative: 1 %
HCT: 24.7 % — ABNORMAL LOW (ref 36.0–46.0)
Hemoglobin: 8.3 g/dL — ABNORMAL LOW (ref 12.0–15.0)
Immature Granulocytes: 4 %
Lymphocytes Relative: 23 %
Lymphs Abs: 1.4 10*3/uL (ref 0.7–4.0)
MCH: 30.1 pg (ref 26.0–34.0)
MCHC: 33.6 g/dL (ref 30.0–36.0)
MCV: 89.5 fL (ref 80.0–100.0)
Monocytes Absolute: 1 10*3/uL (ref 0.1–1.0)
Monocytes Relative: 16 %
Neutro Abs: 3.3 10*3/uL (ref 1.7–7.7)
Neutrophils Relative %: 55 %
Platelet Count: 164 10*3/uL (ref 150–400)
RBC: 2.76 MIL/uL — ABNORMAL LOW (ref 3.87–5.11)
RDW: 17.2 % — ABNORMAL HIGH (ref 11.5–15.5)
WBC Count: 6 10*3/uL (ref 4.0–10.5)
nRBC: 0 % (ref 0.0–0.2)

## 2021-06-18 LAB — CMP (CANCER CENTER ONLY)
ALT: 8 U/L (ref 0–44)
AST: 20 U/L (ref 15–41)
Albumin: 2.7 g/dL — ABNORMAL LOW (ref 3.5–5.0)
Alkaline Phosphatase: 82 U/L (ref 38–126)
Anion gap: 9 (ref 5–15)
BUN: 24 mg/dL — ABNORMAL HIGH (ref 8–23)
CO2: 24 mmol/L (ref 22–32)
Calcium: 9.3 mg/dL (ref 8.9–10.3)
Chloride: 103 mmol/L (ref 98–111)
Creatinine: 0.78 mg/dL (ref 0.44–1.00)
GFR, Estimated: >60 mL/min (ref >60)
Glucose, Bld: 112 mg/dL — ABNORMAL HIGH (ref 70–99)
Potassium: 4.1 mmol/L (ref 3.5–5.1)
Sodium: 136 mmol/L (ref 135–145)
Total Bilirubin: 0.6 mg/dL (ref 0.3–1.2)
Total Protein: 7.7 g/dL (ref 6.5–8.1)

## 2021-06-18 MED ORDER — FLUOROURACIL CHEMO INJECTION 5 GM/100ML
2450.0000 mg/m2 | INTRAVENOUS | Status: DC
  Administered 2021-06-18: 5000 mg via INTRAVENOUS
  Filled 2021-06-18: qty 100

## 2021-06-18 MED ORDER — SODIUM CHLORIDE 0.9 % IV SOLN
10.0000 mg | Freq: Once | INTRAVENOUS | Status: AC
  Administered 2021-06-18: 10 mg via INTRAVENOUS
  Filled 2021-06-18: qty 10

## 2021-06-18 MED ORDER — LEUCOVORIN CALCIUM INJECTION 350 MG
400.0000 mg/m2 | Freq: Once | INTRAVENOUS | Status: AC
  Administered 2021-06-18: 816 mg via INTRAVENOUS
  Filled 2021-06-18: qty 40.8

## 2021-06-18 MED ORDER — OXALIPLATIN CHEMO INJECTION 100 MG/20ML
85.0000 mg/m2 | Freq: Once | INTRAVENOUS | Status: AC
  Administered 2021-06-18: 175 mg via INTRAVENOUS
  Filled 2021-06-18: qty 35

## 2021-06-18 MED ORDER — SODIUM CHLORIDE 0.9% FLUSH
10.0000 mL | Freq: Once | INTRAVENOUS | Status: AC
  Administered 2021-06-18: 10 mL

## 2021-06-18 MED ORDER — HEPARIN SOD (PORK) LOCK FLUSH 100 UNIT/ML IV SOLN
500.0000 [IU] | Freq: Once | INTRAVENOUS | Status: DC | PRN
Start: 2021-06-18 — End: 2021-06-18

## 2021-06-18 MED ORDER — DEXTROSE 5 % IV SOLN: Freq: Once | INTRAVENOUS | Status: AC

## 2021-06-18 MED ORDER — PALONOSETRON HCL INJECTION 0.25 MG/5ML
0.2500 mg | Freq: Once | INTRAVENOUS | Status: AC
Start: 2021-06-18 — End: 2021-06-18
  Administered 2021-06-18: 0.25 mg via INTRAVENOUS
  Filled 2021-06-18: qty 5

## 2021-06-18 MED ORDER — SODIUM CHLORIDE 0.9% FLUSH
10.0000 mL | INTRAVENOUS | Status: DC | PRN
Start: 2021-06-18 — End: 2021-06-18

## 2021-06-18 NOTE — Progress Notes (Signed)
Nutrition follow-up completed with patient during infusion for peritoneal carcinomatosis.  Weight improved and was documented as 183.4 pounds September 26 this is increased from 171.4 pounds on September 6. Noted labs: Glucose 112, BUN 24, albumin 2.7. Patient states she still continues TPN overnight. Patient is eating a soft diet with no complaints. She has a bowel movement at least once to every other day. She denies nausea and vomiting. She reports MD has questioned weaning TPN.  Nutrition diagnosis: Unintended weight loss resolved. Severe malnutrition improving.  Intervention: Would recommend pharmacy to wean TPN as patient is able to tolerate a soft diet.  I have encouraged patient to also communicate with her daughter. Continue soft diet as tolerated. Monitor labs and nutrition impact symptoms.  Monitoring, evaluation, goals: Patient will continue to tolerate adequate calories and protein for weight maintenance.  No follow-up scheduled.  Patient can contact RD with further questions or concerns.  **Disclaimer: This note was dictated with voice recognition software. Similar sounding words can inadvertently be transcribed and this note may contain transcription errors which may not have been corrected upon publication of note.**

## 2021-06-18 NOTE — Patient Instructions (Signed)
[facility]   Discharge Instructions: Thank you for choosing [facility] to provide your oncology and hematology care.   If you have a lab appointment with the [facility], please go directly to the [facility] and check in at the registration area.   Wear comfortable clothing and clothing appropriate for easy access to any Portacath or PICC line.   We strive to give you quality time with your provider. You may need to reschedule your appointment if you arrive late (15 or more minutes).  Arriving late affects you and other patients whose appointments are after yours.  Also, if you miss three or more appointments without notifying the office, you may be dismissed from the clinic at the provider's discretion.      For prescription refill requests, have your pharmacy contact our office and allow 72 hours for refills to be completed.    Today you received the following chemotherapy and/or immunotherapy agents: Folfox (Eloxatin, Leucovorin, Adrucil).      To help prevent nausea and vomiting after your treatment, we encourage you to take your nausea medication as directed.  BELOW ARE SYMPTOMS THAT SHOULD BE REPORTED IMMEDIATELY: *FEVER GREATER THAN 100.4 F (38 C) OR HIGHER *CHILLS OR SWEATING *NAUSEA AND VOMITING THAT IS NOT CONTROLLED WITH YOUR NAUSEA MEDICATION *UNUSUAL SHORTNESS OF BREATH *UNUSUAL BRUISING OR BLEEDING *URINARY PROBLEMS (pain or burning when urinating, or frequent urination) *BOWEL PROBLEMS (unusual diarrhea, constipation, pain near the anus) TENDERNESS IN MOUTH AND THROAT WITH OR WITHOUT PRESENCE OF ULCERS (sore throat, sores in mouth, or a toothache) UNUSUAL RASH, SWELLING OR PAIN  UNUSUAL VAGINAL DISCHARGE OR ITCHING   Items with * indicate a potential emergency and should be followed up as soon as possible or go to the Emergency Department if any problems should occur.  The chemotherapy medication bag should finish at 46  hours, 96 hours, or 7 days. For example, if your pump is scheduled for 46 hours and it was put on at 4:00 p.m., it should finish at 2:00 p.m. the day it is scheduled to come off regardless of your appointment time.     Estimated time to finish at 2:45 pm.   If the display on your pump reads "Low Volume" and it is beeping, take the batteries out of the pump and come to the cancer center for it to be taken off.   If the pump alarms go off prior to the pump reading "Low Volume" then call [phone] and someone can assist you.  If the plunger comes out and the chemotherapy medication is leaking out, please use your home chemo spill kit to clean up the spill. Do NOT use paper towels or other household products.  If you have problems or questions regarding your pump, please call either [phone] (24 hours a day) or the cancer center Monday-Friday 8:00 a.m.- 4:30 p.m. at the clinic number and we will assist you. If you are unable to get assistance, then go to the nearest Emergency Department and ask the staff to contact the IV team for assistance.

## 2021-06-18 NOTE — Progress Notes (Signed)
[facility]   [phone] [phone]   Clinic Follow up Note   Patient Care Team: Pcp, No as PCP - General  Date of Service:  06/18/2021  CHIEF COMPLAINT: f/u of peritoneal carcinomatosis  CURRENT THERAPY:  First line FOLFOX every 2 weeks, started 05/04/21  ASSESSMENT & PLAN:  Brenda Stewart is a 70 y.o. female with   1. 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 CT AP 04/21/21 showed findings suspicious for diffuse peritoneal carcinomatosis. She was admitted and underwent paracentesis on 04/24/21 showing atypical cells. -Diagnostic laparoscopy on 04/30/21 with pathology from peritoneum biopsy showing metastatic carcinoma with signet ring cell features. IHC supports upper GI primary.  -Her EGD was negative for primary malignancy. -her cancer is PD-L1 negative and MMR normal; she is not a candidate for first-line immunotherapy -I started her on FOLFOX on 05/04/21. She has tolerated relatively well. -Labs reviewed, overall adequate to continue treatment. She will proceed with C4 FOLFOX today. -Is clinically doing well, eating well orally, and will plan to stop TPN in next week (she will finish what she has at home) -I will order restaging scan at her next visit.   2. Small bowel obstruction, secondary to peritoneal carcinomatosis, Protein calorie malnutrition -she was started on TPN in the hospital, will continue  -continues on soft diet, supplementing with Boost.  -she will start to come off TPN, and we will monitor her weight. -f/u with dietician    3. Mild Anemia -found to have anemia while in the hospital -stable, hgb at 8.3 today (06/18/21)     PLAN: -proceed with C4 FOLFOX today at full dose (without 5-fu bolus)  -lab, f/u, and C5 as scheduled on 07/02/21. -she will  finish the TPN supply at home then stop within the next week and watch her weight    No problem-specific Assessment & Plan notes found for this encounter.   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   Suspected distal small bowel obstruction, with probable transition point in right lower quadrant.   Aortic Atherosclerosis (ICD10-I70.0).   04/22/2021 Imaging   US Abdomen  IMPRESSION: Small volume of lower abdominopelvic ascites.   04/24/2021 Pathology Results   Cytology  Specimen Submitted:  A. ASCITES, PARACENTESIS:   FINAL MICROSCOPIC DIAGNOSIS:  - Atypical cells present  DIAGNOSTIC COMMENTS:  Immunohistochemical stains show that the rare, mildly atypical cells are positive for calretinin and D2-40; while they are negative for PAX8, ER and MOC-31.  This immunoprofile is consistent with reactive mesothelial cells.    04/25/2021 Initial Diagnosis   Primary peritoneal carcinomatosis ([place])   04/29/2021 Imaging   CT AP  IMPRESSION: 1. Moderate ascites, mild peritoneal thickening and diffuse omental thickening/caking and smaller focal fluid/cystic areas within the RIGHT abdomen and LOWER pelvis, compatible with peritoneal carcinomatosis. Mild mesenteric stranding is nonspecific. 2. Small subserosal implants along the INFERIOR RIGHT liver. 3. Small LEFT pleural effusion and trace RIGHT pleural effusion with LEFT LOWER lung atelectasis. 4. Aortic Atherosclerosis (ICD10-I70.0).   04/30/2021 Pathology Results   FINAL MICROSCOPIC DIAGNOSIS:   A. 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 PERTIONEUM,  ANTERIOR, EXCISION:  -  Metastatic carcinoma with signet ring cell features  -  See comment   COMMENT:   By immunohistochemistry (performed on blocks A2 and B1),  the neoplastic cells are positive for cytokeratin AE1/3, cytokeratin 20 and CDX2 but negative for TTF-1, ER, GATA3, PAX 8 and cytokeratin 7.  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   ADDENDUM:  By immunohistochemistry, HER-2 is EQUIVOCAL (2+).    FLOURESCENCE IN-SITU HYBRIDIZATION RESULTS:  GROUP 5:   HER2 **NEGATIVE**    05/04/2021 -  Chemotherapy   Patient is on Treatment Plan : GASTRIC FOLFOX q14d x 12 cycles     05/04/2021 Imaging   CT Chest  IMPRESSION: 1. Suspected segmental filling defects in the anterior segment right upper lobe and apical segment right upper lobe compatible with small pulmonary emboli. Clot burden appears small, although please note that today's examination was not a dedicated CT angiogram and as such might overestimate or underestimate pulmonary embolus. Positive for acute PE with CT evidence of right heart strain (RV/LV Ratio = 1.1) consistent with at least submassive (intermediate risk) PE. The presence of right heart strain has been associated with an increased risk of morbidity and mortality. Please refer to the "PE Focused" order set in EPIC. 2. No compelling findings of metastatic disease to the chest. There is a small left pleural effusion, but no definite enhancement along the pleura to suggest malignant or exudative etiology at this time. 3. There is some new gas in the soft tissues of the left thoracoabdominal wall, compatible subcutaneous emphysema, probably related to recent laparoscopy or other procedure. 4. Hypodense thyroid nodules up to 2.1 cm in diameter. Recommend thyroid US (ref: J Am Coll Radiol. 2015 Feb;12(2): 143-50). 5. Left upper quadrant ascites, including fluid in the lesser sac. This is reduced compared to the prior CT abdomen of 04/29/2021. 6. Stable appearance of periportal edema and hypodensities along the posterior margin of the right hepatic lobe.   05/07/2021  Pathology Results   FINAL MICROSCOPIC DIAGNOSIS:   A. DUODENUM, BULB, BIOPSY:  - Polypoid duodenal mucosa with chronic inflammation, foveolar  metaplasia and reactive changes  - Negative for dysplasia or malignancy   Cytology: Specimen Submitted:  A. ESOPHAGUS, BRUSHING:   FINAL MICROSCOPIC DIAGNOSIS:  - Benign reactive/reparative changes    05/16/2021 Cancer Staging   Staging form: Soft Tissue Sarcoma of the Abdomen and Thoracic Visceral Organs, AJCC 8th Edition - Clinical: cTX, cN0, pM1 - Signed by [name], MD on 05/16/2021      INTERVAL HISTORY:  SUNDI SLEVIN is here for a follow up of peritoneal carcinomatosis. She was last seen by me on 06/04/21. She presents to the clinic alone. She reports doing well overall. She notes she is eating well now and eating a mostly soft diet. She reports a BM every 1-2 days. She denies pain. She notes she is able to do everything for herself. She reports her energy fluctuates. She denies SOB.   All other systems were reviewed with the patient and are negative.  MEDICAL HISTORY:  Past Medical History:  Diagnosis Date   Acid reflux    Hypertension     SURGICAL HISTORY: Past Surgical History:  Procedure Laterality Date   BIOPSY  05/07/2021   Procedure: BIOPSY;  Surgeon: [name], MD;  Location: [facility];  Service: Endoscopy;;   BUNIONECTOMY Bilateral    ESOPHAGEAL BRUSHING  05/07/2021   Procedure: ESOPHAGEAL BRUSHING;  Surgeon: [name], MD;  Location: [facility];  Service: Endoscopy;;   ESOPHAGOGASTRODUODENOSCOPY (EGD) WITH PROPOFOL N/A 05/07/2021   Procedure: ESOPHAGOGASTRODUODENOSCOPY (EGD) WITH PROPOFOL;  Surgeon: [name], MD;  Location: [facility];  Service: Endoscopy;  Laterality: N/A;   HAND TENDON SURGERY Bilateral    IR IMAGING GUIDED PORT INSERTION  04/26/2021   IR PARACENTESIS  04/24/2021   LAPAROSCOPY N/A 04/30/2021   Procedure: LAPAROSCOPY DIAGNOSTIC WITH BIOPSIES;  Surgeon: Lafonda Mosses, MD;  Location:  [facility];  Service: Gynecology;  Laterality: N/A;   PARTIAL HYSTERECTOMY      I have reviewed the social history and family history with the patient and they are unchanged from previous note.  ALLERGIES:  has No Known Allergies.  MEDICATIONS:  Current Outpatient Medications  Medication Sig Dispense Refill   APIXABAN (ELIQUIS) VTE STARTER PACK (10MG AND 5MG) Take as directed on package: start with two-33m tablets twice daily for 7 days. On day 8, switch to one-542mtablet twice daily. 1 each 0   ascorbic acid (VITAMIN C) 250 MG CHEW Chew 500 mg by mouth daily.     aspirin EC 81 MG tablet Take 81 mg by mouth daily. Swallow whole.     Cholecalciferol (VITAMIN D3) 50 MCG (2000 UT) CHEW Chew 2 tablets by mouth daily.     folic acid (FOLVITE) 1 MG tablet Take 1 tablet (1 mg total) by mouth daily. 30 tablet 3   lidocaine-prilocaine (EMLA) cream Apply 1 application topically as needed. 30 g 0   omeprazole (PRILOSEC) 20 MG capsule Take 20 mg by mouth daily as needed (heartburn/acid reflux).     prochlorperazine (COMPAZINE) 10 MG tablet Take 1 tablet (10 mg total) by mouth every 6 (six) hours as needed for nausea or vomiting. 30 tablet 2   sennosides-docusate sodium (SENOKOT-S) 8.6-50 MG tablet Take 2 tablets by mouth daily as needed for constipation.     No current facility-administered medications for this visit.    PHYSICAL EXAMINATION: ECOG PERFORMANCE STATUS: 1 - Symptomatic but completely ambulatory  Vitals:   06/18/21 1201  BP: (!) 149/84  Pulse: 95  Temp: 98.4 F (36.9 C)  SpO2: 100%   Wt Readings from Last 3 Encounters:  06/18/21 183 lb 6.4 oz (83.2 kg)  06/04/21 174 lb 4 oz (79 kg)  05/29/21 171 lb 6.4 oz (77.7 kg)     GENERAL:alert, no distress and comfortable SKIN: skin color normal, no rashes or significant lesions EYES: normal, Conjunctiva are pink and non-injected, sclera clear  NEURO: alert & oriented x 3 with fluent speech  LABORATORY DATA:  I have reviewed the data  as listed CBC Latest Ref Rng & Units 06/18/2021 06/04/2021 05/21/2021  WBC 4.0 - 10.5 K/uL 6.0 4.9 6.0  Hemoglobin 12.0 - 15.0 g/dL 8.3(L) 8.4(L) 8.9(L)  Hematocrit 36.0 - 46.0 % 24.7(L) 25.5(L) 26.9(L)  Platelets 150 - 400 K/uL 164 153 367     CMP Latest Ref Rng & Units 06/18/2021 06/04/2021 05/21/2021  Glucose 70 - 99 mg/dL 112(H) 123(H) 106(H)  BUN 8 - 23 mg/dL 24(H) 23 19  Creatinine 0.44 - 1.00 mg/dL 0.78 0.79 0.71  Sodium 135 - 145 mmol/L 136 139 138  Potassium 3.5 - 5.1 mmol/L 4.1 3.7 3.8  Chloride 98 - 111 mmol/L 103 105 103  CO2 22 - 32 mmol/L '24 25 25  ' Calcium 8.9 - 10.3 mg/dL 9.3 9.2 8.6(L)  Total Protein 6.5 - 8.1 g/dL 7.7 7.4 7.3  Total Bilirubin 0.3 - 1.2 mg/dL  0.6 0.4 0.3  Alkaline Phos 38 - 126 U/L 82 85 74  AST 15 - 41 U/L '20 19 21  ' ALT 0 - 44 U/L '8 9 11      ' RADIOGRAPHIC STUDIES: I have personally reviewed the radiological images as listed and agreed with the findings in the report. No results found.    No orders of the defined types were placed in this encounter.  All questions were answered. The patient knows to call the clinic with any problems, questions or concerns. No barriers to learning was detected. The total time spent in the appointment was 30 minutes.     [name], MD 06/18/2021   I, Wilburn Mylar, am acting as scribe for [name], MD.   I have reviewed the above documentation for accuracy and completeness, and I agree with the above.

## 2021-06-19 ENCOUNTER — Telehealth: Payer: Self-pay

## 2021-06-19 ENCOUNTER — Other Ambulatory Visit: Payer: Self-pay

## 2021-06-19 DIAGNOSIS — D75839 Thrombocytosis, unspecified: Secondary | ICD-10-CM

## 2021-06-19 MED ORDER — APIXABAN (ELIQUIS) VTE STARTER PACK (10MG AND 5MG): ORAL_TABLET | ORAL | 0 refills | Status: DC

## 2021-06-19 NOTE — Telephone Encounter (Signed)
This nurse received a message from [place] at [facility] stating that patients small bowel obstruction has improved.  Patient is eating close to a full regular diet.  They are requesting to know if the provider is in agreement with weaning patient off the TPN.  Maybe decrease to 50% for one week or maybe administration 3 times per week.  No further questions or concerns noted at this time.  Information forwarded to MD.

## 2021-06-20 ENCOUNTER — Inpatient Hospital Stay: Payer: Medicare Other

## 2021-06-20 ENCOUNTER — Encounter: Payer: Self-pay | Admitting: Hematology

## 2021-06-20 ENCOUNTER — Other Ambulatory Visit: Payer: Self-pay

## 2021-06-20 VITALS — BP 122/74 | HR 87 | Temp 98.6°F | Resp 18

## 2021-06-20 DIAGNOSIS — Z5111 Encounter for antineoplastic chemotherapy: Secondary | ICD-10-CM | POA: Diagnosis not present

## 2021-06-20 DIAGNOSIS — Z95828 Presence of other vascular implants and grafts: Secondary | ICD-10-CM

## 2021-06-20 MED ORDER — SODIUM CHLORIDE 0.9% FLUSH
10.0000 mL | Freq: Once | INTRAVENOUS | Status: AC
  Administered 2021-06-20: 10 mL

## 2021-06-20 MED ORDER — HEPARIN SOD (PORK) LOCK FLUSH 100 UNIT/ML IV SOLN
500.0000 [IU] | Freq: Once | INTRAVENOUS | Status: AC
  Administered 2021-06-20: 500 [IU]

## 2021-06-29 MED FILL — Dexamethasone Sodium Phosphate Inj 100 MG/10ML: INTRAMUSCULAR | Qty: 1 | Status: AC

## 2021-07-02 ENCOUNTER — Inpatient Hospital Stay: Payer: Medicare Other

## 2021-07-02 ENCOUNTER — Inpatient Hospital Stay (HOSPITAL_BASED_OUTPATIENT_CLINIC_OR_DEPARTMENT_OTHER): Payer: Medicare Other | Admitting: Hematology

## 2021-07-02 ENCOUNTER — Other Ambulatory Visit: Payer: Self-pay

## 2021-07-02 ENCOUNTER — Inpatient Hospital Stay: Payer: Medicare Other | Attending: Hematology

## 2021-07-02 ENCOUNTER — Other Ambulatory Visit: Payer: Self-pay | Admitting: Hematology

## 2021-07-02 ENCOUNTER — Encounter: Payer: Self-pay | Admitting: General Practice

## 2021-07-02 ENCOUNTER — Encounter: Payer: Self-pay | Admitting: Hematology

## 2021-07-02 VITALS — BP 142/86 | HR 88 | Temp 98.6°F | Resp 18 | Ht 67.0 in | Wt 180.5 lb

## 2021-07-02 DIAGNOSIS — D490 Neoplasm of unspecified behavior of digestive system: Secondary | ICD-10-CM

## 2021-07-02 DIAGNOSIS — K56609 Unspecified intestinal obstruction, unspecified as to partial versus complete obstruction: Secondary | ICD-10-CM | POA: Insufficient documentation

## 2021-07-02 DIAGNOSIS — E46 Unspecified protein-calorie malnutrition: Secondary | ICD-10-CM | POA: Insufficient documentation

## 2021-07-02 DIAGNOSIS — C482 Malignant neoplasm of peritoneum, unspecified: Secondary | ICD-10-CM

## 2021-07-02 DIAGNOSIS — C786 Secondary malignant neoplasm of retroperitoneum and peritoneum: Secondary | ICD-10-CM

## 2021-07-02 DIAGNOSIS — Z79899 Other long term (current) drug therapy: Secondary | ICD-10-CM | POA: Diagnosis not present

## 2021-07-02 DIAGNOSIS — Z95828 Presence of other vascular implants and grafts: Secondary | ICD-10-CM

## 2021-07-02 DIAGNOSIS — D649 Anemia, unspecified: Secondary | ICD-10-CM | POA: Insufficient documentation

## 2021-07-02 DIAGNOSIS — Z5111 Encounter for antineoplastic chemotherapy: Secondary | ICD-10-CM | POA: Insufficient documentation

## 2021-07-02 DIAGNOSIS — C801 Malignant (primary) neoplasm, unspecified: Secondary | ICD-10-CM | POA: Insufficient documentation

## 2021-07-02 LAB — CMP (CANCER CENTER ONLY)
ALT: 12 U/L (ref 0–44)
AST: 28 U/L (ref 15–41)
Albumin: 3.1 g/dL — ABNORMAL LOW (ref 3.5–5.0)
Alkaline Phosphatase: 92 U/L (ref 38–126)
Anion gap: 10 (ref 5–15)
BUN: 9 mg/dL (ref 8–23)
CO2: 25 mmol/L (ref 22–32)
Calcium: 9.3 mg/dL (ref 8.9–10.3)
Chloride: 105 mmol/L (ref 98–111)
Creatinine: 0.89 mg/dL (ref 0.44–1.00)
GFR, Estimated: >60 mL/min (ref >60)
Glucose, Bld: 111 mg/dL — ABNORMAL HIGH (ref 70–99)
Potassium: 3.4 mmol/L — ABNORMAL LOW (ref 3.5–5.1)
Sodium: 140 mmol/L (ref 135–145)
Total Bilirubin: 0.5 mg/dL (ref 0.3–1.2)
Total Protein: 7.6 g/dL (ref 6.5–8.1)

## 2021-07-02 LAB — CBC WITH DIFFERENTIAL (CANCER CENTER ONLY)
Abs Immature Granulocytes: 0.03 10*3/uL (ref 0.00–0.07)
Basophils Absolute: 0 10*3/uL (ref 0.0–0.1)
Basophils Relative: 1 %
Eosinophils Absolute: 0.1 10*3/uL (ref 0.0–0.5)
Eosinophils Relative: 2 %
HCT: 27.3 % — ABNORMAL LOW (ref 36.0–46.0)
Hemoglobin: 9 g/dL — ABNORMAL LOW (ref 12.0–15.0)
Immature Granulocytes: 1 %
Lymphocytes Relative: 37 %
Lymphs Abs: 1.5 10*3/uL (ref 0.7–4.0)
MCH: 30.9 pg (ref 26.0–34.0)
MCHC: 33 g/dL (ref 30.0–36.0)
MCV: 93.8 fL (ref 80.0–100.0)
Monocytes Absolute: 0.5 10*3/uL (ref 0.1–1.0)
Monocytes Relative: 12 %
Neutro Abs: 1.9 10*3/uL (ref 1.7–7.7)
Neutrophils Relative %: 47 %
Platelet Count: 115 10*3/uL — ABNORMAL LOW (ref 150–400)
RBC: 2.91 MIL/uL — ABNORMAL LOW (ref 3.87–5.11)
RDW: 18.9 % — ABNORMAL HIGH (ref 11.5–15.5)
WBC Count: 4 10*3/uL (ref 4.0–10.5)
nRBC: 0 % (ref 0.0–0.2)

## 2021-07-02 MED ORDER — SODIUM CHLORIDE 0.9 % IV SOLN
10.0000 mg | Freq: Once | INTRAVENOUS | Status: AC
  Administered 2021-07-02: 10 mg via INTRAVENOUS
  Filled 2021-07-02: qty 10

## 2021-07-02 MED ORDER — DEXTROSE 5 % IV SOLN: Freq: Once | INTRAVENOUS | Status: AC

## 2021-07-02 MED ORDER — SODIUM CHLORIDE 0.9 % IV SOLN
2450.0000 mg/m2 | INTRAVENOUS | Status: DC
  Administered 2021-07-02: 5000 mg via INTRAVENOUS
  Filled 2021-07-02: qty 100

## 2021-07-02 MED ORDER — OXALIPLATIN CHEMO INJECTION 100 MG/20ML
85.0000 mg/m2 | Freq: Once | INTRAVENOUS | Status: AC
  Administered 2021-07-02: 175 mg via INTRAVENOUS
  Filled 2021-07-02: qty 35

## 2021-07-02 MED ORDER — HEPARIN SOD (PORK) LOCK FLUSH 100 UNIT/ML IV SOLN: 500.0000 [IU] | Freq: Once | INTRAVENOUS | Status: DC

## 2021-07-02 MED ORDER — SODIUM CHLORIDE 0.9% FLUSH
10.0000 mL | Freq: Once | INTRAVENOUS | Status: AC
  Administered 2021-07-02: 10 mL

## 2021-07-02 MED ORDER — LEUCOVORIN CALCIUM INJECTION 350 MG
400.0000 mg/m2 | Freq: Once | INTRAVENOUS | Status: AC
  Administered 2021-07-02: 816 mg via INTRAVENOUS
  Filled 2021-07-02: qty 40.8

## 2021-07-02 MED ORDER — PALONOSETRON HCL INJECTION 0.25 MG/5ML
0.2500 mg | Freq: Once | INTRAVENOUS | Status: AC
  Administered 2021-07-02: 0.25 mg via INTRAVENOUS
  Filled 2021-07-02: qty 5

## 2021-07-02 NOTE — Patient Instructions (Addendum)
[facility] ONCOLOGY  Discharge Instructions: Thank you for choosing [facility] to provide your oncology and hematology care.   If you have a lab appointment with the [facility], please go directly to the [facility] and check in at the registration area.   Wear comfortable clothing and clothing appropriate for easy access to any Portacath or PICC line.   We strive to give you quality time with your provider. You may need to reschedule your appointment if you arrive late (15 or more minutes).  Arriving late affects you and other patients whose appointments are after yours.  Also, if you miss three or more appointments without notifying the office, you may be dismissed from the clinic at the provider's discretion.      For prescription refill requests, have your pharmacy contact our office and allow 72 hours for refills to be completed.    Today you received the following chemotherapy and/or immunotherapy agents: Oxaliplatin, Leucovorin, & Fluorouracil     To help prevent nausea and vomiting after your treatment, we encourage you to take your nausea medication as directed.  BELOW ARE SYMPTOMS THAT SHOULD BE REPORTED IMMEDIATELY: *FEVER GREATER THAN 100.4 F (38 C) OR HIGHER *CHILLS OR SWEATING *NAUSEA AND VOMITING THAT IS NOT CONTROLLED WITH YOUR NAUSEA MEDICATION *UNUSUAL SHORTNESS OF BREATH *UNUSUAL BRUISING OR BLEEDING *URINARY PROBLEMS (pain or burning when urinating, or frequent urination) *BOWEL PROBLEMS (unusual diarrhea, constipation, pain near the anus) TENDERNESS IN MOUTH AND THROAT WITH OR WITHOUT PRESENCE OF ULCERS (sore throat, sores in mouth, or a toothache) UNUSUAL RASH, SWELLING OR PAIN  UNUSUAL VAGINAL DISCHARGE OR ITCHING   Items with * indicate a potential emergency and should be followed up as soon as possible or go to the Emergency Department if any problems should occur.  Please show the CHEMOTHERAPY ALERT CARD or  IMMUNOTHERAPY ALERT CARD at check-in to the Emergency Department and triage nurse.  Should you have questions after your visit or need to cancel or reschedule your appointment, please contact [facility]  Dept: [phone]  and follow the prompts.  Office hours are 8:00 a.m. to 4:30 p.m. Monday - Friday. Please note that voicemails left after 4:00 p.m. may not be returned until the following business day.  We are closed weekends and major holidays. You have access to a nurse at all times for urgent questions. Please call the main number to the clinic Dept: [phone] and follow the prompts.   For any non-urgent questions, you may also contact your provider using MyChart. We now offer e-Visits for anyone 68 and older to request care online for non-urgent symptoms. For details visit [email or web address].   Also download the MyChart app! Go to the app store, search "MyChart", open the app, select [facility], and log in with your MyChart username and password.  Due to Covid, a mask is required upon entering the hospital/clinic. If you do not have a mask, one will be given to you upon arrival. For doctor visits, patients may have 1 support person aged 84 or older with them. For treatment visits, patients cannot have anyone with them due to current Covid guidelines and our immunocompromised population.  The chemotherapy medication bag should finish at 46 hours, 96 hours, or 7 days. For example, if your pump is scheduled for 46 hours and it was put on at 4:00 p.m., it should finish at 2:00 p.m. the day it is scheduled to come off regardless of  your appointment time.     Estimated time to finish at 2:00 PM   If the display on your pump reads "Low Volume" and it is beeping, take the batteries out of the pump and come to the cancer center for it to be taken off.   If the pump alarms go off prior to the pump reading "Low Volume" then call [phone] and someone can  assist you.  If the plunger comes out and the chemotherapy medication is leaking out, please use your home chemo spill kit to clean up the spill. Do NOT use paper towels or other household products.  If you have problems or questions regarding your pump, please call either [phone] (24 hours a day) or the cancer center Monday-Friday 8:00 a.m.- 4:30 p.m. at the clinic number and we will assist you. If you are unable to get assistance, then go to the nearest Emergency Department and ask the staff to contact the IV team for assistance.

## 2021-07-02 NOTE — Progress Notes (Signed)
Brenda Stewart   Telephone:(336) [phone] Fax:(336) [phone]   Clinic Follow up Note   Patient Care Team: Pcp, No as PCP - General  Date of Service:  07/02/2021  CHIEF COMPLAINT: f/u of peritoneal carcinomatosis  CURRENT THERAPY:  First line FOLFOX every 2 weeks, started 05/04/21  ASSESSMENT & PLAN:  Brenda Stewart is a 70 y.o. female with   1. 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 CT AP 04/21/21 showed findings suspicious for diffuse peritoneal carcinomatosis. She was admitted and underwent paracentesis on 04/24/21 showing atypical cells. -Diagnostic laparoscopy on 04/30/21 with pathology from peritoneum biopsy showing metastatic carcinoma with signet ring cell features. IHC supports upper GI primary.  -Her EGD was negative for primary malignancy. -her cancer is PD-L1 negative and MMR normal; she is not a candidate for first-line immunotherapy -I started her on FOLFOX on 05/04/21. She has tolerated very well overall  -Labs reviewed, overall adequate to continue treatment. She will proceed with C5 FOLFOX today. -will plan for restaging CT to be done after her next cycle.   2. Small bowel obstruction, secondary to peritoneal carcinomatosis, Protein calorie malnutrition -she was started on TPN in the hospital, will continue  -continues on soft diet, supplementing with Boost.  -she is now off TPN. She has lost only 3 lbs since her last visit. We will monitor. -f/u with dietician    3. Mild Anemia -found to have anemia while in the hospital -stable, hgb at 9 today (07/02/21)     PLAN: -proceed with C5 FOLFOX today at full dose (without 5-fu bolus)  -lab, f/u, and C6 as scheduled on 07/16/21.   No problem-specific Assessment & Plan notes found for this encounter.   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   Suspected distal small bowel obstruction, with probable transition point in right lower quadrant.   Aortic Atherosclerosis (ICD10-I70.0).   04/22/2021 Imaging   US Abdomen  IMPRESSION: Small volume of lower abdominopelvic ascites.   04/24/2021 Pathology Results   Cytology  Specimen Submitted:  A. ASCITES, PARACENTESIS:   FINAL MICROSCOPIC DIAGNOSIS:  - Atypical cells present  DIAGNOSTIC COMMENTS:  Immunohistochemical stains show that the rare, mildly atypical cells are positive for calretinin and D2-40; while they are negative for PAX8, ER and MOC-31.  This immunoprofile is consistent with reactive mesothelial cells.    04/25/2021 Initial Diagnosis   Primary peritoneal carcinomatosis ([place])   04/29/2021 Imaging   CT AP  IMPRESSION: 1. Moderate ascites, mild peritoneal thickening and diffuse omental thickening/caking and smaller focal fluid/cystic areas within the RIGHT abdomen and LOWER pelvis, compatible with peritoneal carcinomatosis. Mild mesenteric stranding is nonspecific. 2. Small subserosal implants along the INFERIOR RIGHT liver. 3. Small LEFT pleural effusion and trace RIGHT pleural effusion with LEFT LOWER lung atelectasis. 4. Aortic Atherosclerosis (ICD10-I70.0).   04/30/2021 Pathology Results   FINAL MICROSCOPIC DIAGNOSIS:   A. 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 PERTIONEUM, ANTERIOR, EXCISION:  -  Metastatic carcinoma with signet ring cell features  -  See comment   COMMENT:   By immunohistochemistry (performed on blocks A2 and B1), the neoplastic cells  are positive for cytokeratin AE1/3, cytokeratin 20 and CDX2 but negative for TTF-1, ER, GATA3, PAX 8 and cytokeratin 7.  Overall, the findings are consistent with  a signet ring cell carcinoma and a gastrointestinal primary is favored.   ADDENDUM:  By immunohistochemistry, HER-2 is EQUIVOCAL (2+).    FLOURESCENCE IN-SITU HYBRIDIZATION RESULTS:  GROUP 5:   HER2 **NEGATIVE**    05/04/2021 -  Chemotherapy   Patient is on Treatment Plan : GASTRIC FOLFOX q14d x 12 cycles     05/04/2021 Imaging   CT Chest  IMPRESSION: 1. Suspected segmental filling defects in the anterior segment right upper lobe and apical segment right upper lobe compatible with small pulmonary emboli. Clot burden appears small, although please note that today's examination was not a dedicated CT angiogram and as such might overestimate or underestimate pulmonary embolus. Positive for acute PE with CT evidence of right heart strain (RV/LV Ratio = 1.1) consistent with at least submassive (intermediate risk) PE. The presence of right heart strain has been associated with an increased risk of morbidity and mortality. Please refer to the "PE Focused" order set in EPIC. 2. No compelling findings of metastatic disease to the chest. There is a small left pleural effusion, but no definite enhancement along the pleura to suggest malignant or exudative etiology at this time. 3. There is some new gas in the soft tissues of the left thoracoabdominal wall, compatible subcutaneous emphysema, probably related to recent laparoscopy or other procedure. 4. Hypodense thyroid nodules up to 2.1 cm in diameter. Recommend thyroid US (ref: J Am Coll Radiol. 2015 Feb;12(2): 143-50). 5. Left upper quadrant ascites, including fluid in the lesser sac. This is reduced compared to the prior CT abdomen of 04/29/2021. 6. Stable appearance of periportal edema and hypodensities along the posterior margin of the right hepatic lobe.   05/07/2021 Pathology Results   FINAL MICROSCOPIC DIAGNOSIS:   A. DUODENUM, BULB, BIOPSY:  - Polypoid duodenal mucosa with chronic inflammation, foveolar  metaplasia and reactive changes  -  Negative for dysplasia or malignancy   Cytology: Specimen Submitted:  A. ESOPHAGUS, BRUSHING:   FINAL MICROSCOPIC DIAGNOSIS:  - Benign reactive/reparative changes    05/16/2021 Cancer Staging   Staging form: Soft Tissue Sarcoma of the Abdomen and Thoracic Visceral Organs, AJCC 8th Edition - Clinical: cTX, cN0, pM1 - Signed by [name], MD on 05/16/2021      INTERVAL HISTORY:  Brenda Stewart is here for a follow up of peritoneal carcinomatosis. She was last seen by me on 06/18/21. She presents to the clinic alone. She reports she is doing well. She notes she is eating well. She denies any new neuropathy or any other issues.   All other systems were reviewed with the patient and are negative.  MEDICAL HISTORY:  Past Medical History:  Diagnosis Date   Acid reflux    Hypertension     SURGICAL HISTORY: Past Surgical History:  Procedure Laterality Date   BIOPSY  05/07/2021   Procedure: BIOPSY;  Surgeon: [name], MD;  Location: [facility];  Service: Endoscopy;;   BUNIONECTOMY Bilateral    ESOPHAGEAL BRUSHING  05/07/2021   Procedure: ESOPHAGEAL BRUSHING;  Surgeon: [name], MD;  Location: [facility];  Service: Endoscopy;;   ESOPHAGOGASTRODUODENOSCOPY (EGD) WITH PROPOFOL N/A 05/07/2021   Procedure: ESOPHAGOGASTRODUODENOSCOPY (EGD) WITH PROPOFOL;  Surgeon: [name], MD;  Location: [facility];  Service: Endoscopy;  Laterality: N/A;   HAND TENDON SURGERY Bilateral    IR IMAGING GUIDED PORT INSERTION  04/26/2021  IR PARACENTESIS  04/24/2021   LAPAROSCOPY N/A 04/30/2021   Procedure: LAPAROSCOPY DIAGNOSTIC WITH BIOPSIES;  Surgeon: Lafonda Mosses, MD;  Location: [facility];  Service: Gynecology;  Laterality: N/A;   PARTIAL HYSTERECTOMY      I have reviewed the social history and family history with the patient and they are unchanged from previous note.  ALLERGIES:  has No Known Allergies.  MEDICATIONS:  Current Outpatient Medications  Medication Sig Dispense Refill    APIXABAN (ELIQUIS) VTE STARTER PACK (10MG AND 5MG) Take as directed on package: start with two-83m tablets twice daily for 7 days. On day 8, switch to one-512mtablet twice daily. 1 each 0   ascorbic acid (VITAMIN C) 250 MG CHEW Chew 500 mg by mouth daily.     Cholecalciferol (VITAMIN D3) 50 MCG (2000 UT) CHEW Chew 2 tablets by mouth daily.     folic acid (FOLVITE) 1 MG tablet Take 1 tablet (1 mg total) by mouth daily. 30 tablet 3   lidocaine-prilocaine (EMLA) cream Apply 1 application topically as needed. 30 g 0   omeprazole (PRILOSEC) 20 MG capsule Take 20 mg by mouth daily as needed (heartburn/acid reflux).     prochlorperazine (COMPAZINE) 10 MG tablet Take 1 tablet (10 mg total) by mouth every 6 (six) hours as needed for nausea or vomiting. 30 tablet 2   sennosides-docusate sodium (SENOKOT-S) 8.6-50 MG tablet Take 2 tablets by mouth daily as needed for constipation.     No current facility-administered medications for this visit.   Facility-Administered Medications Ordered in Other Visits  Medication Dose Route Frequency Provider Last Rate Last Admin   fluorouracil (ADRUCIL) 5,000 mg in sodium chloride 0.9 % 150 mL chemo infusion  2,450 mg/m2 (Treatment Plan Recorded) Intravenous 1 day or 1 dose FeTruitt MerleMD       leucovorin 816 mg in dextrose 5 % 250 mL infusion  400 mg/m2 (Treatment Plan Recorded) Intravenous Once FeTruitt MerleMD       oxaliplatin (ELOXATIN) 175 mg in dextrose 5 % 500 mL chemo infusion  85 mg/m2 (Treatment Plan Recorded) Intravenous Once FeTruitt MerleMD        PHYSICAL EXAMINATION: ECOG PERFORMANCE STATUS: 1 - Symptomatic but completely ambulatory  Vitals:   07/02/21 1212  BP: (!) 142/86  Pulse: 88  Resp: 18  Temp: 98.6 F (37 C)  SpO2: 100%   Wt Readings from Last 3 Encounters:  07/02/21 180 lb 8 oz (81.9 kg)  06/18/21 183 lb 6.4 oz (83.2 kg)  06/04/21 174 lb 4 oz (79 kg)     GENERAL:alert, no distress and comfortable SKIN: skin color normal, no rashes or  significant lesions EYES: normal, Conjunctiva are pink and non-injected, sclera clear  NEURO: alert & oriented x 3 with fluent speech  LABORATORY DATA:  I have reviewed the data as listed CBC Latest Ref Rng & Units 07/02/2021 06/18/2021 06/04/2021  WBC 4.0 - 10.5 K/uL 4.0 6.0 4.9  Hemoglobin 12.0 - 15.0 g/dL 9.0(L) 8.3(L) 8.4(L)  Hematocrit 36.0 - 46.0 % 27.3(L) 24.7(L) 25.5(L)  Platelets 150 - 400 K/uL 115(L) 164 153     CMP Latest Ref Rng & Units 07/02/2021 06/18/2021 06/04/2021  Glucose 70 - 99 mg/dL 111(H) 112(H) 123(H)  BUN 8 - 23 mg/dL 9 24(H) 23  Creatinine 0.44 - 1.00 mg/dL 0.89 0.78 0.79  Sodium 135 - 145 mmol/L 140 136 139  Potassium 3.5 - 5.1 mmol/L 3.4(L) 4.1 3.7  Chloride 98 - 111 mmol/L 105 103 105  CO2 22 - 32 mmol/L '25 24 25  ' Calcium 8.9 - 10.3 mg/dL 9.3 9.3 9.2  Total Protein 6.5 - 8.1 g/dL 7.6 7.7 7.4  Total Bilirubin 0.3 - 1.2 mg/dL 0.5 0.6 0.4  Alkaline Phos 38 - 126 U/L 92 82 85  AST 15 - 41 U/L '28 20 19  ' ALT 0 - 44 U/L '12 8 9      ' RADIOGRAPHIC STUDIES: I have personally reviewed the radiological images as listed and agreed with the findings in the report. No results found.    Orders Placed This Encounter  Procedures   CT CHEST ABDOMEN PELVIS W CONTRAST    Standing Status:   Future    Standing Expiration Date:   07/02/2022    Order Specific Question:   Preferred imaging location?    Answer:   [facility]    Order Specific Question:   Is Oral Contrast requested for this exam?    Answer:   Yes, Per Radiology protocol    Order Specific Question:   Reason for Exam (SYMPTOM  OR DIAGNOSIS REQUIRED)    Answer:   evaluate chemo response   All questions were answered. The patient knows to call the clinic with any problems, questions or concerns. No barriers to learning was detected. The total time spent in the appointment was 30 minutes.     [name], MD 07/02/2021   I, Wilburn Mylar, am acting as scribe for [name], MD.   I have  reviewed the above documentation for accuracy and completeness, and I agree with the above.

## 2021-07-02 NOTE — Patient Instructions (Signed)

## 2021-07-02 NOTE — Progress Notes (Signed)
[facility] Spiritual Care Note  Learned from infusion RN that today is Brenda Stewart birthday. Brought her a hand-crochet "blessing blanket" and gift bag of cosmetics as tangible signs of encouragement and celebration. Brenda Stewart was very appreciative and is aware of ongoing chaplain availability, should needs/interest arise.   [place], [place], [facility] Pager [phone] Voicemail [phone]

## 2021-07-03 LAB — CA 125: Cancer Antigen (CA) 125: 10.5 U/mL (ref 0.0–38.1)

## 2021-07-04 ENCOUNTER — Inpatient Hospital Stay: Payer: Medicare Other

## 2021-07-04 ENCOUNTER — Other Ambulatory Visit: Payer: Self-pay

## 2021-07-04 VITALS — BP 131/74 | HR 100 | Temp 98.5°F | Resp 15

## 2021-07-04 DIAGNOSIS — Z5111 Encounter for antineoplastic chemotherapy: Secondary | ICD-10-CM | POA: Diagnosis not present

## 2021-07-04 DIAGNOSIS — Z95828 Presence of other vascular implants and grafts: Secondary | ICD-10-CM

## 2021-07-04 MED ORDER — SODIUM CHLORIDE 0.9% FLUSH
10.0000 mL | Freq: Once | INTRAVENOUS | Status: AC
  Administered 2021-07-04: 10 mL

## 2021-07-04 MED ORDER — HEPARIN SOD (PORK) LOCK FLUSH 100 UNIT/ML IV SOLN
500.0000 [IU] | Freq: Once | INTRAVENOUS | Status: AC
  Administered 2021-07-04: 500 [IU]

## 2021-07-13 MED FILL — Dexamethasone Sodium Phosphate Inj 100 MG/10ML: INTRAMUSCULAR | Qty: 1 | Status: AC

## 2021-07-16 ENCOUNTER — Other Ambulatory Visit: Payer: Self-pay

## 2021-07-16 ENCOUNTER — Inpatient Hospital Stay (HOSPITAL_BASED_OUTPATIENT_CLINIC_OR_DEPARTMENT_OTHER): Payer: Medicare Other | Admitting: Hematology

## 2021-07-16 ENCOUNTER — Inpatient Hospital Stay: Payer: Medicare Other

## 2021-07-16 ENCOUNTER — Telehealth: Payer: Self-pay

## 2021-07-16 ENCOUNTER — Encounter: Payer: Self-pay | Admitting: Hematology

## 2021-07-16 VITALS — BP 157/96 | HR 96 | Temp 98.7°F | Resp 17 | Ht 67.0 in | Wt 182.1 lb

## 2021-07-16 DIAGNOSIS — D490 Neoplasm of unspecified behavior of digestive system: Secondary | ICD-10-CM

## 2021-07-16 DIAGNOSIS — C482 Malignant neoplasm of peritoneum, unspecified: Secondary | ICD-10-CM

## 2021-07-16 DIAGNOSIS — C786 Secondary malignant neoplasm of retroperitoneum and peritoneum: Secondary | ICD-10-CM

## 2021-07-16 DIAGNOSIS — Z95828 Presence of other vascular implants and grafts: Secondary | ICD-10-CM

## 2021-07-16 DIAGNOSIS — Z5111 Encounter for antineoplastic chemotherapy: Secondary | ICD-10-CM | POA: Diagnosis not present

## 2021-07-16 LAB — CBC WITH DIFFERENTIAL (CANCER CENTER ONLY)
Abs Immature Granulocytes: 0.03 10*3/uL (ref 0.00–0.07)
Basophils Absolute: 0 10*3/uL (ref 0.0–0.1)
Basophils Relative: 1 %
Eosinophils Absolute: 0.1 10*3/uL (ref 0.0–0.5)
Eosinophils Relative: 2 %
HCT: 26.2 % — ABNORMAL LOW (ref 36.0–46.0)
Hemoglobin: 9 g/dL — ABNORMAL LOW (ref 12.0–15.0)
Immature Granulocytes: 1 %
Lymphocytes Relative: 47 %
Lymphs Abs: 1.8 10*3/uL (ref 0.7–4.0)
MCH: 32.3 pg (ref 26.0–34.0)
MCHC: 34.4 g/dL (ref 30.0–36.0)
MCV: 93.9 fL (ref 80.0–100.0)
Monocytes Absolute: 0.6 10*3/uL (ref 0.1–1.0)
Monocytes Relative: 16 %
Neutro Abs: 1.3 10*3/uL — ABNORMAL LOW (ref 1.7–7.7)
Neutrophils Relative %: 33 %
Platelet Count: 79 10*3/uL — ABNORMAL LOW (ref 150–400)
RBC: 2.79 MIL/uL — ABNORMAL LOW (ref 3.87–5.11)
RDW: 19.5 % — ABNORMAL HIGH (ref 11.5–15.5)
WBC Count: 3.8 10*3/uL — ABNORMAL LOW (ref 4.0–10.5)
nRBC: 0 % (ref 0.0–0.2)

## 2021-07-16 LAB — CMP (CANCER CENTER ONLY)
ALT: 13 U/L (ref 0–44)
AST: 32 U/L (ref 15–41)
Albumin: 3.6 g/dL (ref 3.5–5.0)
Alkaline Phosphatase: 84 U/L (ref 38–126)
Anion gap: 11 (ref 5–15)
BUN: 14 mg/dL (ref 8–23)
CO2: 23 mmol/L (ref 22–32)
Calcium: 9.1 mg/dL (ref 8.9–10.3)
Chloride: 106 mmol/L (ref 98–111)
Creatinine: 0.96 mg/dL (ref 0.44–1.00)
GFR, Estimated: >60 mL/min (ref >60)
Glucose, Bld: 97 mg/dL (ref 70–99)
Potassium: 3.7 mmol/L (ref 3.5–5.1)
Sodium: 140 mmol/L (ref 135–145)
Total Bilirubin: 0.8 mg/dL (ref 0.3–1.2)
Total Protein: 7.7 g/dL (ref 6.5–8.1)

## 2021-07-16 MED ORDER — DEXTROSE 5 % IV SOLN: Freq: Once | INTRAVENOUS | Status: AC

## 2021-07-16 MED ORDER — OXALIPLATIN CHEMO INJECTION 100 MG/20ML
70.0000 mg/m2 | Freq: Once | INTRAVENOUS | Status: AC
  Administered 2021-07-16: 145 mg via INTRAVENOUS
  Filled 2021-07-16: qty 20

## 2021-07-16 MED ORDER — SODIUM CHLORIDE 0.9 % IV SOLN
10.0000 mg | Freq: Once | INTRAVENOUS | Status: AC
  Administered 2021-07-16: 10 mg via INTRAVENOUS
  Filled 2021-07-16: qty 10

## 2021-07-16 MED ORDER — LEUCOVORIN CALCIUM INJECTION 350 MG
400.0000 mg/m2 | Freq: Once | INTRAVENOUS | Status: AC
  Administered 2021-07-16: 816 mg via INTRAVENOUS
  Filled 2021-07-16: qty 40.8

## 2021-07-16 MED ORDER — SODIUM CHLORIDE 0.9% FLUSH
10.0000 mL | Freq: Once | INTRAVENOUS | Status: AC
  Administered 2021-07-16: 10 mL

## 2021-07-16 MED ORDER — PALONOSETRON HCL INJECTION 0.25 MG/5ML
0.2500 mg | Freq: Once | INTRAVENOUS | Status: AC
  Administered 2021-07-16: 0.25 mg via INTRAVENOUS
  Filled 2021-07-16: qty 5

## 2021-07-16 MED ORDER — SODIUM CHLORIDE 0.9 % IV SOLN
2200.0000 mg/m2 | INTRAVENOUS | Status: DC
  Administered 2021-07-16: 4500 mg via INTRAVENOUS
  Filled 2021-07-16: qty 90

## 2021-07-16 NOTE — Progress Notes (Signed)
[place]   Telephone:(336) [phone] Fax:(336) [phone]   Clinic Follow up Note   Patient Care Team: Pcp, No as PCP - General  Date of Service:  07/16/2021  CHIEF COMPLAINT: f/u of peritoneal carcinomatosis  CURRENT THERAPY:  First line FOLFOX every 2 weeks, started 05/04/21  ASSESSMENT & PLAN:  Brenda Stewart is a 70 y.o. female with   1. 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 CT AP 04/21/21 showed findings suspicious for diffuse peritoneal carcinomatosis. She was admitted and underwent paracentesis on 04/24/21 showing atypical cells. -Diagnostic laparoscopy on 04/30/21 with pathology from peritoneum biopsy showing metastatic carcinoma with signet ring cell features. IHC supports upper GI primary.  -Her EGD was negative for primary malignancy. -her cancer is PD-L1 negative and MMR normal; she is not a candidate for first-line immunotherapy -I started her on FOLFOX on 05/04/21. She has tolerated very well overall  -Labs reviewed, overall adequate to continue treatment. However, her plt count is low at 79k, so I will reduce her dose from today. She will proceed with C6 FOLFOX today. -will plan for restaging CT to be done in the next two weeks   2. Small bowel obstruction, secondary to peritoneal carcinomatosis, Protein calorie malnutrition -she was started on TPN in the hospital, will continue  -continues on soft diet, supplementing with Boost.  -she is now off TPN. She is eating well and weight is stable. -f/u with dietician    3. Mild Anemia -found to have anemia while in the hospital -stable, hgb at 9 today (07/16/21)     PLAN: -proceed with C6 FOLFOX today at reduced dose due to her cytopenia -lab, f/u, and C7 as scheduled in 2 weeks with restaging scan before visit    No  problem-specific Assessment & Plan notes found for this encounter.   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   Suspected distal small bowel obstruction, with probable transition point in right lower quadrant.   Aortic Atherosclerosis (ICD10-I70.0).   04/22/2021 Imaging   US Abdomen  IMPRESSION: Small volume of lower abdominopelvic ascites.   04/24/2021 Pathology Results   Cytology  Specimen Submitted:  A. ASCITES, PARACENTESIS:   FINAL MICROSCOPIC DIAGNOSIS:  - Atypical cells present  DIAGNOSTIC COMMENTS:  Immunohistochemical stains show that the rare, mildly atypical cells are positive for calretinin and D2-40; while they are negative for PAX8, ER and MOC-31.  This immunoprofile is consistent with reactive mesothelial cells.    04/25/2021 Initial Diagnosis   Primary peritoneal carcinomatosis ([place])   04/29/2021 Imaging   CT AP  IMPRESSION: 1. Moderate ascites, mild peritoneal thickening and diffuse omental thickening/caking and smaller focal fluid/cystic areas within the RIGHT abdomen and LOWER pelvis, compatible with peritoneal carcinomatosis. Mild mesenteric stranding is nonspecific. 2. Small subserosal implants along the INFERIOR RIGHT liver. 3. Small LEFT pleural effusion and trace RIGHT pleural effusion with LEFT LOWER lung atelectasis. 4. Aortic Atherosclerosis (ICD10-I70.0).   04/30/2021 Pathology Results   FINAL MICROSCOPIC DIAGNOSIS:   A. 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 PERTIONEUM, ANTERIOR, EXCISION:  -  Metastatic carcinoma with signet ring cell features  -  See comment   COMMENT:   By immunohistochemistry (performed on blocks A2 and B1), the neoplastic cells are positive for cytokeratin AE1/3, cytokeratin 20 and CDX2 but  negative for TTF-1, ER, GATA3, PAX 8 and cytokeratin 7.  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   ADDENDUM:  By immunohistochemistry, HER-2 is EQUIVOCAL (2+).    FLOURESCENCE IN-SITU HYBRIDIZATION RESULTS:  GROUP 5:   HER2 **NEGATIVE**    05/04/2021 -  Chemotherapy   Patient is on Treatment Plan : GASTRIC FOLFOX q14d x 12 cycles     05/04/2021 Imaging   CT Chest  IMPRESSION: 1. Suspected segmental filling defects in the anterior segment right upper lobe and apical segment right upper lobe compatible with small pulmonary emboli. Clot burden appears small, although please note that today's examination was not a dedicated CT angiogram and as such might overestimate or underestimate pulmonary embolus. Positive for acute PE with CT evidence of right heart strain (RV/LV Ratio = 1.1) consistent with at least submassive (intermediate risk) PE. The presence of right heart strain has been associated with an increased risk of morbidity and mortality. Please refer to the "PE Focused" order set in EPIC. 2. No compelling findings of metastatic disease to the chest. There is a small left pleural effusion, but no definite enhancement along the pleura to suggest malignant or exudative etiology at this time. 3. There is some new gas in the soft tissues of the left thoracoabdominal wall, compatible subcutaneous emphysema, probably related to recent laparoscopy or other procedure. 4. Hypodense thyroid nodules up to 2.1 cm in diameter. Recommend thyroid US (ref: J Am Coll Radiol. 2015 Feb;12(2): 143-50). 5. Left upper quadrant ascites, including fluid in the lesser sac. This is reduced compared to the prior CT abdomen of 04/29/2021. 6. Stable appearance of periportal edema and hypodensities along the posterior margin of the right hepatic lobe.   05/07/2021 Pathology Results   FINAL MICROSCOPIC DIAGNOSIS:   A. DUODENUM, BULB, BIOPSY:  -  Polypoid duodenal mucosa with chronic inflammation, foveolar  metaplasia and reactive changes  - Negative for dysplasia or malignancy   Cytology: Specimen Submitted:  A. ESOPHAGUS, BRUSHING:   FINAL MICROSCOPIC DIAGNOSIS:  - Benign reactive/reparative changes    05/16/2021 Cancer Staging   Staging form: Soft Tissue Sarcoma of the Abdomen and Thoracic Visceral Organs, AJCC 8th Edition - Clinical: cTX, cN0, pM1 - Signed by [name], MD on 05/16/2021       INTERVAL HISTORY:  Brenda Stewart is here for a follow up of peritoneal carcinomatosis. She was last seen by me on 07/02/21. She presents to the clinic alone. She reports she is eating very well and has gained a pound since her last visit. She denies any issues from treatment. She reports she is feeling "like nothing is wrong with me." She notes she couldn't sleep last night, but she is unsure why. She notes this is likely what caused her legs to be a bit more swollen than usual today.   All other systems were reviewed with the patient and are negative.  MEDICAL HISTORY:  Past Medical History:  Diagnosis Date   Acid reflux    Hypertension     SURGICAL HISTORY: Past Surgical History:  Procedure Laterality Date   BIOPSY  05/07/2021   Procedure: BIOPSY;  Surgeon: [name], MD;  Location: [facility];  Service: Endoscopy;;   BUNIONECTOMY Bilateral    ESOPHAGEAL BRUSHING  05/07/2021  Procedure: ESOPHAGEAL BRUSHING;  Surgeon: [name], MD;  Location: [facility];  Service: Endoscopy;;   ESOPHAGOGASTRODUODENOSCOPY (EGD) WITH PROPOFOL N/A 05/07/2021   Procedure: ESOPHAGOGASTRODUODENOSCOPY (EGD) WITH PROPOFOL;  Surgeon: [name], MD;  Location: [facility];  Service: Endoscopy;  Laterality: N/A;   HAND TENDON SURGERY Bilateral    IR IMAGING GUIDED PORT INSERTION  04/26/2021   IR PARACENTESIS  04/24/2021   LAPAROSCOPY N/A 04/30/2021   Procedure: LAPAROSCOPY DIAGNOSTIC WITH BIOPSIES;  Surgeon: Lafonda Mosses, MD;   Location: [facility];  Service: Gynecology;  Laterality: N/A;   PARTIAL HYSTERECTOMY      I have reviewed the social history and family history with the patient and they are unchanged from previous note.  ALLERGIES:  has No Known Allergies.  MEDICATIONS:  Current Outpatient Medications  Medication Sig Dispense Refill   APIXABAN (ELIQUIS) VTE STARTER PACK (10MG AND 5MG) Take as directed on package: start with two-69m tablets twice daily for 7 days. On day 8, switch to one-572mtablet twice daily. 1 each 0   ascorbic acid (VITAMIN C) 250 MG CHEW Chew 500 mg by mouth daily.     Cholecalciferol (VITAMIN D3) 50 MCG (2000 UT) CHEW Chew 2 tablets by mouth daily.     folic acid (FOLVITE) 1 MG tablet Take 1 tablet (1 mg total) by mouth daily. 30 tablet 3   lidocaine-prilocaine (EMLA) cream Apply 1 application topically as needed. 30 g 0   omeprazole (PRILOSEC) 20 MG capsule Take 20 mg by mouth daily as needed (heartburn/acid reflux).     prochlorperazine (COMPAZINE) 10 MG tablet Take 1 tablet (10 mg total) by mouth every 6 (six) hours as needed for nausea or vomiting. 30 tablet 2   sennosides-docusate sodium (SENOKOT-S) 8.6-50 MG tablet Take 2 tablets by mouth daily as needed for constipation.     No current facility-administered medications for this visit.   Facility-Administered Medications Ordered in Other Visits  Medication Dose Route Frequency Provider Last Rate Last Admin   fluorouracil (ADRUCIL) 4,500 mg in sodium chloride 0.9 % 60 mL chemo infusion  2,200 mg/m2 (Treatment Plan Recorded) Intravenous 1 day or 1 dose FeTruitt MerleMD   4,500 mg at 07/16/21 1535    PHYSICAL EXAMINATION: ECOG PERFORMANCE STATUS: 1 - Symptomatic but completely ambulatory  Vitals:   07/16/21 1135  BP: (!) 157/96  Pulse: 96  Resp: 17  Temp: 98.7 F (37.1 C)  SpO2: 99%   Wt Readings from Last 3 Encounters:  07/16/21 182 lb 1.6 oz (82.6 kg)  07/02/21 180 lb 8 oz (81.9 kg)  06/18/21 183 lb 6.4 oz (83.2 kg)      GENERAL:alert, no distress and comfortable SKIN: skin color, texture, turgor are normal, no rashes or significant lesions EYES: normal, Conjunctiva are pink and non-injected, sclera clear  NECK: supple, thyroid normal size, non-tender, without nodularity LYMPH:  no palpable lymphadenopathy in the cervical, axillary  LUNGS: clear to auscultation and percussion with normal breathing effort HEART: regular rate & rhythm and no murmurs and no lower extremity edema ABDOMEN:abdomen soft, non-tender and normal bowel sounds Musculoskeletal:no cyanosis of digits and no clubbing  NEURO: alert & oriented x 3 with fluent speech, no focal motor/sensory deficits  LABORATORY DATA:  I have reviewed the data as listed CBC Latest Ref Rng & Units 07/16/2021 07/02/2021 06/18/2021  WBC 4.0 - 10.5 K/uL 3.8(L) 4.0 6.0  Hemoglobin 12.0 - 15.0 g/dL 9.0(L) 9.0(L) 8.3(L)  Hematocrit 36.0 - 46.0 % 26.2(L) 27.3(L) 24.7(L)  Platelets 150 -  400 K/uL 79(L) 115(L) 164     CMP Latest Ref Rng & Units 07/16/2021 07/02/2021 06/18/2021  Glucose 70 - 99 mg/dL 97 111(H) 112(H)  BUN 8 - 23 mg/dL 14 9 24(H)  Creatinine 0.44 - 1.00 mg/dL 0.96 0.89 0.78  Sodium 135 - 145 mmol/L 140 140 136  Potassium 3.5 - 5.1 mmol/L 3.7 3.4(L) 4.1  Chloride 98 - 111 mmol/L 106 105 103  CO2 22 - 32 mmol/L '23 25 24  ' Calcium 8.9 - 10.3 mg/dL 9.1 9.3 9.3  Total Protein 6.5 - 8.1 g/dL 7.7 7.6 7.7  Total Bilirubin 0.3 - 1.2 mg/dL 0.8 0.5 0.6  Alkaline Phos 38 - 126 U/L 84 92 82  AST 15 - 41 U/L 32 28 20  ALT 0 - 44 U/L '13 12 8      ' RADIOGRAPHIC STUDIES: I have personally reviewed the radiological images as listed and agreed with the findings in the report. No results found.    No orders of the defined types were placed in this encounter.  All questions were answered. The patient knows to call the clinic with any problems, questions or concerns. No barriers to learning was detected. The total time spent in the appointment was 30  minutes.     [name], MD 07/16/2021   I, Wilburn Mylar, am acting as scribe for [name], MD.   I have reviewed the above documentation for accuracy and completeness, and I agree with the above.

## 2021-07-16 NOTE — Progress Notes (Signed)
Ok to proceed with treatment with plt of 79 and anc of 1.3 today per Dr. Burr Medico. Dose reduced

## 2021-07-16 NOTE — Patient Instructions (Signed)
[facility]   Discharge Instructions: Thank you for choosing [facility] to provide your oncology and hematology care.   If you have a lab appointment with the [facility], please go directly to the [facility] and check in at the registration area.   Wear comfortable clothing and clothing appropriate for easy access to any Portacath or PICC line.   We strive to give you quality time with your provider. You may need to reschedule your appointment if you arrive late (15 or more minutes).  Arriving late affects you and other patients whose appointments are after yours.  Also, if you miss three or more appointments without notifying the office, you may be dismissed from the clinic at the provider's discretion.      For prescription refill requests, have your pharmacy contact our office and allow 72 hours for refills to be completed.    Today you received the following chemotherapy and/or immunotherapy agents: Folfox (Eloxatin, Leucovorin, Adrucil).      To help prevent nausea and vomiting after your treatment, we encourage you to take your nausea medication as directed.  BELOW ARE SYMPTOMS THAT SHOULD BE REPORTED IMMEDIATELY: *FEVER GREATER THAN 100.4 F (38 C) OR HIGHER *CHILLS OR SWEATING *NAUSEA AND VOMITING THAT IS NOT CONTROLLED WITH YOUR NAUSEA MEDICATION *UNUSUAL SHORTNESS OF BREATH *UNUSUAL BRUISING OR BLEEDING *URINARY PROBLEMS (pain or burning when urinating, or frequent urination) *BOWEL PROBLEMS (unusual diarrhea, constipation, pain near the anus) TENDERNESS IN MOUTH AND THROAT WITH OR WITHOUT PRESENCE OF ULCERS (sore throat, sores in mouth, or a toothache) UNUSUAL RASH, SWELLING OR PAIN  UNUSUAL VAGINAL DISCHARGE OR ITCHING   Items with * indicate a potential emergency and should be followed up as soon as possible or go to the Emergency Department if any problems should occur.  The chemotherapy medication bag should finish at 46  hours, 96 hours, or 7 days. For example, if your pump is scheduled for 46 hours and it was put on at 4:00 p.m., it should finish at 2:00 p.m. the day it is scheduled to come off regardless of your appointment time.     Estimated time to finish at 2:45 pm.   If the display on your pump reads "Low Volume" and it is beeping, take the batteries out of the pump and come to the cancer center for it to be taken off.   If the pump alarms go off prior to the pump reading "Low Volume" then call [phone] and someone can assist you.  If the plunger comes out and the chemotherapy medication is leaking out, please use your home chemo spill kit to clean up the spill. Do NOT use paper towels or other household products.  If you have problems or questions regarding your pump, please call either [phone] (24 hours a day) or the cancer center Monday-Friday 8:00 a.m.- 4:30 p.m. at the clinic number and we will assist you. If you are unable to get assistance, then go to the nearest Emergency Department and ask the staff to contact the IV team for assistance.

## 2021-07-16 NOTE — Telephone Encounter (Signed)
No changes made.  Accessed for scan scheduling.

## 2021-07-18 ENCOUNTER — Other Ambulatory Visit: Payer: Self-pay

## 2021-07-18 ENCOUNTER — Inpatient Hospital Stay: Payer: Medicare Other

## 2021-07-18 VITALS — BP 139/83 | HR 85 | Temp 98.6°F | Resp 16

## 2021-07-18 DIAGNOSIS — Z5111 Encounter for antineoplastic chemotherapy: Secondary | ICD-10-CM | POA: Diagnosis not present

## 2021-07-18 DIAGNOSIS — Z95828 Presence of other vascular implants and grafts: Secondary | ICD-10-CM

## 2021-07-18 MED ORDER — HEPARIN SOD (PORK) LOCK FLUSH 100 UNIT/ML IV SOLN
500.0000 [IU] | Freq: Once | INTRAVENOUS | Status: AC
  Administered 2021-07-18: 500 [IU]

## 2021-07-18 MED ORDER — SODIUM CHLORIDE 0.9% FLUSH
10.0000 mL | Freq: Once | INTRAVENOUS | Status: AC
  Administered 2021-07-18: 10 mL

## 2021-07-27 MED FILL — Dexamethasone Sodium Phosphate Inj 100 MG/10ML: INTRAMUSCULAR | Qty: 1 | Status: AC

## 2021-07-30 ENCOUNTER — Ambulatory Visit (HOSPITAL_COMMUNITY): Payer: TRICARE For Life (TFL)

## 2021-07-30 ENCOUNTER — Inpatient Hospital Stay (HOSPITAL_BASED_OUTPATIENT_CLINIC_OR_DEPARTMENT_OTHER): Payer: Medicare Other | Admitting: Hematology

## 2021-07-30 ENCOUNTER — Other Ambulatory Visit: Payer: Self-pay

## 2021-07-30 ENCOUNTER — Encounter: Payer: Self-pay | Admitting: Hematology

## 2021-07-30 ENCOUNTER — Inpatient Hospital Stay: Payer: Medicare Other

## 2021-07-30 ENCOUNTER — Inpatient Hospital Stay: Payer: Medicare Other | Attending: Hematology

## 2021-07-30 VITALS — BP 158/87 | HR 93 | Temp 98.6°F | Resp 18 | Ht 67.0 in | Wt 182.2 lb

## 2021-07-30 DIAGNOSIS — I700 Atherosclerosis of aorta: Secondary | ICD-10-CM | POA: Diagnosis not present

## 2021-07-30 DIAGNOSIS — D490 Neoplasm of unspecified behavior of digestive system: Secondary | ICD-10-CM

## 2021-07-30 DIAGNOSIS — D649 Anemia, unspecified: Secondary | ICD-10-CM | POA: Insufficient documentation

## 2021-07-30 DIAGNOSIS — K56609 Unspecified intestinal obstruction, unspecified as to partial versus complete obstruction: Secondary | ICD-10-CM | POA: Diagnosis not present

## 2021-07-30 DIAGNOSIS — Z95828 Presence of other vascular implants and grafts: Secondary | ICD-10-CM

## 2021-07-30 DIAGNOSIS — C786 Secondary malignant neoplasm of retroperitoneum and peritoneum: Secondary | ICD-10-CM

## 2021-07-30 DIAGNOSIS — E46 Unspecified protein-calorie malnutrition: Secondary | ICD-10-CM | POA: Diagnosis not present

## 2021-07-30 DIAGNOSIS — R971 Elevated cancer antigen 125 [CA 125]: Secondary | ICD-10-CM | POA: Diagnosis not present

## 2021-07-30 DIAGNOSIS — C801 Malignant (primary) neoplasm, unspecified: Secondary | ICD-10-CM | POA: Insufficient documentation

## 2021-07-30 DIAGNOSIS — Z79899 Other long term (current) drug therapy: Secondary | ICD-10-CM | POA: Diagnosis not present

## 2021-07-30 DIAGNOSIS — Z5111 Encounter for antineoplastic chemotherapy: Secondary | ICD-10-CM | POA: Diagnosis not present

## 2021-07-30 DIAGNOSIS — T451X5A Adverse effect of antineoplastic and immunosuppressive drugs, initial encounter: Secondary | ICD-10-CM | POA: Insufficient documentation

## 2021-07-30 DIAGNOSIS — G620 Drug-induced polyneuropathy: Secondary | ICD-10-CM | POA: Diagnosis not present

## 2021-07-30 DIAGNOSIS — C482 Malignant neoplasm of peritoneum, unspecified: Secondary | ICD-10-CM

## 2021-07-30 LAB — CBC WITH DIFFERENTIAL (CANCER CENTER ONLY)
Abs Immature Granulocytes: 0.02 10*3/uL (ref 0.00–0.07)
Basophils Absolute: 0 10*3/uL (ref 0.0–0.1)
Basophils Relative: 1 %
Eosinophils Absolute: 0.1 10*3/uL (ref 0.0–0.5)
Eosinophils Relative: 1 %
HCT: 29 % — ABNORMAL LOW (ref 36.0–46.0)
Hemoglobin: 9.9 g/dL — ABNORMAL LOW (ref 12.0–15.0)
Immature Granulocytes: 1 %
Lymphocytes Relative: 41 %
Lymphs Abs: 1.6 10*3/uL (ref 0.7–4.0)
MCH: 33.2 pg (ref 26.0–34.0)
MCHC: 34.1 g/dL (ref 30.0–36.0)
MCV: 97.3 fL (ref 80.0–100.0)
Monocytes Absolute: 0.4 10*3/uL (ref 0.1–1.0)
Monocytes Relative: 11 %
Neutro Abs: 1.8 10*3/uL (ref 1.7–7.7)
Neutrophils Relative %: 45 %
Platelet Count: 86 10*3/uL — ABNORMAL LOW (ref 150–400)
RBC: 2.98 MIL/uL — ABNORMAL LOW (ref 3.87–5.11)
RDW: 20.1 % — ABNORMAL HIGH (ref 11.5–15.5)
WBC Count: 3.9 10*3/uL — ABNORMAL LOW (ref 4.0–10.5)
nRBC: 0 % (ref 0.0–0.2)

## 2021-07-30 LAB — CMP (CANCER CENTER ONLY)
ALT: 11 U/L (ref 0–44)
AST: 25 U/L (ref 15–41)
Albumin: 3.6 g/dL (ref 3.5–5.0)
Alkaline Phosphatase: 98 U/L (ref 38–126)
Anion gap: 11 (ref 5–15)
BUN: 16 mg/dL (ref 8–23)
CO2: 24 mmol/L (ref 22–32)
Calcium: 9.4 mg/dL (ref 8.9–10.3)
Chloride: 103 mmol/L (ref 98–111)
Creatinine: 1.02 mg/dL — ABNORMAL HIGH (ref 0.44–1.00)
GFR, Estimated: 60 mL/min — ABNORMAL LOW (ref >60)
Glucose, Bld: 109 mg/dL — ABNORMAL HIGH (ref 70–99)
Potassium: 3.6 mmol/L (ref 3.5–5.1)
Sodium: 138 mmol/L (ref 135–145)
Total Bilirubin: 0.7 mg/dL (ref 0.3–1.2)
Total Protein: 7.9 g/dL (ref 6.5–8.1)

## 2021-07-30 MED ORDER — LEUCOVORIN CALCIUM INJECTION 350 MG
400.0000 mg/m2 | Freq: Once | INTRAVENOUS | Status: AC
  Administered 2021-07-30: 816 mg via INTRAVENOUS
  Filled 2021-07-30: qty 40.8

## 2021-07-30 MED ORDER — SODIUM CHLORIDE 0.9% FLUSH
10.0000 mL | Freq: Once | INTRAVENOUS | Status: AC
  Administered 2021-07-30: 10 mL

## 2021-07-30 MED ORDER — PALONOSETRON HCL INJECTION 0.25 MG/5ML
0.2500 mg | Freq: Once | INTRAVENOUS | Status: AC
  Administered 2021-07-30: 0.25 mg via INTRAVENOUS
  Filled 2021-07-30: qty 5

## 2021-07-30 MED ORDER — OXALIPLATIN CHEMO INJECTION 100 MG/20ML
70.0000 mg/m2 | Freq: Once | INTRAVENOUS | Status: AC
  Administered 2021-07-30: 145 mg via INTRAVENOUS
  Filled 2021-07-30: qty 20

## 2021-07-30 MED ORDER — SODIUM CHLORIDE 0.9 % IV SOLN
2200.0000 mg/m2 | INTRAVENOUS | Status: DC
  Administered 2021-07-30: 4500 mg via INTRAVENOUS
  Filled 2021-07-30: qty 90

## 2021-07-30 MED ORDER — SODIUM CHLORIDE 0.9% FLUSH: 10.0000 mL | INTRAVENOUS | Status: DC | PRN

## 2021-07-30 MED ORDER — DEXTROSE 5 % IV SOLN: Freq: Once | INTRAVENOUS | Status: AC

## 2021-07-30 MED ORDER — HEPARIN SOD (PORK) LOCK FLUSH 100 UNIT/ML IV SOLN: 500.0000 [IU] | Freq: Once | INTRAVENOUS | Status: DC | PRN

## 2021-07-30 MED ORDER — APIXABAN 5 MG PO TABS: 5.0000 mg | ORAL_TABLET | Freq: Two times a day (BID) | ORAL | 2 refills | Status: DC

## 2021-07-30 MED ORDER — DEXAMETHASONE SODIUM PHOSPHATE 100 MG/10ML IJ SOLN
10.0000 mg | Freq: Once | INTRAMUSCULAR | Status: AC
  Administered 2021-07-30: 10 mg via INTRAVENOUS
  Filled 2021-07-30: qty 10

## 2021-07-30 NOTE — Progress Notes (Signed)
Per Dr. Burr Medico, "OK To Treat w/plts 86 today".

## 2021-07-30 NOTE — Patient Instructions (Signed)
[facility]  Discharge Instructions: Thank you for choosing [facility] to provide your oncology and hematology care.   If you have a lab appointment with the [facility], please go directly to the [facility] and check in at the registration area.   Wear comfortable clothing and clothing appropriate for easy access to any Portacath or PICC line.   We strive to give you quality time with your provider. You may need to reschedule your appointment if you arrive late (15 or more minutes).  Arriving late affects you and other patients whose appointments are after yours.  Also, if you miss three or more appointments without notifying the office, you may be dismissed from the clinic at the provider's discretion.      For prescription refill requests, have your pharmacy contact our office and allow 72 hours for refills to be completed.    Today you received the following chemotherapy and/or immunotherapy agents Oxaliplatin, leucovorin, and  5 fU      To help prevent nausea and vomiting after your treatment, we encourage you to take your nausea medication as directed.  BELOW ARE SYMPTOMS THAT SHOULD BE REPORTED IMMEDIATELY: *FEVER GREATER THAN 100.4 F (38 C) OR HIGHER *CHILLS OR SWEATING *NAUSEA AND VOMITING THAT IS NOT CONTROLLED WITH YOUR NAUSEA MEDICATION *UNUSUAL SHORTNESS OF BREATH *UNUSUAL BRUISING OR BLEEDING *URINARY PROBLEMS (pain or burning when urinating, or frequent urination) *BOWEL PROBLEMS (unusual diarrhea, constipation, pain near the anus) TENDERNESS IN MOUTH AND THROAT WITH OR WITHOUT PRESENCE OF ULCERS (sore throat, sores in mouth, or a toothache) UNUSUAL RASH, SWELLING OR PAIN  UNUSUAL VAGINAL DISCHARGE OR ITCHING   Items with * indicate a potential emergency and should be followed up as soon as possible or go to the Emergency Department if any problems should occur.  Please show the CHEMOTHERAPY ALERT CARD or IMMUNOTHERAPY  ALERT CARD at check-in to the Emergency Department and triage nurse.  Should you have questions after your visit or need to cancel or reschedule your appointment, please contact [facility]  Dept: [phone]  and follow the prompts.  Office hours are 8:00 a.m. to 4:30 p.m. Monday - Friday. Please note that voicemails left after 4:00 p.m. may not be returned until the following business day.  We are closed weekends and major holidays. You have access to a nurse at all times for urgent questions. Please call the main number to the clinic Dept: [phone] and follow the prompts.   For any non-urgent questions, you may also contact your provider using MyChart. We now offer e-Visits for anyone 19 and older to request care online for non-urgent symptoms. For details visit [email or web address].   Also download the MyChart app! Go to the app store, search "MyChart", open the app, select [facility], and log in with your MyChart username and password.  Due to Covid, a mask is required upon entering the hospital/clinic. If you do not have a mask, one will be given to you upon arrival. For doctor visits, patients may have 1 support person aged 18 or older with them. For treatment visits, patients cannot have anyone with them due to current Covid guidelines and our immunocompromised population.

## 2021-07-30 NOTE — Progress Notes (Signed)
[facility]   Telephone:([phone] Fax:([phone]   Clinic Follow up Note   Patient Care Team: Pcp, No as PCP - General  Date of Service:  07/30/2021  CHIEF COMPLAINT: f/u of peritoneal carcinomatosis  CURRENT THERAPY:  First line FOLFOX every 2 weeks, started 05/04/21  ASSESSMENT & PLAN:  Brenda Stewart is a 70 y.o. female with   1. 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 CT AP 04/21/21 showed findings suspicious for diffuse peritoneal carcinomatosis. She was admitted and underwent paracentesis on 04/24/21 showing atypical cells. -Diagnostic laparoscopy on 04/30/21 with pathology from peritoneum biopsy showing metastatic carcinoma with signet ring cell features. IHC supports upper GI primary.  -Her EGD was negative for primary malignancy. -her cancer is PD-L1 negative and MMR normal; she is not a candidate for first-line immunotherapy -I started her on FOLFOX on 05/04/21. She has tolerated very well overall. Dose reduced from C6 due to thrombocytopenia. -she is scheduled for restaging CT CAP on 08/01/21.   2. Small bowel obstruction, secondary to peritoneal carcinomatosis, Protein calorie malnutrition -she was started on TPN in the hospital, will continue  -continues on soft diet, supplementing with Boost.  -she is now off TPN. She is eating well and weight is stable. -f/u with dietician    3. Mild Anemia -found to have anemia while in the hospital -stable, hgb at 9 today (07/16/21)     PLAN: -proceed with C7 FOLFOX today at same reduced dose, will check lab next week for nadir plt (she is on eliquis) -CT CAP 08/01/21, she will drink the contrast at her pump d/c appointment -lab, f/u, and C8 as scheduled in 2 weeks    No problem-specific Assessment & Plan notes found for this  encounter.   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   Suspected distal small bowel obstruction, with probable transition point in right lower quadrant.   Aortic Atherosclerosis (ICD10-I70.0).   04/22/2021 Imaging   US Abdomen  IMPRESSION: Small volume of lower abdominopelvic ascites.   04/24/2021 Pathology Results   Cytology  Specimen Submitted:  A. ASCITES, PARACENTESIS:   FINAL MICROSCOPIC DIAGNOSIS:  - Atypical cells present  DIAGNOSTIC COMMENTS:  Immunohistochemical stains show that the rare, mildly atypical cells are positive for calretinin and D2-40; while they are negative for PAX8, ER and MOC-31.  This immunoprofile is consistent with reactive mesothelial cells.    04/25/2021 Initial Diagnosis   Primary peritoneal carcinomatosis ([facility])   04/29/2021 Imaging   CT AP  IMPRESSION: 1. Moderate ascites, mild peritoneal thickening and diffuse omental thickening/caking and smaller focal fluid/cystic areas within the RIGHT abdomen and LOWER pelvis, compatible with peritoneal carcinomatosis. Mild mesenteric stranding is nonspecific. 2. Small subserosal implants along the INFERIOR RIGHT liver. 3. Small LEFT pleural effusion and trace RIGHT pleural effusion with LEFT LOWER lung atelectasis. 4. Aortic Atherosclerosis (ICD10-I70.0).   04/30/2021 Pathology Results   FINAL MICROSCOPIC DIAGNOSIS:   A. 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 PERTIONEUM, ANTERIOR, EXCISION:  -  Metastatic carcinoma with signet ring cell features  -  See comment   COMMENT:   By immunohistochemistry (performed  on blocks A2 and B1), the neoplastic cells are positive for cytokeratin AE1/3, cytokeratin 20 and CDX2 but negative for TTF-1, ER, GATA3, PAX 8 and cytokeratin 7.   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   ADDENDUM:  By immunohistochemistry, HER-2 is EQUIVOCAL (2+).    FLOURESCENCE IN-SITU HYBRIDIZATION RESULTS:  GROUP 5:   HER2 **NEGATIVE**    05/04/2021 -  Chemotherapy   Patient is on Treatment Plan : GASTRIC FOLFOX q14d x 12 cycles     05/04/2021 Imaging   CT Chest  IMPRESSION: 1. Suspected segmental filling defects in the anterior segment right upper lobe and apical segment right upper lobe compatible with small pulmonary emboli. Clot burden appears small, although please note that today's examination was not a dedicated CT angiogram and as such might overestimate or underestimate pulmonary embolus. Positive for acute PE with CT evidence of right heart strain (RV/LV Ratio = 1.1) consistent with at least submassive (intermediate risk) PE. The presence of right heart strain has been associated with an increased risk of morbidity and mortality. Please refer to the "PE Focused" order set in EPIC. 2. No compelling findings of metastatic disease to the chest. There is a small left pleural effusion, but no definite enhancement along the pleura to suggest malignant or exudative etiology at this time. 3. There is some new gas in the soft tissues of the left thoracoabdominal wall, compatible subcutaneous emphysema, probably related to recent laparoscopy or other procedure. 4. Hypodense thyroid nodules up to 2.1 cm in diameter. Recommend thyroid US (ref: J Am Coll Radiol. 2015 Feb;12(2): 143-50). 5. Left upper quadrant ascites, including fluid in the lesser sac. This is reduced compared to the prior CT abdomen of 04/29/2021. 6. Stable appearance of periportal edema and hypodensities along the posterior margin of the right hepatic lobe.   05/07/2021 Pathology Results   FINAL MICROSCOPIC DIAGNOSIS:   A. DUODENUM, BULB, BIOPSY:  - Polypoid duodenal mucosa with chronic inflammation,  foveolar  metaplasia and reactive changes  - Negative for dysplasia or malignancy   Cytology: Specimen Submitted:  A. ESOPHAGUS, BRUSHING:   FINAL MICROSCOPIC DIAGNOSIS:  - Benign reactive/reparative changes    05/16/2021 Cancer Staging   Staging form: Soft Tissue Sarcoma of the Abdomen and Thoracic Visceral Organs, AJCC 8th Edition - Clinical: cTX, cN0, pM1 - Signed by [name], MD on 05/16/2021       INTERVAL HISTORY:  Brenda Stewart is here for a follow up of peritoneal carcinomatosis. She was last seen by me on 07/16/21. She presents to the clinic alone. She reports doing very well-- she is eating, bowel movements are regular, no issues from chemo.   All other systems were reviewed with the patient and are negative.  MEDICAL HISTORY:  Past Medical History:  Diagnosis Date   Acid reflux    Hypertension     SURGICAL HISTORY: Past Surgical History:  Procedure Laterality Date   BIOPSY  05/07/2021   Procedure: BIOPSY;  Surgeon: [name], MD;  Location: [facility];  Service: Endoscopy;;   BUNIONECTOMY Bilateral    ESOPHAGEAL BRUSHING  05/07/2021   Procedure: ESOPHAGEAL BRUSHING;  Surgeon: [name], MD;  Location: [facility];  Service: Endoscopy;;   ESOPHAGOGASTRODUODENOSCOPY (EGD) WITH PROPOFOL N/A 05/07/2021   Procedure: ESOPHAGOGASTRODUODENOSCOPY (EGD) WITH PROPOFOL;  Surgeon: [name], MD;  Location: [facility];  Service: Endoscopy;  Laterality: N/A;   HAND TENDON SURGERY Bilateral    IR IMAGING GUIDED  PORT INSERTION  04/26/2021   IR PARACENTESIS  04/24/2021   LAPAROSCOPY N/A 04/30/2021   Procedure: LAPAROSCOPY DIAGNOSTIC WITH BIOPSIES;  Surgeon: Lafonda Mosses, MD;  Location: [facility];  Service: Gynecology;  Laterality: N/A;   PARTIAL HYSTERECTOMY      I have reviewed the social history and family history with the patient and they are unchanged from previous note.  ALLERGIES:  has No Known Allergies.  MEDICATIONS:  Current Outpatient Medications   Medication Sig Dispense Refill   apixaban (ELIQUIS) 5 MG TABS tablet Take 1 tablet (5 mg total) by mouth 2 (two) times daily. 60 tablet 2   ascorbic acid (VITAMIN C) 250 MG CHEW Chew 500 mg by mouth daily.     Cholecalciferol (VITAMIN D3) 50 MCG (2000 UT) CHEW Chew 2 tablets by mouth daily.     folic acid (FOLVITE) 1 MG tablet Take 1 tablet (1 mg total) by mouth daily. 30 tablet 3   lidocaine-prilocaine (EMLA) cream Apply 1 application topically as needed. 30 g 0   omeprazole (PRILOSEC) 20 MG capsule Take 20 mg by mouth daily as needed (heartburn/acid reflux).     prochlorperazine (COMPAZINE) 10 MG tablet Take 1 tablet (10 mg total) by mouth every 6 (six) hours as needed for nausea or vomiting. 30 tablet 2   sennosides-docusate sodium (SENOKOT-S) 8.6-50 MG tablet Take 2 tablets by mouth daily as needed for constipation.     No current facility-administered medications for this visit.    PHYSICAL EXAMINATION: ECOG PERFORMANCE STATUS: 1 - Symptomatic but completely ambulatory  Vitals:   07/30/21 0903  BP: (!) 158/87  Pulse: 93  Resp: 18  Temp: 98.6 F (37 C)  SpO2: 100%   Wt Readings from Last 3 Encounters:  07/30/21 182 lb 3.2 oz (82.6 kg)  07/16/21 182 lb 1.6 oz (82.6 kg)  07/02/21 180 lb 8 oz (81.9 kg)     GENERAL:alert, no distress and comfortable SKIN: skin color normal, no rashes or significant lesions EYES: normal, Conjunctiva are pink and non-injected, sclera clear  NEURO: alert & oriented x 3 with fluent speech  LABORATORY DATA:  I have reviewed the data as listed CBC Latest Ref Rng & Units 07/30/2021 07/16/2021 07/02/2021  WBC 4.0 - 10.5 K/uL 3.9(L) 3.8(L) 4.0  Hemoglobin 12.0 - 15.0 g/dL 9.9(L) 9.0(L) 9.0(L)  Hematocrit 36.0 - 46.0 % 29.0(L) 26.2(L) 27.3(L)  Platelets 150 - 400 K/uL 86(L) 79(L) 115(L)     CMP Latest Ref Rng & Units 07/30/2021 07/16/2021 07/02/2021  Glucose 70 - 99 mg/dL 109(H) 97 111(H)  BUN 8 - 23 mg/dL '16 14 9  ' Creatinine 0.44 - 1.00 mg/dL  1.02(H) 0.96 0.89  Sodium 135 - 145 mmol/L 138 140 140  Potassium 3.5 - 5.1 mmol/L 3.6 3.7 3.4(L)  Chloride 98 - 111 mmol/L 103 106 105  CO2 22 - 32 mmol/L '24 23 25  ' Calcium 8.9 - 10.3 mg/dL 9.4 9.1 9.3  Total Protein 6.5 - 8.1 g/dL 7.9 7.7 7.6  Total Bilirubin 0.3 - 1.2 mg/dL 0.7 0.8 0.5  Alkaline Phos 38 - 126 U/L 98 84 92  AST 15 - 41 U/L 25 32 28  ALT 0 - 44 U/L '11 13 12      ' RADIOGRAPHIC STUDIES: I have personally reviewed the radiological images as listed and agreed with the findings in the report. No results found.    No orders of the defined types were placed in this encounter.  All questions were answered. The patient knows  to call the clinic with any problems, questions or concerns. No barriers to learning was detected. The total time spent in the appointment was 30 minutes.     [name], MD 07/30/2021   I, Wilburn Mylar, am acting as scribe for [name], MD.   I have reviewed the above documentation for accuracy and completeness, and I agree with the above.

## 2021-07-31 ENCOUNTER — Telehealth: Payer: Self-pay | Admitting: Hematology

## 2021-07-31 LAB — CA 125: Cancer Antigen (CA) 125: 9.3 U/mL (ref 0.0–38.1)

## 2021-07-31 NOTE — Telephone Encounter (Signed)
Left message with follow-up appointment per 11/7 los. 

## 2021-08-01 ENCOUNTER — Other Ambulatory Visit: Payer: Self-pay

## 2021-08-01 ENCOUNTER — Inpatient Hospital Stay: Payer: Medicare Other

## 2021-08-01 ENCOUNTER — Ambulatory Visit (HOSPITAL_COMMUNITY): Payer: Medicare Other

## 2021-08-01 VITALS — BP 127/78 | HR 84 | Temp 98.5°F | Resp 17

## 2021-08-01 DIAGNOSIS — Z5111 Encounter for antineoplastic chemotherapy: Secondary | ICD-10-CM | POA: Diagnosis not present

## 2021-08-01 DIAGNOSIS — Z95828 Presence of other vascular implants and grafts: Secondary | ICD-10-CM

## 2021-08-01 MED ORDER — SODIUM CHLORIDE 0.9% FLUSH
10.0000 mL | Freq: Once | INTRAVENOUS | Status: AC
Start: 2021-08-01 — End: 2021-08-01
  Administered 2021-08-01: 10 mL

## 2021-08-01 MED ORDER — HEPARIN SOD (PORK) LOCK FLUSH 100 UNIT/ML IV SOLN
500.0000 [IU] | Freq: Once | INTRAVENOUS | Status: AC
  Administered 2021-08-01: 500 [IU]

## 2021-08-06 ENCOUNTER — Other Ambulatory Visit: Payer: Self-pay

## 2021-08-06 ENCOUNTER — Inpatient Hospital Stay: Payer: Medicare Other

## 2021-08-06 DIAGNOSIS — Z5111 Encounter for antineoplastic chemotherapy: Secondary | ICD-10-CM | POA: Diagnosis not present

## 2021-08-06 DIAGNOSIS — Z95828 Presence of other vascular implants and grafts: Secondary | ICD-10-CM

## 2021-08-06 DIAGNOSIS — C482 Malignant neoplasm of peritoneum, unspecified: Secondary | ICD-10-CM

## 2021-08-06 DIAGNOSIS — D490 Neoplasm of unspecified behavior of digestive system: Secondary | ICD-10-CM

## 2021-08-06 LAB — CBC WITH DIFFERENTIAL (CANCER CENTER ONLY)
Abs Immature Granulocytes: 0.03 10*3/uL (ref 0.00–0.07)
Basophils Absolute: 0 10*3/uL (ref 0.0–0.1)
Basophils Relative: 1 %
Eosinophils Absolute: 0.1 10*3/uL (ref 0.0–0.5)
Eosinophils Relative: 2 %
HCT: 28.7 % — ABNORMAL LOW (ref 36.0–46.0)
Hemoglobin: 9.7 g/dL — ABNORMAL LOW (ref 12.0–15.0)
Immature Granulocytes: 1 %
Lymphocytes Relative: 52 %
Lymphs Abs: 1.7 10*3/uL (ref 0.7–4.0)
MCH: 33.6 pg (ref 26.0–34.0)
MCHC: 33.8 g/dL (ref 30.0–36.0)
MCV: 99.3 fL (ref 80.0–100.0)
Monocytes Absolute: 0.4 10*3/uL (ref 0.1–1.0)
Monocytes Relative: 11 %
Neutro Abs: 1.1 10*3/uL — ABNORMAL LOW (ref 1.7–7.7)
Neutrophils Relative %: 33 %
Platelet Count: 155 10*3/uL (ref 150–400)
RBC: 2.89 MIL/uL — ABNORMAL LOW (ref 3.87–5.11)
RDW: 19.2 % — ABNORMAL HIGH (ref 11.5–15.5)
WBC Count: 3.3 10*3/uL — ABNORMAL LOW (ref 4.0–10.5)
nRBC: 0 % (ref 0.0–0.2)

## 2021-08-06 LAB — CMP (CANCER CENTER ONLY)
ALT: 9 U/L (ref 0–44)
AST: 23 U/L (ref 15–41)
Albumin: 3.7 g/dL (ref 3.5–5.0)
Alkaline Phosphatase: 85 U/L (ref 38–126)
Anion gap: 10 (ref 5–15)
BUN: 13 mg/dL (ref 8–23)
CO2: 24 mmol/L (ref 22–32)
Calcium: 9 mg/dL (ref 8.9–10.3)
Chloride: 104 mmol/L (ref 98–111)
Creatinine: 0.89 mg/dL (ref 0.44–1.00)
GFR, Estimated: >60 mL/min (ref >60)
Glucose, Bld: 109 mg/dL — ABNORMAL HIGH (ref 70–99)
Potassium: 3.4 mmol/L — ABNORMAL LOW (ref 3.5–5.1)
Sodium: 138 mmol/L (ref 135–145)
Total Bilirubin: 0.5 mg/dL (ref 0.3–1.2)
Total Protein: 7.9 g/dL (ref 6.5–8.1)

## 2021-08-06 MED ORDER — SODIUM CHLORIDE 0.9% FLUSH
10.0000 mL | Freq: Once | INTRAVENOUS | Status: AC
  Administered 2021-08-06: 10 mL

## 2021-08-06 MED ORDER — HEPARIN SOD (PORK) LOCK FLUSH 100 UNIT/ML IV SOLN
500.0000 [IU] | Freq: Once | INTRAVENOUS | Status: AC
  Administered 2021-08-06: 500 [IU]

## 2021-08-10 ENCOUNTER — Other Ambulatory Visit: Payer: Self-pay

## 2021-08-10 ENCOUNTER — Ambulatory Visit (HOSPITAL_COMMUNITY)
Admission: RE | Admit: 2021-08-10 | Discharge: 2021-08-10 | Disposition: A | Discharge status: Home / Self Care | Payer: Medicare Other | Source: Ambulatory Visit | Attending: Hematology | Admitting: Hematology

## 2021-08-10 DIAGNOSIS — C786 Secondary malignant neoplasm of retroperitoneum and peritoneum: Secondary | ICD-10-CM | POA: Insufficient documentation

## 2021-08-10 MED ORDER — SODIUM CHLORIDE (PF) 0.9 % IJ SOLN
INTRAMUSCULAR | Status: AC
  Filled 2021-08-10: qty 50

## 2021-08-10 MED ORDER — IOHEXOL 350 MG/ML SOLN
80.0000 mL | Freq: Once | INTRAVENOUS | Status: AC | PRN
  Administered 2021-08-10: 80 mL via INTRAVENOUS

## 2021-08-10 MED ORDER — HEPARIN SOD (PORK) LOCK FLUSH 100 UNIT/ML IV SOLN
500.0000 [IU] | Freq: Once | INTRAVENOUS | Status: AC
  Administered 2021-08-10: 500 [IU] via INTRAVENOUS

## 2021-08-10 MED FILL — Dexamethasone Sodium Phosphate Inj 100 MG/10ML: INTRAMUSCULAR | Qty: 1 | Status: AC

## 2021-08-12 NOTE — Progress Notes (Signed)
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   Suspected distal small bowel obstruction, with probable transition point in right lower quadrant.   Aortic Atherosclerosis (ICD10-I70.0).   04/22/2021 Imaging   US Abdomen  IMPRESSION: Small volume of lower abdominopelvic ascites.   04/24/2021 Pathology Results   Cytology  Specimen Submitted:  A. ASCITES, PARACENTESIS:   FINAL MICROSCOPIC DIAGNOSIS:  - Atypical cells present  DIAGNOSTIC COMMENTS:  Immunohistochemical stains show that the rare, mildly atypical cells are positive for calretinin and D2-40; while they are negative for PAX8, ER and MOC-31.  This immunoprofile is consistent with reactive mesothelial cells.    04/25/2021 Initial Diagnosis   Primary peritoneal carcinomatosis ([place])   04/29/2021 Imaging   CT AP  IMPRESSION: 1. Moderate ascites, mild peritoneal thickening and diffuse omental thickening/caking and smaller focal fluid/cystic areas within the RIGHT abdomen and LOWER pelvis, compatible with peritoneal carcinomatosis. Mild mesenteric stranding is nonspecific. 2. Small subserosal implants along the INFERIOR RIGHT liver. 3. Small LEFT pleural effusion and trace RIGHT pleural effusion with LEFT LOWER lung atelectasis. 4. Aortic Atherosclerosis (ICD10-I70.0).   04/30/2021 Pathology Results   FINAL MICROSCOPIC DIAGNOSIS:   A. 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   ADDENDUM:  By immunohistochemistry, HER-2 is EQUIVOCAL (2+).    FLOURESCENCE IN-SITU HYBRIDIZATION RESULTS:  GROUP 5:   HER2 **NEGATIVE**    05/04/2021 -  Chemotherapy   Patient is on Treatment Plan : GASTRIC FOLFOX q14d x 12 cycles     05/04/2021 Imaging   CT Chest  IMPRESSION: 1. Suspected segmental filling defects in the anterior segment right upper lobe and apical segment right upper lobe compatible with small pulmonary emboli. Clot burden appears small, although please note that today's examination was not a dedicated CT angiogram and as such might overestimate or underestimate pulmonary embolus. Positive for acute PE with CT evidence of right heart strain (RV/LV Ratio = 1.1) consistent with at least submassive (intermediate risk) PE. The presence of right heart strain has been associated with an increased risk of morbidity and mortality. Please refer to the "PE Focused" order set in EPIC. 2. No compelling findings of metastatic disease to the chest. There is a small left pleural effusion, but no definite enhancement along the pleura to suggest malignant or exudative etiology at this time. 3. There is some new gas in the soft tissues of the left thoracoabdominal wall, compatible subcutaneous emphysema, probably related to recent laparoscopy or other procedure. 4. Hypodense thyroid nodules up to 2.1 cm in diameter. Recommend thyroid US (ref: J Am Coll Radiol. 2015 Feb;12(2): 143-50). 5. Left upper quadrant ascites, including fluid in the lesser sac. This is reduced  compared to the prior CT abdomen of 04/29/2021. 6. Stable appearance of periportal edema and hypodensities along  the posterior margin of the right hepatic lobe.   05/07/2021 Pathology Results   FINAL MICROSCOPIC DIAGNOSIS:   A. DUODENUM, BULB, BIOPSY:  - Polypoid duodenal mucosa with chronic inflammation, foveolar  metaplasia and reactive changes  - Negative for dysplasia or malignancy   Cytology: Specimen Submitted:  A. ESOPHAGUS, BRUSHING:   FINAL MICROSCOPIC DIAGNOSIS:  - Benign reactive/reparative changes    05/16/2021 Cancer Staging   Staging form: Soft Tissue Sarcoma of the Abdomen and Thoracic Visceral Organs, AJCC 8th Edition - Clinical: cTX, cN0, pM1 - Signed by [name], MD on 05/16/2021    08/10/2021 Imaging   CT CAP IMPRESSION: 1. Subserosal implants along the inferior right liver are no longer apparent or decreased in size when compared to prior exam. 2. Interval resolution of abdominal ascites. Decreased peritoneal thickening and stranding. 3. Interval resolution of small left pleural effusion. 4.  Aortic Atherosclerosis (ICD10-I70.0).       CURRENT THERAPY: First line FOLFOX q2 weeks, starting 05/04/21  INTERVAL HISTORY: Brenda Stewart returns for follow up and treatment as scheduled. Last seen by Dr. Burr Medico 07/30/21 and completed cycle 7 FOLFOX.  She feels well overall, energy and appetite are adequate.  Cold sensitivity last 2 days.  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  Denies fall or balance issue.  She had vomiting last week that resolved spontaneously, she thinks may be diet related.  She resumed lisinopril and amlodipine, legs are swollen but improving.  Bowels moving well, denies new or worsening pain, bleeding/bruising, mucositis, rash, fever, chills, cough, chest pain, or dyspnea.   MEDICAL HISTORY:  Past Medical History:  Diagnosis Date   Acid reflux    Hypertension     SURGICAL HISTORY: Past Surgical History:  Procedure Laterality Date   BIOPSY  05/07/2021   Procedure: BIOPSY;  Surgeon: [name], MD;  Location: [facility];  Service:  Endoscopy;;   BUNIONECTOMY Bilateral    ESOPHAGEAL BRUSHING  05/07/2021   Procedure: ESOPHAGEAL BRUSHING;  Surgeon: [name], MD;  Location: [facility];  Service: Endoscopy;;   ESOPHAGOGASTRODUODENOSCOPY (EGD) WITH PROPOFOL N/A 05/07/2021   Procedure: ESOPHAGOGASTRODUODENOSCOPY (EGD) WITH PROPOFOL;  Surgeon: [name], MD;  Location: [facility];  Service: Endoscopy;  Laterality: N/A;   HAND TENDON SURGERY Bilateral    IR IMAGING GUIDED PORT INSERTION  04/26/2021   IR PARACENTESIS  04/24/2021   LAPAROSCOPY N/A 04/30/2021   Procedure: LAPAROSCOPY DIAGNOSTIC WITH BIOPSIES;  Surgeon: Lafonda Mosses, MD;  Location: [facility];  Service: Gynecology;  Laterality: N/A;   PARTIAL HYSTERECTOMY      I have reviewed the social history and family history with the patient and they are unchanged from previous note.  ALLERGIES:  has No Known Allergies.  MEDICATIONS:  Current Outpatient Medications  Medication Sig Dispense Refill   apixaban (ELIQUIS) 5 MG TABS tablet Take 1 tablet (5 mg total) by mouth 2 (two) times daily. 60 tablet 2   ascorbic acid (VITAMIN C) 250 MG CHEW Chew 500 mg by mouth daily.     Cholecalciferol (VITAMIN D3) 50 MCG (2000 UT) CHEW Chew 2 tablets by mouth daily.     folic acid (FOLVITE) 1 MG tablet Take 1 tablet (1 mg total) by mouth daily. 30 tablet 3   lidocaine-prilocaine (EMLA) cream Apply 1 application topically as needed. 30 g 0   omeprazole (PRILOSEC) 20 MG capsule Take 20 mg  by mouth daily as needed (heartburn/acid reflux).     prochlorperazine (COMPAZINE) 10 MG tablet Take 1 tablet (10 mg total) by mouth every 6 (six) hours as needed for nausea or vomiting. 30 tablet 2   sennosides-docusate sodium (SENOKOT-S) 8.6-50 MG tablet Take 2 tablets by mouth daily as needed for constipation.     No current facility-administered medications for this visit.   Facility-Administered Medications Ordered in Other Visits  Medication Dose Route Frequency Provider Last Rate Last  Admin   fluorouracil (ADRUCIL) 4,100 mg in sodium chloride 0.9 % 68 mL chemo infusion  2,000 mg/m2 (Treatment Plan Recorded) Intravenous 1 day or 1 dose [name], MD       heparin lock flush 100 unit/mL  500 Units Intracatheter Once PRN [name], MD       leucovorin 816 mg in dextrose 5 % 250 mL infusion  400 mg/m2 (Treatment Plan Recorded) Intravenous Once [name], MD       oxaliplatin (ELOXATIN) 145 mg in dextrose 5 % 500 mL chemo infusion  70 mg/m2 (Treatment Plan Recorded) Intravenous Once [name], MD       sodium chloride flush (NS) 0.9 % injection 10 mL  10 mL Intracatheter PRN [name], MD        PHYSICAL EXAMINATION: ECOG PERFORMANCE STATUS: 1 - Symptomatic but completely ambulatory  Vitals:   08/13/21 0935  BP: (!) 156/82  Pulse: 93  Resp: 18  Temp: 98 F (36.7 C)  SpO2: 97%   Filed Weights   08/13/21 0935  Weight: 184 lb 2 oz (83.5 kg)    GENERAL:alert, no distress and comfortable SKIN:no rash  EYES: sclera clear LUNGS: normal breathing effort HEART: mild bilateral lower extremity edema NEURO: alert & oriented x 3 with fluent speech, no focal motor deficits PAC without erythema  LABORATORY DATA:  I have reviewed the data as listed CBC Latest Ref Rng & Units 08/13/2021 08/06/2021 07/30/2021  WBC 4.0 - 10.5 K/uL 2.8(L) 3.3(L) 3.9(L)  Hemoglobin 12.0 - 15.0 g/dL 9.8(L) 9.7(L) 9.9(L)  Hematocrit 36.0 - 46.0 % 28.1(L) 28.7(L) 29.0(L)  Platelets 150 - 400 K/uL 73(L) 155 86(L)     CMP Latest Ref Rng & Units 08/13/2021 08/06/2021 07/30/2021  Glucose 70 - 99 mg/dL 116(H) 109(H) 109(H)  BUN 8 - 23 mg/dL _0 Creatinine 0.44 - 1.00 mg/dL 0.82 0.89 1.02(H)  Sodium 135 - 145 mmol/L 141 138 138  Potassium 3.5 - 5.1 mmol/L 3.5 3.4(L) 3.6  Chloride 98 - 111 mmol/L 108 104 103  CO2 22 - 32 mmol/L _1 Calcium 8.9 - 10.3 mg/dL 8.9 9.0 9.4  Total Protein 6.5 - 8.1 g/dL 7.9 7.9 7.9  Total Bilirubin 0.3 - 1.2 mg/dL 0.3 0.5 0.7  Alkaline Phos 38 - 126 U/L 100  85 98  AST 15 - 41 U/L _2 ALT 0 - 44 U/L _3 RADIOGRAPHIC STUDIES: I have personally reviewed the radiological images as listed and agreed with the findings in the report. No results found.   ASSESSMENT & PLAN: Brenda Stewart is a 70 y.o. female with    1. 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 CT AP 04/21/21 showed findings suspicious for diffuse peritoneal carcinomatosis. She was admitted and underwent paracentesis on  04/24/21 showing atypical cells. -Diagnostic laparoscopy on 04/30/21 with pathology from peritoneum biopsy showing metastatic carcinoma with signet ring cell features. IHC supports upper GI primary.  -Her EGD was negative for primary malignancy. -her cancer is PD-L1 negative and MMR normal; she is not a candidate for first-line immunotherapy -she began First line FOLFOX on 05/04/21, tolerating very well overall. Dose reduced from C6 due to thrombocytopenia. -Ms. Abid appears stable, s/p cycle 7 FOLFOX, she continues to tolerate very well with minimal side effects.  She is able to recover and function well with good performance status.  There is no clinical evidence of disease progression  -I reviewed restaging CT CAP from 08/10/21 (s/p 7 cycles of FOLFOX) which showed decreased peritoneal thickening/stranding, resolution of ascites, and reduced size of inferior right liver implant.  No new lesion or evidence of progression.  She has responded well to treatment.  -Continue FOLFOX every 2 weeks at same dose for now -Labs reviewed, she has persistent thrombocytopenia platelet 73 and worsening neutropenia ANC 0.9.  Her nadir platelet count last week was normal at 155.  Oxaliplatin previously dose reduced to 70 mg/m2, will continue same dose -we will make further dose reductions with 5-FU to  2000 mg/m2 for thrombocytopenia and neutropenia today with cycle 8 and add on G-CSF.  Potential risk, benefit, and side effects were discussed.  She agrees to proceed. -We reviewed pancytopenia precautions, she knows to call with new concerns.  Otherwise follow-up with next cycle in 2 weeks. -plan reviewed with Dr. Burr Medico   2. Small bowel obstruction, secondary to peritoneal carcinomatosis, Protein calorie malnutrition -she was started on TPN in the hospital, will continue  -continues on soft diet, supplementing with Boost.  -she is now off TPN. She is eating well and weight is stable. -f/u with dietician    3. Mild Anemia -found to have anemia while in the hospital -stable  Plan: -Labs and CT reviewed, overall good response -Proceed with cycle 8 FOLFOX as planned, oxaliplatin already dose reduced to 70 mg/m2, will reduce 5-FU to 2000 mg/m2 -Add G-CSF on day 3, potential side effects/risk/benefit reviewed, patient agrees, no prior Auth required -Reviewed neutropenic and thrombocytopenic precautions -Follow-up in 2 weeks with next cycle -Plan reviewed with Dr. Burr Medico  All questions were answered. The patient knows to call the clinic with any problems, questions or concerns. No barriers to learning were detected. Total encounter time was 30 minutes.      Alla Feeling, NP 08/13/21

## 2021-08-13 ENCOUNTER — Encounter: Payer: Self-pay | Admitting: Nurse Practitioner

## 2021-08-13 ENCOUNTER — Inpatient Hospital Stay (HOSPITAL_BASED_OUTPATIENT_CLINIC_OR_DEPARTMENT_OTHER): Payer: Medicare Other | Admitting: Nurse Practitioner

## 2021-08-13 ENCOUNTER — Inpatient Hospital Stay: Payer: Medicare Other

## 2021-08-13 ENCOUNTER — Other Ambulatory Visit: Payer: Self-pay

## 2021-08-13 VITALS — BP 156/82 | HR 93 | Temp 98.0°F | Resp 18 | Wt 184.1 lb

## 2021-08-13 DIAGNOSIS — D490 Neoplasm of unspecified behavior of digestive system: Secondary | ICD-10-CM

## 2021-08-13 DIAGNOSIS — Z5111 Encounter for antineoplastic chemotherapy: Secondary | ICD-10-CM | POA: Diagnosis not present

## 2021-08-13 DIAGNOSIS — C482 Malignant neoplasm of peritoneum, unspecified: Secondary | ICD-10-CM | POA: Diagnosis not present

## 2021-08-13 DIAGNOSIS — C786 Secondary malignant neoplasm of retroperitoneum and peritoneum: Secondary | ICD-10-CM

## 2021-08-13 DIAGNOSIS — Z95828 Presence of other vascular implants and grafts: Secondary | ICD-10-CM

## 2021-08-13 LAB — CMP (CANCER CENTER ONLY)
ALT: 11 U/L (ref 0–44)
AST: 24 U/L (ref 15–41)
Albumin: 3.6 g/dL (ref 3.5–5.0)
Alkaline Phosphatase: 100 U/L (ref 38–126)
Anion gap: 11 (ref 5–15)
BUN: 11 mg/dL (ref 8–23)
CO2: 22 mmol/L (ref 22–32)
Calcium: 8.9 mg/dL (ref 8.9–10.3)
Chloride: 108 mmol/L (ref 98–111)
Creatinine: 0.82 mg/dL (ref 0.44–1.00)
GFR, Estimated: >60 mL/min (ref >60)
Glucose, Bld: 116 mg/dL — ABNORMAL HIGH (ref 70–99)
Potassium: 3.5 mmol/L (ref 3.5–5.1)
Sodium: 141 mmol/L (ref 135–145)
Total Bilirubin: 0.3 mg/dL (ref 0.3–1.2)
Total Protein: 7.9 g/dL (ref 6.5–8.1)

## 2021-08-13 LAB — CBC WITH DIFFERENTIAL (CANCER CENTER ONLY)
Abs Immature Granulocytes: 0.02 10*3/uL (ref 0.00–0.07)
Basophils Absolute: 0 10*3/uL (ref 0.0–0.1)
Basophils Relative: 1 %
Eosinophils Absolute: 0 10*3/uL (ref 0.0–0.5)
Eosinophils Relative: 1 %
HCT: 28.1 % — ABNORMAL LOW (ref 36.0–46.0)
Hemoglobin: 9.8 g/dL — ABNORMAL LOW (ref 12.0–15.0)
Immature Granulocytes: 1 %
Lymphocytes Relative: 48 %
Lymphs Abs: 1.4 10*3/uL (ref 0.7–4.0)
MCH: 34.8 pg — ABNORMAL HIGH (ref 26.0–34.0)
MCHC: 34.9 g/dL (ref 30.0–36.0)
MCV: 99.6 fL (ref 80.0–100.0)
Monocytes Absolute: 0.5 10*3/uL (ref 0.1–1.0)
Monocytes Relative: 17 %
Neutro Abs: 0.9 10*3/uL — ABNORMAL LOW (ref 1.7–7.7)
Neutrophils Relative %: 32 %
Platelet Count: 73 10*3/uL — ABNORMAL LOW (ref 150–400)
RBC: 2.82 MIL/uL — ABNORMAL LOW (ref 3.87–5.11)
RDW: 19.9 % — ABNORMAL HIGH (ref 11.5–15.5)
WBC Count: 2.8 10*3/uL — ABNORMAL LOW (ref 4.0–10.5)
nRBC: 0 % (ref 0.0–0.2)

## 2021-08-13 MED ORDER — LEUCOVORIN CALCIUM INJECTION 350 MG
400.0000 mg/m2 | Freq: Once | INTRAVENOUS | Status: AC
  Administered 2021-08-13: 816 mg via INTRAVENOUS
  Filled 2021-08-13: qty 25

## 2021-08-13 MED ORDER — HEPARIN SOD (PORK) LOCK FLUSH 100 UNIT/ML IV SOLN: 500.0000 [IU] | Freq: Once | INTRAVENOUS | Status: DC | PRN

## 2021-08-13 MED ORDER — SODIUM CHLORIDE 0.9% FLUSH
10.0000 mL | Freq: Once | INTRAVENOUS | Status: AC
  Administered 2021-08-13: 10 mL

## 2021-08-13 MED ORDER — PALONOSETRON HCL INJECTION 0.25 MG/5ML
0.2500 mg | Freq: Once | INTRAVENOUS | Status: AC
  Administered 2021-08-13: 0.25 mg via INTRAVENOUS
  Filled 2021-08-13: qty 5

## 2021-08-13 MED ORDER — SODIUM CHLORIDE 0.9 % IV SOLN
10.0000 mg | Freq: Once | INTRAVENOUS | Status: AC
  Administered 2021-08-13: 10 mg via INTRAVENOUS
  Filled 2021-08-13: qty 10

## 2021-08-13 MED ORDER — SODIUM CHLORIDE 0.9 % IV SOLN
2000.0000 mg/m2 | INTRAVENOUS | Status: DC
  Administered 2021-08-13: 4100 mg via INTRAVENOUS
  Filled 2021-08-13: qty 82

## 2021-08-13 MED ORDER — OXALIPLATIN CHEMO INJECTION 100 MG/20ML
70.0000 mg/m2 | Freq: Once | INTRAVENOUS | Status: AC
  Administered 2021-08-13: 145 mg via INTRAVENOUS
  Filled 2021-08-13: qty 20

## 2021-08-13 MED ORDER — SODIUM CHLORIDE 0.9% FLUSH: 10.0000 mL | INTRAVENOUS | Status: DC | PRN

## 2021-08-13 MED ORDER — DEXTROSE 5 % IV SOLN: Freq: Once | INTRAVENOUS | Status: AC

## 2021-08-13 NOTE — Patient Instructions (Addendum)
[facility]  Discharge Instructions: Thank you for choosing [facility] to provide your oncology and hematology care.   If you have a lab appointment with the [facility], please go directly to the [facility] and check in at the registration area.   Wear comfortable clothing and clothing appropriate for easy access to any Portacath or PICC line.   We strive to give you quality time with your provider. You may need to reschedule your appointment if you arrive late (15 or more minutes).  Arriving late affects you and other patients whose appointments are after yours.  Also, if you miss three or more appointments without notifying the office, you may be dismissed from the clinic at the provider's discretion.      For prescription refill requests, have your pharmacy contact our office and allow 72 hours for refills to be completed.    Today you received the following chemotherapy and/or immunotherapy agents: oxaliplatin, leucovorin, and fluorouracil       To help prevent nausea and vomiting after your treatment, we encourage you to take your nausea medication as directed.  BELOW ARE SYMPTOMS THAT SHOULD BE REPORTED IMMEDIATELY: *FEVER GREATER THAN 100.4 F (38 C) OR HIGHER *CHILLS OR SWEATING *NAUSEA AND VOMITING THAT IS NOT CONTROLLED WITH YOUR NAUSEA MEDICATION *UNUSUAL SHORTNESS OF BREATH *UNUSUAL BRUISING OR BLEEDING *URINARY PROBLEMS (pain or burning when urinating, or frequent urination) *BOWEL PROBLEMS (unusual diarrhea, constipation, pain near the anus) TENDERNESS IN MOUTH AND THROAT WITH OR WITHOUT PRESENCE OF ULCERS (sore throat, sores in mouth, or a toothache) UNUSUAL RASH, SWELLING OR PAIN  UNUSUAL VAGINAL DISCHARGE OR ITCHING   Items with * indicate a potential emergency and should be followed up as soon as possible or go to the Emergency Department if any problems should occur.  Please show the CHEMOTHERAPY ALERT CARD or  IMMUNOTHERAPY ALERT CARD at check-in to the Emergency Department and triage nurse.  Should you have questions after your visit or need to cancel or reschedule your appointment, please contact [facility]  Dept: [phone]  and follow the prompts.  Office hours are 8:00 a.m. to 4:30 p.m. Monday - Friday. Please note that voicemails left after 4:00 p.m. may not be returned until the following business day.  We are closed weekends and major holidays. You have access to a nurse at all times for urgent questions. Please call the main number to the clinic Dept: [phone] and follow the prompts.   For any non-urgent questions, you may also contact your provider using MyChart. We now offer e-Visits for anyone 66 and older to request care online for non-urgent symptoms. For details visit [email or web address].   Also download the MyChart app! Go to the app store, search "MyChart", open the app, select [facility], and log in with your MyChart username and password.  Due to Covid, a mask is required upon entering the hospital/clinic. If you do not have a mask, one will be given to you upon arrival. For doctor visits, patients may have 1 support person aged 91 or older with them. For treatment visits, patients cannot have anyone with them due to current Covid guidelines and our immunocompromised population.   The chemotherapy medication bag should finish at 46 hours, 96 hours, or 7 days. For example, if your pump is scheduled for 46 hours and it was put on at 4:00 p.m., it should finish at 2:00 p.m. the day it is scheduled to come  off regardless of your appointment time.     Estimated time to finish at 11:45 on 11/23.   If the display on your pump reads "Low Volume" and it is beeping, take the batteries out of the pump and come to the cancer center for it to be taken off.   If the pump alarms go off prior to the pump reading "Low Volume" then call [phone] and someone  can assist you.  If the plunger comes out and the chemotherapy medication is leaking out, please use your home chemo spill kit to clean up the spill. Do NOT use paper towels or other household products.  If you have problems or questions regarding your pump, please call either [phone] (24 hours a day) or the cancer center Monday-Friday 8:00 a.m.- 4:30 p.m. at the clinic number and we will assist you. If you are unable to get assistance, then go to the nearest Emergency Department and ask the staff to contact the IV team for assistance.

## 2021-08-13 NOTE — Progress Notes (Signed)
Ok to proceed with treatment today with ANC and platelet levels per Cira Rue, NP

## 2021-08-15 ENCOUNTER — Other Ambulatory Visit: Payer: Self-pay

## 2021-08-15 ENCOUNTER — Telehealth: Payer: Self-pay | Admitting: Hematology

## 2021-08-15 ENCOUNTER — Inpatient Hospital Stay: Payer: Medicare Other

## 2021-08-15 VITALS — BP 139/84 | HR 90 | Temp 98.8°F | Resp 20

## 2021-08-15 DIAGNOSIS — C786 Secondary malignant neoplasm of retroperitoneum and peritoneum: Secondary | ICD-10-CM

## 2021-08-15 DIAGNOSIS — D490 Neoplasm of unspecified behavior of digestive system: Secondary | ICD-10-CM

## 2021-08-15 DIAGNOSIS — Z5111 Encounter for antineoplastic chemotherapy: Secondary | ICD-10-CM | POA: Diagnosis not present

## 2021-08-15 MED ORDER — HEPARIN SOD (PORK) LOCK FLUSH 100 UNIT/ML IV SOLN
500.0000 [IU] | Freq: Once | INTRAVENOUS | Status: AC | PRN
  Administered 2021-08-15: 500 [IU]

## 2021-08-15 MED ORDER — SODIUM CHLORIDE 0.9% FLUSH
10.0000 mL | INTRAVENOUS | Status: DC | PRN
  Administered 2021-08-15: 10 mL

## 2021-08-15 MED ORDER — PEGFILGRASTIM-CBQV 6 MG/0.6ML ~~LOC~~ SOSY
6.0000 mg | PREFILLED_SYRINGE | Freq: Once | SUBCUTANEOUS | Status: AC
  Administered 2021-08-15: 6 mg via SUBCUTANEOUS
  Filled 2021-08-15: qty 0.6

## 2021-08-15 NOTE — Telephone Encounter (Signed)
Scheduled follow-up appointments per 11/21 los. Patient is aware. 

## 2021-08-24 MED FILL — Dexamethasone Sodium Phosphate Inj 100 MG/10ML: INTRAMUSCULAR | Qty: 1 | Status: AC

## 2021-08-27 ENCOUNTER — Other Ambulatory Visit: Payer: Self-pay

## 2021-08-27 ENCOUNTER — Encounter: Payer: Self-pay | Admitting: Hematology

## 2021-08-27 ENCOUNTER — Inpatient Hospital Stay: Payer: Medicare Other | Attending: Hematology

## 2021-08-27 ENCOUNTER — Inpatient Hospital Stay (HOSPITAL_BASED_OUTPATIENT_CLINIC_OR_DEPARTMENT_OTHER): Payer: Medicare Other | Admitting: Hematology

## 2021-08-27 ENCOUNTER — Inpatient Hospital Stay: Payer: Medicare Other

## 2021-08-27 VITALS — BP 145/88 | HR 92 | Temp 98.2°F | Resp 18 | Ht 67.0 in | Wt 179.2 lb

## 2021-08-27 DIAGNOSIS — D649 Anemia, unspecified: Secondary | ICD-10-CM | POA: Diagnosis not present

## 2021-08-27 DIAGNOSIS — Z5189 Encounter for other specified aftercare: Secondary | ICD-10-CM | POA: Diagnosis not present

## 2021-08-27 DIAGNOSIS — C801 Malignant (primary) neoplasm, unspecified: Secondary | ICD-10-CM | POA: Diagnosis present

## 2021-08-27 DIAGNOSIS — I700 Atherosclerosis of aorta: Secondary | ICD-10-CM | POA: Diagnosis not present

## 2021-08-27 DIAGNOSIS — C786 Secondary malignant neoplasm of retroperitoneum and peritoneum: Secondary | ICD-10-CM

## 2021-08-27 DIAGNOSIS — R971 Elevated cancer antigen 125 [CA 125]: Secondary | ICD-10-CM | POA: Insufficient documentation

## 2021-08-27 DIAGNOSIS — G629 Polyneuropathy, unspecified: Secondary | ICD-10-CM | POA: Insufficient documentation

## 2021-08-27 DIAGNOSIS — D490 Neoplasm of unspecified behavior of digestive system: Secondary | ICD-10-CM

## 2021-08-27 DIAGNOSIS — C482 Malignant neoplasm of peritoneum, unspecified: Secondary | ICD-10-CM

## 2021-08-27 DIAGNOSIS — Z79899 Other long term (current) drug therapy: Secondary | ICD-10-CM | POA: Diagnosis not present

## 2021-08-27 DIAGNOSIS — Z5111 Encounter for antineoplastic chemotherapy: Secondary | ICD-10-CM | POA: Insufficient documentation

## 2021-08-27 DIAGNOSIS — Z95828 Presence of other vascular implants and grafts: Secondary | ICD-10-CM

## 2021-08-27 LAB — CMP (CANCER CENTER ONLY)
ALT: 8 U/L (ref 0–44)
AST: 18 U/L (ref 15–41)
Albumin: 3.3 g/dL — ABNORMAL LOW (ref 3.5–5.0)
Alkaline Phosphatase: 115 U/L (ref 38–126)
Anion gap: 13 (ref 5–15)
BUN: 9 mg/dL (ref 8–23)
CO2: 21 mmol/L — ABNORMAL LOW (ref 22–32)
Calcium: 9.2 mg/dL (ref 8.9–10.3)
Chloride: 105 mmol/L (ref 98–111)
Creatinine: 0.97 mg/dL (ref 0.44–1.00)
GFR, Estimated: >60 mL/min (ref >60)
Glucose, Bld: 104 mg/dL — ABNORMAL HIGH (ref 70–99)
Potassium: 3.3 mmol/L — ABNORMAL LOW (ref 3.5–5.1)
Sodium: 139 mmol/L (ref 135–145)
Total Bilirubin: 0.4 mg/dL (ref 0.3–1.2)
Total Protein: 8.2 g/dL — ABNORMAL HIGH (ref 6.5–8.1)

## 2021-08-27 LAB — CBC WITH DIFFERENTIAL (CANCER CENTER ONLY)
Abs Immature Granulocytes: 0.35 10*3/uL — ABNORMAL HIGH (ref 0.00–0.07)
Basophils Absolute: 0.1 10*3/uL (ref 0.0–0.1)
Basophils Relative: 1 %
Eosinophils Absolute: 0.1 10*3/uL (ref 0.0–0.5)
Eosinophils Relative: 1 %
HCT: 28.9 % — ABNORMAL LOW (ref 36.0–46.0)
Hemoglobin: 10.1 g/dL — ABNORMAL LOW (ref 12.0–15.0)
Immature Granulocytes: 4 %
Lymphocytes Relative: 24 %
Lymphs Abs: 2 10*3/uL (ref 0.7–4.0)
MCH: 34.9 pg — ABNORMAL HIGH (ref 26.0–34.0)
MCHC: 34.9 g/dL (ref 30.0–36.0)
MCV: 100 fL (ref 80.0–100.0)
Monocytes Absolute: 0.7 10*3/uL (ref 0.1–1.0)
Monocytes Relative: 8 %
Neutro Abs: 5.3 10*3/uL (ref 1.7–7.7)
Neutrophils Relative %: 62 %
Platelet Count: 161 10*3/uL (ref 150–400)
RBC: 2.89 MIL/uL — ABNORMAL LOW (ref 3.87–5.11)
RDW: 17 % — ABNORMAL HIGH (ref 11.5–15.5)
WBC Count: 8.5 10*3/uL (ref 4.0–10.5)
nRBC: 0 % (ref 0.0–0.2)

## 2021-08-27 MED ORDER — SODIUM CHLORIDE 0.9% FLUSH
10.0000 mL | Freq: Once | INTRAVENOUS | Status: AC
  Administered 2021-08-27: 10 mL

## 2021-08-27 MED ORDER — OXALIPLATIN CHEMO INJECTION 100 MG/20ML
70.0000 mg/m2 | Freq: Once | INTRAVENOUS | Status: AC
  Administered 2021-08-27: 145 mg via INTRAVENOUS
  Filled 2021-08-27: qty 20

## 2021-08-27 MED ORDER — DEXAMETHASONE SODIUM PHOSPHATE 100 MG/10ML IJ SOLN
10.0000 mg | Freq: Once | INTRAMUSCULAR | Status: AC
  Administered 2021-08-27: 10 mg via INTRAVENOUS
  Filled 2021-08-27: qty 10

## 2021-08-27 MED ORDER — SODIUM CHLORIDE 0.9% FLUSH: 10.0000 mL | INTRAVENOUS | Status: DC | PRN

## 2021-08-27 MED ORDER — DEXTROSE 5 % IV SOLN: Freq: Once | INTRAVENOUS | Status: AC

## 2021-08-27 MED ORDER — PALONOSETRON HCL INJECTION 0.25 MG/5ML
0.2500 mg | Freq: Once | INTRAVENOUS | Status: AC
  Administered 2021-08-27: 0.25 mg via INTRAVENOUS
  Filled 2021-08-27: qty 5

## 2021-08-27 MED ORDER — SODIUM CHLORIDE 0.9 % IV SOLN
2200.0000 mg/m2 | INTRAVENOUS | Status: DC
  Administered 2021-08-27: 4500 mg via INTRAVENOUS
  Filled 2021-08-27: qty 90

## 2021-08-27 MED ORDER — LEUCOVORIN CALCIUM INJECTION 350 MG
400.0000 mg/m2 | Freq: Once | INTRAVENOUS | Status: AC
  Administered 2021-08-27: 816 mg via INTRAVENOUS
  Filled 2021-08-27: qty 40.8

## 2021-08-27 MED ORDER — HEPARIN SOD (PORK) LOCK FLUSH 100 UNIT/ML IV SOLN: 500.0000 [IU] | Freq: Once | INTRAVENOUS | Status: DC | PRN

## 2021-08-27 NOTE — Patient Instructions (Signed)
[facility]  Discharge Instructions: Thank you for choosing [facility] to provide your oncology and hematology care.   If you have a lab appointment with the [facility], please go directly to the [facility] and check in at the registration area.   Wear comfortable clothing and clothing appropriate for easy access to any Portacath or PICC line.   We strive to give you quality time with your provider. You may need to reschedule your appointment if you arrive late (15 or more minutes).  Arriving late affects you and other patients whose appointments are after yours.  Also, if you miss three or more appointments without notifying the office, you may be dismissed from the clinic at the provider's discretion.      For prescription refill requests, have your pharmacy contact our office and allow 72 hours for refills to be completed.    Today you received the following chemotherapy and/or immunotherapy agents Oxaliplatin, Leucovorin, and 5 FU      To help prevent nausea and vomiting after your treatment, we encourage you to take your nausea medication as directed.  BELOW ARE SYMPTOMS THAT SHOULD BE REPORTED IMMEDIATELY: *FEVER GREATER THAN 100.4 F (38 C) OR HIGHER *CHILLS OR SWEATING *NAUSEA AND VOMITING THAT IS NOT CONTROLLED WITH YOUR NAUSEA MEDICATION *UNUSUAL SHORTNESS OF BREATH *UNUSUAL BRUISING OR BLEEDING *URINARY PROBLEMS (pain or burning when urinating, or frequent urination) *BOWEL PROBLEMS (unusual diarrhea, constipation, pain near the anus) TENDERNESS IN MOUTH AND THROAT WITH OR WITHOUT PRESENCE OF ULCERS (sore throat, sores in mouth, or a toothache) UNUSUAL RASH, SWELLING OR PAIN  UNUSUAL VAGINAL DISCHARGE OR ITCHING   Items with * indicate a potential emergency and should be followed up as soon as possible or go to the Emergency Department if any problems should occur.  Please show the CHEMOTHERAPY ALERT CARD or IMMUNOTHERAPY  ALERT CARD at check-in to the Emergency Department and triage nurse.  Should you have questions after your visit or need to cancel or reschedule your appointment, please contact [facility]  Dept: [phone]  and follow the prompts.  Office hours are 8:00 a.m. to 4:30 p.m. Monday - Friday. Please note that voicemails left after 4:00 p.m. may not be returned until the following business day.  We are closed weekends and major holidays. You have access to a nurse at all times for urgent questions. Please call the main number to the clinic Dept: [phone] and follow the prompts.   For any non-urgent questions, you may also contact your provider using MyChart. We now offer e-Visits for anyone 34 and older to request care online for non-urgent symptoms. For details visit [email or web address].   Also download the MyChart app! Go to the app store, search "MyChart", open the app, select [facility], and log in with your MyChart username and password.  Due to Covid, a mask is required upon entering the hospital/clinic. If you do not have a mask, one will be given to you upon arrival. For doctor visits, patients may have 1 support person aged 64 or older with them. For treatment visits, patients cannot have anyone with them due to current Covid guidelines and our immunocompromised population.

## 2021-08-27 NOTE — Progress Notes (Signed)
[place]   Telephone:([phone]) [phone] Fax:(336) [phone]   Clinic Follow up Note   Patient Care Team: Pcp, No as PCP - General  Date of Service:  08/27/2021  CHIEF COMPLAINT: f/u of peritoneal carcinomatosis  CURRENT THERAPY:  First line FOLFOX q2 weeks, starting 05/04/21  ASSESSMENT & PLAN:  Brenda Stewart is a 70 y.o. female with   1. 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 CT AP 04/21/21 showed findings suspicious for diffuse peritoneal carcinomatosis. She was admitted and underwent paracentesis on 04/24/21 showing atypical cells. -Diagnostic laparoscopy on 04/30/21 with pathology from peritoneum biopsy showing metastatic carcinoma with signet ring cell features. IHC supports upper GI primary.  -Her EGD was negative for primary malignancy. -her cancer is PD-L1 negative and MMR normal -she began First line FOLFOX on 05/04/21, tolerating very well overall. Dose reduced from C6 due to thrombocytopenia. -CT CAP from 08/10/21 showed decreased peritoneal thickening/stranding, resolution of ascites, and reduced size of inferior right liver implant.  No new lesion or evidence of progression.  She has responded well to treatment.  -Continue FOLFOX every 2 weeks at reduced dose for now -labs results still pending. We will proceed with the same dose as last cycle if lab is adequate.   2. Small bowel obstruction, secondary to peritoneal carcinomatosis, Protein calorie malnutrition -she was started on TPN in the hospital, will continue  -continues on soft diet, supplementing with Boost.  -she is now off TPN. She is eating well and weight is stable. -f/u with dietician    3. Mild Anemia -found to have anemia while in the hospital -stable   4. 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   Suspected distal small bowel obstruction, with probable transition point in right lower quadrant.   Aortic Atherosclerosis (ICD10-I70.0).   04/22/2021 Imaging   US Abdomen  IMPRESSION: Small volume of lower abdominopelvic ascites.   04/24/2021 Pathology Results   Cytology  Specimen Submitted:  A. ASCITES, PARACENTESIS:   FINAL MICROSCOPIC DIAGNOSIS:  - Atypical cells present  DIAGNOSTIC COMMENTS:  Immunohistochemical stains show that the rare, mildly atypical cells are positive for calretinin and D2-40; while they are negative for PAX8, ER and MOC-31.  This immunoprofile is consistent with reactive mesothelial cells.    04/25/2021 Initial Diagnosis   Primary peritoneal carcinomatosis ([place])   04/29/2021 Imaging   CT AP  IMPRESSION: 1. Moderate ascites, mild peritoneal thickening and diffuse omental thickening/caking and smaller focal fluid/cystic areas within the RIGHT abdomen and LOWER pelvis, compatible with peritoneal carcinomatosis. Mild mesenteric stranding is nonspecific. 2. Small subserosal implants along the INFERIOR RIGHT liver. 3. Small LEFT pleural effusion and trace RIGHT pleural effusion with LEFT LOWER lung atelectasis. 4. Aortic Atherosclerosis (ICD10-I70.0).   04/30/2021 Pathology Results   FINAL MICROSCOPIC  DIAGNOSIS:   A. PERITONEUM, BIOPSY:  -  Metastatic carcinoma with signet ring cell features  -  See  comment   B. 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   ADDENDUM:  By immunohistochemistry, HER-2 is EQUIVOCAL (2+).    FLOURESCENCE IN-SITU HYBRIDIZATION RESULTS:  GROUP 5:   HER2 **NEGATIVE**    05/04/2021 -  Chemotherapy   Patient is on Treatment Plan : GASTRIC FOLFOX q14d x 12 cycles     05/04/2021 Imaging   CT Chest  IMPRESSION: 1. Suspected segmental filling defects in the anterior segment right upper lobe and apical segment right upper lobe compatible with small pulmonary emboli. Clot burden appears small, although please note that today's examination was not a dedicated CT angiogram and as such might overestimate or underestimate pulmonary embolus. Positive for acute PE with CT evidence of right heart strain (RV/LV Ratio = 1.1) consistent with at least submassive (intermediate risk) PE. The presence of right heart strain has been associated with an increased risk of morbidity and mortality. Please refer to the "PE Focused" order set in EPIC. 2. No compelling findings of metastatic disease to the chest. There is a small left pleural effusion, but no definite enhancement along the pleura to suggest malignant or exudative etiology at this time. 3. There is some new gas in the soft tissues of the left thoracoabdominal wall, compatible subcutaneous emphysema, probably related to recent laparoscopy or other procedure. 4. Hypodense thyroid nodules up to 2.1 cm in diameter. Recommend thyroid US (ref: J Am Coll Radiol. 2015 Feb;12(2): 143-50). 5. Left upper quadrant ascites, including fluid in the lesser  sac. This is reduced compared to the prior CT abdomen of 04/29/2021. 6. Stable appearance of periportal edema and hypodensities along the posterior margin of the right hepatic lobe.   05/07/2021 Pathology Results   FINAL MICROSCOPIC DIAGNOSIS:   A. DUODENUM, BULB, BIOPSY:  - Polypoid duodenal mucosa with chronic inflammation, foveolar  metaplasia and reactive changes  - Negative for dysplasia or malignancy   Cytology: Specimen Submitted:  A. ESOPHAGUS, BRUSHING:   FINAL MICROSCOPIC DIAGNOSIS:  - Benign reactive/reparative changes    05/16/2021 Cancer Staging   Staging form: Soft Tissue Sarcoma of the Abdomen and Thoracic Visceral Organs, AJCC 8th Edition - Clinical: cTX, cN0, pM1 - Signed by [name], MD on 05/16/2021    08/10/2021 Imaging   CT CAP IMPRESSION: 1. Subserosal implants along the inferior right liver are no longer apparent or decreased in size when compared to prior exam. 2. Interval resolution of abdominal ascites. Decreased peritoneal thickening and stranding. 3. Interval resolution of small left pleural effusion. 4.  Aortic Atherosclerosis (ICD10-I70.0).        INTERVAL HISTORY:  Brenda Stewart is here for a follow up of peritoneal carcinomatosis. She was last seen by NP Lacie on 08/13/21. She presents to the clinic alone. She notes her daughter is driving her here, as she does not currently have a car. She reports she continues to do well overall. She denies any new neuropathy (has in her feet at baseline). She reports she is eating well, her energy is  good, and she denies any GI issues.   All other systems were reviewed with the patient and are negative.  MEDICAL HISTORY:  Past Medical History:  Diagnosis Date   Acid reflux    Hypertension     SURGICAL HISTORY: Past Surgical History:  Procedure Laterality Date   BIOPSY  05/07/2021   Procedure: BIOPSY;  Surgeon: [name], MD;  Location: [facility];  Service: Endoscopy;;   BUNIONECTOMY  Bilateral    ESOPHAGEAL BRUSHING  05/07/2021   Procedure: ESOPHAGEAL BRUSHING;  Surgeon: [name], MD;  Location: [facility];  Service: Endoscopy;;   ESOPHAGOGASTRODUODENOSCOPY (EGD) WITH PROPOFOL N/A 05/07/2021   Procedure: ESOPHAGOGASTRODUODENOSCOPY (EGD) WITH PROPOFOL;  Surgeon: [name], MD;  Location: [facility];  Service: Endoscopy;  Laterality: N/A;   HAND TENDON SURGERY Bilateral    IR IMAGING GUIDED PORT INSERTION  04/26/2021   IR PARACENTESIS  04/24/2021   LAPAROSCOPY N/A 04/30/2021   Procedure: LAPAROSCOPY DIAGNOSTIC WITH BIOPSIES;  Surgeon: Lafonda Mosses, MD;  Location: [facility];  Service: Gynecology;  Laterality: N/A;   PARTIAL HYSTERECTOMY      I have reviewed the social history and family history with the patient and they are unchanged from previous note.  ALLERGIES:  has No Known Allergies.  MEDICATIONS:  Current Outpatient Medications  Medication Sig Dispense Refill   apixaban (ELIQUIS) 5 MG TABS tablet Take 1 tablet (5 mg total) by mouth 2 (two) times daily. 60 tablet 2   ascorbic acid (VITAMIN C) 250 MG CHEW Chew 500 mg by mouth daily.     Cholecalciferol (VITAMIN D3) 50 MCG (2000 UT) CHEW Chew 2 tablets by mouth daily.     folic acid (FOLVITE) 1 MG tablet Take 1 tablet (1 mg total) by mouth daily. 30 tablet 3   lidocaine-prilocaine (EMLA) cream Apply 1 application topically as needed. 30 g 0   omeprazole (PRILOSEC) 20 MG capsule Take 20 mg by mouth daily as needed (heartburn/acid reflux).     prochlorperazine (COMPAZINE) 10 MG tablet Take 1 tablet (10 mg total) by mouth every 6 (six) hours as needed for nausea or vomiting. 30 tablet 2   sennosides-docusate sodium (SENOKOT-S) 8.6-50 MG tablet Take 2 tablets by mouth daily as needed for constipation.     No current facility-administered medications for this visit.    PHYSICAL EXAMINATION: ECOG PERFORMANCE STATUS: 0 - Asymptomatic  Vitals:   08/27/21 1116  BP: (!) 145/88  Pulse: 92  Resp: 18  Temp:  98.2 F (36.8 C)  SpO2: 100%   Wt Readings from Last 3 Encounters:  08/27/21 179 lb 3.2 oz (81.3 kg)  08/13/21 184 lb 2 oz (83.5 kg)  07/30/21 182 lb 3.2 oz (82.6 kg)     GENERAL:alert, no distress and comfortable SKIN: skin color normal, no rashes or significant lesions EYES: normal, Conjunctiva are pink and non-injected, sclera clear  NEURO: alert & oriented x 3 with fluent speech  LABORATORY DATA:  I have reviewed the data as listed CBC Latest Ref Rng & Units 08/13/2021 08/06/2021 07/30/2021  WBC 4.0 - 10.5 K/uL 2.8(L) 3.3(L) 3.9(L)  Hemoglobin 12.0 - 15.0 g/dL 9.8(L) 9.7(L) 9.9(L)  Hematocrit 36.0 - 46.0 % 28.1(L) 28.7(L) 29.0(L)  Platelets 150 - 400 K/uL 73(L) 155 86(L)     CMP Latest Ref Rng & Units 08/13/2021 08/06/2021 07/30/2021  Glucose 70 - 99 mg/dL 116(H) 109(H) 109(H)  BUN 8 - 23 mg/dL '11 13 16  ' Creatinine 0.44 - 1.00 mg/dL 0.82 0.89 1.02(H)  Sodium 135 -  145 mmol/L 141 138 138  Potassium 3.5 - 5.1 mmol/L 3.5 3.4(L) 3.6  Chloride 98 - 111 mmol/L 108 104 103  CO2 22 - 32 mmol/L '22 24 24  ' Calcium 8.9 - 10.3 mg/dL 8.9 9.0 9.4  Total Protein 6.5 - 8.1 g/dL 7.9 7.9 7.9  Total Bilirubin 0.3 - 1.2 mg/dL 0.3 0.5 0.7  Alkaline Phos 38 - 126 U/L 100 85 98  AST 15 - 41 U/L '24 23 25  ' ALT 0 - 44 U/L '11 9 11      ' RADIOGRAPHIC STUDIES: I have personally reviewed the radiological images as listed and agreed with the findings in the report. No results found.    No orders of the defined types were placed in this encounter.  All questions were answered. The patient knows to call the clinic with any problems, questions or concerns. No barriers to learning was detected. The total time spent in the appointment was 30 minutes.     [name], MD 08/27/2021   I, Wilburn Mylar, am acting as scribe for [name], MD.   I have reviewed the above documentation for accuracy and completeness, and I agree with the above.

## 2021-08-27 NOTE — Progress Notes (Signed)
Per Dr Burr Medico, give 5FU pump 2200mg /m2

## 2021-08-28 ENCOUNTER — Telehealth: Payer: Self-pay | Admitting: Hematology

## 2021-08-28 LAB — CA 125: Cancer Antigen (CA) 125: 9.5 U/mL (ref 0.0–38.1)

## 2021-08-28 NOTE — Telephone Encounter (Signed)
Left message with follow-up appointments per 12/5 los. 

## 2021-08-29 ENCOUNTER — Inpatient Hospital Stay: Payer: Medicare Other

## 2021-08-29 ENCOUNTER — Other Ambulatory Visit: Payer: Self-pay

## 2021-08-29 VITALS — BP 121/70 | HR 98 | Temp 98.9°F | Resp 18

## 2021-08-29 DIAGNOSIS — D490 Neoplasm of unspecified behavior of digestive system: Secondary | ICD-10-CM

## 2021-08-29 DIAGNOSIS — Z95828 Presence of other vascular implants and grafts: Secondary | ICD-10-CM

## 2021-08-29 DIAGNOSIS — C786 Secondary malignant neoplasm of retroperitoneum and peritoneum: Secondary | ICD-10-CM

## 2021-08-29 DIAGNOSIS — Z5111 Encounter for antineoplastic chemotherapy: Secondary | ICD-10-CM | POA: Diagnosis not present

## 2021-08-29 MED ORDER — HEPARIN SOD (PORK) LOCK FLUSH 100 UNIT/ML IV SOLN
500.0000 [IU] | Freq: Once | INTRAVENOUS | Status: AC
  Administered 2021-08-29: 500 [IU]

## 2021-08-29 MED ORDER — SODIUM CHLORIDE 0.9% FLUSH
10.0000 mL | Freq: Once | INTRAVENOUS | Status: AC
  Administered 2021-08-29: 10 mL

## 2021-08-29 MED ORDER — PEGFILGRASTIM-CBQV 6 MG/0.6ML ~~LOC~~ SOSY
6.0000 mg | PREFILLED_SYRINGE | Freq: Once | SUBCUTANEOUS | Status: AC
  Administered 2021-08-29: 6 mg via SUBCUTANEOUS
  Filled 2021-08-29: qty 0.6

## 2021-08-30 ENCOUNTER — Telehealth: Payer: Self-pay | Admitting: Hematology

## 2021-08-30 NOTE — Telephone Encounter (Signed)
Rescheduled upcoming appointments due to provider's breast clinic. Patient is aware of changes. Mailed calendar.

## 2021-09-11 ENCOUNTER — Inpatient Hospital Stay (HOSPITAL_BASED_OUTPATIENT_CLINIC_OR_DEPARTMENT_OTHER): Payer: Medicare Other | Admitting: Hematology

## 2021-09-11 ENCOUNTER — Encounter: Payer: Self-pay | Admitting: Hematology

## 2021-09-11 ENCOUNTER — Other Ambulatory Visit: Payer: Self-pay

## 2021-09-11 ENCOUNTER — Inpatient Hospital Stay: Payer: Medicare Other

## 2021-09-11 VITALS — BP 117/68 | HR 88 | Temp 98.0°F | Resp 18 | Ht 67.0 in | Wt 177.2 lb

## 2021-09-11 DIAGNOSIS — C482 Malignant neoplasm of peritoneum, unspecified: Secondary | ICD-10-CM

## 2021-09-11 DIAGNOSIS — C786 Secondary malignant neoplasm of retroperitoneum and peritoneum: Secondary | ICD-10-CM | POA: Diagnosis not present

## 2021-09-11 DIAGNOSIS — Z95828 Presence of other vascular implants and grafts: Secondary | ICD-10-CM

## 2021-09-11 DIAGNOSIS — Z5111 Encounter for antineoplastic chemotherapy: Secondary | ICD-10-CM | POA: Diagnosis not present

## 2021-09-11 DIAGNOSIS — D490 Neoplasm of unspecified behavior of digestive system: Secondary | ICD-10-CM

## 2021-09-11 LAB — CBC WITH DIFFERENTIAL (CANCER CENTER ONLY)
Abs Immature Granulocytes: 0.12 10*3/uL — ABNORMAL HIGH (ref 0.00–0.07)
Basophils Absolute: 0.1 10*3/uL (ref 0.0–0.1)
Basophils Relative: 1 %
Eosinophils Absolute: 0.1 10*3/uL (ref 0.0–0.5)
Eosinophils Relative: 2 %
HCT: 30.2 % — ABNORMAL LOW (ref 36.0–46.0)
Hemoglobin: 10.3 g/dL — ABNORMAL LOW (ref 12.0–15.0)
Immature Granulocytes: 2 %
Lymphocytes Relative: 30 %
Lymphs Abs: 1.7 10*3/uL (ref 0.7–4.0)
MCH: 34.3 pg — ABNORMAL HIGH (ref 26.0–34.0)
MCHC: 34.1 g/dL (ref 30.0–36.0)
MCV: 100.7 fL — ABNORMAL HIGH (ref 80.0–100.0)
Monocytes Absolute: 0.8 10*3/uL (ref 0.1–1.0)
Monocytes Relative: 14 %
Neutro Abs: 2.8 10*3/uL (ref 1.7–7.7)
Neutrophils Relative %: 51 %
Platelet Count: 138 10*3/uL — ABNORMAL LOW (ref 150–400)
RBC: 3 MIL/uL — ABNORMAL LOW (ref 3.87–5.11)
RDW: 17 % — ABNORMAL HIGH (ref 11.5–15.5)
WBC Count: 5.5 10*3/uL (ref 4.0–10.5)
nRBC: 0 % (ref 0.0–0.2)

## 2021-09-11 LAB — CMP (CANCER CENTER ONLY)
ALT: 9 U/L (ref 0–44)
AST: 19 U/L (ref 15–41)
Albumin: 3.7 g/dL (ref 3.5–5.0)
Alkaline Phosphatase: 114 U/L (ref 38–126)
Anion gap: 9 (ref 5–15)
BUN: 13 mg/dL (ref 8–23)
CO2: 25 mmol/L (ref 22–32)
Calcium: 9.6 mg/dL (ref 8.9–10.3)
Chloride: 105 mmol/L (ref 98–111)
Creatinine: 0.87 mg/dL (ref 0.44–1.00)
GFR, Estimated: >60 mL/min (ref >60)
Glucose, Bld: 118 mg/dL — ABNORMAL HIGH (ref 70–99)
Potassium: 3.7 mmol/L (ref 3.5–5.1)
Sodium: 139 mmol/L (ref 135–145)
Total Bilirubin: 0.4 mg/dL (ref 0.3–1.2)
Total Protein: 7.8 g/dL (ref 6.5–8.1)

## 2021-09-11 MED ORDER — PALONOSETRON HCL INJECTION 0.25 MG/5ML
0.2500 mg | Freq: Once | INTRAVENOUS | Status: AC
  Administered 2021-09-11: 12:00:00 0.25 mg via INTRAVENOUS
  Filled 2021-09-11: qty 5

## 2021-09-11 MED ORDER — DEXTROSE 5 % IV SOLN: Freq: Once | INTRAVENOUS | Status: AC

## 2021-09-11 MED ORDER — SODIUM CHLORIDE 0.9 % IV SOLN
10.0000 mg | Freq: Once | INTRAVENOUS | Status: AC
  Administered 2021-09-11: 12:00:00 10 mg via INTRAVENOUS
  Filled 2021-09-11: qty 10

## 2021-09-11 MED ORDER — LEUCOVORIN CALCIUM INJECTION 350 MG
400.0000 mg/m2 | Freq: Once | INTRAVENOUS | Status: AC
  Administered 2021-09-11: 13:00:00 816 mg via INTRAVENOUS
  Filled 2021-09-11: qty 40.8

## 2021-09-11 MED ORDER — SODIUM CHLORIDE 0.9 % IV SOLN
2200.0000 mg/m2 | INTRAVENOUS | Status: DC
  Administered 2021-09-11: 15:00:00 4500 mg via INTRAVENOUS
  Filled 2021-09-11: qty 90

## 2021-09-11 MED ORDER — SODIUM CHLORIDE 0.9% FLUSH
10.0000 mL | INTRAVENOUS | Status: DC | PRN
  Administered 2021-09-11: 15:00:00 10 mL

## 2021-09-11 MED ORDER — OXALIPLATIN CHEMO INJECTION 100 MG/20ML
50.0000 mg/m2 | Freq: Once | INTRAVENOUS | Status: AC
  Administered 2021-09-11: 13:00:00 100 mg via INTRAVENOUS
  Filled 2021-09-11: qty 20

## 2021-09-11 MED ORDER — SODIUM CHLORIDE 0.9% FLUSH
10.0000 mL | Freq: Once | INTRAVENOUS | Status: AC
  Administered 2021-09-11: 11:00:00 10 mL

## 2021-09-11 NOTE — Progress Notes (Signed)
[place]   [phone] Fax:([phone]   Clinic Follow up Note   Patient Care Team: Pcp, No as PCP - General  Date of Service:  09/11/2021  CHIEF COMPLAINT: f/u of peritoneal carcinomatosis  CURRENT THERAPY:  First line FOLFOX q2 weeks, starting 05/04/21  ASSESSMENT & PLAN:  Brenda Stewart is a 70 y.o. female with   1. 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 CT AP 04/21/21 showed findings suspicious for diffuse peritoneal carcinomatosis. She was admitted and underwent paracentesis on 04/24/21 showing atypical cells. -Diagnostic laparoscopy on 04/30/21 with pathology from peritoneum biopsy showing metastatic carcinoma with signet ring cell features. IHC supports upper GI primary.  -Her EGD was negative for primary malignancy. -her cancer is PD-L1 negative and MMR normal -she began First line FOLFOX on 05/04/21, tolerating very well overall. Dose reduced from C6 due to thrombocytopenia. -CT CAP from 08/10/21 showed decreased peritoneal thickening/stranding, resolution of ascites, and reduced size of inferior right liver implant.  No new lesion or evidence of progression.  She has responded well to treatment.  -Continue FOLFOX every 2 weeks at reduced dose for now due to neuropathy     2. Small bowel obstruction, secondary to peritoneal carcinomatosis, Protein calorie malnutrition -she was started on TPN in the hospital, will continue  -continues on soft diet, supplementing with Boost.  -she is now off TPN. She is eating well and weight is stable. -f/u with dietician    3. Mild Anemia -found to have anemia while in the hospital -improving, hgb 10.3 today (09/11/21)   4. 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   Suspected distal small bowel obstruction, with probable transition point in right lower quadrant.   Aortic Atherosclerosis (ICD10-I70.0).   04/22/2021 Imaging   US Abdomen  IMPRESSION: Small volume of lower abdominopelvic ascites.   04/24/2021 Pathology Results   Cytology  Specimen Submitted:  A. ASCITES, PARACENTESIS:   FINAL MICROSCOPIC DIAGNOSIS:  - Atypical cells present  DIAGNOSTIC COMMENTS:  Immunohistochemical stains show that the rare, mildly atypical cells are positive for calretinin and D2-40; while they are negative for PAX8, ER and MOC-31.  This immunoprofile is consistent with reactive mesothelial cells.    04/25/2021 Initial Diagnosis   Primary peritoneal carcinomatosis ([place])   04/29/2021 Imaging   CT AP  IMPRESSION: 1. Moderate ascites, mild peritoneal thickening and diffuse omental thickening/caking and smaller focal fluid/cystic areas within the RIGHT abdomen and LOWER pelvis, compatible with peritoneal carcinomatosis. Mild mesenteric stranding is nonspecific. 2. Small subserosal implants along the INFERIOR RIGHT liver. 3. Small LEFT pleural effusion and trace RIGHT pleural effusion with LEFT LOWER lung atelectasis. 4. Aortic Atherosclerosis (ICD10-I70.0).   04/30/2021 Pathology Results  FINAL MICROSCOPIC DIAGNOSIS:   A. PERITONEUM, BIOPSY:  -  Metastatic carcinoma with signet ring cell  features  -  See comment   B. 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   ADDENDUM:  By immunohistochemistry, HER-2 is EQUIVOCAL (2+).    FLOURESCENCE IN-SITU HYBRIDIZATION RESULTS:  GROUP 5:   HER2 **NEGATIVE**    05/04/2021 -  Chemotherapy   Patient is on Treatment Plan : GASTRIC FOLFOX q14d x 12 cycles     05/04/2021 Imaging   CT Chest  IMPRESSION: 1. Suspected segmental filling defects in the anterior segment right upper lobe and apical segment right upper lobe compatible with small pulmonary emboli. Clot burden appears small, although please note that today's examination was not a dedicated CT angiogram and as such might overestimate or underestimate pulmonary embolus. Positive for acute PE with CT evidence of right heart strain (RV/LV Ratio = 1.1) consistent with at least submassive (intermediate risk) PE. The presence of right heart strain has been associated with an increased risk of morbidity and mortality. Please refer to the "PE Focused" order set in EPIC. 2. No compelling findings of metastatic disease to the chest. There is a small left pleural effusion, but no definite enhancement along the pleura to suggest malignant or exudative etiology at this time. 3. There is some new gas in the soft tissues of the left thoracoabdominal wall, compatible subcutaneous emphysema, probably related to recent laparoscopy or other procedure. 4. Hypodense thyroid nodules up to 2.1 cm in diameter. Recommend thyroid US (ref: J Am Coll Radiol. 2015 Feb;12(2): 143-50). 5. Left upper quadrant ascites, including  fluid in the lesser sac. This is reduced compared to the prior CT abdomen of 04/29/2021. 6. Stable appearance of periportal edema and hypodensities along the posterior margin of the right hepatic lobe.   05/07/2021 Pathology Results   FINAL MICROSCOPIC DIAGNOSIS:   A. DUODENUM, BULB, BIOPSY:  - Polypoid duodenal mucosa with chronic inflammation, foveolar  metaplasia and reactive changes  - Negative for dysplasia or malignancy   Cytology: Specimen Submitted:  A. ESOPHAGUS, BRUSHING:   FINAL MICROSCOPIC DIAGNOSIS:  - Benign reactive/reparative changes    05/16/2021 Cancer Staging   Staging form: Soft Tissue Sarcoma of the Abdomen and Thoracic Visceral Organs, AJCC 8th Edition - Clinical: cTX, cN0, pM1 - Signed by [name], MD on 05/16/2021    08/10/2021 Imaging   CT CAP IMPRESSION: 1. Subserosal implants along the inferior right liver are no longer apparent or decreased in size when compared to prior exam. 2. Interval resolution of abdominal ascites. Decreased peritoneal thickening and stranding. 3. Interval resolution of small left pleural effusion. 4.  Aortic Atherosclerosis (ICD10-I70.0).        INTERVAL HISTORY:  BENNETTE HASTY is here for a follow up of peritoneal carcinomatosis. She was last seen by me on 08/27/21. She presents to the clinic alone. She reports she is doing well overall with no new concerns. She notes her cold sensitivity is good, and she denies numbness or tingling. She reports her appetite is good. She does note some darkening of her nails.   All other systems  were reviewed with the patient and are negative.  MEDICAL HISTORY:  Past Medical History:  Diagnosis Date   Acid reflux    Hypertension     SURGICAL HISTORY: Past Surgical History:  Procedure Laterality Date   BIOPSY  05/07/2021   Procedure: BIOPSY;  Surgeon: [name], MD;  Location: [facility];  Service: Endoscopy;;   BUNIONECTOMY Bilateral    ESOPHAGEAL BRUSHING  05/07/2021    Procedure: ESOPHAGEAL BRUSHING;  Surgeon: [name], MD;  Location: [facility];  Service: Endoscopy;;   ESOPHAGOGASTRODUODENOSCOPY (EGD) WITH PROPOFOL N/A 05/07/2021   Procedure: ESOPHAGOGASTRODUODENOSCOPY (EGD) WITH PROPOFOL;  Surgeon: [name], MD;  Location: [facility];  Service: Endoscopy;  Laterality: N/A;   HAND TENDON SURGERY Bilateral    IR IMAGING GUIDED PORT INSERTION  04/26/2021   IR PARACENTESIS  04/24/2021   LAPAROSCOPY N/A 04/30/2021   Procedure: LAPAROSCOPY DIAGNOSTIC WITH BIOPSIES;  Surgeon: Lafonda Mosses, MD;  Location: [facility];  Service: Gynecology;  Laterality: N/A;   PARTIAL HYSTERECTOMY      I have reviewed the social history and family history with the patient and they are unchanged from previous note.  ALLERGIES:  has No Known Allergies.  MEDICATIONS:  Current Outpatient Medications  Medication Sig Dispense Refill   amLODipine (NORVASC) 10 MG tablet Take 5 mg by mouth daily. Take 1 tab by mouth daily (Hold Dose for Systolic BP Less than 220; Avoid Grapefruit or its juice)     lisinopril (ZESTRIL) 40 MG tablet Take 40 mg by mouth daily. TAKE 1 TAB BY MOUTH DAILY (HOLD DOSE FOR SYSTOLIC BLOOD PRESSURE LESS THAN 100; AVOID POTASSIUM SUPPLEMENT/ARTIFICIAL SALT) (HOLD DOSE FOR SYSTOLIC BLOOD PRESSURE LESS THAN 100; AVOID POTASSIUM SUPPLEMENTS/ARTIFICIAL SALT)     apixaban (ELIQUIS) 5 MG TABS tablet Take 1 tablet (5 mg total) by mouth 2 (two) times daily. 60 tablet 2   ascorbic acid (VITAMIN C) 250 MG CHEW Chew 500 mg by mouth daily.     Cholecalciferol (VITAMIN D3) 50 MCG (2000 UT) CHEW Chew 2 tablets by mouth daily.     lidocaine-prilocaine (EMLA) cream Apply 1 application topically as needed. 30 g 0   omeprazole (PRILOSEC) 20 MG capsule Take 20 mg by mouth daily as needed (heartburn/acid reflux).     prochlorperazine (COMPAZINE) 10 MG tablet Take 1 tablet (10 mg total) by mouth every 6 (six) hours as needed for nausea or vomiting. 30 tablet 2    sennosides-docusate sodium (SENOKOT-S) 8.6-50 MG tablet Take 2 tablets by mouth daily as needed for constipation.     No current facility-administered medications for this visit.   Facility-Administered Medications Ordered in Other Visits  Medication Dose Route Frequency Provider Last Rate Last Admin   fluorouracil (ADRUCIL) 4,500 mg in sodium chloride 0.9 % 60 mL chemo infusion  2,200 mg/m2 (Treatment Plan Recorded) Intravenous 1 day or 1 dose [name], MD   4,500 mg at 09/11/21 1500   sodium chloride flush (NS) 0.9 % injection 10 mL  10 mL Intracatheter PRN [name], MD   10 mL at 09/11/21 1456    PHYSICAL EXAMINATION: ECOG PERFORMANCE STATUS: 1 - Symptomatic but completely ambulatory  Vitals:   09/11/21 1125  BP: 117/68  Pulse: 88  Resp: 18  Temp: 98 F (36.7 C)  SpO2: 99%   Wt Readings from Last 3 Encounters:  09/11/21 177 lb 3.2 oz (80.4 kg)  08/27/21 179 lb 3.2 oz (81.3 kg)  08/13/21 184 lb 2 oz (83.5 kg)  GENERAL:alert, no distress and comfortable SKIN: skin color normal, no rashes or significant lesions EYES: normal, Conjunctiva are pink and non-injected, sclera clear  NEURO: alert & oriented x 3 with fluent speech  LABORATORY DATA:  I have reviewed the data as listed CBC Latest Ref Rng & Units 09/11/2021 08/27/2021 08/13/2021  WBC 4.0 - 10.5 K/uL 5.5 8.5 2.8(L)  Hemoglobin 12.0 - 15.0 g/dL 10.3(L) 10.1(L) 9.8(L)  Hematocrit 36.0 - 46.0 % 30.2(L) 28.9(L) 28.1(L)  Platelets 150 - 400 K/uL 138(L) 161 73(L)     CMP Latest Ref Rng & Units 09/11/2021 08/27/2021 08/13/2021  Glucose 70 - 99 mg/dL 118(H) 104(H) 116(H)  BUN 8 - 23 mg/dL '13 9 11  ' Creatinine 0.44 - 1.00 mg/dL 0.87 0.97 0.82  Sodium 135 - 145 mmol/L 139 139 141  Potassium 3.5 - 5.1 mmol/L 3.7 3.3(L) 3.5  Chloride 98 - 111 mmol/L 105 105 108  CO2 22 - 32 mmol/L 25 21(L) 22  Calcium 8.9 - 10.3 mg/dL 9.6 9.2 8.9  Total Protein 6.5 - 8.1 g/dL 7.8 8.2(H) 7.9  Total Bilirubin 0.3 - 1.2 mg/dL 0.4 0.4 0.3   Alkaline Phos 38 - 126 U/L 114 115 100  AST 15 - 41 U/L '19 18 24  ' ALT 0 - 44 U/L '9 8 11      ' RADIOGRAPHIC STUDIES: I have personally reviewed the radiological images as listed and agreed with the findings in the report. No results found.    No orders of the defined types were placed in this encounter.  All questions were answered. The patient knows to call the clinic with any problems, questions or concerns. No barriers to learning was detected. The total time spent in the appointment was 30 minutes.     [name], MD 09/11/2021   I, Wilburn Mylar, am acting as scribe for [name], MD.   I have reviewed the above documentation for accuracy and completeness, and I agree with the above.

## 2021-09-11 NOTE — Patient Instructions (Addendum)
[facility]  Discharge Instructions: Thank you for choosing [facility] to provide your oncology and hematology care.   If you have a lab appointment with the [facility], please go directly to the [facility] and check in at the registration area.   Wear comfortable clothing and clothing appropriate for easy access to any Portacath or PICC line.   We strive to give you quality time with your provider. You may need to reschedule your appointment if you arrive late (15 or more minutes).  Arriving late affects you and other patients whose appointments are after yours.  Also, if you miss three or more appointments without notifying the office, you may be dismissed from the clinic at the providers discretion.      For prescription refill requests, have your pharmacy contact our office and allow 72 hours for refills to be completed.    Today you received the following chemotherapy and/or immunotherapy agents: Oxaliplatin, Leucovorin, & Fluorouracil   To help prevent nausea and vomiting after your treatment, we encourage you to take your nausea medication as directed.  BELOW ARE SYMPTOMS THAT SHOULD BE REPORTED IMMEDIATELY: *FEVER GREATER THAN 100.4 F (38 C) OR HIGHER *CHILLS OR SWEATING *NAUSEA AND VOMITING THAT IS NOT CONTROLLED WITH YOUR NAUSEA MEDICATION *UNUSUAL SHORTNESS OF BREATH *UNUSUAL BRUISING OR BLEEDING *URINARY PROBLEMS (pain or burning when urinating, or frequent urination) *BOWEL PROBLEMS (unusual diarrhea, constipation, pain near the anus) TENDERNESS IN MOUTH AND THROAT WITH OR WITHOUT PRESENCE OF ULCERS (sore throat, sores in mouth, or a toothache) UNUSUAL RASH, SWELLING OR PAIN  UNUSUAL VAGINAL DISCHARGE OR ITCHING   Items with * indicate a potential emergency and should be followed up as soon as possible or go to the Emergency Department if any problems should occur.  Please show the CHEMOTHERAPY ALERT CARD or IMMUNOTHERAPY  ALERT CARD at check-in to the Emergency Department and triage nurse.  Should you have questions after your visit or need to cancel or reschedule your appointment, please contact [facility]  Dept: [phone]  and follow the prompts.  Office hours are 8:00 a.m. to 4:30 p.m. Monday - Friday. Please note that voicemails left after 4:00 p.m. may not be returned until the following business day.  We are closed weekends and major holidays. You have access to a nurse at all times for urgent questions. Please call the main number to the clinic Dept: [phone] and follow the prompts.   For any non-urgent questions, you may also contact your provider using MyChart. We now offer e-Visits for anyone 78 and older to request care online for non-urgent symptoms. For details visit [email or web address].   Also download the MyChart app! Go to the app store, search "MyChart", open the app, select [facility], and log in with your MyChart username and password.  Due to Covid, a mask is required upon entering the hospital/clinic. If you do not have a mask, one will be given to you upon arrival. For doctor visits, patients may have 1 support person aged 98 or older with them. For treatment visits, patients cannot have anyone with them due to current Covid guidelines and our immunocompromised population.   The chemotherapy medication bag should finish at 46 hours, 96 hours, or 7 days. For example, if your pump is scheduled for 46 hours and it was put on at 4:00 p.m., it should finish at 2:00 p.m. the day it is scheduled to come off regardless of your  appointment time.     Estimated time to finish at 1:00 PM   If the display on your pump reads "Low Volume" and it is beeping, take the batteries out of the pump and come to the cancer center for it to be taken off.   If the pump alarms go off prior to the pump reading "Low Volume" then call [phone] and someone can assist you.  If  the plunger comes out and the chemotherapy medication is leaking out, please use your home chemo spill kit to clean up the spill. Do NOT use paper towels or other household products.  If you have problems or questions regarding your pump, please call either [phone] (24 hours a day) or the cancer center Monday-Friday 8:00 a.m.- 4:30 p.m. at the clinic number and we will assist you. If you are unable to get assistance, then go to the nearest Emergency Department and ask the staff to contact the IV team for assistance.

## 2021-09-13 ENCOUNTER — Other Ambulatory Visit: Payer: Self-pay

## 2021-09-13 ENCOUNTER — Inpatient Hospital Stay: Payer: Medicare Other

## 2021-09-13 VITALS — BP 127/80 | HR 96 | Temp 98.2°F | Resp 18

## 2021-09-13 DIAGNOSIS — C786 Secondary malignant neoplasm of retroperitoneum and peritoneum: Secondary | ICD-10-CM

## 2021-09-13 DIAGNOSIS — D490 Neoplasm of unspecified behavior of digestive system: Secondary | ICD-10-CM

## 2021-09-13 DIAGNOSIS — Z5111 Encounter for antineoplastic chemotherapy: Secondary | ICD-10-CM | POA: Diagnosis not present

## 2021-09-13 MED ORDER — SODIUM CHLORIDE 0.9% FLUSH
10.0000 mL | INTRAVENOUS | Status: DC | PRN
  Administered 2021-09-13: 13:00:00 10 mL

## 2021-09-13 MED ORDER — HEPARIN SOD (PORK) LOCK FLUSH 100 UNIT/ML IV SOLN
500.0000 [IU] | Freq: Once | INTRAVENOUS | Status: AC | PRN
  Administered 2021-09-13: 13:00:00 500 [IU]

## 2021-09-13 MED ORDER — PEGFILGRASTIM-CBQV 6 MG/0.6ML ~~LOC~~ SOSY
6.0000 mg | PREFILLED_SYRINGE | Freq: Once | SUBCUTANEOUS | Status: AC
  Administered 2021-09-13: 13:00:00 6 mg via SUBCUTANEOUS
  Filled 2021-09-13: qty 0.6

## 2021-09-23 ENCOUNTER — Encounter: Payer: Self-pay | Admitting: Hematology

## 2021-09-25 MED FILL — Dexamethasone Sodium Phosphate Inj 100 MG/10ML: INTRAMUSCULAR | Qty: 1 | Status: AC

## 2021-09-26 ENCOUNTER — Inpatient Hospital Stay: Payer: Medicare Other | Attending: Hematology

## 2021-09-26 ENCOUNTER — Encounter: Payer: Self-pay | Admitting: Hematology

## 2021-09-26 ENCOUNTER — Inpatient Hospital Stay: Payer: Medicare Other

## 2021-09-26 ENCOUNTER — Other Ambulatory Visit: Payer: Self-pay

## 2021-09-26 ENCOUNTER — Inpatient Hospital Stay (HOSPITAL_BASED_OUTPATIENT_CLINIC_OR_DEPARTMENT_OTHER): Payer: Medicare Other | Admitting: Hematology

## 2021-09-26 VITALS — BP 113/70 | HR 82 | Temp 98.2°F | Resp 18

## 2021-09-26 DIAGNOSIS — C801 Malignant (primary) neoplasm, unspecified: Secondary | ICD-10-CM | POA: Insufficient documentation

## 2021-09-26 DIAGNOSIS — Z79899 Other long term (current) drug therapy: Secondary | ICD-10-CM | POA: Insufficient documentation

## 2021-09-26 DIAGNOSIS — G629 Polyneuropathy, unspecified: Secondary | ICD-10-CM | POA: Diagnosis not present

## 2021-09-26 DIAGNOSIS — D490 Neoplasm of unspecified behavior of digestive system: Secondary | ICD-10-CM

## 2021-09-26 DIAGNOSIS — C786 Secondary malignant neoplasm of retroperitoneum and peritoneum: Secondary | ICD-10-CM | POA: Insufficient documentation

## 2021-09-26 DIAGNOSIS — R971 Elevated cancer antigen 125 [CA 125]: Secondary | ICD-10-CM | POA: Insufficient documentation

## 2021-09-26 DIAGNOSIS — Z95828 Presence of other vascular implants and grafts: Secondary | ICD-10-CM

## 2021-09-26 DIAGNOSIS — E46 Unspecified protein-calorie malnutrition: Secondary | ICD-10-CM | POA: Insufficient documentation

## 2021-09-26 DIAGNOSIS — Z5111 Encounter for antineoplastic chemotherapy: Secondary | ICD-10-CM | POA: Diagnosis present

## 2021-09-26 DIAGNOSIS — R200 Anesthesia of skin: Secondary | ICD-10-CM | POA: Diagnosis not present

## 2021-09-26 DIAGNOSIS — D649 Anemia, unspecified: Secondary | ICD-10-CM | POA: Insufficient documentation

## 2021-09-26 DIAGNOSIS — K56609 Unspecified intestinal obstruction, unspecified as to partial versus complete obstruction: Secondary | ICD-10-CM | POA: Diagnosis not present

## 2021-09-26 DIAGNOSIS — Z5189 Encounter for other specified aftercare: Secondary | ICD-10-CM | POA: Insufficient documentation

## 2021-09-26 DIAGNOSIS — I700 Atherosclerosis of aorta: Secondary | ICD-10-CM | POA: Insufficient documentation

## 2021-09-26 DIAGNOSIS — C482 Malignant neoplasm of peritoneum, unspecified: Secondary | ICD-10-CM

## 2021-09-26 LAB — CBC WITH DIFFERENTIAL (CANCER CENTER ONLY)
Abs Immature Granulocytes: 0.42 10*3/uL — ABNORMAL HIGH (ref 0.00–0.07)
Basophils Absolute: 0.1 10*3/uL (ref 0.0–0.1)
Basophils Relative: 1 %
Eosinophils Absolute: 0.1 10*3/uL (ref 0.0–0.5)
Eosinophils Relative: 1 %
HCT: 29.9 % — ABNORMAL LOW (ref 36.0–46.0)
Hemoglobin: 10.1 g/dL — ABNORMAL LOW (ref 12.0–15.0)
Immature Granulocytes: 7 %
Lymphocytes Relative: 23 %
Lymphs Abs: 1.5 10*3/uL (ref 0.7–4.0)
MCH: 34.7 pg — ABNORMAL HIGH (ref 26.0–34.0)
MCHC: 33.8 g/dL (ref 30.0–36.0)
MCV: 102.7 fL — ABNORMAL HIGH (ref 80.0–100.0)
Monocytes Absolute: 0.9 10*3/uL (ref 0.1–1.0)
Monocytes Relative: 13 %
Neutro Abs: 3.6 10*3/uL (ref 1.7–7.7)
Neutrophils Relative %: 55 %
Platelet Count: 133 10*3/uL — ABNORMAL LOW (ref 150–400)
RBC: 2.91 MIL/uL — ABNORMAL LOW (ref 3.87–5.11)
RDW: 16.2 % — ABNORMAL HIGH (ref 11.5–15.5)
WBC Count: 6.5 10*3/uL (ref 4.0–10.5)
nRBC: 0 % (ref 0.0–0.2)

## 2021-09-26 LAB — CMP (CANCER CENTER ONLY)
ALT: 8 U/L (ref 0–44)
AST: 19 U/L (ref 15–41)
Albumin: 3.8 g/dL (ref 3.5–5.0)
Alkaline Phosphatase: 127 U/L — ABNORMAL HIGH (ref 38–126)
Anion gap: 9 (ref 5–15)
BUN: 15 mg/dL (ref 8–23)
CO2: 25 mmol/L (ref 22–32)
Calcium: 9.3 mg/dL (ref 8.9–10.3)
Chloride: 104 mmol/L (ref 98–111)
Creatinine: 0.95 mg/dL (ref 0.44–1.00)
GFR, Estimated: >60 mL/min (ref >60)
Glucose, Bld: 115 mg/dL — ABNORMAL HIGH (ref 70–99)
Potassium: 3.8 mmol/L (ref 3.5–5.1)
Sodium: 138 mmol/L (ref 135–145)
Total Bilirubin: 0.6 mg/dL (ref 0.3–1.2)
Total Protein: 8 g/dL (ref 6.5–8.1)

## 2021-09-26 MED ORDER — SODIUM CHLORIDE 0.9% FLUSH: 10.0000 mL | Freq: Once | INTRAVENOUS | Status: DC

## 2021-09-26 MED ORDER — DEXTROSE 5 % IV SOLN: Freq: Once | INTRAVENOUS | Status: AC

## 2021-09-26 MED ORDER — SODIUM CHLORIDE 0.9 % IV SOLN
2200.0000 mg/m2 | INTRAVENOUS | Status: DC
  Administered 2021-09-26: 4500 mg via INTRAVENOUS
  Filled 2021-09-26: qty 90

## 2021-09-26 MED ORDER — PALONOSETRON HCL INJECTION 0.25 MG/5ML
0.2500 mg | Freq: Once | INTRAVENOUS | Status: AC
  Administered 2021-09-26: 0.25 mg via INTRAVENOUS
  Filled 2021-09-26: qty 5

## 2021-09-26 MED ORDER — OXALIPLATIN CHEMO INJECTION 100 MG/20ML
50.0000 mg/m2 | Freq: Once | INTRAVENOUS | Status: AC
  Administered 2021-09-26: 100 mg via INTRAVENOUS
  Filled 2021-09-26: qty 20

## 2021-09-26 MED ORDER — LEUCOVORIN CALCIUM INJECTION 350 MG
400.0000 mg/m2 | Freq: Once | INTRAVENOUS | Status: AC
  Administered 2021-09-26: 816 mg via INTRAVENOUS
  Filled 2021-09-26: qty 17.5

## 2021-09-26 MED ORDER — SODIUM CHLORIDE 0.9 % IV SOLN
10.0000 mg | Freq: Once | INTRAVENOUS | Status: AC
  Administered 2021-09-26: 10 mg via INTRAVENOUS
  Filled 2021-09-26: qty 10

## 2021-09-26 NOTE — Patient Instructions (Signed)
[facility]  Discharge Instructions: Thank you for choosing [facility] to provide your oncology and hematology care.   If you have a lab appointment with the [facility], please go directly to the [facility] and check in at the registration area.   Wear comfortable clothing and clothing appropriate for easy access to any Portacath or PICC line.   We strive to give you quality time with your provider. You may need to reschedule your appointment if you arrive late (15 or more minutes).  Arriving late affects you and other patients whose appointments are after yours.  Also, if you miss three or more appointments without notifying the office, you may be dismissed from the clinic at the providers discretion.      For prescription refill requests, have your pharmacy contact our office and allow 72 hours for refills to be completed.    Today you received the following chemotherapy and/or immunotherapy agents: oxaliplatin/leucovorin/5FU      To help prevent nausea and vomiting after your treatment, we encourage you to take your nausea medication as directed.  BELOW ARE SYMPTOMS THAT SHOULD BE REPORTED IMMEDIATELY: *FEVER GREATER THAN 100.4 F (38 C) OR HIGHER *CHILLS OR SWEATING *NAUSEA AND VOMITING THAT IS NOT CONTROLLED WITH YOUR NAUSEA MEDICATION *UNUSUAL SHORTNESS OF BREATH *UNUSUAL BRUISING OR BLEEDING *URINARY PROBLEMS (pain or burning when urinating, or frequent urination) *BOWEL PROBLEMS (unusual diarrhea, constipation, pain near the anus) TENDERNESS IN MOUTH AND THROAT WITH OR WITHOUT PRESENCE OF ULCERS (sore throat, sores in mouth, or a toothache) UNUSUAL RASH, SWELLING OR PAIN  UNUSUAL VAGINAL DISCHARGE OR ITCHING   Items with * indicate a potential emergency and should be followed up as soon as possible or go to the Emergency Department if any problems should occur.  Please show the CHEMOTHERAPY ALERT CARD or IMMUNOTHERAPY ALERT  CARD at check-in to the Emergency Department and triage nurse.  Should you have questions after your visit or need to cancel or reschedule your appointment, please contact [facility]  Dept: [phone]  and follow the prompts.  Office hours are 8:00 a.m. to 4:30 p.m. Monday - Friday. Please note that voicemails left after 4:00 p.m. may not be returned until the following business day.  We are closed weekends and major holidays. You have access to a nurse at all times for urgent questions. Please call the main number to the clinic Dept: [phone] and follow the prompts.   For any non-urgent questions, you may also contact your provider using MyChart. We now offer e-Visits for anyone 77 and older to request care online for non-urgent symptoms. For details visit [email or web address].   Also download the MyChart app! Go to the app store, search "MyChart", open the app, select [facility], and log in with your MyChart username and password.  Due to Covid, a mask is required upon entering the hospital/clinic. If you do not have a mask, one will be given to you upon arrival. For doctor visits, patients may have 1 support person aged 41 or older with them. For treatment visits, patients cannot have anyone with them due to current Covid guidelines and our immunocompromised population.

## 2021-09-26 NOTE — Progress Notes (Signed)
Brenda Stewart   [phone] Fax:([phone]   Clinic Follow up Note   Patient Care Team: Pcp, No as PCP - General  Date of Service:  09/26/2021  CHIEF COMPLAINT: f/u of peritoneal carcinomatosis  CURRENT THERAPY:  First line FOLFOX q2 weeks, starting 05/04/21  ASSESSMENT & PLAN:  Brenda Stewart is a 71 y.o. female with   1. 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 CT AP 04/21/21 showed findings suspicious for diffuse peritoneal carcinomatosis. She was admitted and underwent paracentesis on 04/24/21 showing atypical cells. -Diagnostic laparoscopy on 04/30/21 with pathology from peritoneum biopsy showing metastatic carcinoma with signet ring cell features. IHC supports upper GI primary.  -Her EGD was negative for primary malignancy. -her cancer is PD-L1 negative and MMR normal -she began First line FOLFOX on 05/04/21, tolerating very well overall. Dose reduced from C6 due to thrombocytopenia. -CT CAP from 08/10/21 showed decreased peritoneal thickening/stranding, resolution of ascites, and reduced size of inferior right liver implant.  No new lesion or evidence of progression.  She has responded well to treatment.  -Continue FOLFOX every 2 weeks at same reduced dose due to neuropathy    2. Small bowel obstruction, secondary to peritoneal carcinomatosis, Protein calorie malnutrition -she was started on TPN in the hospital, will continue  -continues on soft diet, supplementing with Boost.  -she is now off TPN. She is eating well and weight is stable. -f/u with dietician    3. Mild Anemia -found to have anemia while in the hospital -stable, hgb 10.1 today (09/26/21)   4. Mild neuropathy, G1  -she had pre-existing neuropathy at feet before chemo, stable -she has some tingling in her fingers  recently. Will continue reduced dose.     Plan: -Proceed with cycle 11 FOLFOX, oxaliplatin dose reduced at 50 mg/m2 due to mild neuropathy -with G-CSF on day 3 -lab, f/u, and FOLFOX in 2 weeks as scheduled on 10/10/21              *she notes she will need transportation   No problem-specific Assessment & Plan notes found for this encounter.   SUMMARY OF ONCOLOGIC HISTORY: Oncology History  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[place])  04/21/2021 Imaging   CT AP w/o contrast  IMPRESSION: Findings highly suspicious for diffuse peritoneal carcinomatosis.   Suspected distal small bowel obstruction, with probable transition point in right lower quadrant.   Aortic Atherosclerosis (ICD10-I70.0).   04/22/2021 Imaging   US Abdomen  IMPRESSION: Small volume of lower abdominopelvic ascites.   04/24/2021 Pathology Results   Cytology  Specimen Submitted:  A. ASCITES, PARACENTESIS:   FINAL MICROSCOPIC DIAGNOSIS:  - Atypical cells present  DIAGNOSTIC COMMENTS:  Immunohistochemical stains show that the rare, mildly atypical cells are positive for calretinin and D2-40; while they are negative for PAX8, ER and MOC-31.  This immunoprofile is consistent with reactive mesothelial cells.    04/25/2021 Initial Diagnosis   Primary peritoneal carcinomatosis ([place])   04/29/2021 Imaging   CT AP  IMPRESSION: 1. Moderate ascites, mild peritoneal thickening and diffuse omental thickening/caking and smaller focal fluid/cystic areas within the RIGHT abdomen and LOWER pelvis, compatible with peritoneal carcinomatosis. Mild mesenteric stranding is nonspecific. 2. Small subserosal implants along the INFERIOR RIGHT liver. 3. Small LEFT pleural effusion and trace RIGHT pleural effusion with LEFT LOWER lung atelectasis. 4. Aortic  Atherosclerosis (ICD10-I70.0).   04/30/2021 Pathology Results   FINAL MICROSCOPIC DIAGNOSIS:   A. PERITONEUM, BIOPSY:  -  Metastatic carcinoma with signet ring cell features  -  See  comment   B. 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   ADDENDUM:  By immunohistochemistry, HER-2 is EQUIVOCAL (2+).    FLOURESCENCE IN-SITU HYBRIDIZATION RESULTS:  GROUP 5:   HER2 **NEGATIVE**    05/04/2021 -  Chemotherapy   Patient is on Treatment Plan : GASTRIC FOLFOX q14d x 12 cycles     05/04/2021 Imaging   CT Chest  IMPRESSION: 1. Suspected segmental filling defects in the anterior segment right upper lobe and apical segment right upper lobe compatible with small pulmonary emboli. Clot burden appears small, although please note that today's examination was not a dedicated CT angiogram and as such might overestimate or underestimate pulmonary embolus. Positive for acute PE with CT evidence of right heart strain (RV/LV Ratio = 1.1) consistent with at least submassive (intermediate risk) PE. The presence of right heart strain has been associated with an increased risk of morbidity and mortality. Please refer to the "PE Focused" order set in EPIC. 2. No compelling findings of metastatic disease to the chest. There is a small left pleural effusion, but no definite enhancement along the pleura to suggest malignant or exudative etiology at this time. 3. There is some new gas in the soft tissues of the left thoracoabdominal wall, compatible subcutaneous emphysema, probably related to recent laparoscopy or other procedure. 4. Hypodense thyroid nodules up to 2.1 cm in diameter. Recommend thyroid US (ref: J Am Coll Radiol. 2015 Feb;12(2): 143-50). 5. Left upper quadrant ascites, including fluid in the lesser  sac. This is reduced compared to the prior CT abdomen of 04/29/2021. 6. Stable appearance of periportal edema and hypodensities along the posterior margin of the right hepatic lobe.   05/07/2021 Pathology Results   FINAL MICROSCOPIC DIAGNOSIS:   A. DUODENUM, BULB, BIOPSY:  - Polypoid duodenal mucosa with chronic inflammation, foveolar  metaplasia and reactive changes  - Negative for dysplasia or malignancy   Cytology: Specimen Submitted:  A. ESOPHAGUS, BRUSHING:   FINAL MICROSCOPIC DIAGNOSIS:  - Benign reactive/reparative changes    05/16/2021 Cancer Staging   Staging form: Soft Tissue Sarcoma of the Abdomen and Thoracic Visceral Organs, AJCC 8th Edition - Clinical: cTX, cN0, pM1 - Signed by [name], MD on 05/16/2021    08/10/2021 Imaging   CT CAP IMPRESSION: 1. Subserosal implants along the inferior right liver are no longer apparent or decreased in size when compared to prior exam. 2. Interval resolution of abdominal ascites. Decreased peritoneal thickening and stranding. 3. Interval resolution of small left pleural effusion. 4.  Aortic Atherosclerosis (ICD10-I70.0).        INTERVAL HISTORY:  Brenda Stewart is here for a follow up of peritoneal carcinomatosis. She was last seen by me on 09/11/21. She was seen in the infusion area. She reports some numbness in her fingertips for the last week.   All other systems were reviewed with the patient and are negative.  MEDICAL HISTORY:  Past Medical History:  Diagnosis  Date   Acid reflux    Hypertension     SURGICAL HISTORY: Past Surgical History:  Procedure Laterality Date   BIOPSY  05/07/2021   Procedure: BIOPSY;  Surgeon: [name], MD;  Location: [facility];  Service: Endoscopy;;   BUNIONECTOMY Bilateral    ESOPHAGEAL BRUSHING  05/07/2021   Procedure: ESOPHAGEAL BRUSHING;  Surgeon: [name], MD;  Location: [facility];  Service: Endoscopy;;   ESOPHAGOGASTRODUODENOSCOPY (EGD) WITH PROPOFOL N/A 05/07/2021    Procedure: ESOPHAGOGASTRODUODENOSCOPY (EGD) WITH PROPOFOL;  Surgeon: [name], MD;  Location: [facility];  Service: Endoscopy;  Laterality: N/A;   HAND TENDON SURGERY Bilateral    IR IMAGING GUIDED PORT INSERTION  04/26/2021   IR PARACENTESIS  04/24/2021   LAPAROSCOPY N/A 04/30/2021   Procedure: LAPAROSCOPY DIAGNOSTIC WITH BIOPSIES;  Surgeon: Lafonda Mosses, MD;  Location: [facility];  Service: Gynecology;  Laterality: N/A;   PARTIAL HYSTERECTOMY      I have reviewed the social history and family history with the patient and they are unchanged from previous note.  ALLERGIES:  has No Known Allergies.  MEDICATIONS:  Current Outpatient Medications  Medication Sig Dispense Refill   amLODipine (NORVASC) 10 MG tablet Take 5 mg by mouth daily. Take 1 tab by mouth daily (Hold Dose for Systolic BP Less than 720; Avoid Grapefruit or its juice)     apixaban (ELIQUIS) 5 MG TABS tablet Take 1 tablet (5 mg total) by mouth 2 (two) times daily. 60 tablet 2   ascorbic acid (VITAMIN C) 250 MG CHEW Chew 500 mg by mouth daily.     Cholecalciferol (VITAMIN D3) 50 MCG (2000 UT) CHEW Chew 2 tablets by mouth daily.     lidocaine-prilocaine (EMLA) cream Apply 1 application topically as needed. 30 g 0   lisinopril (ZESTRIL) 40 MG tablet Take 40 mg by mouth daily. TAKE 1 TAB BY MOUTH DAILY (HOLD DOSE FOR SYSTOLIC BLOOD PRESSURE LESS THAN 100; AVOID POTASSIUM SUPPLEMENT/ARTIFICIAL SALT) (HOLD DOSE FOR SYSTOLIC BLOOD PRESSURE LESS THAN 100; AVOID POTASSIUM SUPPLEMENTS/ARTIFICIAL SALT)     omeprazole (PRILOSEC) 20 MG capsule Take 20 mg by mouth daily as needed (heartburn/acid reflux).     prochlorperazine (COMPAZINE) 10 MG tablet Take 1 tablet (10 mg total) by mouth every 6 (six) hours as needed for nausea or vomiting. 30 tablet 2   sennosides-docusate sodium (SENOKOT-S) 8.6-50 MG tablet Take 2 tablets by mouth daily as needed for constipation.     No current facility-administered medications for this visit.    Facility-Administered Medications Ordered in Other Visits  Medication Dose Route Frequency Provider Last Rate Last Admin   fluorouracil (ADRUCIL) 4,500 mg in sodium chloride 0.9 % 60 mL chemo infusion  2,200 mg/m2 (Treatment Plan Recorded) Intravenous 1 day or 1 dose [name], MD   4,500 mg at 09/26/21 1655    PHYSICAL EXAMINATION: ECOG PERFORMANCE STATUS: 1 - Symptomatic but completely ambulatory  There were no vitals filed for this visit. Wt Readings from Last 3 Encounters:  09/11/21 177 lb 3.2 oz (80.4 kg)  08/27/21 179 lb 3.2 oz (81.3 kg)  08/13/21 184 lb 2 oz (83.5 kg)     GENERAL:alert, no distress and comfortable SKIN: skin color normal, no rashes or significant lesions EYES: normal, Conjunctiva are pink and non-injected, sclera clear  NEURO: alert & oriented x 3 with fluent speech  LABORATORY DATA:  I have reviewed the data as listed CBC Latest Ref Rng & Units 09/26/2021 09/11/2021 08/27/2021  WBC 4.0 - 10.5 K/uL 6.5  5.5 8.5  Hemoglobin 12.0 - 15.0 g/dL 10.1(L) 10.3(L) 10.1(L)  Hematocrit 36.0 - 46.0 % 29.9(L) 30.2(L) 28.9(L)  Platelets 150 - 400 K/uL 133(L) 138(L) 161     CMP Latest Ref Rng & Units 09/26/2021 09/11/2021 08/27/2021  Glucose 70 - 99 mg/dL 115(H) 118(H) 104(H)  BUN 8 - 23 mg/dL '15 13 9  ' Creatinine 0.44 - 1.00 mg/dL 0.95 0.87 0.97  Sodium 135 - 145 mmol/L 138 139 139  Potassium 3.5 - 5.1 mmol/L 3.8 3.7 3.3(L)  Chloride 98 - 111 mmol/L 104 105 105  CO2 22 - 32 mmol/L 25 25 21(L)  Calcium 8.9 - 10.3 mg/dL 9.3 9.6 9.2  Total Protein 6.5 - 8.1 g/dL 8.0 7.8 8.2(H)  Total Bilirubin 0.3 - 1.2 mg/dL 0.6 0.4 0.4  Alkaline Phos 38 - 126 U/L 127(H) 114 115  AST 15 - 41 U/L '19 19 18  ' ALT 0 - 44 U/L '8 9 8      ' RADIOGRAPHIC STUDIES: I have personally reviewed the radiological images as listed and agreed with the findings in the report. No results found.    No orders of the defined types were placed in this encounter.  All questions were answered. The  patient knows to call the clinic with any problems, questions or concerns. No barriers to learning was detected. The total time spent in the appointment was 30 minutes.     [name], MD 09/26/2021   I, Wilburn Mylar, am acting as scribe for [name], MD.   I have reviewed the above documentation for accuracy and completeness, and I agree with the above.

## 2021-09-27 LAB — CA 125: Cancer Antigen (CA) 125: 7.7 U/mL (ref 0.0–38.1)

## 2021-09-28 ENCOUNTER — Inpatient Hospital Stay: Payer: Medicare Other

## 2021-09-28 VITALS — BP 125/69 | HR 96 | Temp 98.7°F | Resp 16

## 2021-09-28 DIAGNOSIS — Z5111 Encounter for antineoplastic chemotherapy: Secondary | ICD-10-CM | POA: Diagnosis not present

## 2021-09-28 DIAGNOSIS — Z95828 Presence of other vascular implants and grafts: Secondary | ICD-10-CM

## 2021-09-28 DIAGNOSIS — C786 Secondary malignant neoplasm of retroperitoneum and peritoneum: Secondary | ICD-10-CM

## 2021-09-28 DIAGNOSIS — D490 Neoplasm of unspecified behavior of digestive system: Secondary | ICD-10-CM

## 2021-09-28 MED ORDER — PEGFILGRASTIM-CBQV 6 MG/0.6ML ~~LOC~~ SOSY
6.0000 mg | PREFILLED_SYRINGE | Freq: Once | SUBCUTANEOUS | Status: AC
  Administered 2021-09-28: 6 mg via SUBCUTANEOUS
  Filled 2021-09-28: qty 0.6

## 2021-09-28 MED ORDER — SODIUM CHLORIDE 0.9% FLUSH
10.0000 mL | Freq: Once | INTRAVENOUS | Status: AC
  Administered 2021-09-28: 10 mL

## 2021-09-28 MED ORDER — HEPARIN SOD (PORK) LOCK FLUSH 100 UNIT/ML IV SOLN
500.0000 [IU] | Freq: Once | INTRAVENOUS | Status: AC
  Administered 2021-09-28: 500 [IU]

## 2021-09-28 NOTE — Addendum Note (Signed)
Addended by: Elizebeth Brooking on: 09/28/2021 03:53 PM   Modules accepted: Orders

## 2021-10-08 ENCOUNTER — Encounter: Payer: Self-pay | Admitting: Hematology

## 2021-10-09 MED FILL — Dexamethasone Sodium Phosphate Inj 100 MG/10ML: INTRAMUSCULAR | Qty: 1 | Status: AC

## 2021-10-10 ENCOUNTER — Inpatient Hospital Stay: Payer: Medicare Other

## 2021-10-10 ENCOUNTER — Other Ambulatory Visit: Payer: Self-pay

## 2021-10-10 ENCOUNTER — Inpatient Hospital Stay (HOSPITAL_BASED_OUTPATIENT_CLINIC_OR_DEPARTMENT_OTHER): Payer: Medicare Other | Admitting: Hematology

## 2021-10-10 ENCOUNTER — Other Ambulatory Visit (HOSPITAL_COMMUNITY): Payer: Self-pay

## 2021-10-10 ENCOUNTER — Encounter: Payer: Self-pay | Admitting: Hematology

## 2021-10-10 ENCOUNTER — Ambulatory Visit: Payer: TRICARE For Life (TFL)

## 2021-10-10 ENCOUNTER — Ambulatory Visit: Payer: TRICARE For Life (TFL) | Admitting: Hematology

## 2021-10-10 ENCOUNTER — Other Ambulatory Visit: Payer: TRICARE For Life (TFL)

## 2021-10-10 ENCOUNTER — Telehealth: Payer: Self-pay | Admitting: Pharmacist

## 2021-10-10 ENCOUNTER — Telehealth: Payer: Self-pay

## 2021-10-10 VITALS — BP 137/78 | HR 95 | Temp 98.5°F | Resp 16 | Ht 67.0 in | Wt 184.4 lb

## 2021-10-10 DIAGNOSIS — D490 Neoplasm of unspecified behavior of digestive system: Secondary | ICD-10-CM

## 2021-10-10 DIAGNOSIS — C786 Secondary malignant neoplasm of retroperitoneum and peritoneum: Secondary | ICD-10-CM

## 2021-10-10 DIAGNOSIS — Z5111 Encounter for antineoplastic chemotherapy: Secondary | ICD-10-CM | POA: Diagnosis not present

## 2021-10-10 DIAGNOSIS — Z95828 Presence of other vascular implants and grafts: Secondary | ICD-10-CM

## 2021-10-10 DIAGNOSIS — C482 Malignant neoplasm of peritoneum, unspecified: Secondary | ICD-10-CM

## 2021-10-10 LAB — CBC WITH DIFFERENTIAL (CANCER CENTER ONLY)
Abs Immature Granulocytes: 0.3 10*3/uL — ABNORMAL HIGH (ref 0.00–0.07)
Basophils Absolute: 0 10*3/uL (ref 0.0–0.1)
Basophils Relative: 1 %
Eosinophils Absolute: 0.1 10*3/uL (ref 0.0–0.5)
Eosinophils Relative: 1 %
HCT: 27.8 % — ABNORMAL LOW (ref 36.0–46.0)
Hemoglobin: 9.4 g/dL — ABNORMAL LOW (ref 12.0–15.0)
Immature Granulocytes: 5 %
Lymphocytes Relative: 28 %
Lymphs Abs: 1.7 10*3/uL (ref 0.7–4.0)
MCH: 35.7 pg — ABNORMAL HIGH (ref 26.0–34.0)
MCHC: 33.8 g/dL (ref 30.0–36.0)
MCV: 105.7 fL — ABNORMAL HIGH (ref 80.0–100.0)
Monocytes Absolute: 0.8 10*3/uL (ref 0.1–1.0)
Monocytes Relative: 14 %
Neutro Abs: 3.2 10*3/uL (ref 1.7–7.7)
Neutrophils Relative %: 51 %
Platelet Count: 143 10*3/uL — ABNORMAL LOW (ref 150–400)
RBC: 2.63 MIL/uL — ABNORMAL LOW (ref 3.87–5.11)
RDW: 15.5 % (ref 11.5–15.5)
WBC Count: 6.2 10*3/uL (ref 4.0–10.5)
nRBC: 0 % (ref 0.0–0.2)

## 2021-10-10 LAB — CMP (CANCER CENTER ONLY)
ALT: 9 U/L (ref 0–44)
AST: 19 U/L (ref 15–41)
Albumin: 3.8 g/dL (ref 3.5–5.0)
Alkaline Phosphatase: 130 U/L — ABNORMAL HIGH (ref 38–126)
Anion gap: 8 (ref 5–15)
BUN: 13 mg/dL (ref 8–23)
CO2: 27 mmol/L (ref 22–32)
Calcium: 9.2 mg/dL (ref 8.9–10.3)
Chloride: 106 mmol/L (ref 98–111)
Creatinine: 0.98 mg/dL (ref 0.44–1.00)
GFR, Estimated: >60 mL/min (ref >60)
Glucose, Bld: 96 mg/dL (ref 70–99)
Potassium: 3.7 mmol/L (ref 3.5–5.1)
Sodium: 141 mmol/L (ref 135–145)
Total Bilirubin: 0.4 mg/dL (ref 0.3–1.2)
Total Protein: 7.6 g/dL (ref 6.5–8.1)

## 2021-10-10 MED ORDER — SODIUM CHLORIDE 0.9 % IV SOLN
10.0000 mg | Freq: Once | INTRAVENOUS | Status: AC
  Administered 2021-10-10: 10 mg via INTRAVENOUS
  Filled 2021-10-10: qty 10

## 2021-10-10 MED ORDER — SODIUM CHLORIDE 0.9% FLUSH
10.0000 mL | Freq: Once | INTRAVENOUS | Status: AC
  Administered 2021-10-10: 10 mL

## 2021-10-10 MED ORDER — OXALIPLATIN CHEMO INJECTION 100 MG/20ML
50.0000 mg/m2 | Freq: Once | INTRAVENOUS | Status: AC
  Administered 2021-10-10: 100 mg via INTRAVENOUS
  Filled 2021-10-10: qty 20

## 2021-10-10 MED ORDER — CAPECITABINE 500 MG PO TABS
1000.0000 mg/m2 | ORAL_TABLET | Freq: Two times a day (BID) | ORAL | 0 refills | Status: DC
  Filled 2021-10-10: qty 112, 14d supply, fill #0

## 2021-10-10 MED ORDER — SODIUM CHLORIDE 0.9 % IV SOLN
2200.0000 mg/m2 | INTRAVENOUS | Status: DC
  Administered 2021-10-10: 4500 mg via INTRAVENOUS
  Filled 2021-10-10: qty 90

## 2021-10-10 MED ORDER — LEUCOVORIN CALCIUM INJECTION 350 MG
400.0000 mg/m2 | Freq: Once | INTRAVENOUS | Status: AC
  Administered 2021-10-10: 816 mg via INTRAVENOUS
  Filled 2021-10-10: qty 25

## 2021-10-10 MED ORDER — DEXTROSE 5 % IV SOLN: Freq: Once | INTRAVENOUS | Status: AC

## 2021-10-10 MED ORDER — PALONOSETRON HCL INJECTION 0.25 MG/5ML
0.2500 mg | Freq: Once | INTRAVENOUS | Status: AC
  Administered 2021-10-10: 0.25 mg via INTRAVENOUS
  Filled 2021-10-10: qty 5

## 2021-10-10 NOTE — Telephone Encounter (Signed)
Oral Oncology Pharmacist Encounter  Received new prescription for Xeloda (capecitabine) for the treatment of peritoneal cancer, planned duration until disease progression or unacceptable drug toxicity. Patient planned to receive 12th cycle of FOLFOX on 10/10/21 then will change to Xeloda in ~2 weeks.   CBC w/ Diff and CMP from 10/10/21 assessed, no baseline dose adjustments required for Xeloda at this time. Prescription dose and frequency assessed for appropriateness.   Current medication list in Epic reviewed, DDIs with Xeloda identified: Category C DDI between Xeloda and Omeprazole - proton-pump inhibitors can decrease efficacy of Xeloda - will discuss with patient alternatives to omeprazole, such as H2RA's like famotidine while on Xeloda.  Evaluated chart and no patient barriers to medication adherence noted.   Patient agreement for treatment documented in MD note on 10/10/21.  Prescription has been e-scribed to [facility] for benefits analysis and approval.  Oral Oncology Clinic will continue to follow for insurance authorization, copayment issues, initial counseling and start date.  Leron Croak, PharmD, BCPS Hematology/Oncology Clinical Pharmacist [facility] [phone] 10/10/2021 1:22 PM

## 2021-10-10 NOTE — Progress Notes (Signed)
[facility]   [phone] Fax:(336) [phone]   Clinic Follow up Note   Patient Care Team: Pcp, No as PCP - General  Date of Service:  10/10/2021  CHIEF COMPLAINT: f/u of peritoneal carcinomatosis  CURRENT THERAPY:  First line FOLFOX q2 weeks, starting 05/04/21  ASSESSMENT & PLAN:  Brenda Stewart is a 71 y.o. female with   1. 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 CT AP 04/21/21 showed findings suspicious for diffuse peritoneal carcinomatosis. She was admitted and underwent paracentesis on 04/24/21 showing atypical cells. -Diagnostic laparoscopy on 04/30/21 with pathology from peritoneum biopsy showing metastatic carcinoma with signet ring cell features. IHC supports upper GI primary.  -Her EGD was negative for primary malignancy. -her cancer is PD-L1 negative and MMR normal -she began First line FOLFOX on 05/04/21, tolerating very well overall. Dose reduced from C6 due to thrombocytopenia. -CT CAP from 08/10/21 showed decreased peritoneal thickening/stranding, resolution of ascites, and reduced size of inferior right liver implant.  No new lesion or evidence of progression.  She has responded well to treatment.  -Lab reviewed, adequate for treatment, will proceed FOLFOX today at same reduced dose. Due to neuropathy, I plan to discontinue oxaliplatin after this cycle, any change to Xeloda maintenance therapy. -Potential side effect of Xeloda were discussed with her in detail, she is interested.  I will call in today.   2. Mild Anemia -found to have anemia while in the hospital -stable, labs not processed at time of our visit   3. 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 She has significant decrease in sensation in her hands on tuning fork exam today. -we will plan to give low dose oxali today then stop.     Plan: -Proceed with cycle 12 FOLFOX, oxaliplatin dose reduced at 50 mg/m2 due to neuropathy.  Plan to stop FOLFOX after this cycle. -with G-CSF on day 3 -I prescribed Xeloda today so it will be mailed to her. Plan to start in 2-3 weeks -lab and f/u in 2-3 weeks, will start Xeloda at that time   No problem-specific Assessment & Plan notes found for this encounter.   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   Suspected distal small bowel obstruction, with probable transition point in right lower quadrant.   Aortic Atherosclerosis (ICD10-I70.0).   04/22/2021 Imaging   US Abdomen  IMPRESSION: Small volume of lower abdominopelvic ascites.   04/24/2021 Pathology Results   Cytology  Specimen Submitted:  A. ASCITES, PARACENTESIS:   FINAL MICROSCOPIC DIAGNOSIS:  - Atypical cells present  DIAGNOSTIC COMMENTS:  Immunohistochemical stains show that the rare, mildly atypical cells are positive for calretinin and D2-40; while they are negative for PAX8, ER and MOC-31.  This immunoprofile is consistent with reactive mesothelial cells.    04/25/2021 Initial Diagnosis   Primary peritoneal carcinomatosis ([place])   04/29/2021 Imaging   CT AP  IMPRESSION: 1. Moderate ascites, mild peritoneal thickening and diffuse omental thickening/caking and smaller focal fluid/cystic areas within the RIGHT abdomen and LOWER pelvis, compatible with peritoneal carcinomatosis. Mild mesenteric stranding is nonspecific. 2. Small subserosal implants  along the INFERIOR RIGHT liver. 3. Small LEFT pleural effusion and trace RIGHT pleural effusion with LEFT LOWER lung atelectasis. 4. Aortic  Atherosclerosis (ICD10-I70.0).   04/30/2021 Pathology Results   FINAL MICROSCOPIC DIAGNOSIS:   A. 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   ADDENDUM:  By immunohistochemistry, HER-2 is EQUIVOCAL (2+).    FLOURESCENCE IN-SITU HYBRIDIZATION RESULTS:  GROUP 5:   HER2 **NEGATIVE**    05/04/2021 -  Chemotherapy   Patient is on Treatment Plan : GASTRIC FOLFOX q14d x 12 cycles     05/04/2021 Imaging   CT Chest  IMPRESSION: 1. Suspected segmental filling defects in the anterior segment right upper lobe and apical segment right upper lobe compatible with small pulmonary emboli. Clot burden appears small, although please note that today's examination was not a dedicated CT angiogram and as such might overestimate or underestimate pulmonary embolus. Positive for acute PE with CT evidence of right heart strain (RV/LV Ratio = 1.1) consistent with at least submassive (intermediate risk) PE. The presence of right heart strain has been associated with an increased risk of morbidity and mortality. Please refer to the "PE Focused" order set in EPIC. 2. No compelling findings of metastatic disease to the chest. There is a small left pleural effusion, but no definite enhancement along the pleura to suggest malignant or exudative etiology at this time. 3. There is some new gas in the soft tissues of the left thoracoabdominal wall, compatible subcutaneous emphysema, probably related to recent laparoscopy or other procedure. 4.  Hypodense thyroid nodules up to 2.1 cm in diameter. Recommend thyroid US (ref: J Am Coll Radiol. 2015 Feb;12(2): 143-50). 5. Left upper quadrant ascites, including fluid in the lesser sac. This is reduced compared to the prior CT abdomen of 04/29/2021. 6. Stable appearance of periportal edema and hypodensities along the posterior margin of the right hepatic lobe.   05/07/2021 Pathology Results   FINAL MICROSCOPIC DIAGNOSIS:   A. DUODENUM, BULB, BIOPSY:  - Polypoid duodenal mucosa with chronic inflammation, foveolar  metaplasia and reactive changes  - Negative for dysplasia or malignancy   Cytology: Specimen Submitted:  A. ESOPHAGUS, BRUSHING:   FINAL MICROSCOPIC DIAGNOSIS:  - Benign reactive/reparative changes    05/16/2021 Cancer Staging   Staging form: Soft Tissue Sarcoma of the Abdomen and Thoracic Visceral Organs, AJCC 8th Edition - Clinical: cTX, cN0, pM1 - Signed by [name], MD on 05/16/2021    08/10/2021 Imaging   CT CAP IMPRESSION: 1. Subserosal implants along the inferior right liver are no longer apparent or decreased in size when compared to prior exam. 2. Interval resolution of abdominal ascites. Decreased peritoneal thickening and stranding. 3. Interval resolution of small left pleural effusion. 4.  Aortic Atherosclerosis (ICD10-I70.0).        INTERVAL HISTORY:  Brenda Stewart is here for a follow up of peritoneal carcinomatosis. She was last seen by me on 09/26/21. She presents to the clinic alone. She reports a slight numbness/tingling in her fingertips. She denies any issues  with fine motor function. She otherwise reports she is tolerating treatment well. She notes she is still drinking some alcohol.   All other systems were reviewed with the patient and are negative.  MEDICAL HISTORY:  Past Medical History:  Diagnosis Date   Acid reflux    Hypertension     SURGICAL HISTORY: Past Surgical History:  Procedure Laterality Date   BIOPSY  05/07/2021    Procedure: BIOPSY;  Surgeon: [name], MD;  Location: [facility];  Service: Endoscopy;;   BUNIONECTOMY Bilateral    ESOPHAGEAL BRUSHING  05/07/2021   Procedure: ESOPHAGEAL BRUSHING;  Surgeon: [name], MD;  Location: [facility];  Service: Endoscopy;;   ESOPHAGOGASTRODUODENOSCOPY (EGD) WITH PROPOFOL N/A 05/07/2021   Procedure: ESOPHAGOGASTRODUODENOSCOPY (EGD) WITH PROPOFOL;  Surgeon: [name], MD;  Location: [facility];  Service: Endoscopy;  Laterality: N/A;   HAND TENDON SURGERY Bilateral    IR IMAGING GUIDED PORT INSERTION  04/26/2021   IR PARACENTESIS  04/24/2021   LAPAROSCOPY N/A 04/30/2021   Procedure: LAPAROSCOPY DIAGNOSTIC WITH BIOPSIES;  Surgeon: Lafonda Mosses, MD;  Location: [facility];  Service: Gynecology;  Laterality: N/A;   PARTIAL HYSTERECTOMY      I have reviewed the social history and family history with the patient and they are unchanged from previous note.  ALLERGIES:  has No Known Allergies.  MEDICATIONS:  Current Outpatient Medications  Medication Sig Dispense Refill   capecitabine (XELODA) 500 MG tablet Take 4 tablets (2,000 mg total) by mouth 2 (two) times daily after a meal. 112 tablet 0   amLODipine (NORVASC) 10 MG tablet Take 5 mg by mouth daily. Take 1 tab by mouth daily (Hold Dose for Systolic BP Less than 315; Avoid Grapefruit or its juice)     apixaban (ELIQUIS) 5 MG TABS tablet Take 1 tablet (5 mg total) by mouth 2 (two) times daily. 60 tablet 2   ascorbic acid (VITAMIN C) 250 MG CHEW Chew 500 mg by mouth daily.     Cholecalciferol (VITAMIN D3) 50 MCG (2000 UT) CHEW Chew 2 tablets by mouth daily.     lidocaine-prilocaine (EMLA) cream Apply 1 application topically as needed. 30 g 0   lisinopril (ZESTRIL) 40 MG tablet Take 40 mg by mouth daily. TAKE 1 TAB BY MOUTH DAILY (HOLD DOSE FOR SYSTOLIC BLOOD PRESSURE LESS THAN 100; AVOID POTASSIUM SUPPLEMENT/ARTIFICIAL SALT) (HOLD DOSE FOR SYSTOLIC BLOOD PRESSURE LESS THAN 100; AVOID POTASSIUM  SUPPLEMENTS/ARTIFICIAL SALT)     omeprazole (PRILOSEC) 20 MG capsule Take 20 mg by mouth daily as needed (heartburn/acid reflux).     prochlorperazine (COMPAZINE) 10 MG tablet Take 1 tablet (10 mg total) by mouth every 6 (six) hours as needed for nausea or vomiting. 30 tablet 2   sennosides-docusate sodium (SENOKOT-S) 8.6-50 MG tablet Take 2 tablets by mouth daily as needed for constipation.     No current facility-administered medications for this visit.    PHYSICAL EXAMINATION: ECOG PERFORMANCE STATUS: 1 - Symptomatic but completely ambulatory  Vitals:   10/10/21 1144  BP: 137/78  Pulse: 95  Resp: 16  Temp: 98.5 F (36.9 C)  SpO2: 100%   Wt Readings from Last 3 Encounters:  10/10/21 184 lb 6.4 oz (83.6 kg)  09/11/21 177 lb 3.2 oz (80.4 kg)  08/27/21 179 lb 3.2 oz (81.3 kg)     GENERAL:alert, no distress and comfortable SKIN: skin color normal, no rashes or significant lesions EYES: normal, Conjunctiva are pink and non-injected, sclera clear  NEURO: alert & oriented x 3 with  fluent speech  LABORATORY DATA:  I have reviewed the data as listed CBC Latest Ref Rng & Units 10/10/2021 09/26/2021 09/11/2021  WBC 4.0 - 10.5 K/uL 6.2 6.5 5.5  Hemoglobin 12.0 - 15.0 g/dL 9.4(L) 10.1(L) 10.3(L)  Hematocrit 36.0 - 46.0 % 27.8(L) 29.9(L) 30.2(L)  Platelets 150 - 400 K/uL 143(L) 133(L) 138(L)     CMP Latest Ref Rng & Units 10/10/2021 09/26/2021 09/11/2021  Glucose 70 - 99 mg/dL 96 115(H) 118(H)  BUN 8 - 23 mg/dL '13 15 13  ' Creatinine 0.44 - 1.00 mg/dL 0.98 0.95 0.87  Sodium 135 - 145 mmol/L 141 138 139  Potassium 3.5 - 5.1 mmol/L 3.7 3.8 3.7  Chloride 98 - 111 mmol/L 106 104 105  CO2 22 - 32 mmol/L '27 25 25  ' Calcium 8.9 - 10.3 mg/dL 9.2 9.3 9.6  Total Protein 6.5 - 8.1 g/dL 7.6 8.0 7.8  Total Bilirubin 0.3 - 1.2 mg/dL 0.4 0.6 0.4  Alkaline Phos 38 - 126 U/L 130(H) 127(H) 114  AST 15 - 41 U/L '19 19 19  ' ALT 0 - 44 U/L '9 8 9      ' RADIOGRAPHIC STUDIES: I have personally reviewed  the radiological images as listed and agreed with the findings in the report. No results found.    No orders of the defined types were placed in this encounter.  All questions were answered. The patient knows to call the clinic with any problems, questions or concerns. No barriers to learning was detected. The total time spent in the appointment was 30 minutes.     [name], MD 10/10/2021   I, Wilburn Mylar, am acting as scribe for [name], MD.   I have reviewed the above documentation for accuracy and completeness, and I agree with the above.

## 2021-10-10 NOTE — Patient Instructions (Addendum)
[facility] ONCOLOGY   Discharge Instructions: Thank you for choosing [facility] to provide your oncology and hematology care.   If you have a lab appointment with the [facility], please go directly to the [facility] and check in at the registration area.   Wear comfortable clothing and clothing appropriate for easy access to any Portacath or PICC line.   We strive to give you quality time with your provider. You may need to reschedule your appointment if you arrive late (15 or more minutes).  Arriving late affects you and other patients whose appointments are after yours.  Also, if you miss three or more appointments without notifying the office, you may be dismissed from the clinic at the providers discretion.      For prescription refill requests, have your pharmacy contact our office and allow 72 hours for refills to be completed.    Today you received the following chemotherapy and/or immunotherapy agents: Oxaliplatin, Leucovorin, and Fluorouracil (Adrucil)       To help prevent nausea and vomiting after your treatment, we encourage you to take your nausea medication as directed.  BELOW ARE SYMPTOMS THAT SHOULD BE REPORTED IMMEDIATELY: *FEVER GREATER THAN 100.4 F (38 C) OR HIGHER *CHILLS OR SWEATING *NAUSEA AND VOMITING THAT IS NOT CONTROLLED WITH YOUR NAUSEA MEDICATION *UNUSUAL SHORTNESS OF BREATH *UNUSUAL BRUISING OR BLEEDING *URINARY PROBLEMS (pain or burning when urinating, or frequent urination) *BOWEL PROBLEMS (unusual diarrhea, constipation, pain near the anus) TENDERNESS IN MOUTH AND THROAT WITH OR WITHOUT PRESENCE OF ULCERS (sore throat, sores in mouth, or a toothache) UNUSUAL RASH, SWELLING OR PAIN  UNUSUAL VAGINAL DISCHARGE OR ITCHING   Items with * indicate a potential emergency and should be followed up as soon as possible or go to the Emergency Department if any problems should occur.  Please show the CHEMOTHERAPY ALERT  CARD or IMMUNOTHERAPY ALERT CARD at check-in to the Emergency Department and triage nurse.  Should you have questions after your visit or need to cancel or reschedule your appointment, please contact [facility]  Dept: [phone]  and follow the prompts.  Office hours are 8:00 a.m. to 4:30 p.m. Monday - Friday. Please note that voicemails left after 4:00 p.m. may not be returned until the following business day.  We are closed weekends and major holidays. You have access to a nurse at all times for urgent questions. Please call the main number to the clinic Dept: [phone] and follow the prompts.   For any non-urgent questions, you may also contact your provider using MyChart. We now offer e-Visits for anyone 13 and older to request care online for non-urgent symptoms. For details visit [email or web address].   Also download the MyChart app! Go to the app store, search "MyChart", open the app, select [facility], and log in with your MyChart username and password.  Due to Covid, a mask is required upon entering the hospital/clinic. If you do not have a mask, one will be given to you upon arrival. For doctor visits, patients may have 1 support person aged 85 or older with them. For treatment visits, patients cannot have anyone with them due to current Covid guidelines and our immunocompromised population.   The chemotherapy medication bag should finish at 46 hours, 96 hours, or 7 days. For example, if your pump is scheduled for 46 hours and it was put on at 4:00 p.m., it should finish at 2:00 p.m. the day it is scheduled  to come off regardless of your appointment time.     Estimated time to finish at Friday, 1/18 @ 2:15 PM.   If the display on your pump reads "Low Volume" and it is beeping, take the batteries out of the pump and come to the cancer center for it to be taken off.   If the pump alarms go off prior to the pump reading "Low Volume" then call  [phone] and someone can assist you.  If the plunger comes out and the chemotherapy medication is leaking out, please use your home chemo spill kit to clean up the spill. Do NOT use paper towels or other household products.  If you have problems or questions regarding your pump, please call either [phone] (24 hours a day) or the cancer center Monday-Friday 8:00 a.m.- 4:30 p.m. at the clinic number and we will assist you. If you are unable to get assistance, then go to the nearest Emergency Department and ask the staff to contact the IV team for assistance.

## 2021-10-10 NOTE — Telephone Encounter (Signed)
Oral Oncology Patient Advocate Encounter  After completing a benefits investigation, prior authorization for Xeloda is not required at this time through [facility].  Patient's copay is $14.      [place] [facility] Patient [place] Phone [phone] Fax [phone] 10/10/2021 12:31 PM'

## 2021-10-10 NOTE — Progress Notes (Signed)
Pump verified with Janifer Adie, RN.

## 2021-10-12 ENCOUNTER — Other Ambulatory Visit (HOSPITAL_COMMUNITY): Payer: Self-pay

## 2021-10-12 ENCOUNTER — Inpatient Hospital Stay: Payer: Medicare Other

## 2021-10-12 ENCOUNTER — Other Ambulatory Visit: Payer: Self-pay

## 2021-10-12 ENCOUNTER — Telehealth: Payer: Self-pay | Admitting: Hematology

## 2021-10-12 VITALS — BP 114/65 | HR 90 | Temp 98.3°F | Resp 18

## 2021-10-12 DIAGNOSIS — C786 Secondary malignant neoplasm of retroperitoneum and peritoneum: Secondary | ICD-10-CM

## 2021-10-12 DIAGNOSIS — Z95828 Presence of other vascular implants and grafts: Secondary | ICD-10-CM

## 2021-10-12 DIAGNOSIS — D490 Neoplasm of unspecified behavior of digestive system: Secondary | ICD-10-CM

## 2021-10-12 DIAGNOSIS — Z5111 Encounter for antineoplastic chemotherapy: Secondary | ICD-10-CM | POA: Diagnosis not present

## 2021-10-12 MED ORDER — CAPECITABINE 500 MG PO TABS
1000.0000 mg/m2 | ORAL_TABLET | Freq: Two times a day (BID) | ORAL | 0 refills | Status: DC
  Filled 2021-10-12: qty 112, 14d supply, fill #0
  Filled 2021-10-24: qty 112, 21d supply, fill #0

## 2021-10-12 MED ORDER — HEPARIN SOD (PORK) LOCK FLUSH 100 UNIT/ML IV SOLN: 500.0000 [IU] | Freq: Once | INTRAVENOUS | Status: DC

## 2021-10-12 MED ORDER — PEGFILGRASTIM-CBQV 6 MG/0.6ML ~~LOC~~ SOSY
6.0000 mg | PREFILLED_SYRINGE | Freq: Once | SUBCUTANEOUS | Status: AC
  Administered 2021-10-12: 6 mg via SUBCUTANEOUS

## 2021-10-12 MED ORDER — SODIUM CHLORIDE 0.9% FLUSH: 10.0000 mL | Freq: Once | INTRAVENOUS | Status: DC

## 2021-10-12 NOTE — Telephone Encounter (Signed)
Scheduled follow-up appointment per 1/18 los. Patient is aware.

## 2021-10-18 NOTE — Telephone Encounter (Signed)
Oral Chemotherapy Pharmacist Encounter   Attempted to reach patient to provide update and offer for initial counseling on oral medication: Xeloda (capecitabine).   No answer. Voicemail box full and unable to leave message for callback.  Leron Croak, PharmD, BCPS Hematology/Oncology Clinical Pharmacist [facility] [place] [phone] 10/18/2021 12:33 PM

## 2021-10-19 NOTE — Telephone Encounter (Addendum)
Oral Chemotherapy Pharmacist Encounter   2nd attempt made to reach patient to provide update and offer for initial counseling on oral medication: Xeloda (capecitabine).   No answer. Unable to leave voicemail for call back due to voicemail box being full.   Leron Croak, PharmD, BCPS Hematology/Oncology Clinical Pharmacist [facility] [place] [phone] 10/19/2021 2:40 PM

## 2021-10-22 NOTE — Telephone Encounter (Signed)
Oral Chemotherapy Pharmacist Encounter   3rd attempt made to reach patient to provide update and offer for initial counseling on oral medication: Xeloda (capecitabine).   No answer. Unable to leave voicemail due to voicemail box still being full. Will follow up with patient in clinic on 10/24/21 to discuss details of medication acquisition and initial counseling session.  Leron Croak, PharmD, BCPS Hematology/Oncology Clinical Pharmacist [facility] [place] [phone] 10/22/2021 10:26 AM

## 2021-10-24 ENCOUNTER — Other Ambulatory Visit (HOSPITAL_COMMUNITY): Payer: Self-pay

## 2021-10-24 ENCOUNTER — Other Ambulatory Visit: Payer: Self-pay

## 2021-10-24 ENCOUNTER — Inpatient Hospital Stay: Payer: Medicare Other | Attending: Hematology | Admitting: Hematology

## 2021-10-24 ENCOUNTER — Encounter: Payer: Self-pay | Admitting: Hematology

## 2021-10-24 ENCOUNTER — Inpatient Hospital Stay: Payer: Medicare Other

## 2021-10-24 VITALS — BP 131/68 | HR 87 | Temp 98.5°F | Resp 18 | Ht 67.0 in | Wt 183.2 lb

## 2021-10-24 DIAGNOSIS — Z95828 Presence of other vascular implants and grafts: Secondary | ICD-10-CM

## 2021-10-24 DIAGNOSIS — Z79899 Other long term (current) drug therapy: Secondary | ICD-10-CM | POA: Diagnosis not present

## 2021-10-24 DIAGNOSIS — C801 Malignant (primary) neoplasm, unspecified: Secondary | ICD-10-CM | POA: Insufficient documentation

## 2021-10-24 DIAGNOSIS — R971 Elevated cancer antigen 125 [CA 125]: Secondary | ICD-10-CM | POA: Diagnosis not present

## 2021-10-24 DIAGNOSIS — D490 Neoplasm of unspecified behavior of digestive system: Secondary | ICD-10-CM

## 2021-10-24 DIAGNOSIS — Z86711 Personal history of pulmonary embolism: Secondary | ICD-10-CM | POA: Insufficient documentation

## 2021-10-24 DIAGNOSIS — Z9221 Personal history of antineoplastic chemotherapy: Secondary | ICD-10-CM | POA: Diagnosis not present

## 2021-10-24 DIAGNOSIS — C482 Malignant neoplasm of peritoneum, unspecified: Secondary | ICD-10-CM

## 2021-10-24 DIAGNOSIS — R188 Other ascites: Secondary | ICD-10-CM | POA: Insufficient documentation

## 2021-10-24 DIAGNOSIS — I700 Atherosclerosis of aorta: Secondary | ICD-10-CM | POA: Insufficient documentation

## 2021-10-24 DIAGNOSIS — Z7901 Long term (current) use of anticoagulants: Secondary | ICD-10-CM | POA: Insufficient documentation

## 2021-10-24 DIAGNOSIS — D649 Anemia, unspecified: Secondary | ICD-10-CM | POA: Insufficient documentation

## 2021-10-24 DIAGNOSIS — E042 Nontoxic multinodular goiter: Secondary | ICD-10-CM | POA: Diagnosis not present

## 2021-10-24 DIAGNOSIS — G629 Polyneuropathy, unspecified: Secondary | ICD-10-CM | POA: Insufficient documentation

## 2021-10-24 DIAGNOSIS — C786 Secondary malignant neoplasm of retroperitoneum and peritoneum: Secondary | ICD-10-CM | POA: Diagnosis present

## 2021-10-24 DIAGNOSIS — J90 Pleural effusion, not elsewhere classified: Secondary | ICD-10-CM | POA: Diagnosis not present

## 2021-10-24 LAB — CBC WITH DIFFERENTIAL (CANCER CENTER ONLY)
Abs Immature Granulocytes: 0.41 10*3/uL — ABNORMAL HIGH (ref 0.00–0.07)
Basophils Absolute: 0.1 10*3/uL (ref 0.0–0.1)
Basophils Relative: 1 %
Eosinophils Absolute: 0.1 10*3/uL (ref 0.0–0.5)
Eosinophils Relative: 1 %
HCT: 29.2 % — ABNORMAL LOW (ref 36.0–46.0)
Hemoglobin: 9.7 g/dL — ABNORMAL LOW (ref 12.0–15.0)
Immature Granulocytes: 6 %
Lymphocytes Relative: 25 %
Lymphs Abs: 1.7 10*3/uL (ref 0.7–4.0)
MCH: 35.3 pg — ABNORMAL HIGH (ref 26.0–34.0)
MCHC: 33.2 g/dL (ref 30.0–36.0)
MCV: 106.2 fL — ABNORMAL HIGH (ref 80.0–100.0)
Monocytes Absolute: 0.9 10*3/uL (ref 0.1–1.0)
Monocytes Relative: 12 %
Neutro Abs: 3.9 10*3/uL (ref 1.7–7.7)
Neutrophils Relative %: 55 %
Platelet Count: 142 10*3/uL — ABNORMAL LOW (ref 150–400)
RBC: 2.75 MIL/uL — ABNORMAL LOW (ref 3.87–5.11)
RDW: 14.7 % (ref 11.5–15.5)
WBC Count: 7 10*3/uL (ref 4.0–10.5)
nRBC: 0 % (ref 0.0–0.2)

## 2021-10-24 LAB — CMP (CANCER CENTER ONLY)
ALT: 8 U/L (ref 0–44)
AST: 18 U/L (ref 15–41)
Albumin: 4 g/dL (ref 3.5–5.0)
Alkaline Phosphatase: 138 U/L — ABNORMAL HIGH (ref 38–126)
Anion gap: 8 (ref 5–15)
BUN: 19 mg/dL (ref 8–23)
CO2: 25 mmol/L (ref 22–32)
Calcium: 9.6 mg/dL (ref 8.9–10.3)
Chloride: 105 mmol/L (ref 98–111)
Creatinine: 1.06 mg/dL — ABNORMAL HIGH (ref 0.44–1.00)
GFR, Estimated: 57 mL/min — ABNORMAL LOW (ref >60)
Glucose, Bld: 127 mg/dL — ABNORMAL HIGH (ref 70–99)
Potassium: 3.8 mmol/L (ref 3.5–5.1)
Sodium: 138 mmol/L (ref 135–145)
Total Bilirubin: 0.6 mg/dL (ref 0.3–1.2)
Total Protein: 8.1 g/dL (ref 6.5–8.1)

## 2021-10-24 MED ORDER — APIXABAN 5 MG PO TABS: 5.0000 mg | ORAL_TABLET | Freq: Two times a day (BID) | ORAL | 2 refills | Status: DC

## 2021-10-24 MED ORDER — SODIUM CHLORIDE 0.9% FLUSH
10.0000 mL | Freq: Once | INTRAVENOUS | Status: AC
  Administered 2021-10-24: 10 mL

## 2021-10-24 MED ORDER — HEPARIN SOD (PORK) LOCK FLUSH 100 UNIT/ML IV SOLN
500.0000 [IU] | Freq: Once | INTRAVENOUS | Status: AC
  Administered 2021-10-24: 500 [IU]

## 2021-10-24 NOTE — Progress Notes (Signed)
[place]   [phone] [phone]   Clinic Follow up Note   Patient Care Team: Pcp, No as PCP - General [name], MD as Consulting Physician (Hematology)  Date of Service:  10/24/2021  CHIEF COMPLAINT: f/u of peritoneal carcinomatosis  CURRENT THERAPY:  PENDING Xeloda  ASSESSMENT & PLAN:  Brenda Stewart is a 71 y.o. female with   1. 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 CT AP 04/21/21 showed findings suspicious for diffuse peritoneal carcinomatosis. She was admitted and underwent paracentesis on 04/24/21 showing atypical cells. -Diagnostic laparoscopy on 04/30/21 with pathology from peritoneum biopsy showing metastatic carcinoma with signet ring cell features. IHC supports upper GI primary.  -Her EGD was negative for primary malignancy. -her cancer is PD-L1 negative and MMR normal -she began First line FOLFOX on 05/04/21, tolerating very well overall. Dose reduced from C6 due to thrombocytopenia. -CT CAP from 08/10/21 showed decreased peritoneal thickening/stranding, resolution of ascites, and reduced size of inferior right liver implant.  No new lesion or evidence of progression.  She has responded well to treatment.  -she completed 12 cycles of FOLFOX on 10/10/21, stopped due to neuropathy. We plan to start her on maintenance Xeloda in the next week.  I reviewed the benefit and potential side effects with her.  She will stop omeprazole due to drug drug interaction.  Pharmacist Wells Guiles will come talk to her today.   2. Mild Anemia -found to have anemia while in the hospital -stable, hgb 9.7 today (10/24/21)   3. 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 She  has significant decrease in sensation in her hands on tuning fork exam today. -oxali discontinued 10/10/21     Plan: -start Xeloda next Monday 2/6, for day 1-14 every 21 days  -I refilled her Eliquis, stop omeprazole.   No problem-specific Assessment & Plan notes found for this encounter.   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   Suspected distal small bowel obstruction, with probable transition point in right lower quadrant.   Aortic Atherosclerosis (ICD10-I70.0).   04/22/2021 Imaging   US Abdomen  IMPRESSION: Small volume of lower abdominopelvic ascites.   04/24/2021 Pathology Results   Cytology  Specimen Submitted:  A. ASCITES, PARACENTESIS:   FINAL MICROSCOPIC DIAGNOSIS:  - Atypical cells present  DIAGNOSTIC COMMENTS:  Immunohistochemical stains show that the rare, mildly atypical cells are positive for calretinin and D2-40; while they are negative for PAX8, ER and MOC-31.  This immunoprofile is consistent with reactive mesothelial cells.    04/25/2021 Initial Diagnosis   Primary peritoneal carcinomatosis ([place])   04/29/2021 Imaging   CT AP  IMPRESSION: 1. Moderate ascites, mild peritoneal thickening and diffuse omental thickening/caking and smaller focal fluid/cystic areas within the RIGHT abdomen and LOWER pelvis, compatible with peritoneal carcinomatosis. Mild mesenteric stranding is nonspecific. 2. Small subserosal implants along the INFERIOR RIGHT liver. 3. Small LEFT pleural effusion and trace RIGHT pleural effusion with LEFT LOWER lung atelectasis. 4. Aortic Atherosclerosis (ICD10-I70.0).   04/30/2021 Pathology Results   FINAL MICROSCOPIC DIAGNOSIS:   A. PERITONEUM, BIOPSY:  -  Metastatic carcinoma  with signet ring cell features  -  See comment   B. PERTIONEUM, ANTERIOR, EXCISION:  -   Metastatic carcinoma with signet ring cell features  -  See comment   COMMENT:   By immunohistochemistry (performed on blocks A2 and B1), the neoplastic cells are positive for cytokeratin AE1/3, cytokeratin 20 and CDX2 but negative for TTF-1, ER, GATA3, PAX 8 and cytokeratin 7.  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   ADDENDUM:  By immunohistochemistry, HER-2 is EQUIVOCAL (2+).    FLOURESCENCE IN-SITU HYBRIDIZATION RESULTS:  GROUP 5:   HER2 **NEGATIVE**    05/04/2021 -  Chemotherapy   Patient is on Treatment Plan : GASTRIC FOLFOX q14d x 12 cycles     05/04/2021 Imaging   CT Chest  IMPRESSION: 1. Suspected segmental filling defects in the anterior segment right upper lobe and apical segment right upper lobe compatible with small pulmonary emboli. Clot burden appears small, although please note that today's examination was not a dedicated CT angiogram and as such might overestimate or underestimate pulmonary embolus. Positive for acute PE with CT evidence of right heart strain (RV/LV Ratio = 1.1) consistent with at least submassive (intermediate risk) PE. The presence of right heart strain has been associated with an increased risk of morbidity and mortality. Please refer to the "PE Focused" order set in EPIC. 2. No compelling findings of metastatic disease to the chest. There is a small left pleural effusion, but no definite enhancement along the pleura to suggest malignant or exudative etiology at this time. 3. There is some new gas in the soft tissues of the left thoracoabdominal wall, compatible subcutaneous emphysema, probably related to recent laparoscopy or other procedure. 4. Hypodense thyroid nodules up to 2.1 cm in diameter. Recommend thyroid US (ref: J Am Coll Radiol. 2015 Feb;12(2): 143-50). 5. Left upper quadrant ascites, including fluid in the lesser sac. This is reduced compared to the prior CT abdomen  of 04/29/2021. 6. Stable appearance of periportal edema and hypodensities along the posterior margin of the right hepatic lobe.   05/07/2021 Pathology Results   FINAL MICROSCOPIC DIAGNOSIS:   A. DUODENUM, BULB, BIOPSY:  - Polypoid duodenal mucosa with chronic inflammation, foveolar  metaplasia and reactive changes  - Negative for dysplasia or malignancy   Cytology: Specimen Submitted:  A. ESOPHAGUS, BRUSHING:   FINAL MICROSCOPIC DIAGNOSIS:  - Benign reactive/reparative changes    05/16/2021 Cancer Staging   Staging form: Soft Tissue Sarcoma of the Abdomen and Thoracic Visceral Organs, AJCC 8th Edition - Clinical: cTX, cN0, pM1 - Signed by [name], MD on 05/16/2021    08/10/2021 Imaging   CT CAP IMPRESSION: 1. Subserosal implants along the inferior right liver are no longer apparent or decreased in size when compared to prior exam. 2. Interval resolution of abdominal ascites. Decreased peritoneal thickening and stranding. 3. Interval resolution of small left pleural effusion. 4.  Aortic Atherosclerosis (ICD10-I70.0).        INTERVAL HISTORY:  Brenda Stewart is here for a follow up of peritoneal carcinomatosis. She was last seen by me on 10/10/21. She presents to the clinic alone.   All other systems were reviewed with the patient and are negative.  MEDICAL HISTORY:  Past Medical History:  Diagnosis Date   Acid reflux    Hypertension     SURGICAL HISTORY: Past Surgical History:  Procedure Laterality Date   BIOPSY  05/07/2021   Procedure: BIOPSY;  Surgeon: Collene Mares,  Mar Daring, MD;  Location: [facility];  Service: Endoscopy;;   BUNIONECTOMY Bilateral    ESOPHAGEAL BRUSHING  05/07/2021   Procedure: ESOPHAGEAL BRUSHING;  Surgeon: [name], MD;  Location: [facility];  Service: Endoscopy;;   ESOPHAGOGASTRODUODENOSCOPY (EGD) WITH PROPOFOL N/A 05/07/2021   Procedure: ESOPHAGOGASTRODUODENOSCOPY (EGD) WITH PROPOFOL;  Surgeon: [name], MD;  Location: [facility];   Service: Endoscopy;  Laterality: N/A;   HAND TENDON SURGERY Bilateral    IR IMAGING GUIDED PORT INSERTION  04/26/2021   IR PARACENTESIS  04/24/2021   LAPAROSCOPY N/A 04/30/2021   Procedure: LAPAROSCOPY DIAGNOSTIC WITH BIOPSIES;  Surgeon: Lafonda Mosses, MD;  Location: [facility];  Service: Gynecology;  Laterality: N/A;   PARTIAL HYSTERECTOMY      I have reviewed the social history and family history with the patient and they are unchanged from previous note.  ALLERGIES:  has No Known Allergies.  MEDICATIONS:  Current Outpatient Medications  Medication Sig Dispense Refill   amLODipine (NORVASC) 10 MG tablet Take 5 mg by mouth daily. Take 1 tab by mouth daily (Hold Dose for Systolic BP Less than 244; Avoid Grapefruit or its juice)     apixaban (ELIQUIS) 5 MG TABS tablet Take 1 tablet (5 mg total) by mouth 2 (two) times daily. 60 tablet 2   ascorbic acid (VITAMIN C) 250 MG CHEW Chew 500 mg by mouth daily.     capecitabine (XELODA) 500 MG tablet Take 4 tablets (2,000 mg total) by mouth 2 (two) times daily after a meal. Take for 14 days on, 7 days off. Repeat every 21 days. 112 tablet 0   Cholecalciferol (VITAMIN D3) 50 MCG (2000 UT) CHEW Chew 2 tablets by mouth daily.     lidocaine-prilocaine (EMLA) cream Apply 1 application topically as needed. 30 g 0   lisinopril (ZESTRIL) 40 MG tablet Take 40 mg by mouth daily. TAKE 1 TAB BY MOUTH DAILY (HOLD DOSE FOR SYSTOLIC BLOOD PRESSURE LESS THAN 100; AVOID POTASSIUM SUPPLEMENT/ARTIFICIAL SALT) (HOLD DOSE FOR SYSTOLIC BLOOD PRESSURE LESS THAN 100; AVOID POTASSIUM SUPPLEMENTS/ARTIFICIAL SALT)     prochlorperazine (COMPAZINE) 10 MG tablet Take 1 tablet (10 mg total) by mouth every 6 (six) hours as needed for nausea or vomiting. 30 tablet 2   sennosides-docusate sodium (SENOKOT-S) 8.6-50 MG tablet Take 2 tablets by mouth daily as needed for constipation.     No current facility-administered medications for this visit.    PHYSICAL EXAMINATION: ECOG  PERFORMANCE STATUS: 1 - Symptomatic but completely ambulatory  Vitals:   10/24/21 1316  BP: 131/68  Pulse: 87  Resp: 18  Temp: 98.5 F (36.9 C)  SpO2: 99%   Wt Readings from Last 3 Encounters:  10/24/21 183 lb 3.2 oz (83.1 kg)  10/10/21 184 lb 6.4 oz (83.6 kg)  09/11/21 177 lb 3.2 oz (80.4 kg)     GENERAL:alert, no distress and comfortable SKIN: skin color normal, no rashes or significant lesions EYES: normal, Conjunctiva are pink and non-injected, sclera clear  NEURO: alert & oriented x 3 with fluent speech  LABORATORY DATA:  I have reviewed the data as listed CBC Latest Ref Rng & Units 10/24/2021 10/10/2021 09/26/2021  WBC 4.0 - 10.5 K/uL 7.0 6.2 6.5  Hemoglobin 12.0 - 15.0 g/dL 9.7(L) 9.4(L) 10.1(L)  Hematocrit 36.0 - 46.0 % 29.2(L) 27.8(L) 29.9(L)  Platelets 150 - 400 K/uL 142(L) 143(L) 133(L)     CMP Latest Ref Rng & Units 10/24/2021 10/10/2021 09/26/2021  Glucose 70 - 99 mg/dL 127(H) 96 115(H)  BUN 8 -  23 mg/dL '19 13 15  ' Creatinine 0.44 - 1.00 mg/dL 1.06(H) 0.98 0.95  Sodium 135 - 145 mmol/L 138 141 138  Potassium 3.5 - 5.1 mmol/L 3.8 3.7 3.8  Chloride 98 - 111 mmol/L 105 106 104  CO2 22 - 32 mmol/L '25 27 25  ' Calcium 8.9 - 10.3 mg/dL 9.6 9.2 9.3  Total Protein 6.5 - 8.1 g/dL 8.1 7.6 8.0  Total Bilirubin 0.3 - 1.2 mg/dL 0.6 0.4 0.6  Alkaline Phos 38 - 126 U/L 138(H) 130(H) 127(H)  AST 15 - 41 U/L '18 19 19  ' ALT 0 - 44 U/L '8 9 8      ' RADIOGRAPHIC STUDIES: I have personally reviewed the radiological images as listed and agreed with the findings in the report. No results found.    No orders of the defined types were placed in this encounter.  All questions were answered. The patient knows to call the clinic with any problems, questions or concerns. No barriers to learning was detected. The total time spent in the appointment was 30 minutes.     [name], MD 10/24/2021   I, Wilburn Mylar, am acting as scribe for [name], MD.   I have reviewed the above  documentation for accuracy and completeness, and I agree with the above.

## 2021-10-24 NOTE — Telephone Encounter (Signed)
Oral Chemotherapy Pharmacist Encounter  I met with patient in clinic today for overview of: Xeloda (capecitabine) for the treatment of peritoneal cancer, planned duration until disease progression or unacceptable drug toxicity.  Counseled patient on administration, dosing, side effects, monitoring, drug-food interactions, safe handling, storage, and disposal.  Patient will take Xeloda 565m tablets, 4 tablets (20062m by mouth in AM and 4 tabs (200030mby mouth in PM, within 30 minutes of finishing meals, for 14 days on, 7 days off, repeated every 21 days.  Xeloda start date: 10/29/21  Adverse effects include but are not limited to: fatigue, decreased blood counts, GI upset, diarrhea, mouth sores, and hand-foot syndrome.  Patient will obtain anti diarrheal and alert the office of 4 or more loose stools above baseline.  Reviewed with patient importance of keeping a medication schedule and plan for any missed doses. No barriers to medication adherence identified.  Medication reconciliation performed and medication/allergy list updated. Patient states she no longer uses omeprazole Medication list updated to reflect this.  Patient will pick this up from the [facility] 10/24/21.  Patient informed the pharmacy will reach out 5-7 days prior to needing next fill of Xeloda to coordinate continued medication acquisition to prevent break in therapy.  All questions answered.  Ms. WomHenslericed understanding and appreciation.   Medication education handout given to patient. I will mail patient a medication calendar for her first cycle of Xeloda. Patient knows to call the office with questions or concerns. Oral Chemotherapy Clinic phone number provided to patient.   Brenda Stewart, Brenda Stewart Hematology/Oncology Clinical Pharmacist [phone]/2023 1:33 PM

## 2021-10-25 LAB — CA 125: Cancer Antigen (CA) 125: 7.9 U/mL (ref 0.0–38.1)

## 2021-11-05 ENCOUNTER — Other Ambulatory Visit (HOSPITAL_COMMUNITY): Payer: Self-pay

## 2021-11-06 ENCOUNTER — Other Ambulatory Visit (HOSPITAL_COMMUNITY): Payer: Self-pay

## 2021-11-06 ENCOUNTER — Inpatient Hospital Stay (HOSPITAL_BASED_OUTPATIENT_CLINIC_OR_DEPARTMENT_OTHER): Payer: Medicare Other | Admitting: Hematology

## 2021-11-06 ENCOUNTER — Encounter: Payer: Self-pay | Admitting: Hematology

## 2021-11-06 DIAGNOSIS — C786 Secondary malignant neoplasm of retroperitoneum and peritoneum: Secondary | ICD-10-CM | POA: Diagnosis not present

## 2021-11-06 MED ORDER — CAPECITABINE 500 MG PO TABS
1000.0000 mg/m2 | ORAL_TABLET | Freq: Two times a day (BID) | ORAL | 1 refills | Status: DC
  Filled 2021-11-06: qty 112, 14d supply, fill #0
  Filled 2021-11-09: qty 112, 21d supply, fill #0
  Filled 2021-11-27: qty 112, 21d supply, fill #1

## 2021-11-06 NOTE — Progress Notes (Signed)
[place]   [phone] Fax:([phone]) [phone]   Clinic Follow up Note   Patient Care Team: Pcp, No as PCP - General [name], MD as Consulting Physician (Hematology)  Date of Service:  11/06/2021  I connected with Brenda Stewart on 11/06/21 at 10:00 AM EST by telephone and verified that I am speaking with the correct person using two identifiers.   I discussed the limitations, risks, security and privacy concerns of performing an evaluation and management service by telephone and the availability of in person appointments. I also discussed with the patient that there may be a patient responsible charge related to this service. The patient expressed understanding and agreed to proceed.   Patient's location:  Home  Provider's location:  Office    CHIEF COMPLAINT: f/u of peritoneal carcinomatosis  CURRENT THERAPY:  PENDING Xeloda  ASSESSMENT & PLAN:  Brenda Stewart is a 71 y.o. female with   1. 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 CT AP 04/21/21 showed findings suspicious for diffuse peritoneal carcinomatosis. She was admitted and underwent paracentesis on 04/24/21 showing atypical cells. -Diagnostic laparoscopy on 04/30/21 with pathology from peritoneum biopsy showing metastatic carcinoma with signet ring cell features. IHC supports upper GI primary.  -Her EGD was negative for primary malignancy. -her cancer is PD-L1 negative and MMR normal -she began First line FOLFOX on 05/04/21, tolerating very well overall. Dose reduced from C6 due to thrombocytopenia. -CT CAP from 08/10/21 showed decreased peritoneal thickening/stranding, resolution of ascites, and reduced size of inferior right liver implant.  No new lesion or evidence of progression.  She has responded well to treatment.  -she  completed 12 cycles of FOLFOX on 10/10/21, stopped due to neuropathy.  -She started maintenance Xeloda on October 29, 2021, she tolerated first cycle of chemotherapy very well.     2. Mild Anemia -found to have anemia while in the hospital -stable, hgb 9.7 today (10/24/21)   3. 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 She has significant decrease in sensation in her hands on tuning fork exam today. -oxali discontinued 10/10/21 -Overall stable, I encouraged her exercise, and taking over-the-counter vitamin B     Plan: -She is tolerating cycle 1 Xeloda very well, will continue and finish this Sunday. -I refilled her Xeloda -Lab and follow-up on February 23, before second cycle   No problem-specific Assessment & Plan notes found for this encounter.   SUMMARY OF ONCOLOGIC HISTORY: Oncology History  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[place])  04/21/2021 Imaging   CT AP w/o contrast  IMPRESSION: Findings highly suspicious for diffuse peritoneal carcinomatosis.   Suspected distal small bowel obstruction, with probable transition point in right lower quadrant.   Aortic Atherosclerosis (ICD10-I70.0).   04/22/2021 Imaging   US Abdomen  IMPRESSION: Small volume of lower abdominopelvic ascites.   04/24/2021 Pathology Results   Cytology  Specimen Submitted:  A. ASCITES, PARACENTESIS:   FINAL MICROSCOPIC DIAGNOSIS:  - Atypical cells present  DIAGNOSTIC COMMENTS:  Immunohistochemical stains show that the rare, mildly atypical cells are positive for calretinin and D2-40; while they are negative for PAX8, ER and MOC-31.  This immunoprofile is consistent with reactive mesothelial cells.    04/25/2021 Initial Diagnosis   Primary peritoneal  carcinomatosis ([place])   04/29/2021 Imaging   CT AP  IMPRESSION: 1. Moderate ascites, mild peritoneal thickening and diffuse  omental thickening/caking and smaller focal fluid/cystic areas within the RIGHT abdomen and LOWER pelvis, compatible with peritoneal carcinomatosis. Mild mesenteric stranding is nonspecific. 2. Small subserosal implants along the INFERIOR RIGHT liver. 3. Small LEFT pleural effusion and trace RIGHT pleural effusion with LEFT LOWER lung atelectasis. 4. Aortic Atherosclerosis (ICD10-I70.0).   04/30/2021 Pathology Results   FINAL MICROSCOPIC DIAGNOSIS:   A. 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   ADDENDUM:  By immunohistochemistry, HER-2 is EQUIVOCAL (2+).    FLOURESCENCE IN-SITU HYBRIDIZATION RESULTS:  GROUP 5:   HER2 **NEGATIVE**    05/04/2021 -  Chemotherapy   Patient is on Treatment Plan : GASTRIC FOLFOX q14d x 12 cycles     05/04/2021 Imaging   CT Chest  IMPRESSION: 1. Suspected segmental filling defects in the anterior segment right upper lobe and apical segment right upper lobe compatible with small pulmonary emboli. Clot burden appears small, although please note that today's examination was not a dedicated CT angiogram and as such might overestimate or underestimate pulmonary embolus. Positive for acute PE with CT evidence of right heart strain (RV/LV Ratio = 1.1) consistent with at least submassive (intermediate risk) PE. The presence of right heart strain has been associated with an increased risk of morbidity and mortality. Please refer to the "PE Focused" order set in EPIC. 2. No compelling  findings of metastatic disease to the chest. There is a small left pleural effusion, but no definite enhancement along the pleura to suggest malignant or exudative etiology at this time. 3. There is some new gas in the soft tissues of the left thoracoabdominal wall, compatible subcutaneous emphysema, probably related to recent laparoscopy or other procedure. 4. Hypodense thyroid nodules up to 2.1 cm in diameter. Recommend thyroid US (ref: J Am Coll Radiol. 2015 Feb;12(2): 143-50). 5. Left upper quadrant ascites, including fluid in the lesser sac. This is reduced compared to the prior CT abdomen of 04/29/2021. 6. Stable appearance of periportal edema and hypodensities along the posterior margin of the right hepatic lobe.   05/07/2021 Pathology Results   FINAL MICROSCOPIC DIAGNOSIS:   A. DUODENUM, BULB, BIOPSY:  - Polypoid duodenal mucosa with chronic inflammation, foveolar  metaplasia and reactive changes  - Negative for dysplasia or malignancy   Cytology: Specimen Submitted:  A. ESOPHAGUS, BRUSHING:   FINAL MICROSCOPIC DIAGNOSIS:  - Benign reactive/reparative changes    05/16/2021 Cancer Staging   Staging form: Soft Tissue Sarcoma of the Abdomen and Thoracic Visceral Organs, AJCC 8th Edition - Clinical: cTX, cN0, pM1 - Signed by [name], MD on 05/16/2021    08/10/2021 Imaging   CT CAP IMPRESSION: 1. Subserosal implants along the inferior right liver are no longer apparent or decreased in size when compared to prior exam. 2. Interval resolution of abdominal ascites. Decreased peritoneal thickening and stranding. 3. Interval resolution of small left pleural effusion. 4.  Aortic Atherosclerosis (ICD10-I70.0).  INTERVAL HISTORY:  Brenda Stewart is scheduled for a phone visit for follow up of peritoneal carcinomatosis.  She started Xeloda on 10/29/2021, she has been tolerating very well so far.  She denies any gastric discomfort, diarrhea, skin rash, or other noticeable side  effects.  She has good appetite and energy level, no other new complaints.    All other systems were reviewed with the patient and are negative.  MEDICAL HISTORY:  Past Medical History:  Diagnosis Date   Acid reflux    Hypertension     SURGICAL HISTORY: Past Surgical History:  Procedure Laterality Date   BIOPSY  05/07/2021   Procedure: BIOPSY;  Surgeon: [name], MD;  Location: [facility];  Service: Endoscopy;;   BUNIONECTOMY Bilateral    ESOPHAGEAL BRUSHING  05/07/2021   Procedure: ESOPHAGEAL BRUSHING;  Surgeon: [name], MD;  Location: [facility];  Service: Endoscopy;;   ESOPHAGOGASTRODUODENOSCOPY (EGD) WITH PROPOFOL N/A 05/07/2021   Procedure: ESOPHAGOGASTRODUODENOSCOPY (EGD) WITH PROPOFOL;  Surgeon: [name], MD;  Location: [facility];  Service: Endoscopy;  Laterality: N/A;   HAND TENDON SURGERY Bilateral    IR IMAGING GUIDED PORT INSERTION  04/26/2021   IR PARACENTESIS  04/24/2021   LAPAROSCOPY N/A 04/30/2021   Procedure: LAPAROSCOPY DIAGNOSTIC WITH BIOPSIES;  Surgeon: Lafonda Mosses, MD;  Location: [facility];  Service: Gynecology;  Laterality: N/A;   PARTIAL HYSTERECTOMY      I have reviewed the social history and family history with the patient and they are unchanged from previous note.  ALLERGIES:  has No Known Allergies.  MEDICATIONS:  Current Outpatient Medications  Medication Sig Dispense Refill   amLODipine (NORVASC) 10 MG tablet Take 5 mg by mouth daily. Take 1 tab by mouth daily (Hold Dose for Systolic BP Less than 102; Avoid Grapefruit or its juice)     apixaban (ELIQUIS) 5 MG TABS tablet Take 1 tablet (5 mg total) by mouth 2 (two) times daily. 60 tablet 2   ascorbic acid (VITAMIN C) 250 MG CHEW Chew 500 mg by mouth daily.     capecitabine (XELODA) 500 MG tablet Take 4 tablets (2,000 mg total) by mouth 2 (two) times daily after a meal. Take for 14 days on, 7 days off. Repeat every 21 days. 112 tablet 0   Cholecalciferol (VITAMIN D3) 50 MCG (2000 UT)  CHEW Chew 2 tablets by mouth daily.     lidocaine-prilocaine (EMLA) cream Apply 1 application topically as needed. 30 g 0   lisinopril (ZESTRIL) 40 MG tablet Take 40 mg by mouth daily. TAKE 1 TAB BY MOUTH DAILY (HOLD DOSE FOR SYSTOLIC BLOOD PRESSURE LESS THAN 100; AVOID POTASSIUM SUPPLEMENT/ARTIFICIAL SALT) (HOLD DOSE FOR SYSTOLIC BLOOD PRESSURE LESS THAN 100; AVOID POTASSIUM SUPPLEMENTS/ARTIFICIAL SALT)     prochlorperazine (COMPAZINE) 10 MG tablet Take 1 tablet (10 mg total) by mouth every 6 (six) hours as needed for nausea or vomiting. 30 tablet 2   sennosides-docusate sodium (SENOKOT-S) 8.6-50 MG tablet Take 2 tablets by mouth daily as needed for constipation.     No current facility-administered medications for this visit.    PHYSICAL EXAMINATION: ECOG PERFORMANCE STATUS: 1 - Symptomatic but completely ambulatory  There were no vitals filed for this visit.  Wt Readings from Last 3 Encounters:  10/24/21 183 lb 3.2 oz (83.1 kg)  10/10/21 184 lb 6.4 oz (83.6 kg)  09/11/21 177 lb 3.2 oz (80.4 kg)    No exam   LABORATORY DATA:  I have reviewed the data as listed CBC Latest  Ref Rng & Units 10/24/2021 10/10/2021 09/26/2021  WBC 4.0 - 10.5 K/uL 7.0 6.2 6.5  Hemoglobin 12.0 - 15.0 g/dL 9.7(L) 9.4(L) 10.1(L)  Hematocrit 36.0 - 46.0 % 29.2(L) 27.8(L) 29.9(L)  Platelets 150 - 400 K/uL 142(L) 143(L) 133(L)     CMP Latest Ref Rng & Units 10/24/2021 10/10/2021 09/26/2021  Glucose 70 - 99 mg/dL 127(H) 96 115(H)  BUN 8 - 23 mg/dL _0 Creatinine 0.44 - 1.00 mg/dL 1.06(H) 0.98 0.95  Sodium 135 - 145 mmol/L 138 141 138  Potassium 3.5 - 5.1 mmol/L 3.8 3.7 3.8  Chloride 98 - 111 mmol/L 105 106 104  CO2 22 - 32 mmol/L _1 Calcium 8.9 - 10.3 mg/dL 9.6 9.2 9.3  Total Protein 6.5 - 8.1 g/dL 8.1 7.6 8.0  Total Bilirubin 0.3 - 1.2 mg/dL 0.6 0.4 0.6  Alkaline Phos 38 - 126 U/L 138(H) 130(H) 127(H)  AST 15 - 41 U/L _2 ALT 0 - 44 U/L _3 RADIOGRAPHIC STUDIES: I have personally  reviewed the radiological images as listed and agreed with the findings in the report. No results found.    No orders of the defined types were placed in this encounter.  All questions were answered. The patient knows to call the clinic with any problems, questions or concerns. No barriers to learning was detected. The total time spent in the appointment was 30 minutes.  I discussed the assessment and treatment plan with the patient. The patient was provided an opportunity to ask questions and all were answered. The patient agreed with the plan and demonstrated an understanding of the instructions.   The patient was advised to call back or seek an in-person evaluation if the symptoms worsen or if the condition fails to improve as anticipated.  I provided 12 minutes of non face-to-face telephone visit time during this encounter, and > 50% was spent counseling as documented under my assessment & plan.    [name], MD 11/06/2021   I, Wilburn Mylar, am acting as scribe for [name], MD.   I have reviewed the above documentation for accuracy and completeness, and I agree with the above.

## 2021-11-07 ENCOUNTER — Other Ambulatory Visit (HOSPITAL_COMMUNITY): Payer: Self-pay

## 2021-11-09 ENCOUNTER — Other Ambulatory Visit (HOSPITAL_COMMUNITY): Payer: Self-pay

## 2021-11-15 ENCOUNTER — Encounter: Payer: Self-pay | Admitting: Hematology

## 2021-11-15 ENCOUNTER — Inpatient Hospital Stay: Payer: Medicare Other

## 2021-11-15 ENCOUNTER — Inpatient Hospital Stay (HOSPITAL_BASED_OUTPATIENT_CLINIC_OR_DEPARTMENT_OTHER): Payer: Medicare Other | Admitting: Hematology

## 2021-11-15 ENCOUNTER — Other Ambulatory Visit: Payer: Self-pay

## 2021-11-15 ENCOUNTER — Other Ambulatory Visit (HOSPITAL_COMMUNITY): Payer: Self-pay

## 2021-11-15 VITALS — BP 131/64 | HR 85 | Temp 98.6°F | Resp 18 | Ht 67.0 in | Wt 187.7 lb

## 2021-11-15 DIAGNOSIS — C482 Malignant neoplasm of peritoneum, unspecified: Secondary | ICD-10-CM

## 2021-11-15 DIAGNOSIS — D490 Neoplasm of unspecified behavior of digestive system: Secondary | ICD-10-CM

## 2021-11-15 DIAGNOSIS — C786 Secondary malignant neoplasm of retroperitoneum and peritoneum: Secondary | ICD-10-CM | POA: Diagnosis not present

## 2021-11-15 DIAGNOSIS — Z95828 Presence of other vascular implants and grafts: Secondary | ICD-10-CM

## 2021-11-15 DIAGNOSIS — C801 Malignant (primary) neoplasm, unspecified: Secondary | ICD-10-CM | POA: Diagnosis not present

## 2021-11-15 LAB — CBC WITH DIFFERENTIAL (CANCER CENTER ONLY)
Abs Immature Granulocytes: 0.01 10*3/uL (ref 0.00–0.07)
Basophils Absolute: 0 10*3/uL (ref 0.0–0.1)
Basophils Relative: 1 %
Eosinophils Absolute: 0.2 10*3/uL (ref 0.0–0.5)
Eosinophils Relative: 3 %
HCT: 27.3 % — ABNORMAL LOW (ref 36.0–46.0)
Hemoglobin: 9.2 g/dL — ABNORMAL LOW (ref 12.0–15.0)
Immature Granulocytes: 0 %
Lymphocytes Relative: 47 %
Lymphs Abs: 2.2 10*3/uL (ref 0.7–4.0)
MCH: 36.2 pg — ABNORMAL HIGH (ref 26.0–34.0)
MCHC: 33.7 g/dL (ref 30.0–36.0)
MCV: 107.5 fL — ABNORMAL HIGH (ref 80.0–100.0)
Monocytes Absolute: 0.5 10*3/uL (ref 0.1–1.0)
Monocytes Relative: 12 %
Neutro Abs: 1.7 10*3/uL (ref 1.7–7.7)
Neutrophils Relative %: 37 %
Platelet Count: 159 10*3/uL (ref 150–400)
RBC: 2.54 MIL/uL — ABNORMAL LOW (ref 3.87–5.11)
RDW: 14.8 % (ref 11.5–15.5)
WBC Count: 4.6 10*3/uL (ref 4.0–10.5)
nRBC: 0 % (ref 0.0–0.2)

## 2021-11-15 LAB — CMP (CANCER CENTER ONLY)
ALT: 9 U/L (ref 0–44)
AST: 21 U/L (ref 15–41)
Albumin: 4.2 g/dL (ref 3.5–5.0)
Alkaline Phosphatase: 106 U/L (ref 38–126)
Anion gap: 8 (ref 5–15)
BUN: 27 mg/dL — ABNORMAL HIGH (ref 8–23)
CO2: 25 mmol/L (ref 22–32)
Calcium: 9.2 mg/dL (ref 8.9–10.3)
Chloride: 107 mmol/L (ref 98–111)
Creatinine: 1.06 mg/dL — ABNORMAL HIGH (ref 0.44–1.00)
GFR, Estimated: 57 mL/min — ABNORMAL LOW (ref >60)
Glucose, Bld: 105 mg/dL — ABNORMAL HIGH (ref 70–99)
Potassium: 3.8 mmol/L (ref 3.5–5.1)
Sodium: 140 mmol/L (ref 135–145)
Total Bilirubin: 0.4 mg/dL (ref 0.3–1.2)
Total Protein: 7.9 g/dL (ref 6.5–8.1)

## 2021-11-15 MED ORDER — SODIUM CHLORIDE 0.9% FLUSH
10.0000 mL | Freq: Once | INTRAVENOUS | Status: AC
  Administered 2021-11-15: 10 mL

## 2021-11-15 MED ORDER — HEPARIN SOD (PORK) LOCK FLUSH 100 UNIT/ML IV SOLN
500.0000 [IU] | Freq: Once | INTRAVENOUS | Status: AC
  Administered 2021-11-15: 500 [IU]

## 2021-11-15 NOTE — Progress Notes (Signed)
Brenda Stewart   Telephone:([phone] Fax:(336) [phone]   Clinic Follow up Note   Patient Care Team: Pcp, No as PCP - General [name], MD as Consulting Physician (Hematology)  Date of Service:  11/15/2021  CHIEF COMPLAINT: f/u of peritoneal carcinomatosis  CURRENT THERAPY:  Xeloda, 2000 mg BID days 1-14 q21days, started 10/29/21  ASSESSMENT & PLAN:  Brenda Stewart is a 71 y.o. female with   1. 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 CT AP 04/21/21 showed findings suspicious for diffuse peritoneal carcinomatosis. She was admitted and underwent paracentesis on 04/24/21 showing atypical cells. -Diagnostic laparoscopy on 04/30/21 with pathology from peritoneum biopsy showing metastatic carcinoma with signet ring cell features. IHC supports upper GI primary.  -Her EGD was negative for primary malignancy. -her cancer is PD-L1 negative and MMR normal -CT CAP from 08/10/21 showed decreased peritoneal thickening/stranding, resolution of ascites, and reduced size of inferior right liver implant.  No new lesion or evidence of progression.  She has responded well to treatment.  -she completed 12 cycles of FOLFOX 05/04/21-10/10/21, tolerated very well overall but stopped due to neuropathy.  -She started maintenance Xeloda on 10/29/21, she tolerated first cycle very well with no noticeable side effects.  -Lab reviewed, adequate for treatment.  Will continue Xeloda at same dose    2. Mild Anemia -found to have anemia while in the hospital -stable, hgb 9.2 today (11/15/21)   3. 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 She still reports residual tingling in her fingertips and sometimes her toes, denies fine motor function deficits.  -Overall stable, I  encouraged her exercise, and take over-the-counter vitamin B complex   Plan: -continue Xeloda, start cycle 2 on 2/27  -Lab, flush, and f/u in 3 and 6 weeks   No problem-specific Assessment & Plan notes found for this encounter.   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   Suspected distal small bowel obstruction, with probable transition point in right lower quadrant.   Aortic Atherosclerosis (ICD10-I70.0).   04/22/2021 Imaging   US Abdomen  IMPRESSION: Small volume of lower abdominopelvic ascites.   04/24/2021 Pathology Results   Cytology  Specimen Submitted:  A. ASCITES, PARACENTESIS:   FINAL MICROSCOPIC DIAGNOSIS:  - Atypical cells present  DIAGNOSTIC COMMENTS:  Immunohistochemical stains show that the rare, mildly atypical cells are positive for calretinin and D2-40; while they are negative for PAX8, ER and MOC-31.  This immunoprofile is consistent with reactive mesothelial cells.    04/25/2021 Initial Diagnosis   Primary peritoneal carcinomatosis ([place])   04/29/2021 Imaging   CT AP  IMPRESSION: 1. Moderate ascites, mild peritoneal thickening and diffuse omental thickening/caking and smaller focal fluid/cystic areas within the RIGHT abdomen and LOWER pelvis, compatible with peritoneal carcinomatosis. Mild mesenteric stranding is nonspecific. 2. Small subserosal implants along the INFERIOR RIGHT liver. 3. Small LEFT pleural effusion and trace RIGHT pleural effusion with LEFT LOWER lung atelectasis. 4. Aortic Atherosclerosis (ICD10-I70.0).   04/30/2021 Pathology Results   FINAL MICROSCOPIC DIAGNOSIS:   A. 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 PERTIONEUM, ANTERIOR, EXCISION:  -  Metastatic carcinoma with signet ring cell features  -  See  comment   COMMENT:   By immunohistochemistry (performed on blocks A2 and B1), the neoplastic cells are positive for cytokeratin AE1/3, cytokeratin 20 and CDX2 but negative for TTF-1, ER, GATA3, PAX 8 and cytokeratin 7.  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   ADDENDUM:  By immunohistochemistry, HER-2 is EQUIVOCAL (2+).    FLOURESCENCE IN-SITU HYBRIDIZATION RESULTS:  GROUP 5:   HER2 **NEGATIVE**    05/04/2021 -  Chemotherapy   Patient is on Treatment Plan : GASTRIC FOLFOX q14d x 12 cycles     05/04/2021 Imaging   CT Chest  IMPRESSION: 1. Suspected segmental filling defects in the anterior segment right upper lobe and apical segment right upper lobe compatible with small pulmonary emboli. Clot burden appears small, although please note that today's examination was not a dedicated CT angiogram and as such might overestimate or underestimate pulmonary embolus. Positive for acute PE with CT evidence of right heart strain (RV/LV Ratio = 1.1) consistent with at least submassive (intermediate risk) PE. The presence of right heart strain has been associated with an increased risk of morbidity and mortality. Please refer to the "PE Focused" order set in EPIC. 2. No compelling findings of metastatic disease to the chest. There is a small left pleural effusion, but no definite enhancement along the pleura to suggest malignant or exudative etiology at this time. 3. There is some new gas in the soft tissues of the left thoracoabdominal wall, compatible subcutaneous emphysema, probably related to recent laparoscopy or other procedure. 4. Hypodense thyroid nodules up to 2.1 cm in diameter. Recommend thyroid US (ref: J Am Coll Radiol. 2015 Feb;12(2): 143-50). 5. Left upper quadrant ascites, including fluid in the lesser sac. This is reduced compared to the prior CT abdomen of 04/29/2021. 6. Stable appearance of periportal edema and  hypodensities along the posterior margin of the right hepatic lobe.   05/07/2021 Pathology Results   FINAL MICROSCOPIC DIAGNOSIS:   A. DUODENUM, BULB, BIOPSY:  - Polypoid duodenal mucosa with chronic inflammation, foveolar  metaplasia and reactive changes  - Negative for dysplasia or malignancy   Cytology: Specimen Submitted:  A. ESOPHAGUS, BRUSHING:   FINAL MICROSCOPIC DIAGNOSIS:  - Benign reactive/reparative changes    05/16/2021 Cancer Staging   Staging form: Soft Tissue Sarcoma of the Abdomen and Thoracic Visceral Organs, AJCC 8th Edition - Clinical: cTX, cN0, pM1 - Signed by [name], MD on 05/16/2021    08/10/2021 Imaging   CT CAP IMPRESSION: 1. Subserosal implants along the inferior right liver are no longer apparent or decreased in size when compared to prior exam. 2. Interval resolution of abdominal ascites. Decreased peritoneal thickening and stranding. 3. Interval resolution of small left pleural effusion. 4.  Aortic Atherosclerosis (ICD10-I70.0).        INTERVAL HISTORY:  Brenda Stewart is here for a follow up of peritoneal carcinomatosis. She was last seen by me on 10/24/21 with phone visit in the interim. She presents to the clinic alone. She reports she has tolerated Xeloda very well so far with no noticeable side effects. She does report residual neuropathy.   All other systems were reviewed with the patient and are negative.  MEDICAL HISTORY:  Past Medical History:  Diagnosis Date   Acid reflux    Hypertension     SURGICAL HISTORY: Past Surgical History:  Procedure Laterality Date   BIOPSY  05/07/2021  Procedure: BIOPSY;  Surgeon: [name], MD;  Location: [facility];  Service: Endoscopy;;   BUNIONECTOMY Bilateral    ESOPHAGEAL BRUSHING  05/07/2021   Procedure: ESOPHAGEAL BRUSHING;  Surgeon: [name], MD;  Location: [facility];  Service: Endoscopy;;   ESOPHAGOGASTRODUODENOSCOPY (EGD) WITH PROPOFOL N/A 05/07/2021   Procedure:  ESOPHAGOGASTRODUODENOSCOPY (EGD) WITH PROPOFOL;  Surgeon: [name], MD;  Location: [facility];  Service: Endoscopy;  Laterality: N/A;   HAND TENDON SURGERY Bilateral    IR IMAGING GUIDED PORT INSERTION  04/26/2021   IR PARACENTESIS  04/24/2021   LAPAROSCOPY N/A 04/30/2021   Procedure: LAPAROSCOPY DIAGNOSTIC WITH BIOPSIES;  Surgeon: Lafonda Mosses, MD;  Location: [facility];  Service: Gynecology;  Laterality: N/A;   PARTIAL HYSTERECTOMY      I have reviewed the social history and family history with the patient and they are unchanged from previous note.  ALLERGIES:  has No Known Allergies.  MEDICATIONS:  Current Outpatient Medications  Medication Sig Dispense Refill   amLODipine (NORVASC) 10 MG tablet Take 5 mg by mouth daily. Take 1 tab by mouth daily (Hold Dose for Systolic BP Less than 409; Avoid Grapefruit or its juice)     apixaban (ELIQUIS) 5 MG TABS tablet Take 1 tablet (5 mg total) by mouth 2 (two) times daily. 60 tablet 2   ascorbic acid (VITAMIN C) 250 MG CHEW Chew 500 mg by mouth daily.     capecitabine (XELODA) 500 MG tablet Take 4 tablets (2,000 mg total) by mouth 2 (two) times daily after a meal. Take for 14 days on, 7 days off. Repeat every 21 days. 112 tablet 1   Cholecalciferol (VITAMIN D3) 50 MCG (2000 UT) CHEW Chew 2 tablets by mouth daily.     lidocaine-prilocaine (EMLA) cream Apply 1 application topically as needed. 30 g 0   lisinopril (ZESTRIL) 40 MG tablet Take 40 mg by mouth daily. TAKE 1 TAB BY MOUTH DAILY (HOLD DOSE FOR SYSTOLIC BLOOD PRESSURE LESS THAN 100; AVOID POTASSIUM SUPPLEMENT/ARTIFICIAL SALT) (HOLD DOSE FOR SYSTOLIC BLOOD PRESSURE LESS THAN 100; AVOID POTASSIUM SUPPLEMENTS/ARTIFICIAL SALT)     prochlorperazine (COMPAZINE) 10 MG tablet Take 1 tablet (10 mg total) by mouth every 6 (six) hours as needed for nausea or vomiting. 30 tablet 2   sennosides-docusate sodium (SENOKOT-S) 8.6-50 MG tablet Take 2 tablets by mouth daily as needed for constipation.     No  current facility-administered medications for this visit.    PHYSICAL EXAMINATION: ECOG PERFORMANCE STATUS: 1 - Symptomatic but completely ambulatory  Vitals:   11/15/21 1050  BP: 131/64  Pulse: 85  Resp: 18  Temp: 98.6 F (37 C)  SpO2: 99%   Wt Readings from Last 3 Encounters:  11/15/21 187 lb 11.2 oz (85.1 kg)  10/24/21 183 lb 3.2 oz (83.1 kg)  10/10/21 184 lb 6.4 oz (83.6 kg)     GENERAL:alert, no distress and comfortable SKIN: skin color normal, no rashes or significant lesions EYES: normal, Conjunctiva are pink and non-injected, sclera clear  NEURO: alert & oriented x 3 with fluent speech  LABORATORY DATA:  I have reviewed the data as listed CBC Latest Ref Rng & Units 11/15/2021 10/24/2021 10/10/2021  WBC 4.0 - 10.5 K/uL 4.6 7.0 6.2  Hemoglobin 12.0 - 15.0 g/dL 9.2(L) 9.7(L) 9.4(L)  Hematocrit 36.0 - 46.0 % 27.3(L) 29.2(L) 27.8(L)  Platelets 150 - 400 K/uL 159 142(L) 143(L)     CMP Latest Ref Rng & Units 11/15/2021 10/24/2021 10/10/2021  Glucose 70 - 99 mg/dL 105(H)  127(H) 96  BUN 8 - 23 mg/dL 27(H) 19 13  Creatinine 0.44 - 1.00 mg/dL 1.06(H) 1.06(H) 0.98  Sodium 135 - 145 mmol/L 140 138 141  Potassium 3.5 - 5.1 mmol/L 3.8 3.8 3.7  Chloride 98 - 111 mmol/L 107 105 106  CO2 22 - 32 mmol/L _0 Calcium 8.9 - 10.3 mg/dL 9.2 9.6 9.2  Total Protein 6.5 - 8.1 g/dL 7.9 8.1 7.6  Total Bilirubin 0.3 - 1.2 mg/dL 0.4 0.6 0.4  Alkaline Phos 38 - 126 U/L 106 138(H) 130(H)  AST 15 - 41 U/L _1 ALT 0 - 44 U/L _2 RADIOGRAPHIC STUDIES: I have personally reviewed the radiological images as listed and agreed with the findings in the report. No results found.    No orders of the defined types were placed in this encounter.  All questions were answered. The patient knows to call the clinic with any problems, questions or concerns. No barriers to learning was detected.      [name], MD 11/15/2021   I, Wilburn Mylar, am acting as scribe for [name],  MD.   I have reviewed the above documentation for accuracy and completeness, and I agree with the above.

## 2021-11-16 LAB — CA 125: Cancer Antigen (CA) 125: 6.1 U/mL (ref 0.0–38.1)

## 2021-11-27 ENCOUNTER — Other Ambulatory Visit (HOSPITAL_COMMUNITY): Payer: Self-pay

## 2021-12-03 NOTE — Progress Notes (Unsigned)
Brenda Stewart   Telephone:([phone] Fax:(336) [phone]   Clinic Follow up Note   Patient Care Team: Pcp, No as PCP - General [name], MD as Consulting Physician (Hematology) 12/03/2021  CHIEF COMPLAINT: Follow-up peritoneal carcinomatosis  SUMMARY OF ONCOLOGIC HISTORY: Oncology History  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[place])  04/21/2021 Imaging   CT AP w/o contrast  IMPRESSION: Findings highly suspicious for diffuse peritoneal carcinomatosis.   Suspected distal small bowel obstruction, with probable transition point in right lower quadrant.   Aortic Atherosclerosis (ICD10-I70.0).   04/22/2021 Imaging   US Abdomen  IMPRESSION: Small volume of lower abdominopelvic ascites.   04/24/2021 Pathology Results   Cytology  Specimen Submitted:  A. ASCITES, PARACENTESIS:   FINAL MICROSCOPIC DIAGNOSIS:  - Atypical cells present  DIAGNOSTIC COMMENTS:  Immunohistochemical stains show that the rare, mildly atypical cells are positive for calretinin and D2-40; while they are negative for PAX8, ER and MOC-31.  This immunoprofile is consistent with reactive mesothelial cells.    04/25/2021 Initial Diagnosis   Primary peritoneal carcinomatosis ([place])   04/29/2021 Imaging   CT AP  IMPRESSION: 1. Moderate ascites, mild peritoneal thickening and diffuse omental thickening/caking and smaller focal fluid/cystic areas within the RIGHT abdomen and LOWER pelvis, compatible with peritoneal carcinomatosis. Mild mesenteric stranding is nonspecific. 2. Small subserosal implants along the INFERIOR RIGHT liver. 3. Small LEFT pleural effusion and trace RIGHT pleural effusion with LEFT LOWER lung atelectasis. 4. Aortic Atherosclerosis (ICD10-I70.0).   04/30/2021 Pathology Results   FINAL MICROSCOPIC DIAGNOSIS:   A. 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 PERTIONEUM, ANTERIOR, EXCISION:  -  Metastatic carcinoma with signet  ring cell features  -  See comment   COMMENT:   By immunohistochemistry (performed on blocks A2 and B1), the neoplastic cells are positive for cytokeratin AE1/3, cytokeratin 20 and CDX2 but negative for TTF-1, ER, GATA3, PAX 8 and cytokeratin 7.  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   ADDENDUM:  By immunohistochemistry, HER-2 is EQUIVOCAL (2+).    FLOURESCENCE IN-SITU HYBRIDIZATION RESULTS:  GROUP 5:   HER2 **NEGATIVE**    05/04/2021 -  Chemotherapy   Patient is on Treatment Plan : GASTRIC FOLFOX q14d x 12 cycles     05/04/2021 Imaging   CT Chest  IMPRESSION: 1. Suspected segmental filling defects in the anterior segment right upper lobe and apical segment right upper lobe compatible with small pulmonary emboli. Clot burden appears small, although please note that today's examination was not a dedicated CT angiogram and as such might overestimate or underestimate pulmonary embolus. Positive for acute PE with CT evidence of right heart strain (RV/LV Ratio = 1.1) consistent with at least submassive (intermediate risk) PE. The presence of right heart strain has been associated with an increased risk of morbidity and mortality. Please refer to the "PE Focused" order set in EPIC. 2. No compelling findings of metastatic disease to the chest. There is a small left pleural effusion, but no definite enhancement along the pleura to suggest malignant or exudative etiology at this time. 3. There is some new gas in the soft tissues of the left thoracoabdominal wall, compatible subcutaneous emphysema, probably related to recent laparoscopy or other procedure. 4. Hypodense thyroid nodules up to 2.1 cm in diameter. Recommend thyroid US (ref: J Am Coll Radiol. 2015 Feb;12(2): 143-50). 5. Left upper quadrant ascites, including fluid  in the lesser sac. This is reduced compared to the prior CT abdomen of 04/29/2021. 6. Stable  appearance of periportal edema and hypodensities along the posterior margin of the right hepatic lobe.   05/07/2021 Pathology Results   FINAL MICROSCOPIC DIAGNOSIS:   A. DUODENUM, BULB, BIOPSY:  - Polypoid duodenal mucosa with chronic inflammation, foveolar  metaplasia and reactive changes  - Negative for dysplasia or malignancy   Cytology: Specimen Submitted:  A. ESOPHAGUS, BRUSHING:   FINAL MICROSCOPIC DIAGNOSIS:  - Benign reactive/reparative changes    05/16/2021 Cancer Staging   Staging form: Soft Tissue Sarcoma of the Abdomen and Thoracic Visceral Organs, AJCC 8th Edition - Clinical: cTX, cN0, pM1 - Signed by [name], MD on 05/16/2021    08/10/2021 Imaging   CT CAP IMPRESSION: 1. Subserosal implants along the inferior right liver are no longer apparent or decreased in size when compared to prior exam. 2. Interval resolution of abdominal ascites. Decreased peritoneal thickening and stranding. 3. Interval resolution of small left pleural effusion. 4.  Aortic Atherosclerosis (ICD10-I70.0).       CURRENT THERAPY: Xeloda 2000 mg twice daily days 1-14 q. 21 days, starting 10/29/2021  INTERVAL HISTORY: Brenda Stewart returns for follow-up as scheduled, last seen by Dr. Burr Medico 11/15/2021.   REVIEW OF SYSTEMS:   Constitutional: Denies fevers, chills or abnormal weight loss Eyes: Denies blurriness of vision Ears, nose, mouth, throat, and face: Denies mucositis or sore throat Respiratory: Denies cough, dyspnea or wheezes Cardiovascular: Denies palpitation, chest discomfort or lower extremity swelling Gastrointestinal:  Denies nausea, heartburn or change in bowel habits Skin: Denies abnormal skin rashes Lymphatics: Denies new lymphadenopathy or easy bruising Neurological:Denies numbness, tingling or new weaknesses Behavioral/Psych: Mood is stable, no new changes  All other systems were reviewed with the patient and are negative.  MEDICAL HISTORY:  Past Medical History:  Diagnosis  Date   Acid reflux    Hypertension     SURGICAL HISTORY: Past Surgical History:  Procedure Laterality Date   BIOPSY  05/07/2021   Procedure: BIOPSY;  Surgeon: [name], MD;  Location: [facility];  Service: Endoscopy;;   BUNIONECTOMY Bilateral    ESOPHAGEAL BRUSHING  05/07/2021   Procedure: ESOPHAGEAL BRUSHING;  Surgeon: [name], MD;  Location: [facility];  Service: Endoscopy;;   ESOPHAGOGASTRODUODENOSCOPY (EGD) WITH PROPOFOL N/A 05/07/2021   Procedure: ESOPHAGOGASTRODUODENOSCOPY (EGD) WITH PROPOFOL;  Surgeon: [name], MD;  Location: [facility];  Service: Endoscopy;  Laterality: N/A;   HAND TENDON SURGERY Bilateral    IR IMAGING GUIDED PORT INSERTION  04/26/2021   IR PARACENTESIS  04/24/2021   LAPAROSCOPY N/A 04/30/2021   Procedure: LAPAROSCOPY DIAGNOSTIC WITH BIOPSIES;  Surgeon: Lafonda Mosses, MD;  Location: [facility];  Service: Gynecology;  Laterality: N/A;   PARTIAL HYSTERECTOMY      I have reviewed the social history and family history with the patient and they are unchanged from previous note.  ALLERGIES:  has No Known Allergies.  MEDICATIONS:  Current Outpatient Medications  Medication Sig Dispense Refill   amLODipine (NORVASC) 10 MG tablet Take 5 mg by mouth daily. Take 1 tab by mouth daily (Hold Dose for Systolic BP Less than 518; Avoid Grapefruit or its juice)     apixaban (ELIQUIS) 5 MG TABS tablet Take 1 tablet (5 mg total) by mouth 2 (two) times daily. 60 tablet 2   ascorbic acid (VITAMIN C) 250 MG CHEW Chew 500 mg by mouth daily.     capecitabine (XELODA) 500 MG tablet Take 4 tablets (  2,000 mg total) by mouth 2 (two) times daily after a meal. Take for 14 days on, 7 days off. Repeat every 21 days. 112 tablet 1   Cholecalciferol (VITAMIN D3) 50 MCG (2000 UT) CHEW Chew 2 tablets by mouth daily.     lidocaine-prilocaine (EMLA) cream Apply 1 application topically as needed. 30 g 0   lisinopril (ZESTRIL) 40 MG tablet Take 40 mg by mouth daily. TAKE 1 TAB BY  MOUTH DAILY (HOLD DOSE FOR SYSTOLIC BLOOD PRESSURE LESS THAN 100; AVOID POTASSIUM SUPPLEMENT/ARTIFICIAL SALT) (HOLD DOSE FOR SYSTOLIC BLOOD PRESSURE LESS THAN 100; AVOID POTASSIUM SUPPLEMENTS/ARTIFICIAL SALT)     prochlorperazine (COMPAZINE) 10 MG tablet Take 1 tablet (10 mg total) by mouth every 6 (six) hours as needed for nausea or vomiting. 30 tablet 2   sennosides-docusate sodium (SENOKOT-S) 8.6-50 MG tablet Take 2 tablets by mouth daily as needed for constipation.     No current facility-administered medications for this visit.    PHYSICAL EXAMINATION: ECOG PERFORMANCE STATUS: {CHL ONC ECOG PS:[id number]}  There were no vitals filed for this visit. There were no vitals filed for this visit.  GENERAL:alert, no distress and comfortable SKIN: skin color, texture, turgor are normal, no rashes or significant lesions EYES: normal, Conjunctiva are pink and non-injected, sclera clear OROPHARYNX:no exudate, no erythema and lips, buccal mucosa, and tongue normal  NECK: supple, thyroid normal size, non-tender, without nodularity LYMPH:  no palpable lymphadenopathy in the cervical, axillary or inguinal LUNGS: clear to auscultation and percussion with normal breathing effort HEART: regular rate & rhythm and no murmurs and no lower extremity edema ABDOMEN:abdomen soft, non-tender and normal bowel sounds Musculoskeletal:no cyanosis of digits and no clubbing  NEURO: alert & oriented x 3 with fluent speech, no focal motor/sensory deficits  LABORATORY DATA:  I have reviewed the data as listed CBC Latest Ref Rng & Units 11/15/2021 10/24/2021 10/10/2021  WBC 4.0 - 10.5 K/uL 4.6 7.0 6.2  Hemoglobin 12.0 - 15.0 g/dL 9.2(L) 9.7(L) 9.4(L)  Hematocrit 36.0 - 46.0 % 27.3(L) 29.2(L) 27.8(L)  Platelets 150 - 400 K/uL 159 142(L) 143(L)     CMP Latest Ref Rng & Units 11/15/2021 10/24/2021 10/10/2021  Glucose 70 - 99 mg/dL 105(H) 127(H) 96  BUN 8 - 23 mg/dL 27(H) 19 13  Creatinine 0.44 - 1.00 mg/dL 1.06(H)  1.06(H) 0.98  Sodium 135 - 145 mmol/L 140 138 141  Potassium 3.5 - 5.1 mmol/L 3.8 3.8 3.7  Chloride 98 - 111 mmol/L 107 105 106  CO2 22 - 32 mmol/L _0 Calcium 8.9 - 10.3 mg/dL 9.2 9.6 9.2  Total Protein 6.5 - 8.1 g/dL 7.9 8.1 7.6  Total Bilirubin 0.3 - 1.2 mg/dL 0.4 0.6 0.4  Alkaline Phos 38 - 126 U/L 106 138(H) 130(H)  AST 15 - 41 U/L _1 ALT 0 - 44 U/L _2 RADIOGRAPHIC STUDIES: I have personally reviewed the radiological images as listed and agreed with the findings in the report. No results found.   ASSESSMENT & PLAN:  No problem-specific Assessment & Plan notes found for this encounter.   No orders of the defined types were placed in this encounter.  All questions were answered. The patient knows to call the clinic with any problems, questions or concerns. No barriers to learning was detected. I spent {CHL ONC TIME VISIT - [id number]} counseling the patient face to face. The total time spent in the appointment was {CHL ONC TIME VISIT - TZGYF:[id number]}  and more than 50% was on counseling and review of test results     Alla Feeling, NP 12/03/21

## 2021-12-04 ENCOUNTER — Other Ambulatory Visit (HOSPITAL_COMMUNITY): Payer: Self-pay

## 2021-12-05 ENCOUNTER — Inpatient Hospital Stay: Payer: Medicare Other

## 2021-12-05 ENCOUNTER — Other Ambulatory Visit (HOSPITAL_COMMUNITY): Payer: Self-pay

## 2021-12-05 ENCOUNTER — Inpatient Hospital Stay: Payer: Medicare Other | Attending: Hematology | Admitting: Nurse Practitioner

## 2021-12-05 ENCOUNTER — Other Ambulatory Visit: Payer: Self-pay

## 2021-12-05 ENCOUNTER — Encounter: Payer: Self-pay | Admitting: Nurse Practitioner

## 2021-12-05 VITALS — BP 134/65 | HR 88 | Temp 97.8°F | Resp 16 | Wt 188.2 lb

## 2021-12-05 DIAGNOSIS — I700 Atherosclerosis of aorta: Secondary | ICD-10-CM | POA: Diagnosis not present

## 2021-12-05 DIAGNOSIS — Z7901 Long term (current) use of anticoagulants: Secondary | ICD-10-CM | POA: Diagnosis not present

## 2021-12-05 DIAGNOSIS — C801 Malignant (primary) neoplasm, unspecified: Secondary | ICD-10-CM | POA: Diagnosis present

## 2021-12-05 DIAGNOSIS — Z79899 Other long term (current) drug therapy: Secondary | ICD-10-CM | POA: Diagnosis not present

## 2021-12-05 DIAGNOSIS — G629 Polyneuropathy, unspecified: Secondary | ICD-10-CM | POA: Insufficient documentation

## 2021-12-05 DIAGNOSIS — C482 Malignant neoplasm of peritoneum, unspecified: Secondary | ICD-10-CM

## 2021-12-05 DIAGNOSIS — Z9221 Personal history of antineoplastic chemotherapy: Secondary | ICD-10-CM | POA: Diagnosis not present

## 2021-12-05 DIAGNOSIS — M79669 Pain in unspecified lower leg: Secondary | ICD-10-CM | POA: Diagnosis not present

## 2021-12-05 DIAGNOSIS — C786 Secondary malignant neoplasm of retroperitoneum and peritoneum: Secondary | ICD-10-CM | POA: Diagnosis present

## 2021-12-05 DIAGNOSIS — E042 Nontoxic multinodular goiter: Secondary | ICD-10-CM | POA: Diagnosis not present

## 2021-12-05 DIAGNOSIS — D490 Neoplasm of unspecified behavior of digestive system: Secondary | ICD-10-CM

## 2021-12-05 DIAGNOSIS — R21 Rash and other nonspecific skin eruption: Secondary | ICD-10-CM | POA: Diagnosis not present

## 2021-12-05 DIAGNOSIS — R600 Localized edema: Secondary | ICD-10-CM | POA: Diagnosis not present

## 2021-12-05 DIAGNOSIS — L819 Disorder of pigmentation, unspecified: Secondary | ICD-10-CM | POA: Insufficient documentation

## 2021-12-05 DIAGNOSIS — Z95828 Presence of other vascular implants and grafts: Secondary | ICD-10-CM

## 2021-12-05 DIAGNOSIS — D649 Anemia, unspecified: Secondary | ICD-10-CM | POA: Insufficient documentation

## 2021-12-05 LAB — CBC WITH DIFFERENTIAL (CANCER CENTER ONLY)
Abs Immature Granulocytes: 0.01 10*3/uL (ref 0.00–0.07)
Basophils Absolute: 0 10*3/uL (ref 0.0–0.1)
Basophils Relative: 1 %
Eosinophils Absolute: 0.1 10*3/uL (ref 0.0–0.5)
Eosinophils Relative: 2 %
HCT: 28.3 % — ABNORMAL LOW (ref 36.0–46.0)
Hemoglobin: 9.8 g/dL — ABNORMAL LOW (ref 12.0–15.0)
Immature Granulocytes: 0 %
Lymphocytes Relative: 45 %
Lymphs Abs: 1.9 10*3/uL (ref 0.7–4.0)
MCH: 37.1 pg — ABNORMAL HIGH (ref 26.0–34.0)
MCHC: 34.6 g/dL (ref 30.0–36.0)
MCV: 107.2 fL — ABNORMAL HIGH (ref 80.0–100.0)
Monocytes Absolute: 0.4 10*3/uL (ref 0.1–1.0)
Monocytes Relative: 9 %
Neutro Abs: 1.8 10*3/uL (ref 1.7–7.7)
Neutrophils Relative %: 43 %
Platelet Count: 154 10*3/uL (ref 150–400)
RBC: 2.64 MIL/uL — ABNORMAL LOW (ref 3.87–5.11)
RDW: 14.6 % (ref 11.5–15.5)
WBC Count: 4.1 10*3/uL (ref 4.0–10.5)
nRBC: 0 % (ref 0.0–0.2)

## 2021-12-05 LAB — CMP (CANCER CENTER ONLY)
ALT: 13 U/L (ref 0–44)
AST: 22 U/L (ref 15–41)
Albumin: 4.3 g/dL (ref 3.5–5.0)
Alkaline Phosphatase: 99 U/L (ref 38–126)
Anion gap: 9 (ref 5–15)
BUN: 30 mg/dL — ABNORMAL HIGH (ref 8–23)
CO2: 22 mmol/L (ref 22–32)
Calcium: 9.2 mg/dL (ref 8.9–10.3)
Chloride: 106 mmol/L (ref 98–111)
Creatinine: 1.13 mg/dL — ABNORMAL HIGH (ref 0.44–1.00)
GFR, Estimated: 53 mL/min — ABNORMAL LOW (ref >60)
Glucose, Bld: 99 mg/dL (ref 70–99)
Potassium: 3.8 mmol/L (ref 3.5–5.1)
Sodium: 137 mmol/L (ref 135–145)
Total Bilirubin: 0.4 mg/dL (ref 0.3–1.2)
Total Protein: 8.3 g/dL — ABNORMAL HIGH (ref 6.5–8.1)

## 2021-12-05 MED ORDER — POTASSIUM CHLORIDE CRYS ER 20 MEQ PO TBCR: EXTENDED_RELEASE_TABLET | ORAL | 0 refills | Status: DC

## 2021-12-05 MED ORDER — FUROSEMIDE 20 MG PO TABS: ORAL_TABLET | ORAL | 0 refills | Status: DC

## 2021-12-05 MED ORDER — HEPARIN SOD (PORK) LOCK FLUSH 100 UNIT/ML IV SOLN
500.0000 [IU] | Freq: Once | INTRAVENOUS | Status: AC
  Administered 2021-12-05: 500 [IU]

## 2021-12-05 MED ORDER — CAPECITABINE 500 MG PO TABS
1000.0000 mg/m2 | ORAL_TABLET | Freq: Two times a day (BID) | ORAL | 1 refills | Status: DC
  Filled 2021-12-20: qty 112, 14d supply, fill #0

## 2021-12-05 MED ORDER — SODIUM CHLORIDE 0.9% FLUSH
10.0000 mL | Freq: Once | INTRAVENOUS | Status: AC
  Administered 2021-12-05: 10 mL

## 2021-12-06 LAB — CA 125: Cancer Antigen (CA) 125: 5.7 U/mL (ref 0.0–38.1)

## 2021-12-20 ENCOUNTER — Other Ambulatory Visit (HOSPITAL_COMMUNITY): Payer: Self-pay

## 2021-12-26 ENCOUNTER — Other Ambulatory Visit (HOSPITAL_COMMUNITY): Payer: Self-pay

## 2021-12-27 ENCOUNTER — Inpatient Hospital Stay: Payer: Medicare Other

## 2021-12-27 ENCOUNTER — Inpatient Hospital Stay: Payer: Medicare Other | Attending: Hematology | Admitting: Hematology

## 2021-12-27 ENCOUNTER — Encounter: Payer: Self-pay | Admitting: Hematology

## 2021-12-27 ENCOUNTER — Other Ambulatory Visit: Payer: Self-pay

## 2021-12-27 VITALS — BP 123/66 | HR 80 | Temp 98.4°F | Resp 17 | Ht 67.0 in | Wt 186.3 lb

## 2021-12-27 DIAGNOSIS — M25473 Effusion, unspecified ankle: Secondary | ICD-10-CM | POA: Diagnosis not present

## 2021-12-27 DIAGNOSIS — D649 Anemia, unspecified: Secondary | ICD-10-CM | POA: Diagnosis not present

## 2021-12-27 DIAGNOSIS — Z9221 Personal history of antineoplastic chemotherapy: Secondary | ICD-10-CM | POA: Insufficient documentation

## 2021-12-27 DIAGNOSIS — Z7901 Long term (current) use of anticoagulants: Secondary | ICD-10-CM | POA: Insufficient documentation

## 2021-12-27 DIAGNOSIS — Z79899 Other long term (current) drug therapy: Secondary | ICD-10-CM | POA: Insufficient documentation

## 2021-12-27 DIAGNOSIS — C786 Secondary malignant neoplasm of retroperitoneum and peritoneum: Secondary | ICD-10-CM

## 2021-12-27 DIAGNOSIS — E042 Nontoxic multinodular goiter: Secondary | ICD-10-CM | POA: Diagnosis not present

## 2021-12-27 DIAGNOSIS — R188 Other ascites: Secondary | ICD-10-CM | POA: Diagnosis not present

## 2021-12-27 DIAGNOSIS — L819 Disorder of pigmentation, unspecified: Secondary | ICD-10-CM | POA: Insufficient documentation

## 2021-12-27 DIAGNOSIS — Z95828 Presence of other vascular implants and grafts: Secondary | ICD-10-CM

## 2021-12-27 DIAGNOSIS — R600 Localized edema: Secondary | ICD-10-CM | POA: Diagnosis not present

## 2021-12-27 DIAGNOSIS — G620 Drug-induced polyneuropathy: Secondary | ICD-10-CM | POA: Diagnosis not present

## 2021-12-27 DIAGNOSIS — I700 Atherosclerosis of aorta: Secondary | ICD-10-CM | POA: Diagnosis not present

## 2021-12-27 DIAGNOSIS — J90 Pleural effusion, not elsewhere classified: Secondary | ICD-10-CM | POA: Diagnosis not present

## 2021-12-27 DIAGNOSIS — C482 Malignant neoplasm of peritoneum, unspecified: Secondary | ICD-10-CM

## 2021-12-27 DIAGNOSIS — C801 Malignant (primary) neoplasm, unspecified: Secondary | ICD-10-CM | POA: Diagnosis present

## 2021-12-27 DIAGNOSIS — Z86711 Personal history of pulmonary embolism: Secondary | ICD-10-CM | POA: Insufficient documentation

## 2021-12-27 DIAGNOSIS — T451X5A Adverse effect of antineoplastic and immunosuppressive drugs, initial encounter: Secondary | ICD-10-CM | POA: Diagnosis not present

## 2021-12-27 DIAGNOSIS — D490 Neoplasm of unspecified behavior of digestive system: Secondary | ICD-10-CM

## 2021-12-27 LAB — CBC WITH DIFFERENTIAL (CANCER CENTER ONLY)
Abs Immature Granulocytes: 0.02 10*3/uL (ref 0.00–0.07)
Basophils Absolute: 0 10*3/uL (ref 0.0–0.1)
Basophils Relative: 0 %
Eosinophils Absolute: 0.1 10*3/uL (ref 0.0–0.5)
Eosinophils Relative: 2 %
HCT: 29.2 % — ABNORMAL LOW (ref 36.0–46.0)
Hemoglobin: 9.9 g/dL — ABNORMAL LOW (ref 12.0–15.0)
Immature Granulocytes: 0 %
Lymphocytes Relative: 34 %
Lymphs Abs: 1.9 10*3/uL (ref 0.7–4.0)
MCH: 36.3 pg — ABNORMAL HIGH (ref 26.0–34.0)
MCHC: 33.9 g/dL (ref 30.0–36.0)
MCV: 107 fL — ABNORMAL HIGH (ref 80.0–100.0)
Monocytes Absolute: 0.5 10*3/uL (ref 0.1–1.0)
Monocytes Relative: 9 %
Neutro Abs: 3.1 10*3/uL (ref 1.7–7.7)
Neutrophils Relative %: 55 %
Platelet Count: 179 10*3/uL (ref 150–400)
RBC: 2.73 MIL/uL — ABNORMAL LOW (ref 3.87–5.11)
RDW: 14.6 % (ref 11.5–15.5)
WBC Count: 5.7 10*3/uL (ref 4.0–10.5)
nRBC: 0 % (ref 0.0–0.2)

## 2021-12-27 LAB — CMP (CANCER CENTER ONLY)
ALT: 8 U/L (ref 0–44)
AST: 15 U/L (ref 15–41)
Albumin: 4.1 g/dL (ref 3.5–5.0)
Alkaline Phosphatase: 92 U/L (ref 38–126)
Anion gap: 8 (ref 5–15)
BUN: 31 mg/dL — ABNORMAL HIGH (ref 8–23)
CO2: 27 mmol/L (ref 22–32)
Calcium: 9.4 mg/dL (ref 8.9–10.3)
Chloride: 103 mmol/L (ref 98–111)
Creatinine: 1.31 mg/dL — ABNORMAL HIGH (ref 0.44–1.00)
GFR, Estimated: 44 mL/min — ABNORMAL LOW (ref >60)
Glucose, Bld: 118 mg/dL — ABNORMAL HIGH (ref 70–99)
Potassium: 4 mmol/L (ref 3.5–5.1)
Sodium: 138 mmol/L (ref 135–145)
Total Bilirubin: 0.5 mg/dL (ref 0.3–1.2)
Total Protein: 8.1 g/dL (ref 6.5–8.1)

## 2021-12-27 MED ORDER — SODIUM CHLORIDE 0.9% FLUSH
10.0000 mL | Freq: Once | INTRAVENOUS | Status: AC
  Administered 2021-12-27: 10 mL

## 2021-12-27 MED ORDER — HEPARIN SOD (PORK) LOCK FLUSH 100 UNIT/ML IV SOLN
500.0000 [IU] | Freq: Once | INTRAVENOUS | Status: AC
  Administered 2021-12-27: 500 [IU]

## 2021-12-27 NOTE — Patient Instructions (Signed)

## 2021-12-27 NOTE — Progress Notes (Signed)
?[place]   ?[phone] [phone]   ?Clinic Follow up Note  ? ?Patient Care Team: ?Pcp, No as PCP - General ?[name], MD as Consulting Physician (Hematology) ? ?Date of Service:  12/27/2021 ? ?CHIEF COMPLAINT: f/u of peritoneal carcinomatosis ? ?CURRENT THERAPY:  ?Xeloda 2000 mg BID days 1-14 q. 21 days, starting 10/29/21 ? ?ASSESSMENT & PLAN:  ?Brenda Stewart is a 71 y.o. female with  ? ?1. 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 CT AP 04/21/21 showed findings suspicious for diffuse peritoneal carcinomatosis. She was admitted and underwent paracentesis on 04/24/21 showing atypical cells. ?-Diagnostic laparoscopy on 04/30/21 with pathology from peritoneum biopsy showing metastatic carcinoma with signet ring cell features. IHC supports upper GI primary.  ?-Her EGD was negative for primary malignancy. ?-her cancer is PD-L1 negative and MMR normal ?-CT CAP from 08/10/21 showed decreased peritoneal thickening/stranding, resolution of ascites, and reduced size of inferior right liver implant.  No new lesion or evidence of progression.  She has responded well to treatment.  ?-she completed 12 cycles of FOLFOX 05/04/21-10/10/21, tolerated very well overall but stopped due to neuropathy.  ?-She started maintenance Xeloda on 10/29/21, she has tolerated very well with no noticeable side effects.  ?-she was supposed to have a restaging CT prior to today's visit, but this was never scheduled. We will work to have this done soon. I gave her the number to call and schedule  ?-labs reviewed, overall stable except for rising creatinine (likely from Lasix). We will monitor. Otherwise adequate to continue Xeloda. ?  ?2. Side effects: Leg edema, hyperpigmentation ?-likely secondary to Xeloda ?-we prescribed lasix + KCL on  12/05/21 for her to take as needed, no more than 3 times a week. She will also elevate legs, use compression stockings, etc.  ?-her creatinine is rising from the lasix, up to 1.31 today (12/27/21). We will monitor. Will encourage her to drink fluids adequately and use lasix less frequent  ?  ?3. Mild Anemia ?-found to have anemia while in the hospital ?-stable, hgb 9.9 today (12/27/21) ?  ?4. 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 She still reports residual tingling in her fingertips and toes, her handwriting has changed ?-I recommend to begin B complex vitamin. She does not want to take gabapentin again ? ?5. H/o PE 04/2021 ?-incidental finding on staging chest CT on 05/04/21 ?-she was given heparin in the hospital and transitioned to Eliquis. ?-she reports a high copay for Eliquis. We will either look for a replacement or for financial assistance. ?  ?  ?PLAN: ?-continue Xeloda, 2000 mg BID for 2 weeks, then 1 week off ?-continue lasix as needed, up to 3 times a week, she will use compression stocks and elevate her legs for ankle edema ?-will look into financial assistance for Eliquis or Xarelto  ?-restaging CT CAP prior to next visit ?-lab, flush, and f/u in 3 weeks ? ? ?No problem-specific Assessment & Plan notes found for this encounter. ? ? 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 CT AP w/o contrast ? ?IMPRESSION: ?Findings highly suspicious for diffuse peritoneal carcinomatosis. ?  ?Suspected distal small bowel obstruction, with probable transition ?point in right lower quadrant. ?  ?Aortic Atherosclerosis (ICD10-I70.0). ?  ?  04/22/2021 Imaging  ? US Abdomen ? ?IMPRESSION: ?Small volume of lower abdominopelvic ascites. ?  ?04/24/2021 Pathology Results  ? Cytology ? ?Specimen Submitted:  A. ASCITES, PARACENTESIS:  ? ?FINAL MICROSCOPIC DIAGNOSIS:  ?- Atypical cells  present ? ?DIAGNOSTIC COMMENTS:  ?Immunohistochemical stains show that the rare, mildly atypical cells are positive for calretinin and D2-40; while they are negative for PAX8, ER and MOC-31.  This immunoprofile is consistent with reactive mesothelial cells.  ?  ?04/25/2021 Initial Diagnosis  ? Primary peritoneal carcinomatosis [facility]) ?  ?04/29/2021 Imaging  ? CT AP ? ?IMPRESSION: ?1. Moderate ascites, mild peritoneal thickening and diffuse omental ?thickening/caking and smaller focal fluid/cystic areas within the ?RIGHT abdomen and LOWER pelvis, compatible with peritoneal ?carcinomatosis. Mild mesenteric stranding is nonspecific. ?2. Small subserosal implants along the INFERIOR RIGHT liver. ?3. Small LEFT pleural effusion and trace RIGHT pleural effusion with LEFT LOWER lung atelectasis. ?4. Aortic Atherosclerosis (ICD10-I70.0). ?  ?04/30/2021 Pathology Results  ? FINAL MICROSCOPIC DIAGNOSIS:  ? ?A. PERITONEUM, BIOPSY:  ?-  Metastatic carcinoma with signet ring cell features  ?-  See comment  ? ?B. PERTIONEUM, ANTERIOR, EXCISION:  ?-  Metastatic carcinoma with signet ring cell features  ?-  See comment  ? ?COMMENT:  ? ?By immunohistochemistry (performed on blocks A2 and B1), the neoplastic cells are positive for cytokeratin AE1/3, cytokeratin 20 and CDX2 but negative for TTF-1, ER, GATA3, PAX 8 and cytokeratin 7.  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  ? ?ADDENDUM:  ?By immunohistochemistry, HER-2 is EQUIVOCAL (2+).   ? ?FLOURESCENCE IN-SITU HYBRIDIZATION RESULTS:  ?GROUP 5:   HER2 **NEGATIVE**  ?  ?05/04/2021 -  Chemotherapy  ? Patient is on Treatment Plan : GASTRIC FOLFOX q14d x 12 cycles  ?   ?05/04/2021 Imaging  ? CT Chest ? ?IMPRESSION: ?1. Suspected segmental filling defects in the anterior segment right ?upper lobe and apical segment right upper lobe compatible with small pulmonary emboli. Clot burden appears small, although please note that  today's examination was not a dedicated CT angiogram and as such might overestimate or underestimate pulmonary embolus. Positive for acute PE with CT evidence of right heart strain (RV/LV Ratio = 1.1) consistent with at least submassive (intermediate risk) PE. The presence of right heart strain has been associated with an increased risk of morbidity and mortality. Please refer to the "PE Focused" order set in EPIC. ?2. No compelling findings of metastatic disease to the chest. There ?is a small left pleural effusion, but no definite enhancement along ?the pleura to suggest malignant or exudative etiology at this time. ?3. There is some new gas in the soft tissues of the left ?thoracoabdominal wall, compatible subcutaneous emphysema, probably related to recent laparoscopy or other procedure. ?4. Hypodense thyroid nodules up to 2.1 cm in diameter. Recommend thyroid US (ref: J Am Coll Radiol. 2015 Feb;12(2): 143-50). ?5. Left upper quadrant ascites, including fluid in the lesser sac. ?This is reduced compared to the prior CT abdomen of 04/29/2021. ?6. Stable appearance of periportal edema and hypodensities along the posterior margin of the right hepatic lobe. ?  ?05/07/2021 Pathology Results  ? FINAL MICROSCOPIC DIAGNOSIS:  ? ?A. DUODENUM, BULB, BIOPSY:  ?- Polypoid duodenal mucosa with chronic inflammation, foveolar  ?metaplasia and reactive changes  ?- Negative for dysplasia or malignancy  ? ?Cytology: ?Specimen Submitted:  A. ESOPHAGUS, BRUSHING:  ? ?FINAL MICROSCOPIC DIAGNOSIS:  ?- Benign reactive/reparative changes  ?  ?05/16/2021 Cancer Staging  ?  Staging form: Soft Tissue Sarcoma of the Abdomen and Thoracic Visceral Organs, AJCC 8th Edition ?- Clinical: cTX, cN0, pM1 - Signed by [name], MD on 05/16/2021 ? ?  ?08/10/2021 Imaging  ? CT CAP IMPRESSION: ?1. Subserosal implants along the inferior right liver are no longer ?apparent or decreased in size when compared to prior exam. ?2. Interval resolution of abdominal  ascites. Decreased peritoneal ?thickening and stranding. ?3. Interval resolution of small left pleural effusion. ?4.  Aortic Atherosclerosis (ICD10-I70.0). ?  ?  ? ? ? ?INTERVAL HISTORY:  ?Brenda Stewart is here for a follow

## 2021-12-28 ENCOUNTER — Other Ambulatory Visit: Payer: Self-pay

## 2021-12-28 LAB — CA 125: Cancer Antigen (CA) 125: 5.7 U/mL (ref 0.0–38.1)

## 2021-12-28 MED ORDER — APIXABAN 5 MG PO TABS: 5.0000 mg | ORAL_TABLET | Freq: Two times a day (BID) | ORAL | 2 refills | Status: DC

## 2022-01-01 ENCOUNTER — Other Ambulatory Visit: Payer: Self-pay | Admitting: Nurse Practitioner

## 2022-01-01 ENCOUNTER — Other Ambulatory Visit: Payer: Self-pay

## 2022-01-01 ENCOUNTER — Telehealth: Payer: Self-pay | Admitting: Hematology

## 2022-01-01 NOTE — Telephone Encounter (Signed)
.  Called patient to schedule appointment per 4/11 inbasket, patient is aware of date and time.   ?

## 2022-01-04 NOTE — Progress Notes (Signed)
Patient called.  Unable to reach patient.

## 2022-01-07 ENCOUNTER — Telehealth: Payer: Self-pay

## 2022-01-07 NOTE — Telephone Encounter (Signed)
Left pt voicemail regarding the medication assistance form she needs to fill out and to give the office a call back at [phone] to discuss further details.  ?

## 2022-01-08 ENCOUNTER — Ambulatory Visit (HOSPITAL_COMMUNITY)
Admission: RE | Admit: 2022-01-08 | Discharge: 2022-01-08 | Disposition: A | Discharge status: Home / Self Care | Payer: Medicare Other | Source: Ambulatory Visit | Attending: Nurse Practitioner | Admitting: Nurse Practitioner

## 2022-01-08 ENCOUNTER — Other Ambulatory Visit (HOSPITAL_COMMUNITY): Payer: Self-pay

## 2022-01-08 DIAGNOSIS — C786 Secondary malignant neoplasm of retroperitoneum and peritoneum: Secondary | ICD-10-CM | POA: Diagnosis present

## 2022-01-08 LAB — POCT I-STAT CREATININE: Creatinine, Ser: 1.3 mg/dL — ABNORMAL HIGH (ref 0.44–1.00)

## 2022-01-08 MED ORDER — IOHEXOL 300 MG/ML  SOLN
100.0000 mL | Freq: Once | INTRAMUSCULAR | Status: AC | PRN
  Administered 2022-01-08: 80 mL via INTRAVENOUS

## 2022-01-08 MED ORDER — SODIUM CHLORIDE (PF) 0.9 % IJ SOLN
INTRAMUSCULAR | Status: AC
  Filled 2022-01-08: qty 50

## 2022-01-11 ENCOUNTER — Telehealth: Payer: Self-pay | Admitting: Hematology

## 2022-01-11 NOTE — Telephone Encounter (Signed)
.  Called pt per 3/15 los , Patient was unavailable, a message with appt time and date was left with [facility] number on file.   ?

## 2022-01-14 ENCOUNTER — Other Ambulatory Visit (HOSPITAL_COMMUNITY): Payer: Self-pay

## 2022-01-16 ENCOUNTER — Inpatient Hospital Stay: Payer: Medicare Other

## 2022-01-16 ENCOUNTER — Telehealth: Payer: Self-pay

## 2022-01-16 ENCOUNTER — Telehealth: Payer: Self-pay | Admitting: Nurse Practitioner

## 2022-01-16 ENCOUNTER — Inpatient Hospital Stay: Payer: Medicare Other | Admitting: Nurse Practitioner

## 2022-01-16 NOTE — Telephone Encounter (Signed)
This nurse spoke with patients daughter who stated that she had a missed call from our office.  This nurse explained that patient had an appointment scheduled today and she cancelled and we  were checking on the patient to make sure that she was ok.  Daughter stated that the patient is doing fine she was with the patient and stated that she has been sleeping all day because she has not be sleeping well at night lately.  Stated that patient is usually woke until 5 am and then she sleeps all morning.  Patient cancelled appointment due to being very tired and had turned her ringer off.  This nurse advised that this information would be sent to the provider and that our schedulers will reach out to them to get patient rescheduled.  No further questions or concerns at this time.   ?

## 2022-01-16 NOTE — Telephone Encounter (Signed)
I received a notice from scheduler that pt cancelled her appointment today. I called to speak to [name] and review CT scan, no answer and mailbox is full so I could not leave a message. No answer when I called her daughter. We will continue attempts to reach patient for f/up. ? ?Brenda Rue, NP  ?

## 2022-01-18 ENCOUNTER — Ambulatory Visit: Payer: TRICARE For Life (TFL) | Admitting: Hematology

## 2022-01-18 ENCOUNTER — Other Ambulatory Visit: Payer: TRICARE For Life (TFL)

## 2022-01-23 ENCOUNTER — Inpatient Hospital Stay: Payer: Medicare Other

## 2022-01-23 ENCOUNTER — Other Ambulatory Visit (HOSPITAL_COMMUNITY): Payer: Self-pay

## 2022-01-23 ENCOUNTER — Encounter: Payer: Self-pay | Admitting: Hematology

## 2022-01-23 ENCOUNTER — Other Ambulatory Visit: Payer: Self-pay

## 2022-01-23 ENCOUNTER — Telehealth: Payer: Self-pay

## 2022-01-23 ENCOUNTER — Inpatient Hospital Stay: Payer: Medicare Other | Attending: Hematology | Admitting: Hematology

## 2022-01-23 ENCOUNTER — Other Ambulatory Visit: Payer: TRICARE For Life (TFL)

## 2022-01-23 DIAGNOSIS — C786 Secondary malignant neoplasm of retroperitoneum and peritoneum: Secondary | ICD-10-CM | POA: Diagnosis present

## 2022-01-23 DIAGNOSIS — C269 Malignant neoplasm of ill-defined sites within the digestive system: Secondary | ICD-10-CM | POA: Insufficient documentation

## 2022-01-23 DIAGNOSIS — D649 Anemia, unspecified: Secondary | ICD-10-CM | POA: Diagnosis not present

## 2022-01-23 DIAGNOSIS — E042 Nontoxic multinodular goiter: Secondary | ICD-10-CM | POA: Insufficient documentation

## 2022-01-23 DIAGNOSIS — Z86711 Personal history of pulmonary embolism: Secondary | ICD-10-CM | POA: Insufficient documentation

## 2022-01-23 DIAGNOSIS — R600 Localized edema: Secondary | ICD-10-CM | POA: Diagnosis not present

## 2022-01-23 DIAGNOSIS — J90 Pleural effusion, not elsewhere classified: Secondary | ICD-10-CM | POA: Insufficient documentation

## 2022-01-23 DIAGNOSIS — Z79899 Other long term (current) drug therapy: Secondary | ICD-10-CM | POA: Diagnosis not present

## 2022-01-23 DIAGNOSIS — D490 Neoplasm of unspecified behavior of digestive system: Secondary | ICD-10-CM

## 2022-01-23 DIAGNOSIS — I700 Atherosclerosis of aorta: Secondary | ICD-10-CM | POA: Diagnosis not present

## 2022-01-23 DIAGNOSIS — R188 Other ascites: Secondary | ICD-10-CM | POA: Diagnosis not present

## 2022-01-23 DIAGNOSIS — C482 Malignant neoplasm of peritoneum, unspecified: Secondary | ICD-10-CM

## 2022-01-23 DIAGNOSIS — L819 Disorder of pigmentation, unspecified: Secondary | ICD-10-CM | POA: Diagnosis not present

## 2022-01-23 DIAGNOSIS — Z7901 Long term (current) use of anticoagulants: Secondary | ICD-10-CM | POA: Insufficient documentation

## 2022-01-23 DIAGNOSIS — G629 Polyneuropathy, unspecified: Secondary | ICD-10-CM | POA: Diagnosis not present

## 2022-01-23 DIAGNOSIS — Z95828 Presence of other vascular implants and grafts: Secondary | ICD-10-CM

## 2022-01-23 LAB — CBC WITH DIFFERENTIAL (CANCER CENTER ONLY)
Abs Immature Granulocytes: 0.03 10*3/uL (ref 0.00–0.07)
Basophils Absolute: 0.1 10*3/uL (ref 0.0–0.1)
Basophils Relative: 1 %
Eosinophils Absolute: 0.2 10*3/uL (ref 0.0–0.5)
Eosinophils Relative: 4 %
HCT: 28.8 % — ABNORMAL LOW (ref 36.0–46.0)
Hemoglobin: 9.9 g/dL — ABNORMAL LOW (ref 12.0–15.0)
Immature Granulocytes: 1 %
Lymphocytes Relative: 38 %
Lymphs Abs: 1.9 10*3/uL (ref 0.7–4.0)
MCH: 37.1 pg — ABNORMAL HIGH (ref 26.0–34.0)
MCHC: 34.4 g/dL (ref 30.0–36.0)
MCV: 107.9 fL — ABNORMAL HIGH (ref 80.0–100.0)
Monocytes Absolute: 0.5 10*3/uL (ref 0.1–1.0)
Monocytes Relative: 11 %
Neutro Abs: 2.4 10*3/uL (ref 1.7–7.7)
Neutrophils Relative %: 45 %
Platelet Count: 149 10*3/uL — ABNORMAL LOW (ref 150–400)
RBC: 2.67 MIL/uL — ABNORMAL LOW (ref 3.87–5.11)
RDW: 15 % (ref 11.5–15.5)
WBC Count: 5.1 10*3/uL (ref 4.0–10.5)
nRBC: 0 % (ref 0.0–0.2)

## 2022-01-23 LAB — CMP (CANCER CENTER ONLY)
ALT: 12 U/L (ref 0–44)
AST: 24 U/L (ref 15–41)
Albumin: 4 g/dL (ref 3.5–5.0)
Alkaline Phosphatase: 111 U/L (ref 38–126)
Anion gap: 9 (ref 5–15)
BUN: 26 mg/dL — ABNORMAL HIGH (ref 8–23)
CO2: 24 mmol/L (ref 22–32)
Calcium: 9.5 mg/dL (ref 8.9–10.3)
Chloride: 104 mmol/L (ref 98–111)
Creatinine: 1.01 mg/dL — ABNORMAL HIGH (ref 0.44–1.00)
GFR, Estimated: >60 mL/min (ref >60)
Glucose, Bld: 111 mg/dL — ABNORMAL HIGH (ref 70–99)
Potassium: 3.5 mmol/L (ref 3.5–5.1)
Sodium: 137 mmol/L (ref 135–145)
Total Bilirubin: 1 mg/dL (ref 0.3–1.2)
Total Protein: 8.1 g/dL (ref 6.5–8.1)

## 2022-01-23 MED ORDER — HEPARIN SOD (PORK) LOCK FLUSH 100 UNIT/ML IV SOLN
500.0000 [IU] | Freq: Once | INTRAVENOUS | Status: AC
  Administered 2022-01-23: 500 [IU]

## 2022-01-23 MED ORDER — CAPECITABINE 500 MG PO TABS
ORAL_TABLET | ORAL | 1 refills | Status: DC
  Filled 2022-01-23: qty 112, 21d supply, fill #0
  Filled 2022-01-31: qty 112, 21d supply, fill #1

## 2022-01-23 MED ORDER — SODIUM CHLORIDE 0.9% FLUSH
10.0000 mL | Freq: Once | INTRAVENOUS | Status: AC
  Administered 2022-01-23: 10 mL

## 2022-01-23 NOTE — Progress Notes (Signed)
?[place]   ?Telephone:([phone] [phone]   ?Clinic Follow up Note  ? ?Patient Care Team: ?Pcp, No as PCP - General ?[name], MD as Consulting Physician (Hematology) ? ?Date of Service:  01/23/2022 ? ?CHIEF COMPLAINT: f/u of peritoneal carcinomatosis ? ?CURRENT THERAPY:  ?Xeloda 2000 mg BID days 1-14 q. 21 days, starting 10/29/21 ? ?ASSESSMENT & PLAN:  ?Brenda Stewart is a 71 y.o. female with  ? ?1. 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 CT AP 04/21/21 showed findings suspicious for diffuse peritoneal carcinomatosis. She was admitted and underwent paracentesis on 04/24/21 showing atypical cells. ?-Diagnostic laparoscopy on 04/30/21 with pathology from peritoneum biopsy showing metastatic carcinoma with signet ring cell features. IHC supports upper GI primary.  ?-Her EGD was negative for primary malignancy. ?-her cancer is PD-L1 negative and MMR normal  ?-she completed 12 cycles of FOLFOX 05/04/21-10/10/21, tolerated very well overall but stopped due to neuropathy. She has responded well to treatment.  ?-She started maintenance Xeloda on 10/29/21, she has tolerated very well with no noticeable side effects.  ?-restaging CT CAP on 01/08/22 showed NED. I reviewed the images and discussed the results with her today.  She has had excellent response to chemotherapy.  We discussed that her cancer is not cured, but not visible on CT scan anymore. ?-she continues to tolerate Xeloda well. Her scan looks good, so we will continue. Labs reviewed, overall stable, adequate to continue Xeloda. ?  ?2. Side effects: Leg edema, hyperpigmentation ?-likely secondary to Xeloda ?-we prescribed lasix + KCL on 12/05/21 for her to take as needed, no more than 3 times a week. She will also elevate legs, use compression stockings, etc.  ?  ?3.  Mild Anemia ?-found to have anemia while in the hospital ?-stable, hgb 9.9 today (01/23/22) ?  ?4. 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 She still reports residual tingling in her fingertips and toes, her handwriting has changed ?-I recommend to begin B complex vitamin. She does not want to take gabapentin again ?  ?5. H/o PE 04/2021 ?-incidental finding on staging chest CT on 05/04/21 ?-she was given heparin in the hospital and transitioned to Eliquis. ? ?  ?  ?PLAN: ?-continue Xeloda, 2000 mg BID for 2 weeks, then 1 week off, she started this cycle on 5/1  ?-lab, flush, and f/u with NP Brenda Stewart in 5 weeks, f/u with me in 8 weeks  ? ? ?No problem-specific Assessment & Plan notes found for this encounter. ? ? 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 CT AP w/o contrast ? ?IMPRESSION: ?Findings highly suspicious for diffuse peritoneal carcinomatosis. ?  ?Suspected distal small bowel obstruction, with probable transition ?point in right lower quadrant. ?  ?Aortic Atherosclerosis (ICD10-I70.0). ?  ?04/22/2021 Imaging  ? US Abdomen ? ?IMPRESSION: ?Small volume of lower abdominopelvic ascites. ?  ?04/24/2021 Pathology Results  ? Cytology ? ?Specimen Submitted:  A. ASCITES, PARACENTESIS:  ? ?FINAL MICROSCOPIC DIAGNOSIS:  ?- Atypical cells present ? ?DIAGNOSTIC COMMENTS:  ?Immunohistochemical stains show that the rare, mildly atypical cells are positive for calretinin and D2-40; while they are negative for PAX8, ER and MOC-31.  This immunoprofile is consistent with reactive mesothelial cells.  ?  ?04/25/2021 Initial Diagnosis  ?  Primary peritoneal carcinomatosis ([place]) ? ?  ?04/29/2021 Imaging  ? CT AP ? ?IMPRESSION: ?1. Moderate ascites, mild peritoneal thickening and diffuse omental ?thickening/caking and smaller focal fluid/cystic areas within the ?RIGHT abdomen and LOWER pelvis, compatible with  peritoneal ?carcinomatosis. Mild mesenteric stranding is nonspecific. ?2. Small subserosal implants along the INFERIOR RIGHT liver. ?3. Small LEFT pleural effusion and trace RIGHT pleural effusion with LEFT LOWER lung atelectasis. ?4. Aortic Atherosclerosis (ICD10-I70.0). ?  ?04/30/2021 Pathology Results  ? FINAL MICROSCOPIC DIAGNOSIS:  ? ?A. PERITONEUM, BIOPSY:  ?-  Metastatic carcinoma with signet ring cell features  ?-  See comment  ? ?B. PERTIONEUM, ANTERIOR, EXCISION:  ?-  Metastatic carcinoma with signet ring cell features  ?-  See comment  ? ?COMMENT:  ? ?By immunohistochemistry (performed on blocks A2 and B1), the neoplastic cells are positive for cytokeratin AE1/3, cytokeratin 20 and CDX2 but negative for TTF-1, ER, GATA3, PAX 8 and cytokeratin 7.  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  ? ?ADDENDUM:  ?By immunohistochemistry, HER-2 is EQUIVOCAL (2+).   ? ?FLOURESCENCE IN-SITU HYBRIDIZATION RESULTS:  ?GROUP 5:   HER2 **NEGATIVE**  ?  ?05/04/2021 -  Chemotherapy  ? Patient is on Treatment Plan : GASTRIC FOLFOX q14d x 12 cycles  ? ?  ?  ?05/04/2021 Imaging  ? CT Chest ? ?IMPRESSION: ?1. Suspected segmental filling defects in the anterior segment right ?upper lobe and apical segment right upper lobe compatible with small pulmonary emboli. Clot burden appears small, although please note that today's examination was not a dedicated CT angiogram and as such might overestimate or underestimate pulmonary embolus. Positive for acute PE with CT evidence of right heart strain (RV/LV Ratio = 1.1) consistent with at least submassive (intermediate risk) PE. The presence of right heart strain has been associated with an increased risk of morbidity and mortality. Please refer to the "PE Focused" order set in EPIC. ?2. No compelling findings of metastatic disease to the chest. There ?is a small left pleural effusion, but no definite enhancement  along ?the pleura to suggest malignant or exudative etiology at this time. ?3. There is some new gas in the soft tissues of the left ?thoracoabdominal wall, compatible subcutaneous emphysema, probably related to recent laparoscopy or other procedure. ?4. Hypodense thyroid nodules up to 2.1 cm in diameter. Recommend thyroid US (ref: J Am Coll Radiol. 2015 Feb;12(2): 143-50). ?5. Left upper quadrant ascites, including fluid in the lesser sac. ?This is reduced compared to the prior CT abdomen of 04/29/2021. ?6. Stable appearance of periportal edema and hypodensities along the posterior margin of the right hepatic lobe. ?  ?05/07/2021 Pathology Results  ? FINAL MICROSCOPIC DIAGNOSIS:  ? ?A. DUODENUM, BULB, BIOPSY:  ?- Polypoid duodenal mucosa with chronic inflammation, foveolar  ?metaplasia and reactive changes  ?- Negative for dysplasia or malignancy  ? ?Cytology: ?Specimen Submitted:  A. ESOPHAGUS, BRUSHING:  ? ?FINAL MICROSCOPIC DIAGNOSIS:  ?- Benign reactive/reparative changes  ?  ?05/16/2021 Cancer Staging  ? Staging form: Soft Tissue Sarcoma of the Abdomen and Thoracic Visceral Organs, AJCC 8th Edition ?- Clinical: cTX, cN0, pM1 - Signed by [name], MD on 05/16/2021 ? ?  ?08/10/2021 Imaging  ? CT CAP IMPRESSION: ?1. Subserosal implants along the inferior right liver are no longer ?apparent or decreased in size when compared to prior exam. ?2. Interval resolution of abdominal ascites. Decreased peritoneal ?thickening and stranding. ?3. Interval resolution of small left pleural effusion. ?4.  Aortic Atherosclerosis (ICD10-I70.0). ?  ?  ? ? ? ?INTERVAL HISTORY:  ?Brenda Stewart is here for a follow up of peritoneal carcinomatosis. She was last seen by me on 12/27/21. She presents to the clinic alone. ?She reports she is doing well overall. She notes some residual neuropathy symptoms. She denies any loss of appetite or bowel issues. She notes she has some extra pills of Xeloda from the days she forgot to take it in the  morning. ?  ?All other systems were reviewed with the patient and are negative. ? ?MEDICAL HISTORY:  ?Past Medical History:  ?Diagnosis Date  ? Acid reflux   ? Hypertension   ? ? ?SURGICAL HISTORY: ?Past Surgical

## 2022-01-23 NOTE — Telephone Encounter (Signed)
Faxed application to [place] [phone]) for assistance with Eliquis (Apixaban)  Fax confirmation received.  Copy of paperwork sent to be scanned in pt's chart and a copy was mailed to patient.   ?

## 2022-01-24 LAB — CA 125: Cancer Antigen (CA) 125: 5.6 U/mL (ref 0.0–38.1)

## 2022-01-29 ENCOUNTER — Telehealth: Payer: Self-pay | Admitting: Hematology

## 2022-01-29 NOTE — Telephone Encounter (Signed)
Left message with follow-up appointments per 5/3 los. ?

## 2022-01-31 ENCOUNTER — Other Ambulatory Visit (HOSPITAL_COMMUNITY): Payer: Self-pay

## 2022-02-05 ENCOUNTER — Other Ambulatory Visit: Payer: Self-pay | Admitting: Hematology

## 2022-02-10 ENCOUNTER — Other Ambulatory Visit: Payer: Self-pay | Admitting: Hematology

## 2022-02-12 ENCOUNTER — Other Ambulatory Visit (HOSPITAL_COMMUNITY): Payer: Self-pay

## 2022-02-19 ENCOUNTER — Other Ambulatory Visit: Payer: Self-pay

## 2022-02-19 MED ORDER — APIXABAN 5 MG PO TABS: 5.0000 mg | ORAL_TABLET | Freq: Two times a day (BID) | ORAL | 2 refills | Status: DC

## 2022-02-27 ENCOUNTER — Other Ambulatory Visit (HOSPITAL_COMMUNITY): Payer: Self-pay

## 2022-02-27 ENCOUNTER — Other Ambulatory Visit: Payer: Self-pay | Admitting: Hematology

## 2022-02-27 DIAGNOSIS — C786 Secondary malignant neoplasm of retroperitoneum and peritoneum: Secondary | ICD-10-CM

## 2022-02-27 MED ORDER — CAPECITABINE 500 MG PO TABS
ORAL_TABLET | ORAL | 1 refills | Status: DC
  Filled 2022-02-27: qty 112, 21d supply, fill #0
  Filled 2022-03-21: qty 112, 21d supply, fill #1

## 2022-02-28 ENCOUNTER — Other Ambulatory Visit (HOSPITAL_COMMUNITY): Payer: Self-pay

## 2022-02-28 ENCOUNTER — Other Ambulatory Visit: Payer: Self-pay

## 2022-02-28 DIAGNOSIS — C786 Secondary malignant neoplasm of retroperitoneum and peritoneum: Secondary | ICD-10-CM

## 2022-02-28 DIAGNOSIS — C482 Malignant neoplasm of peritoneum, unspecified: Secondary | ICD-10-CM

## 2022-03-01 ENCOUNTER — Inpatient Hospital Stay: Payer: Medicare Other | Attending: Hematology

## 2022-03-01 ENCOUNTER — Other Ambulatory Visit: Payer: Self-pay

## 2022-03-01 ENCOUNTER — Inpatient Hospital Stay (HOSPITAL_BASED_OUTPATIENT_CLINIC_OR_DEPARTMENT_OTHER): Payer: Medicare Other | Admitting: Physician Assistant

## 2022-03-01 VITALS — BP 137/76 | HR 92 | Temp 97.8°F | Resp 18 | Ht 67.0 in | Wt 192.5 lb

## 2022-03-01 DIAGNOSIS — Z7901 Long term (current) use of anticoagulants: Secondary | ICD-10-CM | POA: Insufficient documentation

## 2022-03-01 DIAGNOSIS — C269 Malignant neoplasm of ill-defined sites within the digestive system: Secondary | ICD-10-CM | POA: Insufficient documentation

## 2022-03-01 DIAGNOSIS — Z95828 Presence of other vascular implants and grafts: Secondary | ICD-10-CM

## 2022-03-01 DIAGNOSIS — Z79899 Other long term (current) drug therapy: Secondary | ICD-10-CM | POA: Diagnosis not present

## 2022-03-01 DIAGNOSIS — C482 Malignant neoplasm of peritoneum, unspecified: Secondary | ICD-10-CM

## 2022-03-01 DIAGNOSIS — C786 Secondary malignant neoplasm of retroperitoneum and peritoneum: Secondary | ICD-10-CM | POA: Insufficient documentation

## 2022-03-01 DIAGNOSIS — Z86711 Personal history of pulmonary embolism: Secondary | ICD-10-CM | POA: Diagnosis not present

## 2022-03-01 DIAGNOSIS — I700 Atherosclerosis of aorta: Secondary | ICD-10-CM | POA: Insufficient documentation

## 2022-03-01 DIAGNOSIS — R188 Other ascites: Secondary | ICD-10-CM | POA: Diagnosis not present

## 2022-03-01 DIAGNOSIS — R600 Localized edema: Secondary | ICD-10-CM | POA: Insufficient documentation

## 2022-03-01 DIAGNOSIS — D649 Anemia, unspecified: Secondary | ICD-10-CM | POA: Insufficient documentation

## 2022-03-01 DIAGNOSIS — G629 Polyneuropathy, unspecified: Secondary | ICD-10-CM | POA: Insufficient documentation

## 2022-03-01 DIAGNOSIS — I10 Essential (primary) hypertension: Secondary | ICD-10-CM | POA: Insufficient documentation

## 2022-03-01 DIAGNOSIS — L819 Disorder of pigmentation, unspecified: Secondary | ICD-10-CM | POA: Insufficient documentation

## 2022-03-01 LAB — CBC WITH DIFFERENTIAL (CANCER CENTER ONLY)
Abs Immature Granulocytes: 0.05 10*3/uL (ref 0.00–0.07)
Basophils Absolute: 0 10*3/uL (ref 0.0–0.1)
Basophils Relative: 1 %
Eosinophils Absolute: 0.3 10*3/uL (ref 0.0–0.5)
Eosinophils Relative: 5 %
HCT: 29.7 % — ABNORMAL LOW (ref 36.0–46.0)
Hemoglobin: 10.5 g/dL — ABNORMAL LOW (ref 12.0–15.0)
Immature Granulocytes: 1 %
Lymphocytes Relative: 43 %
Lymphs Abs: 2.4 10*3/uL (ref 0.7–4.0)
MCH: 38.3 pg — ABNORMAL HIGH (ref 26.0–34.0)
MCHC: 35.4 g/dL (ref 30.0–36.0)
MCV: 108.4 fL — ABNORMAL HIGH (ref 80.0–100.0)
Monocytes Absolute: 0.4 10*3/uL (ref 0.1–1.0)
Monocytes Relative: 7 %
Neutro Abs: 2.4 10*3/uL (ref 1.7–7.7)
Neutrophils Relative %: 43 %
Platelet Count: 245 10*3/uL (ref 150–400)
RBC: 2.74 MIL/uL — ABNORMAL LOW (ref 3.87–5.11)
RDW: 14.6 % (ref 11.5–15.5)
WBC Count: 5.5 10*3/uL (ref 4.0–10.5)
nRBC: 0 % (ref 0.0–0.2)

## 2022-03-01 LAB — CMP (CANCER CENTER ONLY)
ALT: 8 U/L (ref 0–44)
AST: 15 U/L (ref 15–41)
Albumin: 4.2 g/dL (ref 3.5–5.0)
Alkaline Phosphatase: 95 U/L (ref 38–126)
Anion gap: 7 (ref 5–15)
BUN: 19 mg/dL (ref 8–23)
CO2: 27 mmol/L (ref 22–32)
Calcium: 9.5 mg/dL (ref 8.9–10.3)
Chloride: 106 mmol/L (ref 98–111)
Creatinine: 1.07 mg/dL — ABNORMAL HIGH (ref 0.44–1.00)
GFR, Estimated: 56 mL/min — ABNORMAL LOW (ref >60)
Glucose, Bld: 110 mg/dL — ABNORMAL HIGH (ref 70–99)
Potassium: 3.7 mmol/L (ref 3.5–5.1)
Sodium: 140 mmol/L (ref 135–145)
Total Bilirubin: 0.3 mg/dL (ref 0.3–1.2)
Total Protein: 7.9 g/dL (ref 6.5–8.1)

## 2022-03-01 MED ORDER — SODIUM CHLORIDE 0.9% FLUSH
10.0000 mL | Freq: Once | INTRAVENOUS | Status: AC
  Administered 2022-03-01: 10 mL

## 2022-03-01 MED ORDER — HEPARIN SOD (PORK) LOCK FLUSH 100 UNIT/ML IV SOLN
500.0000 [IU] | Freq: Once | INTRAVENOUS | Status: AC
  Administered 2022-03-01: 500 [IU]

## 2022-03-01 NOTE — Progress Notes (Signed)
[place]   Telephone:(336) [phone] Fax:([phone]   Clinic Follow up Note   Patient Care Team: Pcp, No as PCP - General [name], MD as Consulting Physician (Hematology)  Date of Service:  03/01/2022  CHIEF COMPLAINT: f/u of peritoneal carcinomatosis  CURRENT THERAPY:  Xeloda 2000 mg BID days 1-14 q. 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   Suspected distal small bowel obstruction, with probable transition point in right lower quadrant.   Aortic Atherosclerosis (ICD10-I70.0).   04/22/2021 Imaging   US Abdomen  IMPRESSION: Small volume of lower abdominopelvic ascites.   04/24/2021 Pathology Results   Cytology  Specimen Submitted:  A. ASCITES, PARACENTESIS:   FINAL MICROSCOPIC DIAGNOSIS:  - Atypical cells present  DIAGNOSTIC COMMENTS:  Immunohistochemical stains show that the rare, mildly atypical cells are positive for calretinin and D2-40; while they are negative for PAX8, ER and MOC-31.  This immunoprofile is consistent with reactive mesothelial cells.    04/25/2021 Initial Diagnosis   Primary peritoneal carcinomatosis ([place])   04/29/2021 Imaging   CT AP  IMPRESSION: 1. Moderate ascites, mild peritoneal thickening and diffuse omental thickening/caking and smaller focal fluid/cystic areas within the RIGHT abdomen and LOWER pelvis, compatible with peritoneal carcinomatosis. Mild mesenteric stranding is nonspecific. 2. Small subserosal implants along the INFERIOR RIGHT liver. 3. Small LEFT pleural effusion and trace RIGHT pleural effusion with LEFT LOWER lung atelectasis. 4. Aortic Atherosclerosis (ICD10-I70.0).   04/30/2021 Pathology Results   FINAL MICROSCOPIC DIAGNOSIS:   A. 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   ADDENDUM:  By immunohistochemistry, HER-2 is EQUIVOCAL (2+).    FLOURESCENCE IN-SITU HYBRIDIZATION RESULTS:  GROUP 5:   HER2 **NEGATIVE**    05/04/2021 -  Chemotherapy   Patient is on Treatment Plan : GASTRIC FOLFOX q14d x 12 cycles     05/04/2021 Imaging   CT Chest  IMPRESSION: 1. Suspected segmental filling defects in the anterior segment right upper lobe and apical segment right upper lobe compatible with small pulmonary emboli. Clot burden appears small, although please note that today's examination was not a dedicated CT angiogram and as such might overestimate or underestimate pulmonary embolus. Positive for acute PE with CT evidence of right heart strain (RV/LV Ratio = 1.1) consistent with at least submassive (intermediate risk) PE. The presence of right heart strain has been associated with an increased risk of morbidity and mortality. Please refer to the "PE Focused" order set in EPIC. 2. No compelling findings of metastatic disease to the chest. There is a small left pleural effusion, but no definite enhancement along the pleura to suggest malignant or exudative etiology at this time. 3. There is some new gas in the soft tissues of the left thoracoabdominal wall, compatible subcutaneous emphysema, probably related to recent laparoscopy or other procedure. 4. Hypodense thyroid nodules up to 2.1 cm  in diameter. Recommend thyroid US (ref: J Am Coll Radiol. 2015 Feb;12(2): 143-50). 5. Left upper quadrant ascites, including fluid in the  lesser sac. This is reduced compared to the prior CT abdomen of 04/29/2021. 6. Stable appearance of periportal edema and hypodensities along the posterior margin of the right hepatic lobe.   05/07/2021 Pathology Results   FINAL MICROSCOPIC DIAGNOSIS:   A. DUODENUM, BULB, BIOPSY:  - Polypoid duodenal mucosa with chronic inflammation, foveolar  metaplasia and reactive changes  - Negative for dysplasia or malignancy   Cytology: Specimen Submitted:  A. ESOPHAGUS, BRUSHING:   FINAL MICROSCOPIC DIAGNOSIS:  - Benign reactive/reparative changes    05/16/2021 Cancer Staging   Staging form: Soft Tissue Sarcoma of the Abdomen and Thoracic Visceral Organs, AJCC 8th Edition - Clinical: cTX, cN0, pM1 - Signed by [name], MD on 05/16/2021   08/10/2021 Imaging   CT CAP IMPRESSION: 1. Subserosal implants along the inferior right liver are no longer apparent or decreased in size when compared to prior exam. 2. Interval resolution of abdominal ascites. Decreased peritoneal thickening and stranding. 3. Interval resolution of small left pleural effusion. 4.  Aortic Atherosclerosis (ICD10-I70.0).        INTERVAL HISTORY:  FLOYCE Stewart is here for a follow up of peritoneal carcinomatosis. She was last seen by Dr. Burr Medico on 01/23/22. She presents to the clinic alone.  [name] continues to do well without any significant limitations.  Her energy levels are stable and she continues to complete all her daily activities on her own.  She has a good appetite and denies any weight loss.  She denies any nausea, vomiting or abdominal pain.  Her bowel habits are unchanged without any recurrent episodes of diarrhea or constipation.  She denies easy bruising or signs of active bleeding.  She reports dark pigmentation in her  feet with Xeloda.  She denies any peeling or other skin changes.  Neuropathy stable involving her feet.  She denies fevers, chills, night sweats, shortness of breath, chest pain or cough.  She  has no other complaints.   All other systems were reviewed with the patient and are negative.  MEDICAL HISTORY:  Past Medical History:  Diagnosis Date   Acid reflux    Hypertension     SURGICAL HISTORY: Past Surgical History:  Procedure Laterality Date   BIOPSY  05/07/2021   Procedure: BIOPSY;  Surgeon: [name], MD;  Location: [facility];  Service: Endoscopy;;   BUNIONECTOMY Bilateral    ESOPHAGEAL BRUSHING  05/07/2021   Procedure: ESOPHAGEAL BRUSHING;  Surgeon: [name], MD;  Location: [facility];  Service: Endoscopy;;   ESOPHAGOGASTRODUODENOSCOPY (EGD) WITH PROPOFOL N/A 05/07/2021   Procedure: ESOPHAGOGASTRODUODENOSCOPY (EGD) WITH PROPOFOL;  Surgeon: [name], MD;  Location: [facility];  Service: Endoscopy;  Laterality: N/A;   HAND TENDON SURGERY Bilateral    IR IMAGING GUIDED PORT INSERTION  04/26/2021   IR PARACENTESIS  04/24/2021   LAPAROSCOPY N/A 04/30/2021   Procedure: LAPAROSCOPY DIAGNOSTIC WITH BIOPSIES;  Surgeon: Lafonda Mosses, MD;  Location: [facility];  Service: Gynecology;  Laterality: N/A;   PARTIAL HYSTERECTOMY      I have reviewed the social history and family history with the patient and they are unchanged from previous note.  ALLERGIES:  has No Known Allergies.  MEDICATIONS:  Current Outpatient Medications  Medication Sig Dispense Refill   amLODipine (NORVASC) 10 MG tablet Take 5 mg by mouth daily. Take 1 tab by mouth daily (Hold Dose for Systolic BP Less  than 100; Avoid Grapefruit or its juice)     apixaban (ELIQUIS) 5 MG TABS tablet Take 1 tablet (5 mg total) by mouth 2 (two) times daily. 60 tablet 2   ascorbic acid (VITAMIN C) 250 MG CHEW Chew 500 mg by mouth daily.     capecitabine (XELODA) 500 MG tablet Take 4 tablets every 12 hours,  for 14 days on, 7 days off. Repeat every 21 days. Take after a meal 112 tablet 1   Cholecalciferol (VITAMIN D3) 50 MCG (2000 UT) CHEW Chew 2 tablets by mouth daily.     furosemide (LASIX) 20 MG tablet TAKE 1  TABLET BY MOUTH ONCE DAILY ON MONDAY, WEDNESDAY, AND FRIDAY. TAKE WITH POTASSIUM 30 tablet 0   lidocaine-prilocaine (EMLA) cream Apply 1 application topically as needed. 30 g 0   lisinopril (ZESTRIL) 40 MG tablet Take 40 mg by mouth daily. TAKE 1 TAB BY MOUTH DAILY (HOLD DOSE FOR SYSTOLIC BLOOD PRESSURE LESS THAN 100; AVOID POTASSIUM SUPPLEMENT/ARTIFICIAL SALT) (HOLD DOSE FOR SYSTOLIC BLOOD PRESSURE LESS THAN 100; AVOID POTASSIUM SUPPLEMENTS/ARTIFICIAL SALT)     potassium chloride SA (KLOR-CON M20) 20 MEQ tablet TAKE 1 TABLET BY MOUTH ONCE DAILY ON MONDAY, WEDNESDAY, AND FRIDAY. TAKE WITH LASIX 30 tablet 0   prochlorperazine (COMPAZINE) 10 MG tablet Take 1 tablet (10 mg total) by mouth every 6 (six) hours as needed for nausea or vomiting. 30 tablet 2   sennosides-docusate sodium (SENOKOT-S) 8.6-50 MG tablet Take 2 tablets by mouth daily as needed for constipation.     No current facility-administered medications for this visit.    PHYSICAL EXAMINATION: ECOG PERFORMANCE STATUS: 1 - Symptomatic but completely ambulatory  Vitals:   03/01/22 1034  BP: 137/76  Pulse: 92  Resp: 18  Temp: 97.8 F (36.6 C)  SpO2: 96%   Wt Readings from Last 3 Encounters:  03/01/22 192 lb 8 oz (87.3 kg)  12/27/21 186 lb 4.8 oz (84.5 kg)  12/05/21 188 lb 3.2 oz (85.4 kg)     GENERAL:alert, no distress and comfortable SKIN: skin color normal, no rashes or significant lesions. Dark pigmentation in hands and feet.  EYES: normal, Conjunctiva are pink and non-injected, sclera clear  NEURO: alert & oriented x 3 with fluent speech  LABORATORY DATA:  I have reviewed the data as listed    Latest Ref Rng & Units 03/01/2022   10:10 AM 01/23/2022   11:12 AM 12/27/2021   10:03 AM  CBC  WBC 4.0 - 10.5 K/uL 5.5  5.1  5.7   Hemoglobin 12.0 - 15.0 g/dL 10.5  9.9  9.9   Hematocrit 36.0 - 46.0 % 29.7  28.8  29.2   Platelets 150 - 400 K/uL 245  149  179         Latest Ref Rng & Units 03/01/2022   10:10 AM 01/23/2022    11:12 AM 01/08/2022   12:50 PM  CMP  Glucose 70 - 99 mg/dL 110  111    BUN 8 - 23 mg/dL 19  26    Creatinine 0.44 - 1.00 mg/dL 1.07  1.01  1.30   Sodium 135 - 145 mmol/L 140  137    Potassium 3.5 - 5.1 mmol/L 3.7  3.5    Chloride 98 - 111 mmol/L 106  104    CO2 22 - 32 mmol/L 27  24    Calcium 8.9 - 10.3 mg/dL 9.5  9.5    Total Protein 6.5 - 8.1 g/dL 7.9  8.1  Total Bilirubin 0.3 - 1.2 mg/dL 0.3  1.0    Alkaline Phos 38 - 126 U/L 95  111    AST 15 - 41 U/L 15  24    ALT 0 - 44 U/L 8  12        RADIOGRAPHIC STUDIES: I have personally reviewed the radiological images as listed and agreed with the findings in the report. No results found.     ASSESSMENT & PLAN:  Brenda Stewart is a 71 y.o. female with   1. 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 CT AP 04/21/21 showed findings suspicious for diffuse peritoneal carcinomatosis. She was admitted and underwent paracentesis on 04/24/21 showing atypical cells. -Diagnostic laparoscopy on 04/30/21 with pathology from peritoneum biopsy showing metastatic carcinoma with signet ring cell features. IHC supports upper GI primary.  -Her EGD was negative for primary malignancy. -her cancer is PD-L1 negative and MMR normal  -she completed 12 cycles of FOLFOX 05/04/21-10/10/21, tolerated very well overall but stopped due to neuropathy.  -She started maintenance Xeloda on 10/29/21, she has tolerated very well with no noticeable side effects.  -restaging CT CAP on 01/08/22 showed NED. Recommend to continue Xeloda.  2. Side effects: Leg edema, hyperpigmentation -likely secondary to Xeloda -we prescribed lasix + KCL on 12/05/21 for her to take as needed, no more than 3 times a week. She will also elevate legs, use compression stockings, etc.    3. Mild Anemia -found to have anemia  while in the hospital -Hgb 10.5 today, improving   4. 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 She still reports residual tingling in her fingertips and toes, her handwriting has changed -I recommend to begin B complex vitamin. She does not want to take gabapentin again   5. H/o PE 04/2021 -incidental finding on staging chest CT on 05/04/21 -she was given heparin in the hospital and transitioned to Eliquis.  PLAN: -Labs from today were reviewed and do not require any intervention. -Recommend to continue Xeloda, 2000 mg BID for 2 weeks, then 1 week off -RTC in 3 weeks for a labs and f/u visit with Dr. Burr Medico   All questions were answered. The patient knows to call the clinic with any problems, questions or concerns. No barriers to learning was detected.    I have spent a total of 30 minutes minutes of face-to-face and non-face-to-face time, preparing to see the patient, performing a medically appropriate examination, counseling and educating the patient, documenting clinical information in the electronic health record, and care coordination.   Dede Query PA-C Dept of Hematology and [place] at [facility] Phone: [phone]

## 2022-03-02 LAB — CA 125: Cancer Antigen (CA) 125: 9.3 U/mL (ref 0.0–38.1)

## 2022-03-13 ENCOUNTER — Other Ambulatory Visit: Payer: Self-pay | Admitting: Hematology

## 2022-03-18 ENCOUNTER — Other Ambulatory Visit (HOSPITAL_COMMUNITY): Payer: Self-pay

## 2022-03-20 ENCOUNTER — Inpatient Hospital Stay: Payer: Medicare Other

## 2022-03-20 ENCOUNTER — Inpatient Hospital Stay: Payer: Medicare Other | Admitting: Hematology

## 2022-03-20 DIAGNOSIS — C786 Secondary malignant neoplasm of retroperitoneum and peritoneum: Secondary | ICD-10-CM

## 2022-03-21 ENCOUNTER — Telehealth: Payer: Self-pay | Admitting: Hematology

## 2022-03-21 ENCOUNTER — Other Ambulatory Visit (HOSPITAL_COMMUNITY): Payer: Self-pay

## 2022-03-21 NOTE — Telephone Encounter (Signed)
.  Called pt per 6/29 inbasket , Patient was unavailable, a message with appt time and date was left with number on file.

## 2022-03-31 NOTE — Progress Notes (Unsigned)
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   Suspected distal small bowel obstruction, with probable transition point in right lower quadrant.   Aortic Atherosclerosis (ICD10-I70.0).   04/22/2021 Imaging   US Abdomen  IMPRESSION: Small volume of lower abdominopelvic ascites.   04/24/2021 Pathology Results   Cytology  Specimen Submitted:  A. ASCITES, PARACENTESIS:   FINAL MICROSCOPIC DIAGNOSIS:  - Atypical cells present  DIAGNOSTIC COMMENTS:  Immunohistochemical stains show that the rare, mildly atypical cells are positive for calretinin and D2-40; while they are negative for PAX8, ER and MOC-31.  This immunoprofile is consistent with reactive mesothelial cells.    04/25/2021 Initial Diagnosis   Primary peritoneal carcinomatosis ([place])   04/29/2021 Imaging   CT AP  IMPRESSION: 1. Moderate ascites, mild peritoneal thickening and diffuse omental thickening/caking and smaller focal fluid/cystic areas within the RIGHT abdomen and LOWER pelvis, compatible with peritoneal carcinomatosis. Mild mesenteric stranding is nonspecific. 2. Small subserosal implants along the INFERIOR RIGHT liver. 3. Small LEFT pleural effusion and trace RIGHT pleural effusion with LEFT LOWER lung atelectasis. 4. Aortic Atherosclerosis (ICD10-I70.0).   04/30/2021 Pathology Results   FINAL MICROSCOPIC DIAGNOSIS:   A. 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 PERTIONEUM, ANTERIOR, EXCISION:  -  Metastatic carcinoma with signet  ring cell features  -  See comment   COMMENT:   By immunohistochemistry (performed on blocks A2 and B1), the neoplastic cells are positive for cytokeratin AE1/3, cytokeratin 20 and CDX2 but negative for TTF-1, ER, GATA3, PAX 8 and cytokeratin 7.  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   ADDENDUM:  By immunohistochemistry, HER-2 is EQUIVOCAL (2+).    FLOURESCENCE IN-SITU HYBRIDIZATION RESULTS:  GROUP 5:   HER2 **NEGATIVE**    05/04/2021 -  Chemotherapy   Patient is on Treatment Plan : GASTRIC FOLFOX q14d x 12 cycles     05/04/2021 Imaging   CT Chest  IMPRESSION: 1. Suspected segmental filling defects in the anterior segment right upper lobe and apical segment right upper lobe compatible with small pulmonary emboli. Clot burden appears small, although please note that today's examination was not a dedicated CT angiogram and as such might overestimate or underestimate pulmonary embolus. Positive for acute PE with CT evidence of right heart strain (RV/LV Ratio = 1.1) consistent with at least submassive (intermediate risk) PE. The presence of right heart strain has been associated with an increased risk of morbidity and mortality. Please refer to the "PE Focused" order set in EPIC. 2. No compelling findings of metastatic disease to the chest. There is a small left pleural effusion, but no definite enhancement along the pleura to suggest malignant or exudative etiology at this time. 3. There is some new gas in the soft tissues of the left thoracoabdominal wall, compatible subcutaneous emphysema, probably related to recent laparoscopy or other procedure. 4. Hypodense thyroid nodules up to 2.1 cm in diameter. Recommend thyroid US (ref: J Am Coll Radiol. 2015 Feb;12(2): 143-50). 5. Left upper quadrant ascites, including  fluid in the lesser sac. This is reduced compared to the prior CT abdomen of 04/29/2021. 6. Stable  appearance of periportal edema and hypodensities along the posterior margin of the right hepatic lobe.   05/07/2021 Pathology Results   FINAL MICROSCOPIC DIAGNOSIS:   A. DUODENUM, BULB, BIOPSY:  - Polypoid duodenal mucosa with chronic inflammation, foveolar  metaplasia and reactive changes  - Negative for dysplasia or malignancy   Cytology: Specimen Submitted:  A. ESOPHAGUS, BRUSHING:   FINAL MICROSCOPIC DIAGNOSIS:  - Benign reactive/reparative changes    05/16/2021 Cancer Staging   Staging form: Soft Tissue Sarcoma of the Abdomen and Thoracic Visceral Organs, AJCC 8th Edition - Clinical: cTX, cN0, pM1 - Signed by [name], MD on 05/16/2021   08/10/2021 Imaging   CT CAP IMPRESSION: 1. Subserosal implants along the inferior right liver are no longer apparent or decreased in size when compared to prior exam. 2. Interval resolution of abdominal ascites. Decreased peritoneal thickening and stranding. 3. Interval resolution of small left pleural effusion. 4.  Aortic Atherosclerosis (ICD10-I70.0).       CURRENT THERAPY: Xeloda 2000 mg BID on days 1-14 q21 days, starting 10/29/21  INTERVAL HISTORY: Ms. Brenda Stewart returns for follow up as scheduled. Last seen by my colleague [employer], [place] 03/01/22. She continues Xeloda.    REVIEW OF SYSTEMS:   Constitutional: Denies fevers, chills or abnormal weight loss Eyes: Denies blurriness of vision Ears, nose, mouth, throat, and face: Denies mucositis or sore throat Respiratory: Denies cough, dyspnea or wheezes Cardiovascular: Denies palpitation, chest discomfort or lower extremity swelling Gastrointestinal:  Denies nausea, heartburn or change in bowel habits Skin: Denies abnormal skin rashes Lymphatics: Denies new lymphadenopathy or easy bruising Neurological:Denies numbness, tingling or new weaknesses Behavioral/Psych: Mood is stable, no new changes  All other systems were reviewed with the patient and are negative.  MEDICAL HISTORY:  Past  Medical History:  Diagnosis Date   Acid reflux    Hypertension     SURGICAL HISTORY: Past Surgical History:  Procedure Laterality Date   BIOPSY  05/07/2021   Procedure: BIOPSY;  Surgeon: [name], MD;  Location: [facility];  Service: Endoscopy;;   BUNIONECTOMY Bilateral    ESOPHAGEAL BRUSHING  05/07/2021   Procedure: ESOPHAGEAL BRUSHING;  Surgeon: [name], MD;  Location: [facility];  Service: Endoscopy;;   ESOPHAGOGASTRODUODENOSCOPY (EGD) WITH PROPOFOL N/A 05/07/2021   Procedure: ESOPHAGOGASTRODUODENOSCOPY (EGD) WITH PROPOFOL;  Surgeon: [name], MD;  [facility];  Service: Endoscopy;  Laterality: N/A;   HAND TENDON SURGERY Bilateral    IR IMAGING GUIDED PORT INSERTION  04/26/2021   IR PARACENTESIS  04/24/2021   LAPAROSCOPY N/A 04/30/2021   Procedure: LAPAROSCOPY DIAGNOSTIC WITH BIOPSIES;  Surgeon: Lafonda Mosses, MD;  Location: [facility];  Service: Gynecology;  Laterality: N/A;   PARTIAL HYSTERECTOMY      I have reviewed the social history and family history with the patient and they are unchanged from previous note.  ALLERGIES:  has No Known Allergies.  MEDICATIONS:  Current Outpatient Medications  Medication Sig Dispense Refill   amLODipine (NORVASC) 10 MG tablet Take 5 mg by mouth daily. Take 1 tab by mouth daily (Hold Dose for Systolic BP Less than 196; Avoid Grapefruit or its juice)     apixaban (ELIQUIS) 5 MG TABS tablet Take 1 tablet (5 mg total) by mouth 2 (two) times daily. 60 tablet 2   ascorbic acid (VITAMIN C) 250 MG CHEW Chew 500 mg by mouth daily.     capecitabine (XELODA)  500 MG tablet Take 4 tablets every 12 hours,  for 14 days on, 7 days off. Repeat every 21 days. Take after a meal 112 tablet 1   Cholecalciferol (VITAMIN D3) 50 MCG (2000 UT) CHEW Chew 2 tablets by mouth daily.     furosemide (LASIX) 20 MG tablet TAKE 1 TABLET BY MOUTH ONCE DAILY ON MONDAY, WEDNESDAY, AND FRIDAY. TAKE WITH POTASSIUM 30 tablet 0   lidocaine-prilocaine (EMLA)  cream Apply 1 application topically as needed. 30 g 0   lisinopril (ZESTRIL) 40 MG tablet Take 40 mg by mouth daily. TAKE 1 TAB BY MOUTH DAILY (HOLD DOSE FOR SYSTOLIC BLOOD PRESSURE LESS THAN 100; AVOID POTASSIUM SUPPLEMENT/ARTIFICIAL SALT) (HOLD DOSE FOR SYSTOLIC BLOOD PRESSURE LESS THAN 100; AVOID POTASSIUM SUPPLEMENTS/ARTIFICIAL SALT)     potassium chloride SA (KLOR-CON M20) 20 MEQ tablet TAKE 1 TABLET BY MOUTH ONCE DAILY ON MONDAY, WEDNESDAY, AND FRIDAY. TAKE WITH LASIX 30 tablet 0   prochlorperazine (COMPAZINE) 10 MG tablet Take 1 tablet (10 mg total) by mouth every 6 (six) hours as needed for nausea or vomiting. 30 tablet 2   sennosides-docusate sodium (SENOKOT-S) 8.6-50 MG tablet Take 2 tablets by mouth daily as needed for constipation.     No current facility-administered medications for this visit.    PHYSICAL EXAMINATION: ECOG PERFORMANCE STATUS: {CHL ONC ECOG PS:[phone]}  There were no vitals filed for this visit. There were no vitals filed for this visit.  GENERAL:alert, no distress and comfortable SKIN: skin color, texture, turgor are normal, no rashes or significant lesions EYES: normal, Conjunctiva are pink and non-injected, sclera clear OROPHARYNX:no exudate, no erythema and lips, buccal mucosa, and tongue normal  NECK: supple, thyroid normal size, non-tender, without nodularity LYMPH:  no palpable lymphadenopathy in the cervical, axillary or inguinal LUNGS: clear to auscultation and percussion with normal breathing effort HEART: regular rate & rhythm and no murmurs and no lower extremity edema ABDOMEN:abdomen soft, non-tender and normal bowel sounds Musculoskeletal:no cyanosis of digits and no clubbing  NEURO: alert & oriented x 3 with fluent speech, no focal motor/sensory deficits  LABORATORY DATA:  I have reviewed the data as listed    Latest Ref Rng & Units 03/01/2022   10:10 AM 01/23/2022   11:12 AM 12/27/2021   10:03 AM  CBC  WBC 4.0 - 10.5 K/uL 5.5  5.1  5.7    Hemoglobin 12.0 - 15.0 g/dL 10.5  9.9  9.9   Hematocrit 36.0 - 46.0 % 29.7  28.8  29.2   Platelets 150 - 400 K/uL 245  149  179         Latest Ref Rng & Units 03/01/2022   10:10 AM 01/23/2022   11:12 AM 01/08/2022   12:50 PM  CMP  Glucose 70 - 99 mg/dL 110  111    BUN 8 - 23 mg/dL 19  26    Creatinine 0.44 - 1.00 mg/dL 1.07  1.01  1.30   Sodium 135 - 145 mmol/L 140  137    Potassium 3.5 - 5.1 mmol/L 3.7  3.5    Chloride 98 - 111 mmol/L 106  104    CO2 22 - 32 mmol/L 27  24    Calcium 8.9 - 10.3 mg/dL 9.5  9.5    Total Protein 6.5 - 8.1 g/dL 7.9  8.1    Total Bilirubin 0.3 - 1.2 mg/dL 0.3  1.0    Alkaline Phos 38 - 126 U/L 95  111    AST 15 - 41  U/L 15  24    ALT 0 - 44 U/L 8  12        RADIOGRAPHIC STUDIES: I have personally reviewed the radiological images as listed and agreed with the findings in the report. No results found.   ASSESSMENT & PLAN:  No problem-specific Assessment & Plan notes found for this encounter.   No orders of the defined types were placed in this encounter.  All questions were answered. The patient knows to call the clinic with any problems, questions or concerns. No barriers to learning was detected. I spent {CHL ONC TIME VISIT - PIIRX:[id number]} counseling the patient face to face. The total time spent in the appointment was {CHL ONC TIME VISIT - LDZWA:[id number]} and more than 50% was on counseling and review of test results     Alla Feeling, NP 03/31/22

## 2022-04-01 ENCOUNTER — Other Ambulatory Visit: Payer: Self-pay

## 2022-04-01 DIAGNOSIS — C482 Malignant neoplasm of peritoneum, unspecified: Secondary | ICD-10-CM

## 2022-04-02 ENCOUNTER — Encounter: Payer: Self-pay | Admitting: Nurse Practitioner

## 2022-04-02 ENCOUNTER — Inpatient Hospital Stay (HOSPITAL_BASED_OUTPATIENT_CLINIC_OR_DEPARTMENT_OTHER): Payer: Medicare Other | Admitting: Nurse Practitioner

## 2022-04-02 ENCOUNTER — Other Ambulatory Visit: Payer: Self-pay

## 2022-04-02 ENCOUNTER — Inpatient Hospital Stay: Payer: Medicare Other | Attending: Hematology

## 2022-04-02 VITALS — BP 140/77 | HR 79 | Temp 98.1°F | Resp 16 | Ht 67.0 in | Wt 193.9 lb

## 2022-04-02 DIAGNOSIS — D649 Anemia, unspecified: Secondary | ICD-10-CM | POA: Insufficient documentation

## 2022-04-02 DIAGNOSIS — Z7901 Long term (current) use of anticoagulants: Secondary | ICD-10-CM | POA: Diagnosis not present

## 2022-04-02 DIAGNOSIS — J90 Pleural effusion, not elsewhere classified: Secondary | ICD-10-CM | POA: Diagnosis not present

## 2022-04-02 DIAGNOSIS — R109 Unspecified abdominal pain: Secondary | ICD-10-CM | POA: Diagnosis not present

## 2022-04-02 DIAGNOSIS — C269 Malignant neoplasm of ill-defined sites within the digestive system: Secondary | ICD-10-CM | POA: Diagnosis present

## 2022-04-02 DIAGNOSIS — C482 Malignant neoplasm of peritoneum, unspecified: Secondary | ICD-10-CM

## 2022-04-02 DIAGNOSIS — L819 Disorder of pigmentation, unspecified: Secondary | ICD-10-CM | POA: Insufficient documentation

## 2022-04-02 DIAGNOSIS — I700 Atherosclerosis of aorta: Secondary | ICD-10-CM | POA: Insufficient documentation

## 2022-04-02 DIAGNOSIS — Z79899 Other long term (current) drug therapy: Secondary | ICD-10-CM | POA: Diagnosis not present

## 2022-04-02 DIAGNOSIS — Z9221 Personal history of antineoplastic chemotherapy: Secondary | ICD-10-CM | POA: Insufficient documentation

## 2022-04-02 DIAGNOSIS — Z95828 Presence of other vascular implants and grafts: Secondary | ICD-10-CM

## 2022-04-02 DIAGNOSIS — Z86711 Personal history of pulmonary embolism: Secondary | ICD-10-CM | POA: Diagnosis not present

## 2022-04-02 DIAGNOSIS — R188 Other ascites: Secondary | ICD-10-CM | POA: Diagnosis not present

## 2022-04-02 DIAGNOSIS — C786 Secondary malignant neoplasm of retroperitoneum and peritoneum: Secondary | ICD-10-CM | POA: Diagnosis present

## 2022-04-02 DIAGNOSIS — I10 Essential (primary) hypertension: Secondary | ICD-10-CM | POA: Diagnosis not present

## 2022-04-02 DIAGNOSIS — G629 Polyneuropathy, unspecified: Secondary | ICD-10-CM | POA: Diagnosis not present

## 2022-04-02 LAB — COMPREHENSIVE METABOLIC PANEL
ALT: 10 U/L (ref 0–44)
AST: 17 U/L (ref 15–41)
Albumin: 4.1 g/dL (ref 3.5–5.0)
Alkaline Phosphatase: 94 U/L (ref 38–126)
Anion gap: 8 (ref 5–15)
BUN: 18 mg/dL (ref 8–23)
CO2: 24 mmol/L (ref 22–32)
Calcium: 9.4 mg/dL (ref 8.9–10.3)
Chloride: 106 mmol/L (ref 98–111)
Creatinine, Ser: 1.08 mg/dL — ABNORMAL HIGH (ref 0.44–1.00)
GFR, Estimated: 56 mL/min — ABNORMAL LOW (ref >60)
Glucose, Bld: 99 mg/dL (ref 70–99)
Potassium: 3.5 mmol/L (ref 3.5–5.1)
Sodium: 138 mmol/L (ref 135–145)
Total Bilirubin: 0.4 mg/dL (ref 0.3–1.2)
Total Protein: 7.8 g/dL (ref 6.5–8.1)

## 2022-04-02 LAB — CBC WITH DIFFERENTIAL/PLATELET
Abs Immature Granulocytes: 0.04 10*3/uL (ref 0.00–0.07)
Basophils Absolute: 0.1 10*3/uL (ref 0.0–0.1)
Basophils Relative: 1 %
Eosinophils Absolute: 0.2 10*3/uL (ref 0.0–0.5)
Eosinophils Relative: 3 %
HCT: 29 % — ABNORMAL LOW (ref 36.0–46.0)
Hemoglobin: 10.1 g/dL — ABNORMAL LOW (ref 12.0–15.0)
Immature Granulocytes: 1 %
Lymphocytes Relative: 34 %
Lymphs Abs: 2.2 10*3/uL (ref 0.7–4.0)
MCH: 38 pg — ABNORMAL HIGH (ref 26.0–34.0)
MCHC: 34.8 g/dL (ref 30.0–36.0)
MCV: 109 fL — ABNORMAL HIGH (ref 80.0–100.0)
Monocytes Absolute: 0.7 10*3/uL (ref 0.1–1.0)
Monocytes Relative: 11 %
Neutro Abs: 3.2 10*3/uL (ref 1.7–7.7)
Neutrophils Relative %: 50 %
Platelets: 225 10*3/uL (ref 150–400)
RBC: 2.66 MIL/uL — ABNORMAL LOW (ref 3.87–5.11)
RDW: 13.9 % (ref 11.5–15.5)
WBC: 6.3 10*3/uL (ref 4.0–10.5)
nRBC: 0 % (ref 0.0–0.2)

## 2022-04-02 MED ORDER — HEPARIN SOD (PORK) LOCK FLUSH 100 UNIT/ML IV SOLN
500.0000 [IU] | Freq: Once | INTRAVENOUS | Status: AC
  Administered 2022-04-02: 500 [IU]

## 2022-04-02 MED ORDER — SODIUM CHLORIDE 0.9% FLUSH
10.0000 mL | Freq: Once | INTRAVENOUS | Status: AC
  Administered 2022-04-02: 10 mL

## 2022-04-03 ENCOUNTER — Telehealth: Payer: Self-pay | Admitting: Hematology

## 2022-04-03 LAB — CA 125: Cancer Antigen (CA) 125: 11.9 U/mL (ref 0.0–38.1)

## 2022-04-03 NOTE — Telephone Encounter (Signed)
Unable to leave message with follow-up appointment per 7/11 los. Mailed calendar.

## 2022-04-04 ENCOUNTER — Other Ambulatory Visit (HOSPITAL_COMMUNITY): Payer: Self-pay

## 2022-04-04 ENCOUNTER — Telehealth: Payer: Self-pay | Admitting: Surgery

## 2022-04-04 NOTE — Telephone Encounter (Signed)
Per Cira Rue, PA, I called the pt to discuss her lab results.  The pt's CA 125 has remained normal, so no concerns right now.  The pt verbalized understanding of these results, and she was told to call our office if she had any questions or concerns.

## 2022-04-04 NOTE — Telephone Encounter (Signed)
Attempted to call pt regarding her lab results.  Will try to call the pt again later.

## 2022-04-05 ENCOUNTER — Other Ambulatory Visit (HOSPITAL_COMMUNITY): Payer: Self-pay

## 2022-04-09 ENCOUNTER — Telehealth: Payer: Self-pay

## 2022-04-09 ENCOUNTER — Other Ambulatory Visit: Payer: Self-pay

## 2022-04-09 NOTE — Telephone Encounter (Signed)
Pt LVM regarding whether she is to get a refill for her Xeloda for her next cycle.  Returned pt's call but was unable to reach patient.  Pt's voicemail box is full; therefore, no voicemail message was left.  Based off CA125 results were normal as well at recent CT Scan which did not show disease progression per Cira Rue, NP last office note.  The plan is for pt to return to clinic on 04/15/2022 which is the pt's off wk of Capecitabine.  Pt will be restaged and treatment plan adjustments will be done based off the restaging results.  Awaiting pt's return telephone call.

## 2022-04-10 ENCOUNTER — Other Ambulatory Visit (HOSPITAL_COMMUNITY): Payer: Self-pay

## 2022-04-10 ENCOUNTER — Other Ambulatory Visit: Payer: Self-pay | Admitting: Hematology

## 2022-04-10 DIAGNOSIS — C786 Secondary malignant neoplasm of retroperitoneum and peritoneum: Secondary | ICD-10-CM

## 2022-04-10 MED ORDER — CAPECITABINE 500 MG PO TABS
ORAL_TABLET | ORAL | 2 refills | Status: DC
  Filled 2022-04-10: qty 112, fill #0
  Filled 2022-04-10: qty 112, 21d supply, fill #0

## 2022-04-11 ENCOUNTER — Other Ambulatory Visit (HOSPITAL_COMMUNITY): Payer: Self-pay

## 2022-04-15 ENCOUNTER — Inpatient Hospital Stay: Payer: Medicare Other

## 2022-04-15 ENCOUNTER — Other Ambulatory Visit: Payer: Self-pay

## 2022-04-15 ENCOUNTER — Encounter: Payer: Self-pay | Admitting: Hematology

## 2022-04-15 ENCOUNTER — Inpatient Hospital Stay (HOSPITAL_BASED_OUTPATIENT_CLINIC_OR_DEPARTMENT_OTHER): Payer: Medicare Other | Admitting: Hematology

## 2022-04-15 VITALS — BP 154/85 | HR 84 | Temp 98.0°F | Resp 15 | Wt 190.3 lb

## 2022-04-15 DIAGNOSIS — C482 Malignant neoplasm of peritoneum, unspecified: Secondary | ICD-10-CM

## 2022-04-15 DIAGNOSIS — C786 Secondary malignant neoplasm of retroperitoneum and peritoneum: Secondary | ICD-10-CM

## 2022-04-15 DIAGNOSIS — C269 Malignant neoplasm of ill-defined sites within the digestive system: Secondary | ICD-10-CM | POA: Diagnosis not present

## 2022-04-15 LAB — CBC WITH DIFFERENTIAL (CANCER CENTER ONLY)
Abs Immature Granulocytes: 0.06 10*3/uL (ref 0.00–0.07)
Basophils Absolute: 0.1 10*3/uL (ref 0.0–0.1)
Basophils Relative: 1 %
Eosinophils Absolute: 0.2 10*3/uL (ref 0.0–0.5)
Eosinophils Relative: 3 %
HCT: 31.5 % — ABNORMAL LOW (ref 36.0–46.0)
Hemoglobin: 11 g/dL — ABNORMAL LOW (ref 12.0–15.0)
Immature Granulocytes: 1 %
Lymphocytes Relative: 25 %
Lymphs Abs: 1.6 10*3/uL (ref 0.7–4.0)
MCH: 38.6 pg — ABNORMAL HIGH (ref 26.0–34.0)
MCHC: 34.9 g/dL (ref 30.0–36.0)
MCV: 110.5 fL — ABNORMAL HIGH (ref 80.0–100.0)
Monocytes Absolute: 0.5 10*3/uL (ref 0.1–1.0)
Monocytes Relative: 8 %
Neutro Abs: 4.1 10*3/uL (ref 1.7–7.7)
Neutrophils Relative %: 62 %
Platelet Count: 251 10*3/uL (ref 150–400)
RBC: 2.85 MIL/uL — ABNORMAL LOW (ref 3.87–5.11)
RDW: 14.6 % (ref 11.5–15.5)
WBC Count: 6.5 10*3/uL (ref 4.0–10.5)
nRBC: 0 % (ref 0.0–0.2)

## 2022-04-15 LAB — CMP (CANCER CENTER ONLY)
ALT: 10 U/L (ref 0–44)
AST: 18 U/L (ref 15–41)
Albumin: 4.1 g/dL (ref 3.5–5.0)
Alkaline Phosphatase: 90 U/L (ref 38–126)
Anion gap: 9 (ref 5–15)
BUN: 16 mg/dL (ref 8–23)
CO2: 25 mmol/L (ref 22–32)
Calcium: 9.5 mg/dL (ref 8.9–10.3)
Chloride: 107 mmol/L (ref 98–111)
Creatinine: 1.1 mg/dL — ABNORMAL HIGH (ref 0.44–1.00)
GFR, Estimated: 54 mL/min — ABNORMAL LOW (ref >60)
Glucose, Bld: 96 mg/dL (ref 70–99)
Potassium: 3.8 mmol/L (ref 3.5–5.1)
Sodium: 141 mmol/L (ref 135–145)
Total Bilirubin: 0.6 mg/dL (ref 0.3–1.2)
Total Protein: 7.6 g/dL (ref 6.5–8.1)

## 2022-04-15 NOTE — Progress Notes (Signed)
[place]   [phone] [phone]   Clinic Follow up Note   Patient Care Team: Pcp, No as PCP - General [name], MD as Consulting Physician (Hematology)  Date of Service:  04/15/2022  CHIEF COMPLAINT: f/u of peritoneal carcinomatosis  CURRENT THERAPY:  Xeloda 2000 mg BID days 1-14 q. 21 days, starting 10/29/21  ASSESSMENT & PLAN:  Brenda Stewart is a 71 y.o. female with   1. 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 CT AP 04/21/21 showed findings suspicious for diffuse peritoneal carcinomatosis. She was admitted and underwent paracentesis on 04/24/21 showing atypical cells. -Diagnostic laparoscopy on 04/30/21 with pathology from peritoneum biopsy showing metastatic carcinoma with signet ring cell features. IHC supports upper GI primary.  -Her EGD was negative for primary malignancy. -her cancer is PD-L1 negative and MMR normal  -she completed 12 cycles of FOLFOX 05/04/21 - 10/10/21, tolerated very well overall but stopped due to neuropathy.  -She started maintenance Xeloda on 10/29/21, she has tolerated very well with only some hyperpigmentation, no other noticeable side effects. -restaging CT CAP on 01/08/22 showed NED. Plan to continue Xeloda. -she continues to do well on Xeloda. Labs reviewed, overall stable to improved. Physical exam was unremarkable. Will continue. -will repeat CT scan in Oct    2. Mild Anemia -found to have anemia while in the hospital -Hgb 11 today (04/15/22), improving   3. 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 She still reports residual tingling in her fingertips and toes, her handwriting has changed -I recommend to begin B complex vitamin. She does not want to take gabapentin again    4. H/o PE 04/2021 -incidental finding on staging chest CT on 05/04/21 -she was given heparin in the hospital and transitioned to Eliquis.    PLAN: -continue Xeloda at same dose and schedule  -lab and f/u in 6 weeks, will order restaging scan on next visit    No problem-specific Assessment & Plan notes found for this encounter.   SUMMARY OF ONCOLOGIC HISTORY: Oncology History  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[place])  04/21/2021 Imaging   CT AP w/o contrast  IMPRESSION: Findings highly suspicious for diffuse peritoneal carcinomatosis.   Suspected distal small bowel obstruction, with probable transition point in right lower quadrant.   Aortic Atherosclerosis (ICD10-I70.0).   04/22/2021 Imaging   US Abdomen  IMPRESSION: Small volume of lower abdominopelvic ascites.   04/24/2021 Pathology Results   Cytology  Specimen Submitted:  A. ASCITES, PARACENTESIS:   FINAL MICROSCOPIC DIAGNOSIS:  - Atypical cells present  DIAGNOSTIC COMMENTS:  Immunohistochemical stains show that the rare, mildly atypical cells are positive for calretinin and D2-40; while they are negative for PAX8, ER and MOC-31.  This immunoprofile is consistent with reactive mesothelial cells.    04/25/2021 Initial Diagnosis   Primary peritoneal carcinomatosis ([place])   04/29/2021 Imaging   CT AP  IMPRESSION: 1. Moderate ascites, mild peritoneal thickening and diffuse omental thickening/caking and smaller focal fluid/cystic areas within the RIGHT abdomen and LOWER pelvis, compatible with peritoneal carcinomatosis. Mild mesenteric stranding is nonspecific. 2. Small subserosal implants along the INFERIOR RIGHT liver. 3. Small LEFT pleural effusion and trace RIGHT pleural effusion with LEFT LOWER lung atelectasis. 4. Aortic Atherosclerosis (ICD10-I70.0).   04/30/2021 Pathology Results   FINAL MICROSCOPIC DIAGNOSIS:  A. PERITONEUM, BIOPSY:  -  Metastatic carcinoma with signet ring cell features  -  See comment    B. 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   ADDENDUM:  By immunohistochemistry, HER-2 is EQUIVOCAL (2+).    FLOURESCENCE IN-SITU HYBRIDIZATION RESULTS:  GROUP 5:   HER2 **NEGATIVE**    05/04/2021 -  Chemotherapy   Patient is on Treatment Plan : GASTRIC FOLFOX q14d x 12 cycles     05/04/2021 Imaging   CT Chest  IMPRESSION: 1. Suspected segmental filling defects in the anterior segment right upper lobe and apical segment right upper lobe compatible with small pulmonary emboli. Clot burden appears small, although please note that today's examination was not a dedicated CT angiogram and as such might overestimate or underestimate pulmonary embolus. Positive for acute PE with CT evidence of right heart strain (RV/LV Ratio = 1.1) consistent with at least submassive (intermediate risk) PE. The presence of right heart strain has been associated with an increased risk of morbidity and mortality. Please refer to the "PE Focused" order set in EPIC. 2. No compelling findings of metastatic disease to the chest. There is a small left pleural effusion, but no definite enhancement along the pleura to suggest malignant or exudative etiology at this time. 3. There is some new gas in the soft tissues of the left thoracoabdominal wall, compatible subcutaneous emphysema, probably related to recent laparoscopy or other procedure. 4. Hypodense thyroid nodules up to 2.1 cm in diameter. Recommend thyroid US (ref: J Am Coll Radiol. 2015 Feb;12(2): 143-50). 5. Left upper quadrant ascites, including fluid in the lesser sac. This  is reduced compared to the prior CT abdomen of 04/29/2021. 6. Stable appearance of periportal edema and hypodensities along the posterior margin of the right hepatic lobe.   05/07/2021 Pathology Results   FINAL MICROSCOPIC DIAGNOSIS:   A. DUODENUM, BULB, BIOPSY:  - Polypoid duodenal mucosa with chronic inflammation, foveolar  metaplasia and reactive changes  - Negative for dysplasia or malignancy   Cytology: Specimen Submitted:  A. ESOPHAGUS, BRUSHING:   FINAL MICROSCOPIC DIAGNOSIS:  - Benign reactive/reparative changes    05/16/2021 Cancer Staging   Staging form: Soft Tissue Sarcoma of the Abdomen and Thoracic Visceral Organs, AJCC 8th Edition - Clinical: cTX, cN0, pM1 - Signed by [name], MD on 05/16/2021   08/10/2021 Imaging   CT CAP IMPRESSION: 1. Subserosal implants along the inferior right liver are no longer apparent or decreased in size when compared to prior exam. 2. Interval resolution of abdominal ascites. Decreased peritoneal thickening and stranding. 3. Interval resolution of small left pleural effusion. 4.  Aortic Atherosclerosis (ICD10-I70.0).        INTERVAL HISTORY:  Brenda Stewart is here for a follow up of peritoneal carcinomatosis. She was last seen by NP Lacie on 04/02/22. She presents to the clinic alone. She reports she is doing very well overall. She notes skin darkening on her feet from Xeloda. She reports sometimes she has residual neuropathy from prior treatment. She notes regular bowel movements, except with dairy due to lactose intolerance.   All other systems were reviewed with the  patient and are negative.  MEDICAL HISTORY:  Past Medical History:  Diagnosis Date   Acid reflux    Hypertension     SURGICAL HISTORY: Past Surgical History:  Procedure Laterality Date   BIOPSY  05/07/2021   Procedure: BIOPSY;  Surgeon: [name], MD;  Location: [facility];  Service: Endoscopy;;   BUNIONECTOMY Bilateral    ESOPHAGEAL BRUSHING  05/07/2021    Procedure: ESOPHAGEAL BRUSHING;  Surgeon: [name], MD;  Location: [facility];  Service: Endoscopy;;   ESOPHAGOGASTRODUODENOSCOPY (EGD) WITH PROPOFOL N/A 05/07/2021   Procedure: ESOPHAGOGASTRODUODENOSCOPY (EGD) WITH PROPOFOL;  Surgeon: [name], MD;  Location: [facility];  Service: Endoscopy;  Laterality: N/A;   HAND TENDON SURGERY Bilateral    IR IMAGING GUIDED PORT INSERTION  04/26/2021   IR PARACENTESIS  04/24/2021   LAPAROSCOPY N/A 04/30/2021   Procedure: LAPAROSCOPY DIAGNOSTIC WITH BIOPSIES;  Surgeon: Lafonda Mosses, MD;  Location: [facility];  Service: Gynecology;  Laterality: N/A;   PARTIAL HYSTERECTOMY      I have reviewed the social history and family history with the patient and they are unchanged from previous note.  ALLERGIES:  has No Known Allergies.  MEDICATIONS:  Current Outpatient Medications  Medication Sig Dispense Refill   amLODipine (NORVASC) 10 MG tablet Take 5 mg by mouth daily. Take 1 tab by mouth daily (Hold Dose for Systolic BP Less than 932; Avoid Grapefruit or its juice)     apixaban (ELIQUIS) 5 MG TABS tablet Take 1 tablet (5 mg total) by mouth 2 (two) times daily. 60 tablet 2   ascorbic acid (VITAMIN C) 250 MG CHEW Chew 500 mg by mouth daily.     capecitabine (XELODA) 500 MG tablet Take 4 tablets every 12 hours,  for 14 days on, 7 days off. Repeat every 21 days. Take after a meal 112 tablet 2   Cholecalciferol (VITAMIN D3) 50 MCG (2000 UT) CHEW Chew 2 tablets by mouth daily.     furosemide (LASIX) 20 MG tablet TAKE 1 TABLET BY MOUTH ONCE DAILY ON MONDAY, WEDNESDAY, AND FRIDAY. TAKE WITH POTASSIUM 30 tablet 0   lidocaine-prilocaine (EMLA) cream Apply 1 application topically as needed. 30 g 0   lisinopril (ZESTRIL) 40 MG tablet Take 40 mg by mouth daily. TAKE 1 TAB BY MOUTH DAILY (HOLD DOSE FOR SYSTOLIC BLOOD PRESSURE LESS THAN 100; AVOID POTASSIUM SUPPLEMENT/ARTIFICIAL SALT) (HOLD DOSE FOR SYSTOLIC BLOOD PRESSURE LESS THAN 100; AVOID POTASSIUM  SUPPLEMENTS/ARTIFICIAL SALT)     potassium chloride SA (KLOR-CON M20) 20 MEQ tablet TAKE 1 TABLET BY MOUTH ONCE DAILY ON MONDAY, WEDNESDAY, AND FRIDAY. TAKE WITH LASIX 30 tablet 0   prochlorperazine (COMPAZINE) 10 MG tablet Take 1 tablet (10 mg total) by mouth every 6 (six) hours as needed for nausea or vomiting. 30 tablet 2   sennosides-docusate sodium (SENOKOT-S) 8.6-50 MG tablet Take 2 tablets by mouth daily as needed for constipation.     No current facility-administered medications for this visit.    PHYSICAL EXAMINATION: ECOG PERFORMANCE STATUS: 0 - Asymptomatic  Vitals:   04/15/22 1136  BP: (!) 154/85  Pulse: 84  Resp: 15  Temp: 98 F (36.7 C)  SpO2: 100%   Wt Readings from Last 3 Encounters:  04/15/22 190 lb 4.8 oz (86.3 kg)  04/02/22 193 lb 14.4 oz (88 kg)  03/01/22 192 lb 8 oz (87.3 kg)     GENERAL:alert, no distress and comfortable SKIN: skin color normal, no rashes or significant lesions EYES: normal, Conjunctiva are pink and non-injected,  sclera clear  NEURO: alert & oriented x 3 with fluent speech  LABORATORY DATA:  I have reviewed the data as listed    Latest Ref Rng & Units 04/15/2022   11:21 AM 04/02/2022    8:38 AM 03/01/2022   10:10 AM  CBC  WBC 4.0 - 10.5 K/uL 6.5  6.3  5.5   Hemoglobin 12.0 - 15.0 g/dL 11.0  10.1  10.5   Hematocrit 36.0 - 46.0 % 31.5  29.0  29.7   Platelets 150 - 400 K/uL 251  225  245         Latest Ref Rng & Units 04/15/2022   11:21 AM 04/02/2022    8:38 AM 03/01/2022   10:10 AM  CMP  Glucose 70 - 99 mg/dL 96  99  110   BUN 8 - 23 mg/dL '16  18  19   ' Creatinine 0.44 - 1.00 mg/dL 1.10  1.08  1.07   Sodium 135 - 145 mmol/L 141  138  140   Potassium 3.5 - 5.1 mmol/L 3.8  3.5  3.7   Chloride 98 - 111 mmol/L 107  106  106   CO2 22 - 32 mmol/L '25  24  27   ' Calcium 8.9 - 10.3 mg/dL 9.5  9.4  9.5   Total Protein 6.5 - 8.1 g/dL 7.6  7.8  7.9   Total Bilirubin 0.3 - 1.2 mg/dL 0.6  0.4  0.3   Alkaline Phos 38 - 126 U/L 90  94  95    AST 15 - 41 U/L '18  17  15   ' ALT 0 - 44 U/L '10  10  8       ' RADIOGRAPHIC STUDIES: I have personally reviewed the radiological images as listed and agreed with the findings in the report. No results found.    No orders of the defined types were placed in this encounter.  All questions were answered. The patient knows to call the clinic with any problems, questions or concerns. No barriers to learning was detected. The total time spent in the appointment was 30 minutes.     [name], MD 04/15/2022   I, Wilburn Mylar, am acting as scribe for [name], MD.   I have reviewed the above documentation for accuracy and completeness, and I agree with the above.

## 2022-04-16 ENCOUNTER — Other Ambulatory Visit: Payer: Self-pay

## 2022-04-16 LAB — CA 125: Cancer Antigen (CA) 125: 19.7 U/mL (ref 0.0–38.1)

## 2022-04-21 ENCOUNTER — Other Ambulatory Visit: Payer: Self-pay

## 2022-04-25 ENCOUNTER — Emergency Department (HOSPITAL_COMMUNITY): Payer: Medicare Other

## 2022-04-25 ENCOUNTER — Other Ambulatory Visit: Payer: Self-pay

## 2022-04-25 ENCOUNTER — Inpatient Hospital Stay (HOSPITAL_BASED_OUTPATIENT_CLINIC_OR_DEPARTMENT_OTHER): Payer: Medicare Other | Admitting: Hematology

## 2022-04-25 ENCOUNTER — Encounter (HOSPITAL_COMMUNITY): Payer: Self-pay

## 2022-04-25 ENCOUNTER — Inpatient Hospital Stay: Payer: Medicare Other | Attending: Hematology

## 2022-04-25 ENCOUNTER — Inpatient Hospital Stay (HOSPITAL_COMMUNITY): Payer: Medicare Other

## 2022-04-25 ENCOUNTER — Inpatient Hospital Stay (HOSPITAL_COMMUNITY)
Admission: EM | Admit: 2022-04-25 | Discharge: 2022-05-03 | DRG: 375 | Disposition: A | Discharge status: Home Health | Payer: Medicare Other | Attending: Internal Medicine | Admitting: Internal Medicine

## 2022-04-25 ENCOUNTER — Encounter: Payer: Self-pay | Admitting: Hematology

## 2022-04-25 ENCOUNTER — Telehealth: Payer: Self-pay

## 2022-04-25 ENCOUNTER — Ambulatory Visit (HOSPITAL_COMMUNITY)
Admission: RE | Admit: 2022-04-25 | Discharge: 2022-04-25 | Disposition: A | Discharge status: Home / Self Care | Payer: Medicare Other | Source: Ambulatory Visit | Attending: Hematology | Admitting: Hematology

## 2022-04-25 ENCOUNTER — Other Ambulatory Visit (HOSPITAL_COMMUNITY): Payer: Self-pay

## 2022-04-25 ENCOUNTER — Other Ambulatory Visit: Payer: Self-pay | Admitting: Hematology

## 2022-04-25 VITALS — BP 160/96 | HR 91 | Temp 97.9°F | Resp 18 | Wt 184.2 lb

## 2022-04-25 DIAGNOSIS — Z79899 Other long term (current) drug therapy: Secondary | ICD-10-CM | POA: Insufficient documentation

## 2022-04-25 DIAGNOSIS — G620 Drug-induced polyneuropathy: Secondary | ICD-10-CM | POA: Insufficient documentation

## 2022-04-25 DIAGNOSIS — D72829 Elevated white blood cell count, unspecified: Secondary | ICD-10-CM | POA: Diagnosis not present

## 2022-04-25 DIAGNOSIS — I130 Hypertensive heart and chronic kidney disease with heart failure and stage 1 through stage 4 chronic kidney disease, or unspecified chronic kidney disease: Secondary | ICD-10-CM | POA: Diagnosis present

## 2022-04-25 DIAGNOSIS — E876 Hypokalemia: Secondary | ICD-10-CM | POA: Diagnosis present

## 2022-04-25 DIAGNOSIS — Z7901 Long term (current) use of anticoagulants: Secondary | ICD-10-CM | POA: Diagnosis not present

## 2022-04-25 DIAGNOSIS — T451X5A Adverse effect of antineoplastic and immunosuppressive drugs, initial encounter: Secondary | ICD-10-CM | POA: Insufficient documentation

## 2022-04-25 DIAGNOSIS — Z90711 Acquired absence of uterus with remaining cervical stump: Secondary | ICD-10-CM | POA: Diagnosis not present

## 2022-04-25 DIAGNOSIS — C786 Secondary malignant neoplasm of retroperitoneum and peritoneum: Secondary | ICD-10-CM | POA: Insufficient documentation

## 2022-04-25 DIAGNOSIS — C269 Malignant neoplasm of ill-defined sites within the digestive system: Secondary | ICD-10-CM | POA: Insufficient documentation

## 2022-04-25 DIAGNOSIS — Z86718 Personal history of other venous thrombosis and embolism: Secondary | ICD-10-CM

## 2022-04-25 DIAGNOSIS — R112 Nausea with vomiting, unspecified: Secondary | ICD-10-CM | POA: Insufficient documentation

## 2022-04-25 DIAGNOSIS — D630 Anemia in neoplastic disease: Secondary | ICD-10-CM | POA: Diagnosis present

## 2022-04-25 DIAGNOSIS — Z95828 Presence of other vascular implants and grafts: Secondary | ICD-10-CM

## 2022-04-25 DIAGNOSIS — K56609 Unspecified intestinal obstruction, unspecified as to partial versus complete obstruction: Secondary | ICD-10-CM | POA: Diagnosis present

## 2022-04-25 DIAGNOSIS — D490 Neoplasm of unspecified behavior of digestive system: Secondary | ICD-10-CM | POA: Diagnosis not present

## 2022-04-25 DIAGNOSIS — Z9221 Personal history of antineoplastic chemotherapy: Secondary | ICD-10-CM | POA: Diagnosis not present

## 2022-04-25 DIAGNOSIS — D631 Anemia in chronic kidney disease: Secondary | ICD-10-CM | POA: Diagnosis present

## 2022-04-25 DIAGNOSIS — R188 Other ascites: Secondary | ICD-10-CM | POA: Diagnosis present

## 2022-04-25 DIAGNOSIS — R109 Unspecified abdominal pain: Secondary | ICD-10-CM | POA: Insufficient documentation

## 2022-04-25 DIAGNOSIS — Z86711 Personal history of pulmonary embolism: Secondary | ICD-10-CM | POA: Diagnosis not present

## 2022-04-25 DIAGNOSIS — D539 Nutritional anemia, unspecified: Secondary | ICD-10-CM | POA: Diagnosis present

## 2022-04-25 DIAGNOSIS — G629 Polyneuropathy, unspecified: Secondary | ICD-10-CM | POA: Diagnosis present

## 2022-04-25 DIAGNOSIS — I700 Atherosclerosis of aorta: Secondary | ICD-10-CM | POA: Insufficient documentation

## 2022-04-25 DIAGNOSIS — K219 Gastro-esophageal reflux disease without esophagitis: Secondary | ICD-10-CM | POA: Diagnosis present

## 2022-04-25 DIAGNOSIS — Z5111 Encounter for antineoplastic chemotherapy: Secondary | ICD-10-CM | POA: Insufficient documentation

## 2022-04-25 DIAGNOSIS — R1110 Vomiting, unspecified: Secondary | ICD-10-CM | POA: Insufficient documentation

## 2022-04-25 DIAGNOSIS — N182 Chronic kidney disease, stage 2 (mild): Secondary | ICD-10-CM | POA: Diagnosis present

## 2022-04-25 DIAGNOSIS — I10 Essential (primary) hypertension: Secondary | ICD-10-CM | POA: Diagnosis present

## 2022-04-25 DIAGNOSIS — I5032 Chronic diastolic (congestive) heart failure: Secondary | ICD-10-CM | POA: Diagnosis present

## 2022-04-25 DIAGNOSIS — C482 Malignant neoplasm of peritoneum, unspecified: Secondary | ICD-10-CM

## 2022-04-25 DIAGNOSIS — C801 Malignant (primary) neoplasm, unspecified: Secondary | ICD-10-CM | POA: Diagnosis present

## 2022-04-25 DIAGNOSIS — Z5189 Encounter for other specified aftercare: Secondary | ICD-10-CM | POA: Insufficient documentation

## 2022-04-25 LAB — CBC WITH DIFFERENTIAL/PLATELET
Abs Immature Granulocytes: 0.09 10*3/uL — ABNORMAL HIGH (ref 0.00–0.07)
Basophils Absolute: 0 10*3/uL (ref 0.0–0.1)
Basophils Relative: 0 %
Eosinophils Absolute: 0 10*3/uL (ref 0.0–0.5)
Eosinophils Relative: 1 %
HCT: 33.6 % — ABNORMAL LOW (ref 36.0–46.0)
Hemoglobin: 11.9 g/dL — ABNORMAL LOW (ref 12.0–15.0)
Immature Granulocytes: 1 %
Lymphocytes Relative: 18 %
Lymphs Abs: 1.3 10*3/uL (ref 0.7–4.0)
MCH: 38.8 pg — ABNORMAL HIGH (ref 26.0–34.0)
MCHC: 35.4 g/dL (ref 30.0–36.0)
MCV: 109.4 fL — ABNORMAL HIGH (ref 80.0–100.0)
Monocytes Absolute: 0.6 10*3/uL (ref 0.1–1.0)
Monocytes Relative: 8 %
Neutro Abs: 5.5 10*3/uL (ref 1.7–7.7)
Neutrophils Relative %: 72 %
Platelets: 291 10*3/uL (ref 150–400)
RBC: 3.07 MIL/uL — ABNORMAL LOW (ref 3.87–5.11)
RDW: 14.3 % (ref 11.5–15.5)
WBC: 7.6 10*3/uL (ref 4.0–10.5)
nRBC: 0 % (ref 0.0–0.2)

## 2022-04-25 LAB — CMP (CANCER CENTER ONLY)
ALT: 9 U/L (ref 0–44)
AST: 19 U/L (ref 15–41)
Albumin: 4.4 g/dL (ref 3.5–5.0)
Alkaline Phosphatase: 91 U/L (ref 38–126)
Anion gap: 11 (ref 5–15)
BUN: 16 mg/dL (ref 8–23)
CO2: 26 mmol/L (ref 22–32)
Calcium: 9.7 mg/dL (ref 8.9–10.3)
Chloride: 99 mmol/L (ref 98–111)
Creatinine: 1.19 mg/dL — ABNORMAL HIGH (ref 0.44–1.00)
GFR, Estimated: 49 mL/min — ABNORMAL LOW (ref >60)
Glucose, Bld: 127 mg/dL — ABNORMAL HIGH (ref 70–99)
Potassium: 3.5 mmol/L (ref 3.5–5.1)
Sodium: 136 mmol/L (ref 135–145)
Total Bilirubin: 0.8 mg/dL (ref 0.3–1.2)
Total Protein: 8.4 g/dL — ABNORMAL HIGH (ref 6.5–8.1)

## 2022-04-25 LAB — CBC WITH DIFFERENTIAL (CANCER CENTER ONLY)
Abs Immature Granulocytes: 0.08 10*3/uL — ABNORMAL HIGH (ref 0.00–0.07)
Basophils Absolute: 0 10*3/uL (ref 0.0–0.1)
Basophils Relative: 0 %
Eosinophils Absolute: 0.1 10*3/uL (ref 0.0–0.5)
Eosinophils Relative: 1 %
HCT: 34.2 % — ABNORMAL LOW (ref 36.0–46.0)
Hemoglobin: 12.4 g/dL (ref 12.0–15.0)
Immature Granulocytes: 1 %
Lymphocytes Relative: 17 %
Lymphs Abs: 1.4 10*3/uL (ref 0.7–4.0)
MCH: 38.6 pg — ABNORMAL HIGH (ref 26.0–34.0)
MCHC: 36.3 g/dL — ABNORMAL HIGH (ref 30.0–36.0)
MCV: 106.5 fL — ABNORMAL HIGH (ref 80.0–100.0)
Monocytes Absolute: 0.7 10*3/uL (ref 0.1–1.0)
Monocytes Relative: 9 %
Neutro Abs: 5.8 10*3/uL (ref 1.7–7.7)
Neutrophils Relative %: 72 %
Platelet Count: 338 10*3/uL (ref 150–400)
RBC: 3.21 MIL/uL — ABNORMAL LOW (ref 3.87–5.11)
RDW: 13.9 % (ref 11.5–15.5)
WBC Count: 8.1 10*3/uL (ref 4.0–10.5)
nRBC: 0 % (ref 0.0–0.2)

## 2022-04-25 LAB — COMPREHENSIVE METABOLIC PANEL
ALT: 12 U/L (ref 0–44)
AST: 17 U/L (ref 15–41)
Albumin: 3.8 g/dL (ref 3.5–5.0)
Alkaline Phosphatase: 76 U/L (ref 38–126)
Anion gap: 12 (ref 5–15)
BUN: 15 mg/dL (ref 8–23)
CO2: 24 mmol/L (ref 22–32)
Calcium: 9.4 mg/dL (ref 8.9–10.3)
Chloride: 102 mmol/L (ref 98–111)
Creatinine, Ser: 1.14 mg/dL — ABNORMAL HIGH (ref 0.44–1.00)
GFR, Estimated: 52 mL/min — ABNORMAL LOW (ref >60)
Glucose, Bld: 118 mg/dL — ABNORMAL HIGH (ref 70–99)
Potassium: 3.2 mmol/L — ABNORMAL LOW (ref 3.5–5.1)
Sodium: 138 mmol/L (ref 135–145)
Total Bilirubin: 1 mg/dL (ref 0.3–1.2)
Total Protein: 8 g/dL (ref 6.5–8.1)

## 2022-04-25 LAB — LIPASE, BLOOD: Lipase: 34 U/L (ref 11–51)

## 2022-04-25 LAB — PHOSPHORUS: Phosphorus: 3.1 mg/dL (ref 2.5–4.6)

## 2022-04-25 LAB — MAGNESIUM: Magnesium: 1.9 mg/dL (ref 1.7–2.4)

## 2022-04-25 LAB — CK: Total CK: 61 U/L (ref 38–234)

## 2022-04-25 MED ORDER — SODIUM CHLORIDE 0.9% FLUSH
10.0000 mL | Freq: Once | INTRAVENOUS | Status: AC
  Administered 2022-04-25: 10 mL

## 2022-04-25 MED ORDER — IOHEXOL 300 MG/ML  SOLN
100.0000 mL | Freq: Once | INTRAMUSCULAR | Status: AC | PRN
  Administered 2022-04-25: 100 mL via INTRAVENOUS

## 2022-04-25 MED ORDER — HYDROMORPHONE HCL 1 MG/ML IJ SOLN
0.5000 mg | INTRAMUSCULAR | Status: DC | PRN
  Administered 2022-04-27 – 2022-04-28 (×2): 0.5 mg via INTRAVENOUS
  Filled 2022-04-25 (×3): qty 0.5

## 2022-04-25 MED ORDER — ACETAMINOPHEN 325 MG PO TABS: 650.0000 mg | ORAL_TABLET | Freq: Four times a day (QID) | ORAL | Status: DC | PRN

## 2022-04-25 MED ORDER — HYDROCODONE-ACETAMINOPHEN 5-325 MG PO TABS: 1.0000 | ORAL_TABLET | ORAL | Status: DC | PRN

## 2022-04-25 MED ORDER — HYDROMORPHONE HCL 1 MG/ML IJ SOLN
0.5000 mg | Freq: Once | INTRAMUSCULAR | Status: AC
  Administered 2022-04-25: 0.5 mg via INTRAVENOUS
  Filled 2022-04-25: qty 1

## 2022-04-25 MED ORDER — ONDANSETRON HCL 4 MG/2ML IJ SOLN
4.0000 mg | Freq: Once | INTRAMUSCULAR | Status: AC
  Administered 2022-04-25: 4 mg via INTRAVENOUS
  Filled 2022-04-25: qty 2

## 2022-04-25 MED ORDER — HEPARIN SOD (PORK) LOCK FLUSH 100 UNIT/ML IV SOLN
500.0000 [IU] | Freq: Once | INTRAVENOUS | Status: AC
  Administered 2022-04-25: 500 [IU]

## 2022-04-25 MED ORDER — ACETAMINOPHEN 650 MG RE SUPP: 650.0000 mg | Freq: Four times a day (QID) | RECTAL | Status: DC | PRN

## 2022-04-25 MED ORDER — SODIUM CHLORIDE 0.9 % IV SOLN
INTRAVENOUS | Status: AC
Start: 2022-04-25 — End: 2022-04-26

## 2022-04-25 MED ORDER — POTASSIUM CHLORIDE 10 MEQ/100ML IV SOLN
10.0000 meq | INTRAVENOUS | Status: AC
  Administered 2022-04-25 – 2022-04-26 (×4): 10 meq via INTRAVENOUS
  Filled 2022-04-25 (×2): qty 100

## 2022-04-25 MED ORDER — SODIUM CHLORIDE 0.9 % IV BOLUS
1000.0000 mL | Freq: Once | INTRAVENOUS | Status: AC
  Administered 2022-04-25: 1000 mL via INTRAVENOUS

## 2022-04-25 NOTE — H&P (Signed)
Brenda Stewart NLG:[id number] DOB: 1951/05/13 DOA: 04/25/2022     PCP: Merryl Hacker, No   Outpatient Specialists:      Oncology   Dr. Burr Medico GI Dr. Collene Mares    Patient arrived to ER on 04/25/22 at 1454 Referred by Attending Cardama, Grayce Sessions, *   Patient coming from:    home Lives w family    Chief Complaint:   Chief Complaint  Patient presents with   Abdominal Pain    HPI: Brenda Stewart is a 71 y.o. 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 Burr Medico now on Xeloda presents with 3 days of abd pain and vomiting Pain 8/10 sharp, decreased appetite, last BM 04/24/2022 Ws seen by Dr. Burr Medico and diagnosed with SBO based on CT Was sent to ER Could not tolerate NG tube She drinks every day 1-2 drinks 7 drinks per week drinks brandy  Denies etoh withdrawal No tobacco  Last dose of eliquis was yesterday Reports dysuria States patient had a small bowel obstruction about a year ago and was unable to tolerate NG tube at that time as well her symptoms have resolved with conservative management  Regarding pertinent Chronic problems:       HTN on  has not been taking Lisinopril, Norvasc,   Carcinomatosis  - Xeloda   chronic CHF diastolic - last echo August 0814 grade 1 diastolic CHF  EF 60- 48%  On lasix MWF     Hx of DVT/PE on - anticoagulation with  Eliquis,    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  Lab Results  Component Value Date   CREATININE 1.14 (H) 04/25/2022   CREATININE 1.19 (H) 04/25/2022   CREATININE 1.10 (H) 04/15/2022    Chronic anemia - baseline hg Hemoglobin & Hematocrit  Recent Labs    04/15/22 1121 04/25/22 1314 04/25/22 1604  HGB 11.0* 12.4 11.9*     While in ER:   CT with SBO Pain  treated w dilaudid Given Zofran NG attempted IV fluids given      CTabd/pelvis - Moderate free fluid throughout the abdomen and pelvis with peritoneal wall thickening and implant suggesting peritoneal carcinomatosis, progressing since prior study. 2. Mildly dilated small bowel with wall thickening and fecalization of contents. Decompressed terminal ileum with transition zone in the right lower quadrant. Appearance is consistent with small-bowel obstruction.   Following Medications were ordered in ER: Medications  sodium chloride 0.9 % bolus 1,000 mL (0 mLs Intravenous Stopped 04/25/22 1754)  ondansetron (ZOFRAN) injection 4 mg (4 mg Intravenous Given 04/25/22 1631)  iohexol (OMNIPAQUE) 300 MG/ML solution 100 mL (100 mLs Intravenous Contrast Given 04/25/22 1704)  HYDROmorphone (DILAUDID) injection 0.5 mg (0.5 mg Intravenous Given 04/25/22 1753)    _______________________________________________________ ER Provider Called: General Surgery    Dr. Zenia Resides  They Recommend admit to medicine   Will see in AM      ED Triage Vitals  Enc Vitals Group     BP 04/25/22 1502 (!) 152/95     Pulse Rate 04/25/22 1502 (!) 101     Resp 04/25/22 1502 18     Temp 04/25/22 1502 97.8 F (36.6 C)     Temp Source 04/25/22 1502 Oral  SpO2 04/25/22 1502 96 %     Weight 04/25/22 1500 184 lb (83.5 kg)     Height 04/25/22 1500 '5\' 7"'$  (1.702 m)     Head Circumference --      Peak Flow --      Pain Score 04/25/22 1500 8     Pain Loc --      Pain Edu? --      Excl. in [place]? --   TMAX(24)@     _________________________________________ Significant initial  Findings: Abnormal Labs Reviewed  CBC WITH DIFFERENTIAL/PLATELET - Abnormal; Notable for the following components:      Result Value   RBC 3.07 (*)    Hemoglobin 11.9 (*)    HCT 33.6 (*)    MCV 109.4 (*)    MCH 38.8 (*)    Abs Immature Granulocytes 0.09 (*)    All other components within normal limits  COMPREHENSIVE METABOLIC PANEL - Abnormal; Notable  for the following components:   Potassium 3.2 (*)    Glucose, Bld 118 (*)    Creatinine, Ser 1.14 (*)    GFR, Estimated 52 (*)    All other components within normal limits     ECG: Ordered Personally reviewed and interpreted by me showing: HR : 79 Rhythm:  NSR,   no evidence of ischemic changes QTC 436    The recent clinical data is shown below. Vitals:   04/25/22 1630 04/25/22 1700 04/25/22 1730 04/25/22 1800  BP: (!) 143/87 138/88 (!) 143/85 138/79  Pulse: 76 80 80 83  Resp: 18 20 (!) 21 17  Temp:      TempSrc:      SpO2: 97% 100% 98% 98%  Weight:      Height:          WBC     Component Value Date/Time   WBC 7.6 04/25/2022 1604   WBC 8.1 04/25/2022 1314   LYMPHSABS 1.3 04/25/2022 1604   MONOABS 0.6 04/25/2022 1604   EOSABS 0.0 04/25/2022 1604   BASOSABS 0.0 04/25/2022 1604        UA  ordered      _______________________________________________ Hospitalist was called for admission for   SBO (small bowel obstruction) ([place])     The following Work up has been ordered so far:  Orders Placed This Encounter  Procedures   CT ABDOMEN PELVIS W CONTRAST   CBC with Differential   Comprehensive metabolic panel   Lipase, blood   Urinalysis, Routine w reflex microscopic   Insert NG/OG (Gastric Tube)   Consult to hospitalist   EKG 12-Lead   Insert peripheral IV     OTHER Significant initial  Findings:  labs showing:    Recent Labs  Lab 04/25/22 1314 04/25/22 1604  NA 136 138  K 3.5 3.2*  CO2 26 24  GLUCOSE 127* 118*  BUN 16 15  CREATININE 1.19* 1.14*  CALCIUM 9.7 9.4    Cr  stable,  Lab Results  Component Value Date   CREATININE 1.14 (H) 04/25/2022   CREATININE 1.19 (H) 04/25/2022   CREATININE 1.10 (H) 04/15/2022    Recent Labs  Lab 04/25/22 1314 04/25/22 1604  AST 19 17  ALT 9 12  ALKPHOS 91 76  BILITOT 0.8 1.0  PROT 8.4* 8.0  ALBUMIN 4.4 3.8   Lab Results  Component Value Date   CALCIUM 9.4 04/25/2022   PHOS 3.5 05/07/2021           Plt: Lab Results  Component Value Date  PLT 291 04/25/2022       Recent Labs  Lab 04/25/22 1314 04/25/22 1604  WBC 8.1 7.6  NEUTROABS 5.8 5.5  HGB 12.4 11.9*  HCT 34.2* 33.6*  MCV 106.5* 109.4*  PLT 338 291    HG/HCT  stable,     Component Value Date/Time   HGB 11.9 (L) 04/25/2022 1604   HGB 12.4 04/25/2022 1314   HCT 33.6 (L) 04/25/2022 1604   MCV 109.4 (H) 04/25/2022 1604    Recent Labs  Lab 04/25/22 1604  LIPASE 34    DM  labs:  HbA1C: Recent Labs    05/05/21 0409  HGBA1C 6.4*       CBG (last 3)  No results for input(s): "GLUCAP" in the last 72 hours.        Cultures:    Component Value Date/Time   SDES PERITONEAL FLUID 04/24/2021 1355   SPECREQUEST ABDOMEN 04/24/2021 1355   CULT  04/24/2021 1355    NO GROWTH 3 DAYS Performed at [facility], [place] [address], [place], [place] [address]    REPTSTATUS 04/27/2021 FINAL 04/24/2021 1355     Radiological Exams on Admission: CT ABDOMEN PELVIS W CONTRAST  Result Date: 04/25/2022 CLINICAL DATA:  Bowel obstruction suspected. Vomiting. Patient presents from the cancer center. EXAM: CT ABDOMEN AND PELVIS WITH CONTRAST TECHNIQUE: Multidetector CT imaging of the abdomen and pelvis was performed using the standard protocol following bolus administration of intravenous contrast. RADIATION DOSE REDUCTION: This exam was performed according to the departmental dose-optimization program which includes automated exposure control, adjustment of the mA and/or kV according to patient size and/or use of iterative reconstruction technique. CONTRAST:  168m OMNIPAQUE IOHEXOL 300 MG/ML  SOLN COMPARISON:  01/08/2022 FINDINGS: Lower chest: Mild dependent atelectasis in the lung bases. Small esophageal hiatal hernia. Hepatobiliary: No focal liver abnormality is seen. No gallstones, gallbladder wall thickening, or biliary dilatation. Pancreas: Unremarkable. No pancreatic ductal dilatation or surrounding inflammatory  changes. Spleen: Normal in size without focal abnormality. Adrenals/Urinary Tract: Adrenal glands are unremarkable. Kidneys are normal, without renal calculi, focal lesion, or hydronephrosis. Bladder is unremarkable. Stomach/Bowel: Mild distention of small-bowel loops with gas and fluid. Mild small bowel wall thickening is suggested. Fecalization of small bowel contents. Decompression of the terminal ileum with transition zone suggested in the right lower quadrant. Decompressed stool-filled colon. Appendix is not identified. Vascular/Lymphatic: Aortic atherosclerosis. No enlarged abdominal or pelvic lymph nodes. Mesenteric vessels appear patent. Reproductive: Status post hysterectomy. No adnexal masses. Other: Moderate diffuse free fluid throughout the abdomen and pelvis. Nodular stranding in the mesentery and omentum. Peritoneal wall thickening. Changes are consistent with peritoneal carcinomatosis. Appearances have progressed since the previous study. No free air. Abdominal wall musculature appears intact. Musculoskeletal: Degenerative changes.  No acute bony abnormalities. IMPRESSION: 1. Moderate free fluid throughout the abdomen and pelvis with peritoneal wall thickening and implant suggesting peritoneal carcinomatosis, progressing since prior study. 2. Mildly dilated small bowel with wall thickening and fecalization of contents. Decompressed terminal ileum with transition zone in the right lower quadrant. Appearance is consistent with small-bowel obstruction. 3. Small esophageal hiatal hernia. 4. Aortic atherosclerosis. Electronically Signed   By: WLucienne CapersM.D.   On: 04/25/2022 17:23   DG Abd 2 Views  Result Date: 04/25/2022 CLINICAL DATA:  recurrent abdominal pain and nausea, rule out bowel obstruction EXAM: ABDOMEN - 2 VIEW COMPARISON:  CT 01/08/2022 FINDINGS: There are multiple mildly dilated loops of small bowel with air-fluid level. Mild-to-moderate stool burden. No radiopaque calculi overlie  the kidneys. IMPRESSION: Multiple mildly dilated loops of small bowel with air-fluid levels. Findings could potentially represent a small-bowel obstruction. Consider CT. These results will be called to the ordering clinician or representative by the Radiologist Assistant, and communication documented in the PACS or [employer]. Electronically Signed   By: Maurine Simmering M.D.   On: 04/25/2022 13:15   _______________________________________________________________________________________________________ Latest  Blood pressure 138/79, pulse 83, temperature 97.8 F (36.6 C), temperature source Oral, resp. rate 17, height '5\' 7"'$  (1.702 m), weight 83.5 kg, SpO2 98 %.   Vitals  labs and radiology finding personally reviewed  Review of Systems:    Pertinent positives include:  , abdominal pain, nausea, vomiting  Constitutional:  No weight loss, night sweats, Fevers, chills, fatigue, weight loss  HEENT:  No headaches, Difficulty swallowing,Tooth/dental problems,Sore throat,  No sneezing, itching, ear ache, nasal congestion, post nasal drip,  Cardio-vascular:  No chest pain, Orthopnea, PND, anasarca, dizziness, palpitations.no Bilateral lower extremity swelling  GI:  No heartburn, indigestion, diarrhea, change in bowel habits, loss of appetite, melena, blood in stool, hematemesis Resp:  no shortness of breath at rest. No dyspnea on exertion, No excess mucus, no productive cough, No non-productive cough, No coughing up of blood.No change in color of mucus.No wheezing. Skin:  no rash or lesions. No jaundice GU:  no dysuria, change in color of urine, no urgency or frequency. No straining to urinate.  No flank pain.  Musculoskeletal:  No joint pain or no joint swelling. No decreased range of motion. No back pain.  Psych:  No change in mood or affect. No depression or anxiety. No memory loss.  Neuro: no localizing neurological complaints, no tingling, no weakness, no double vision, no gait  abnormality, no slurred speech, no confusion  All systems reviewed and apart from [place] all are negative _______________________________________________________________________________________________ Past Medical History:   Past Medical History:  Diagnosis Date   Acid reflux    Hypertension       Past Surgical History:  Procedure Laterality Date   BIOPSY  05/07/2021   Procedure: BIOPSY;  Surgeon: [name], MD;  Location: [facility];  Service: Endoscopy;;   BUNIONECTOMY Bilateral    ESOPHAGEAL BRUSHING  05/07/2021   Procedure: ESOPHAGEAL BRUSHING;  Surgeon: [name], MD;  Location: [facility];  Service: Endoscopy;;   ESOPHAGOGASTRODUODENOSCOPY (EGD) WITH PROPOFOL N/A 05/07/2021   Procedure: ESOPHAGOGASTRODUODENOSCOPY (EGD) WITH PROPOFOL;  Surgeon: [name], MD;  Location: [facility];  Service: Endoscopy;  Laterality: N/A;   HAND TENDON SURGERY Bilateral    IR IMAGING GUIDED PORT INSERTION  04/26/2021   IR PARACENTESIS  04/24/2021   LAPAROSCOPY N/A 04/30/2021   Procedure: LAPAROSCOPY DIAGNOSTIC WITH BIOPSIES;  Surgeon: Lafonda Mosses, MD;  Location: [facility];  Service: Gynecology;  Laterality: N/A;   PARTIAL HYSTERECTOMY      Social History:  Ambulatory   independently       reports that she has never smoked. She has never used smokeless tobacco. She reports current alcohol use. She reports that she does not use drugs.     Family History:   History reviewed. No pertinent family history. ______________________________________________________________________________________________ Allergies: No Known Allergies   Prior to Admission medications   Medication Sig Start Date End Date Taking? Authorizing Provider  amLODipine (NORVASC) 10 MG tablet Take 5 mg by mouth daily. Take 1 tab by mouth daily (Hold Dose for Systolic BP Less than 482; Avoid Grapefruit or its juice) 08/20/21   [provider]  apixaban (ELIQUIS) 5 MG  TABS tablet Take 1 tablet (5 mg  total) by mouth 2 (two) times daily. 02/19/22   [name], MD  ascorbic acid (VITAMIN C) 250 MG CHEW Chew 500 mg by mouth daily.    [provider]  capecitabine (XELODA) 500 MG tablet Take 4 tablets every 12 hours,  for 14 days on, 7 days off. Repeat every 21 days. Take after a meal Patient taking differently: Take by mouth See admin instructions. Take 4 tablets every 12 hours,  for 14 days on, 7 days off. Repeat every 21 days. Take after a meal 04/10/22   [name], MD  Cholecalciferol (VITAMIN D3) 50 MCG (2000 UT) CHEW Chew 2 tablets by mouth daily.    [provider]  furosemide (LASIX) 20 MG tablet TAKE 1 TABLET BY MOUTH ONCE DAILY ON MONDAY, WEDNESDAY, AND FRIDAY. TAKE WITH POTASSIUM Patient taking differently: Take 20 mg by mouth See admin instructions. Take 1 tablet by mouth once daily on Monday, Wednesday, and Friday. Take with potassium 02/05/22   [name], MD  lidocaine-prilocaine (EMLA) cream Apply 1 application topically as needed. 06/04/21   [name], MD  lisinopril (ZESTRIL) 40 MG tablet Take 40 mg by mouth daily. TAKE 1 TAB BY MOUTH DAILY (HOLD DOSE FOR SYSTOLIC BLOOD PRESSURE LESS THAN 100; AVOID POTASSIUM SUPPLEMENT/ARTIFICIAL SALT) (HOLD DOSE FOR SYSTOLIC BLOOD PRESSURE LESS THAN 100; AVOID POTASSIUM SUPPLEMENTS/ARTIFICIAL SALT) 08/21/21   [provider]  potassium chloride SA (KLOR-CON M20) 20 MEQ tablet TAKE 1 TABLET BY MOUTH ONCE DAILY ON MONDAY, WEDNESDAY, AND FRIDAY. TAKE WITH LASIX Patient taking differently: Take 20 mEq by mouth See admin instructions. TAKE 1 TABLET BY MOUTH ONCE DAILY ON MONDAY, WEDNESDAY, AND FRIDAY. TAKE WITH LASIX 03/13/22   [name], MD  prochlorperazine (COMPAZINE) 10 MG tablet Take 1 tablet (10 mg total) by mouth every 6 (six) hours as needed for nausea or vomiting. 06/04/21   [name], MD  sennosides-docusate sodium (SENOKOT-S) 8.6-50 MG tablet Take 2 tablets by mouth daily as needed for constipation.    [provider]   gabapentin (NEURONTIN) 100 MG capsule Take 1 capsule (100 mg total) by mouth at bedtime. Patient not taking: Reported on 02/27/2021 10/16/16 04/30/21  Lysbeth Penner, FNP    ___________________________________________________________________________________________________ Physical Exam:    04/25/2022    6:00 PM 04/25/2022    5:30 PM 04/25/2022    5:00 PM  Vitals with BMI  Systolic [phone]  Diastolic 79 85 88  Pulse 83 80 80     1. General:  in No  Acute distress     Chronically ill   -appearing 2. Psychological: Alert and   Oriented 3. Head/ENT:    Dry Mucous Membranes                          Head Non traumatic, neck supple                            Poor Dentition 4. SKIN:  decreased Skin turgor,  Skin clean Dry and intact no rash 5. Heart: Regular rate and rhythm no  Murmur, no Rub or gallop 6. Lungs:  Clear to auscultation bilaterally, no wheezes or crackles   7. Abdomen: Soft,  non-tender, Non distended   obese  bowel sounds diminished 8. Lower extremities: no clubbing, cyanosis, no  edema 9. Neurologically Grossly intact, moving all 4 extremities equally  10. MSK: Normal range of motion    Chart has been reviewed  ______________________________________________________________________________________________  Assessment/Plan 71 y.o. female with medical history significant of Carcinomatosis, anemia, neuropathy, hx of PE on Eliquis, hx of SBO  Admitted for   SBO (small bowel obstruction)     Present on Admission:  Hypokalemia  SBO (small bowel obstruction) (H[facility])  Neoplasm of peritoneum  History of pulmonary embolus (PE)  Essential hypertension  Chronic diastolic CHF (congestive heart failure) (HCC)     Hypokalemia - will replace and repeat in AM,  check magnesium level and replace as needed   SBO (small bowel obstruction) ([place])  Likely cause  Malignancy   - admit for conservative management  - NG tube attempted pt could not tolerate - NPO - KUB in AM   - General surgery is aware    Neoplasm of peritoneum Followed by Dr. Burr Medico, will let them know pt has been admitted  History of pulmonary embolus (PE) Hold off on Eliquis for tonight , given SBO if needs to be off anticoagulation for prolonged time will switch to heparin   Essential hypertension Has been off her BP meds, BP running low will hold off for now  Chronic diastolic CHF (congestive heart failure) ([place]) - currently appears to be slightly on the dry side, hold home diuretics for tonight and restart when appears euvolemic, carefuly follow fluid status and Cr  Dysuria we will check UA  Other plan as per orders.  DVT prophylaxis:  SCD       Code Status:    Code Status: Prior FULL CODE  I had personally discussed CODE STATUS with patient      Family Communication:   Family not at  Bedside    Disposition Plan: To home once workup is complete and patient is stable   Following barriers for discharge:                            Electrolytes corrected                             Able to tolerate PO                            Will need consultants to evaluate patient prior to discharge                      Consults called: emailed oncology , general surgery is aware  Admission status:  ED Disposition     ED Disposition  Admit   Condition  --   [place]: [place] [[id number]]  Level of Care: Telemetry [5]  Admit to tele based on following criteria: Other see comments  Comments: hypokalemia  May admit patient to [facility] or [facility] if equivalent level of care is available:: No  Covid Evaluation: Asymptomatic - no recent exposure (last 10 days) testing not required  Diagnosis: SBO (small bowel obstruction) [facility]) [[id number]]  Admitting Physician: Toy Baker [[id number]]  Attending Physician: Toy Baker [[id number]]  Certification:: I certify this patient will need inpatient services for at least 2 midnights  Estimated Length  of Stay: 2           inpatient     I Expect 2 midnight stay secondary to severity of patient's current illness need for inpatient interventions justified  by the following:   Severe lab/radiological/exam abnormalities including:     and extensive comorbidities including:  CHF    malignancy,   Chronic anticoagulation  That are currently affecting medical management.   I expect  patient to be hospitalized for 2 midnights requiring inpatient medical care.  Patient is at high risk for adverse outcome (such as loss of life or disability) if not treated.  Indication for inpatient stay as follows:    severe pain requiring acute inpatient management,  inability to maintain oral hydration    Need for operative/procedural  intervention     Need for IV fluids, IV pain medications,     Level of care     tele  For 12H     Lab Results  Component Value Date   [place] NEGATIVE 04/21/2021     Terrin Imparato 04/25/2022, 8:00 PM    [facility]     after 2 AM please page floor coverage PA If 7AM-7PM, please contact the day team taking care of the patient using [email or web address]   Patient was evaluated in the context of the global COVID-19 pandemic, which necessitated consideration that the patient might be at risk for infection with the SARS-CoV-2 virus that causes COVID-19. Institutional protocols and algorithms that pertain to the evaluation of patients at risk for COVID-19 are in a state of rapid change based on information released by regulatory bodies including the CDC and federal and state organizations. These policies and algorithms were followed during the patient's care.

## 2022-04-25 NOTE — Subjective & Objective (Signed)
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 Burr Medico now on Xeloda presents with 3 days of abd pain and vomiting Pain 8/10 sharp, decreased appetite, last BM 04/24/2022 Ws seen by Dr. Burr Medico and diagnosed with SBO based on CT Was sent to ER Could not tolerate NG tube

## 2022-04-25 NOTE — Telephone Encounter (Signed)
Spoke with pt via telephone regarding symptoms.  Pt stated she's having stomach pains for the past 3 days with some episodes of vomiting.  Pt stated pain is sharp but gradually goes away and rates pain at 8 out 10.  Pt stated she's had 2 episodes of vomiting but resolved when she took Compazine.  Pt denied nausea.  Pt stated she's not eating much d/t loss of appetite.  Pt's last BM was on 04/24/2022 which was liquid d/t pt took stool softeners 2 days prior.  Pt denies taking anything for pain and denies gas.  Pt does have some acid reflux at the time of vomiting episodes.  Pt denies taking anything for acid reflux but stated she does have Omeprazole '20mg'$  at home for acid reflux.  Instructed pt to start taking the Omeprazole.  Spoke with Dr. Burr Medico regarding pt's symptoms and Dr. Burr Medico would like for the pt to come into clinic for further assessment.  Pt is scheduled for CXR, Lab, and Dr. Burr Medico.  Dr. Burr Medico also ordered a STAT CT Scan and suspect possible disease progression.  CT Scan authorized and send Scheduling message to Central Scheduling to get CT Scan scheduled if possible tomorrow 04/26/2022.  Informed pt of the plan and pt is coming in today for CXR and to see Dr. Burr Medico.

## 2022-04-25 NOTE — Assessment & Plan Note (Signed)
-   currently appears to be slightly on the dry side, hold home diuretics for tonight and restart when appears euvolemic, carefuly follow fluid status and Cr  

## 2022-04-25 NOTE — Progress Notes (Signed)
[place]   [phone] [phone]   Clinic Follow up Note   Patient Care Team: Pcp, No as PCP - General [name], MD as Consulting Physician (Hematology)  Date of Service:  04/25/2022  CHIEF COMPLAINT: new stomach pain, f/u of peritoneal carcinomatosis  CURRENT THERAPY:  Xeloda 2000 mg BID days 1-14 q. 21 days, starting 10/29/21  ASSESSMENT & PLAN:  Brenda Stewart is a 71 y.o. female with   1. Stomach pain, Vomiting, SBO  -she reports new stomach pain and vomiting that developed in the last 3 days. -abdomen x-ray today is concerning for small bowel obstruction. I recommend referral to ED, plan for admission. -This is likely related to her peritoneal cancer progression  2. 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 CT AP 04/21/21 showed findings suspicious for diffuse peritoneal carcinomatosis. She was admitted and underwent paracentesis on 04/24/21 showing atypical cells. -Diagnostic laparoscopy on 04/30/21 with pathology from peritoneum biopsy showing metastatic carcinoma with signet ring cell features. IHC supports upper GI primary.  -Her EGD was negative for primary malignancy. -her cancer is PD-L1 negative and MMR normal  -she completed 12 cycles of FOLFOX 05/04/21 - 10/10/21, tolerated very well overall but stopped due to neuropathy.  -She started maintenance Xeloda on 10/29/21, she has tolerated very well with only some hyperpigmentation, no other noticeable side effects. -restaging CT CAP on 01/08/22 showed NED. Plan to continue Xeloda.    3. 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 She still reports residual tingling in her fingertips and toes, her handwriting has changed -I recommend to begin B complex  vitamin. She does not want to take gabapentin again   4. H/o PE 04/2021 -incidental finding on staging chest CT on 05/04/21 -she was given heparin in the hospital and transitioned to Eliquis.     PLAN: -referral to ED for admission due to findings for bowel obstruction -I will f/u in hospital    No problem-specific Assessment & Plan notes found for this encounter.   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   Suspected distal small bowel obstruction, with probable transition point in right lower quadrant.   Aortic Atherosclerosis (ICD10-I70.0).   04/22/2021 Imaging   US Abdomen  IMPRESSION: Small volume of lower abdominopelvic ascites.   04/24/2021 Pathology Results   Cytology  Specimen Submitted:  A. ASCITES, PARACENTESIS:   FINAL MICROSCOPIC DIAGNOSIS:  - Atypical cells present  DIAGNOSTIC COMMENTS:  Immunohistochemical stains show that the rare, mildly atypical cells are positive for calretinin and D2-40; while they are negative for PAX8, ER and MOC-31.  This immunoprofile is consistent with reactive mesothelial cells.    04/25/2021 Initial Diagnosis   Primary peritoneal carcinomatosis ([place])   04/29/2021 Imaging   CT AP  IMPRESSION: 1. Moderate ascites, mild peritoneal thickening and diffuse omental thickening/caking and smaller focal fluid/cystic areas within the RIGHT abdomen and LOWER pelvis, compatible with peritoneal carcinomatosis. Mild mesenteric stranding is nonspecific. 2. Small subserosal implants along the INFERIOR RIGHT liver. 3. Small LEFT pleural effusion and trace RIGHT pleural effusion with LEFT LOWER lung atelectasis. 4. Aortic Atherosclerosis (ICD10-I70.0).   04/30/2021 Pathology Results   FINAL  MICROSCOPIC DIAGNOSIS:   A. PERITONEUM, BIOPSY:  -  Metastatic carcinoma with signet  ring cell features  -  See comment   B. 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   ADDENDUM:  By immunohistochemistry, HER-2 is EQUIVOCAL (2+).    FLOURESCENCE IN-SITU HYBRIDIZATION RESULTS:  GROUP 5:   HER2 **NEGATIVE**    05/04/2021 -  Chemotherapy   Patient is on Treatment Plan : GASTRIC FOLFOX q14d x 12 cycles     05/04/2021 Imaging   CT Chest  IMPRESSION: 1. Suspected segmental filling defects in the anterior segment right upper lobe and apical segment right upper lobe compatible with small pulmonary emboli. Clot burden appears small, although please note that today's examination was not a dedicated CT angiogram and as such might overestimate or underestimate pulmonary embolus. Positive for acute PE with CT evidence of right heart strain (RV/LV Ratio = 1.1) consistent with at least submassive (intermediate risk) PE. The presence of right heart strain has been associated with an increased risk of morbidity and mortality. Please refer to the "PE Focused" order set in EPIC. 2. No compelling findings of metastatic disease to the chest. There is a small left pleural effusion, but no definite enhancement along the pleura to suggest malignant or exudative etiology at this time. 3. There is some new gas in the soft tissues of the left thoracoabdominal wall, compatible subcutaneous emphysema, probably related to recent laparoscopy or other procedure. 4. Hypodense thyroid nodules up to 2.1 cm in diameter. Recommend thyroid US (ref: J Am Coll Radiol. 2015 Feb;12(2): 143-50). 5. Left upper quadrant ascites,  including fluid in the lesser sac. This is reduced compared to the prior CT abdomen of 04/29/2021. 6. Stable appearance of periportal edema and hypodensities along the posterior margin of the right hepatic lobe.   05/07/2021 Pathology Results   FINAL MICROSCOPIC DIAGNOSIS:   A. DUODENUM, BULB, BIOPSY:  - Polypoid duodenal mucosa with chronic inflammation, foveolar  metaplasia and reactive changes  - Negative for dysplasia or malignancy   Cytology: Specimen Submitted:  A. ESOPHAGUS, BRUSHING:   FINAL MICROSCOPIC DIAGNOSIS:  - Benign reactive/reparative changes    05/16/2021 Cancer Staging   Staging form: Soft Tissue Sarcoma of the Abdomen and Thoracic Visceral Organs, AJCC 8th Edition - Clinical: cTX, cN0, pM1 - Signed by [name], MD on 05/16/2021   08/10/2021 Imaging   CT CAP IMPRESSION: 1. Subserosal implants along the inferior right liver are no longer apparent or decreased in size when compared to prior exam. 2. Interval resolution of abdominal ascites. Decreased peritoneal thickening and stranding. 3. Interval resolution of small left pleural effusion. 4.  Aortic Atherosclerosis (ICD10-I70.0).        INTERVAL HISTORY:  CHENOA LUDDY is here for a follow up of peritoneal carcinomatosis. She was last seen by me on 04/15/22. She presents to the clinic alone. She reports new stomach pain and vomiting since her last visit.   All other systems were reviewed with the patient and are negative.  MEDICAL HISTORY:  Past Medical History:  Diagnosis Date   Acid reflux    Hypertension  SURGICAL HISTORY: Past Surgical History:  Procedure Laterality Date   BIOPSY  05/07/2021   Procedure: BIOPSY;  Surgeon: [name], MD;  Location: [facility];  Service: Endoscopy;;   BUNIONECTOMY Bilateral    ESOPHAGEAL BRUSHING  05/07/2021   Procedure: ESOPHAGEAL BRUSHING;  Surgeon: [name], MD;  Location: [facility];  Service: Endoscopy;;   ESOPHAGOGASTRODUODENOSCOPY (EGD)  WITH PROPOFOL N/A 05/07/2021   Procedure: ESOPHAGOGASTRODUODENOSCOPY (EGD) WITH PROPOFOL;  Surgeon: [name], MD;  Location: [facility];  Service: Endoscopy;  Laterality: N/A;   HAND TENDON SURGERY Bilateral    IR IMAGING GUIDED PORT INSERTION  04/26/2021   IR PARACENTESIS  04/24/2021   LAPAROSCOPY N/A 04/30/2021   Procedure: LAPAROSCOPY DIAGNOSTIC WITH BIOPSIES;  Surgeon: Lafonda Mosses, MD;  Location: [facility];  Service: Gynecology;  Laterality: N/A;   PARTIAL HYSTERECTOMY      I have reviewed the social history and family history with the patient and they are unchanged from previous note.  ALLERGIES:  has No Known Allergies.  MEDICATIONS:  No current facility-administered medications for this visit.   No current outpatient medications on file.   Facility-Administered Medications Ordered in Other Visits  Medication Dose Route Frequency Provider Last Rate Last Admin   0.9 %  sodium chloride infusion   Intravenous Continuous Doutova, Anastassia, MD       acetaminophen (TYLENOL) tablet 650 mg  650 mg Oral Q6H PRN Doutova, Anastassia, MD       Or   acetaminophen (TYLENOL) suppository 650 mg  650 mg Rectal Q6H PRN Doutova, Anastassia, MD       HYDROcodone-acetaminophen (NORCO/VICODIN) 5-325 MG per tablet 1-2 tablet  1-2 tablet Oral Q4H PRN Doutova, Anastassia, MD       HYDROmorphone (DILAUDID) injection 0.5 mg  0.5 mg Intravenous Q4H PRN Doutova, Anastassia, MD       potassium chloride 10 mEq in 100 mL IVPB  10 mEq Intravenous Q1 Hr x 4 Doutova, Anastassia, MD 100 mL/hr at 04/25/22 2152 10 mEq at 04/25/22 2152    PHYSICAL EXAMINATION: ECOG PERFORMANCE STATUS: 2 - Symptomatic, <50% confined to bed  Vitals:   04/25/22 1420  BP: (!) 160/96  Pulse: 91  Resp: 18  Temp: 97.9 F (36.6 C)  SpO2: 96%   Wt Readings from Last 3 Encounters:  04/25/22 184 lb (83.5 kg)  04/25/22 184 lb 3 oz (83.5 kg)  04/15/22 190 lb 4.8 oz (86.3 kg)     GENERAL:alert, no distress and  comfortable SKIN: skin color, texture, turgor are normal, no rashes or significant lesions EYES: normal, Conjunctiva are pink and non-injected, sclera clear  LUNGS: clear to auscultation and percussion with normal breathing effort HEART: regular rate & rhythm and no murmurs and no lower extremity edema ABDOMEN: soft, (+) tenderness to palpation NEURO: alert & oriented x 3 with fluent speech, no focal motor/sensory deficits  LABORATORY DATA:  I have reviewed the data as listed    Latest Ref Rng & Units 04/25/2022    4:04 PM 04/25/2022    1:14 PM 04/15/2022   11:21 AM  CBC  WBC 4.0 - 10.5 K/uL 7.6  8.1  6.5   Hemoglobin 12.0 - 15.0 g/dL 11.9  12.4  11.0   Hematocrit 36.0 - 46.0 % 33.6  34.2  31.5   Platelets 150 - 400 K/uL 291  338  251         Latest Ref Rng & Units 04/25/2022    4:04 PM 04/25/2022    1:14 PM  04/15/2022   11:21 AM  CMP  Glucose 70 - 99 mg/dL 118  127  96   BUN 8 - 23 mg/dL _0 Creatinine 0.44 - 1.00 mg/dL 1.14  1.19  1.10   Sodium 135 - 145 mmol/L 138  136  141   Potassium 3.5 - 5.1 mmol/L 3.2  3.5  3.8   Chloride 98 - 111 mmol/L 102  99  107   CO2 22 - 32 mmol/L _1 Calcium 8.9 - 10.3 mg/dL 9.4  9.7  9.5   Total Protein 6.5 - 8.1 g/dL 8.0  8.4  7.6   Total Bilirubin 0.3 - 1.2 mg/dL 1.0  0.8  0.6   Alkaline Phos 38 - 126 U/L 76  91  90   AST 15 - 41 U/L _2 ALT 0 - 44 U/L _3 RADIOGRAPHIC STUDIES: I have personally reviewed the radiological images as listed and agreed with the findings in the report. CT ABDOMEN PELVIS W CONTRAST  Result Date: 04/25/2022 CLINICAL DATA:  Bowel obstruction suspected. Vomiting. Patient presents from the cancer center. EXAM: CT ABDOMEN AND PELVIS WITH CONTRAST TECHNIQUE: Multidetector CT imaging of the abdomen and pelvis was performed using the standard protocol following bolus administration of intravenous contrast. RADIATION DOSE REDUCTION: This exam was performed according to the departmental  dose-optimization program which includes automated exposure control, adjustment of the mA and/or kV according to patient size and/or use of iterative reconstruction technique. CONTRAST:  161m OMNIPAQUE IOHEXOL 300 MG/ML  SOLN COMPARISON:  01/08/2022 FINDINGS: Lower chest: Mild dependent atelectasis in the lung bases. Small esophageal hiatal hernia. Hepatobiliary: No focal liver abnormality is seen. No gallstones, gallbladder wall thickening, or biliary dilatation. Pancreas: Unremarkable. No pancreatic ductal dilatation or surrounding inflammatory changes. Spleen: Normal in size without focal abnormality. Adrenals/Urinary Tract: Adrenal glands are unremarkable. Kidneys are normal, without renal calculi, focal lesion, or hydronephrosis. Bladder is unremarkable. Stomach/Bowel: Mild distention of small-bowel loops with gas and fluid. Mild small bowel wall thickening is suggested. Fecalization of small bowel contents. Decompression of the terminal ileum with transition zone suggested in the right lower quadrant. Decompressed stool-filled colon. Appendix is not identified. Vascular/Lymphatic: Aortic atherosclerosis. No enlarged abdominal or pelvic lymph nodes. Mesenteric vessels appear patent. Reproductive: Status post hysterectomy. No adnexal masses. Other: Moderate diffuse free fluid throughout the abdomen and pelvis. Nodular stranding in the mesentery and omentum. Peritoneal wall thickening. Changes are consistent with peritoneal carcinomatosis. Appearances have progressed since the previous study. No free air. Abdominal wall musculature appears intact. Musculoskeletal: Degenerative changes.  No acute bony abnormalities. IMPRESSION: 1. Moderate free fluid throughout the abdomen and pelvis with peritoneal wall thickening and implant suggesting peritoneal carcinomatosis, progressing since prior study. 2. Mildly dilated small bowel with wall thickening and fecalization of contents. Decompressed terminal ileum with  transition zone in the right lower quadrant. Appearance is consistent with small-bowel obstruction. 3. Small esophageal hiatal hernia. 4. Aortic atherosclerosis. Electronically Signed   By: WLucienne CapersM.D.   On: 04/25/2022 17:23   DG Abd 2 Views  Result Date: 04/25/2022 CLINICAL DATA:  recurrent abdominal pain and nausea, rule out bowel obstruction EXAM: ABDOMEN - 2 VIEW COMPARISON:  CT 01/08/2022 FINDINGS: There are multiple mildly dilated loops of small bowel with air-fluid level. Mild-to-moderate stool burden. No radiopaque calculi overlie the kidneys. IMPRESSION: Multiple mildly dilated loops of small  bowel with air-fluid levels. Findings could potentially represent a small-bowel obstruction. Consider CT. These results will be called to the ordering clinician or representative by the Radiologist Assistant, and communication documented in the PACS or [employer]. Electronically Signed   By: Maurine Simmering M.D.   On: 04/25/2022 13:15      No orders of the defined types were placed in this encounter.  All questions were answered. The patient knows to call the clinic with any problems, questions or concerns. No barriers to learning was detected. The total time spent in the appointment was 30 minutes.     [name], MD 04/25/2022   I, Wilburn Mylar, am acting as scribe for [name], MD.   I have reviewed the above documentation for accuracy and completeness, and I agree with the above.

## 2022-04-25 NOTE — Assessment & Plan Note (Signed)
Has been off her BP meds, BP running low will hold off for now

## 2022-04-25 NOTE — ED Triage Notes (Signed)
Came from cancer center with dx of SBO. Patient reports abd pain, nausea and difficulty eating and drink for past few days.

## 2022-04-25 NOTE — Assessment & Plan Note (Addendum)
Likely cause  Malignancy   - admit for conservative management  - NG tube attempted pt could not tolerate - NPO - KUB in AM  - General surgery is aware

## 2022-04-25 NOTE — Assessment & Plan Note (Signed)
Hold off on Eliquis for tonight , given SBO if needs to be off anticoagulation for prolonged time will switch to heparin

## 2022-04-25 NOTE — Assessment & Plan Note (Signed)
Followed by Dr. Burr Medico, will let them know pt has been admitted

## 2022-04-25 NOTE — Progress Notes (Signed)
Patient escorted to ED Room 13 for further evaluation for small bowel obstruction. Bedside report given to Jinny Blossom, RN and [employer], Therapist, sports.

## 2022-04-25 NOTE — ED Triage Notes (Signed)
Patient coming from the cancer center for a small bowel obstruction. Patient had scans done to confirm small bowel obstruction. Patient has had some emesis episodes today. Patient's last bowel movement was on yesterday. Hx of GI cancer.Patient has taken compazine which has helped some. Patient rates pain 8/10. Patient axox4 and ambulatory at baseline.

## 2022-04-25 NOTE — ED Provider Notes (Signed)
[facility] DEPT Provider Note  CSN: [id number] Arrival date & time: 04/25/22 1454  Chief Complaint(s) Abdominal Pain  HPI Brenda Stewart is a 71 y.o. 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   Abdominal Pain Pain location:  Periumbilical Pain quality: aching   Pain radiates to:  Does not radiate Pain severity:  Moderate Onset quality:  Gradual Duration:  3 days Timing: fluctuates. Progression:  Waxing and waning Chronicity: same as SBO last year. 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 Authorizing Provider  amLODipine (NORVASC) 10 MG tablet Take 5 mg by mouth daily. Take 1 tab by mouth daily (Hold Dose for Systolic BP Less than 106; Avoid Grapefruit or its juice) 08/20/21   [provider]  apixaban (ELIQUIS) 5 MG TABS tablet Take 1 tablet (5 mg total) by mouth 2 (two) times daily. 02/19/22   [name], MD  ascorbic  acid (VITAMIN C) 250 MG CHEW Chew 500 mg by mouth daily.    [provider]  capecitabine (XELODA) 500 MG tablet Take 4 tablets every 12 hours,  for 14 days on, 7 days off. Repeat every 21 days. Take after a meal 04/10/22   [name], MD  Cholecalciferol (VITAMIN D3) 50 MCG (2000 UT) CHEW Chew 2 tablets by mouth daily.    [provider]  furosemide (LASIX) 20 MG tablet TAKE 1 TABLET BY MOUTH ONCE DAILY ON MONDAY, WEDNESDAY, AND FRIDAY. TAKE WITH POTASSIUM 02/05/22   [name], MD  lidocaine-prilocaine (EMLA) cream Apply 1 application topically as needed. 06/04/21   [name], MD  lisinopril (ZESTRIL) 40 MG tablet Take 40 mg by mouth daily. TAKE 1 TAB BY MOUTH DAILY (HOLD DOSE FOR SYSTOLIC BLOOD PRESSURE LESS THAN 100; AVOID POTASSIUM SUPPLEMENT/ARTIFICIAL SALT) (HOLD DOSE FOR SYSTOLIC BLOOD PRESSURE LESS THAN 100; AVOID POTASSIUM SUPPLEMENTS/ARTIFICIAL SALT) 08/21/21   [provider]  potassium chloride SA (KLOR-CON M20) 20 MEQ tablet TAKE 1 TABLET BY MOUTH ONCE DAILY ON MONDAY, WEDNESDAY, AND FRIDAY. TAKE WITH LASIX 03/13/22   [name], MD  prochlorperazine (COMPAZINE) 10 MG tablet Take 1 tablet (10 mg total) by mouth every 6 (six) hours as needed for nausea or vomiting. 06/04/21   [name], MD  sennosides-docusate sodium (SENOKOT-S) 8.6-50 MG tablet Take 2 tablets by mouth daily as needed for constipation.    [provider]  gabapentin (NEURONTIN) 100 MG capsule Take 1 capsule (100 mg total) by mouth at bedtime. Patient not taking:  Reported on 02/27/2021 10/16/16 04/30/21  Lysbeth Penner, FNP                                                                                                                                    Allergies Patient has no known allergies.  Review of Systems Review of Systems  Gastrointestinal:  Positive for abdominal pain, nausea and vomiting. Negative for constipation and diarrhea.   As noted in HPI  Physical Exam Vital Signs  I have  reviewed the triage vital signs BP (!) 143/85   Pulse 80   Temp 97.8 F (36.6 C) (Oral)   Resp (!) 21   Ht '5\' 7"'$  (1.702 m)   Wt 83.5 kg   SpO2 98%   BMI 28.82 kg/m   Physical Exam Vitals reviewed.  Constitutional:      General: She is not in acute distress.    Appearance: She is well-developed. She is not diaphoretic.  HENT:     Head: Normocephalic and atraumatic.     Right Ear: External ear normal.     Left Ear: External ear normal.     Nose: Nose normal.  Eyes:     General: No scleral icterus.    Conjunctiva/sclera: Conjunctivae normal.  Neck:     Trachea: Phonation normal.  Cardiovascular:     Rate and Rhythm: Normal rate and regular rhythm.  Pulmonary:     Effort: Pulmonary effort is normal. No respiratory distress.     Breath sounds: No stridor.  Abdominal:     General: There is no distension.     Tenderness: There is abdominal tenderness in the suprapubic area. There is no guarding or rebound.  Musculoskeletal:        General: Normal range of motion.     Cervical back: Normal range of motion.  Neurological:     Mental Status: She is alert and oriented to person, place, and time.  Psychiatric:        Behavior: Behavior normal.     ED Results and Treatments Labs (all labs ordered are listed, but only abnormal results are displayed) Labs Reviewed  CBC WITH DIFFERENTIAL/PLATELET - Abnormal; Notable for the following components:      Result Value   RBC 3.07 (*)    Hemoglobin 11.9 (*)    HCT 33.6 (*)    MCV 109.4 (*)    MCH 38.8 (*)    Abs Immature Granulocytes 0.09 (*)    All other components within normal limits  COMPREHENSIVE METABOLIC PANEL - Abnormal; Notable for the following components:   Potassium 3.2 (*)    Glucose, Bld 118 (*)    Creatinine, Ser 1.14 (*)    GFR, Estimated 52 (*)    All other components within normal limits  LIPASE, BLOOD  URINALYSIS, ROUTINE W REFLEX MICROSCOPIC  EKG  EKG Interpretation  Date/Time: 04/25/2022 16:06:15   Ventricular Rate:   79 PR Interval:   184 QRS Duration:  105 QT Interval:  380  QTC Calculation:  436 R Axis:    65 Text Interpretation: Sinus rhythm Abnormal inferior Q waves No acute changes Confirmed by Addison Lank [phone]) on 04/25/2022 4:15:16 PM         Radiology CT ABDOMEN PELVIS W CONTRAST  Result Date: 04/25/2022 CLINICAL DATA:  Bowel obstruction suspected. Vomiting. Patient presents from the cancer center. EXAM: CT ABDOMEN AND PELVIS WITH CONTRAST TECHNIQUE: Multidetector CT imaging of the abdomen and pelvis was performed using the standard protocol following bolus administration of intravenous contrast. RADIATION DOSE REDUCTION: This exam was performed according to the departmental dose-optimization program which includes automated exposure control, adjustment of the mA and/or kV according to patient size and/or use of iterative reconstruction technique. CONTRAST:  145m OMNIPAQUE IOHEXOL 300 MG/ML  SOLN COMPARISON:  01/08/2022 FINDINGS: Lower chest: Mild dependent atelectasis in the lung bases. Small esophageal hiatal hernia. Hepatobiliary: No focal liver abnormality is seen. No gallstones, gallbladder wall thickening, or biliary dilatation. Pancreas: Unremarkable. No pancreatic ductal dilatation or surrounding inflammatory changes. Spleen: Normal in size without focal abnormality. Adrenals/Urinary Tract: Adrenal glands are unremarkable. Kidneys are normal, without renal calculi, focal lesion, or hydronephrosis. Bladder is unremarkable. Stomach/Bowel: Mild distention of small-bowel loops with gas and fluid. Mild small bowel wall thickening is suggested. Fecalization of small bowel contents. Decompression of the terminal ileum with transition zone suggested in the right lower quadrant. Decompressed stool-filled colon. Appendix is not identified. Vascular/Lymphatic: Aortic  atherosclerosis. No enlarged abdominal or pelvic lymph nodes. Mesenteric vessels appear patent. Reproductive: Status post hysterectomy. No adnexal masses. Other: Moderate diffuse free fluid throughout the abdomen and pelvis. Nodular stranding in the mesentery and omentum. Peritoneal wall thickening. Changes are consistent with peritoneal carcinomatosis. Appearances have progressed since the previous study. No free air. Abdominal wall musculature appears intact. Musculoskeletal: Degenerative changes.  No acute bony abnormalities. IMPRESSION: 1. Moderate free fluid throughout the abdomen and pelvis with peritoneal wall thickening and implant suggesting peritoneal carcinomatosis, progressing since prior study. 2. Mildly dilated small bowel with wall thickening and fecalization of contents. Decompressed terminal ileum with transition zone in the right lower quadrant. Appearance is consistent with small-bowel obstruction. 3. Small esophageal hiatal hernia. 4. Aortic atherosclerosis. Electronically Signed   By: WLucienne CapersM.D.   On: 04/25/2022 17:23   DG Abd 2 Views  Result Date: 04/25/2022 CLINICAL DATA:  recurrent abdominal pain and nausea, rule out bowel obstruction EXAM: ABDOMEN - 2 VIEW COMPARISON:  CT 01/08/2022 FINDINGS: There are multiple mildly dilated loops of small bowel with air-fluid level. Mild-to-moderate stool burden. No radiopaque calculi overlie the kidneys. IMPRESSION: Multiple mildly dilated loops of small bowel with air-fluid levels. Findings could potentially represent a small-bowel obstruction. Consider CT. These results will be called to the ordering clinician or representative by the Radiologist Assistant, and communication documented in the PACS or [employer] Electronically Signed   By: JMaurine SimmeringM.D.   On: 04/25/2022 13:15    Pertinent labs & imaging results that were available during my care of the patient were reviewed by me and considered in my medical decision making (see  MDM for details).  Medications Ordered in ED Medications  sodium chloride 0.9 % bolus 1,000 mL (0 mLs Intravenous Stopped 04/25/22 1754)  ondansetron (ZOFRAN) injection 4 mg (4 mg Intravenous Given 04/25/22 1631)  iohexol (OMNIPAQUE) 300 MG/ML solution 100  mL (100 mLs Intravenous Contrast Given 04/25/22 1704)  HYDROmorphone (DILAUDID) injection 0.5 mg (0.5 mg Intravenous Given 04/25/22 1753)                                                                                                                                     Procedures Procedures  (including critical care time)  Medical Decision Making / ED Course    Complexity of Problem:  Co-morbidities/SDOH that complicate the patient evaluation/care: Noted above in HPI  Additional history obtained: Clinic note from oncology today who evaluated the patient for the abdominal pain and obtain a KUB concerning for small bowel obstruction  Patient's presenting problem/concern, DDX, and MDM listed below: Abdominal pain Given her recent KUB, small bowel obstruction is the most likely etiology. We will need to obtain a CT scan to assess patient's cancer progression and cause of small bowel obstruction and to rule out any other inflammatory/infectious process or perforation.  Hospitalization Considered:  yes  Initial Intervention:  IV fluids, antiemetics and pain medicine    Complexity of Data:   Cardiac Monitoring: The patient was maintained on a cardiac monitor.   I personally viewed and interpreted the cardiac monitored which showed an underlying rhythm of NSR in 80s EKG without acute ischemic changes dysrhythmias or blocks.  Laboratory Tests ordered listed below with my independent interpretation: CBC without leukocytosis.  Stable hemoglobin. Metabolic panel with mild hypokalemia.  No other serious electrolyte derangements.  Mild renal sufficiency.  No evidence of bili obstruction or pancreatitis.   Imaging Studies ordered listed  below with my independent interpretation: KUB from earlier in the day did show evidence of small bowel obstruction CT scan confirmed small bowel obstruction. Radiology confirmed and also noted worsening/progression of her carcinomatosis.     ED Course:    Assessment, Add'l Intervention, and Reassessment: Abdominal pain Confirmed small bowel obstruction Patient given IV fluids, antiemetics and pain medicine. NG tube placed We will admit to medicine for further management.    Final Clinical Impression(s) / ED Diagnoses Final diagnoses:  SBO (small bowel obstruction) ([place])           This chart was dictated using voice recognition software.  Despite best efforts to proofread,  errors can occur which can change the documentation meaning.    Fatima Blank, MD 04/26/22 [phone]

## 2022-04-25 NOTE — ED Notes (Signed)
Attempted NG which was unsuccessful.  Patient couldn't not tolerate tube.

## 2022-04-25 NOTE — Assessment & Plan Note (Signed)
-  chronic avoid nephrotoxic medications such as NSAIDs, Vanco Zosyn combo,  avoid hypotension, continue to follow renal function  

## 2022-04-25 NOTE — Consult Note (Signed)
Kasie Leccese [facility] 10-12-1950  [id number].    Requesting MD: Dr. Leonette Monarch Chief Complaint/Reason for Consult: small bowel obstruction  HPI:  Ms. Nebergall is a 71 yo female with a history of peritoneal carcinomatosis, who presented to the ED with concerns for a small bowel obstruction. She first presented a year ago with nausea and vomiting, and a CT scan showed ascites with signs of peritoneal carcinomatosis. Her CA-125 was elevated at the time, and she underwent a diagnostic laparoscopy by Dr. Berline Lopes on 04/30/21. Peritoneal biopsies showed signet ring cells, most consistent with an upper GI primary, although EGD did not show a source. She follows with Dr. Burr Medico, and completed 12 cycles FOLFOX in January 2023. She was subsequently started on maintenance Xeloda. Restaging scans in April did not show any evidence of disease, and she remained on Xeloda.  For the last few days she has been having nausea, PO intolerance, vomiting and abdominal pain. She had a loose bowel movement last night but denies any bowel movements or flatus today. She was sent for a CT abd/pelvis today, which showed ascites, peritoneal wall thickening, and mild small bowel dilation concerning for SBO. NG tube placement was attempted but not successful. General surgery was consulted.  The patient's only prior abdominal surgery is the diagnostic laparoscopy last year.   ROS: Review of Systems  Constitutional:  Negative for chills and fever.  Respiratory:  Negative for cough.   Cardiovascular:  Negative for chest pain.  Gastrointestinal:  Positive for abdominal pain, nausea and vomiting.  Neurological:  Positive for weakness.    History reviewed. No pertinent family history.  Past Medical History:  Diagnosis Date   Acid reflux    Hypertension     Past Surgical History:  Procedure Laterality Date   BIOPSY  05/07/2021   Procedure: BIOPSY;  Surgeon: [name], MD;  Location: [facility];  Service: Endoscopy;;    BUNIONECTOMY Bilateral    ESOPHAGEAL BRUSHING  05/07/2021   Procedure: ESOPHAGEAL BRUSHING;  Surgeon: [name], MD;  Location: [facility];  Service: Endoscopy;;   ESOPHAGOGASTRODUODENOSCOPY (EGD) WITH PROPOFOL N/A 05/07/2021   Procedure: ESOPHAGOGASTRODUODENOSCOPY (EGD) WITH PROPOFOL;  Surgeon: [name], MD;  Location: [facility];  Service: Endoscopy;  Laterality: N/A;   HAND TENDON SURGERY Bilateral    IR IMAGING GUIDED PORT INSERTION  04/26/2021   IR PARACENTESIS  04/24/2021   LAPAROSCOPY N/A 04/30/2021   Procedure: LAPAROSCOPY DIAGNOSTIC WITH BIOPSIES;  Surgeon: Lafonda Mosses, MD;  Location: [facility];  Service: Gynecology;  Laterality: N/A;   PARTIAL HYSTERECTOMY      Social History:  reports that she has never smoked. She has never used smokeless tobacco. She reports current alcohol use. She reports that she does not use drugs.  Allergies: No Known Allergies  (Not in a hospital admission)    Physical Exam: Blood pressure 100/87, pulse 86, temperature 98.1 F (36.7 C), temperature source Oral, resp. rate 12, height '5\' 7"'$  (1.702 m), weight 83.5 kg, SpO2 97 %. General: resting comfortably, appears stated age, no apparent distress Neurological: alert and oriented, no focal deficits, cranial nerves grossly in tact HEENT: normocephalic, atraumatic, oropharynx clear, no scleral icterus CV: regular rate and rhythm,  Respiratory: normal work of breathing on room air, symmetric chest wall expansion Abdomen: soft, minimally distended, mild tenderness to palpation, no rebound tenderness or guarding. Extremities: warm and well-perfused, no deformities, moving all extremities spontaneously Psychiatric: normal mood and affect Skin: warm and dry, no jaundice, no rashes or lesions  Results for orders placed or performed during the hospital encounter of 04/25/22 (from the past 48 hour(s))  CBC with Differential     Status: Abnormal   Collection Time: 04/25/22  4:04 PM  Result Value  Ref Range   WBC 7.6 4.0 - 10.5 K/uL   RBC 3.07 (L) 3.87 - 5.11 MIL/uL   Hemoglobin 11.9 (L) 12.0 - 15.0 g/dL   HCT 33.6 (L) 36.0 - 46.0 %   MCV 109.4 (H) 80.0 - 100.0 fL   MCH 38.8 (H) 26.0 - 34.0 pg   MCHC 35.4 30.0 - 36.0 g/dL   RDW 14.3 11.5 - 15.5 %   Platelets 291 150 - 400 K/uL   nRBC 0.0 0.0 - 0.2 %   Neutrophils Relative % 72 %   Neutro Abs 5.5 1.7 - 7.7 K/uL   Lymphocytes Relative 18 %   Lymphs Abs 1.3 0.7 - 4.0 K/uL   Monocytes Relative 8 %   Monocytes Absolute 0.6 0.1 - 1.0 K/uL   Eosinophils Relative 1 %   Eosinophils Absolute 0.0 0.0 - 0.5 K/uL   Basophils Relative 0 %   Basophils Absolute 0.0 0.0 - 0.1 K/uL   Immature Granulocytes 1 %   Abs Immature Granulocytes 0.09 (H) 0.00 - 0.07 K/uL    Comment: Performed at [facility], [place] [address] [place], [place] [address]  Comprehensive metabolic panel     Status: Abnormal   Collection Time: 04/25/22  4:04 PM  Result Value Ref Range   Sodium 138 135 - 145 mmol/L   Potassium 3.2 (L) 3.5 - 5.1 mmol/L   Chloride 102 98 - 111 mmol/L   CO2 24 22 - 32 mmol/L   Glucose, Bld 118 (H) 70 - 99 mg/dL    Comment: Glucose reference range applies only to samples taken after fasting for at least 8 hours.   BUN 15 8 - 23 mg/dL   Creatinine, Ser 1.14 (H) 0.44 - 1.00 mg/dL   Calcium 9.4 8.9 - 10.3 mg/dL   Total Protein 8.0 6.5 - 8.1 g/dL   Albumin 3.8 3.5 - 5.0 g/dL   AST 17 15 - 41 U/L   ALT 12 0 - 44 U/L   Alkaline Phosphatase 76 38 - 126 U/L   Total Bilirubin 1.0 0.3 - 1.2 mg/dL   GFR, Estimated 52 (L) >60 mL/min    Comment: (NOTE) Calculated using the CKD-EPI Creatinine Equation (2021)    Anion gap 12 5 - 15    Comment: Performed at [facility], [place] [address], [place], [place] [address]  Lipase, blood     Status: None   Collection Time: 04/25/22  4:04 PM  Result Value Ref Range   Lipase 34 11 - 51 U/L    Comment: Performed at [facility] [place] [address],  [place], [place] [address]  CK     Status: None   Collection Time: 04/25/22  4:04 PM  Result Value Ref Range   Total CK 61 38 - 234 U/L    Comment: Performed at [facility], [place] [address], [place], [place] [address]  Magnesium     Status: None   Collection Time: 04/25/22  4:04 PM  Result Value Ref Range   Magnesium 1.9 1.7 - 2.4 mg/dL    Comment: Performed at [facility], [place] [address] [place], [place] [address]  Phosphorus     Status: None   Collection Time: 04/25/22  4:04  PM  Result Value Ref Range   Phosphorus 3.1 2.5 - 4.6 mg/dL    Comment: Performed at [facility], [place] [address] [place],  [address]   CT ABDOMEN PELVIS W CONTRAST  Result Date: 04/25/2022 CLINICAL DATA:  Bowel obstruction suspected. Vomiting. Patient presents from the cancer center. EXAM: CT ABDOMEN AND PELVIS WITH CONTRAST TECHNIQUE: Multidetector CT imaging of the abdomen and pelvis was performed using the standard protocol following bolus administration of intravenous contrast. RADIATION DOSE REDUCTION: This exam was performed according to the departmental dose-optimization program which includes automated exposure control, adjustment of the mA and/or kV according to patient size and/or use of iterative reconstruction technique. CONTRAST:  147m OMNIPAQUE IOHEXOL 300 MG/ML  SOLN COMPARISON:  01/08/2022 FINDINGS: Lower chest: Mild dependent atelectasis in the lung bases. Small esophageal hiatal hernia. Hepatobiliary: No focal liver abnormality is seen. No gallstones, gallbladder wall thickening, or biliary dilatation. Pancreas: Unremarkable. No pancreatic ductal dilatation or surrounding inflammatory changes. Spleen: Normal in size without focal abnormality. Adrenals/Urinary Tract: Adrenal glands are unremarkable. Kidneys are normal, without renal calculi, focal lesion, or hydronephrosis. Bladder is unremarkable. Stomach/Bowel: Mild distention of small-bowel  loops with gas and fluid. Mild small bowel wall thickening is suggested. Fecalization of small bowel contents. Decompression of the terminal ileum with transition zone suggested in the right lower quadrant. Decompressed stool-filled colon. Appendix is not identified. Vascular/Lymphatic: Aortic atherosclerosis. No enlarged abdominal or pelvic lymph nodes. Mesenteric vessels appear patent. Reproductive: Status post hysterectomy. No adnexal masses. Other: Moderate diffuse free fluid throughout the abdomen and pelvis. Nodular stranding in the mesentery and omentum. Peritoneal wall thickening. Changes are consistent with peritoneal carcinomatosis. Appearances have progressed since the previous study. No free air. Abdominal wall musculature appears intact. Musculoskeletal: Degenerative changes.  No acute bony abnormalities. IMPRESSION: 1. Moderate free fluid throughout the abdomen and pelvis with peritoneal wall thickening and implant suggesting peritoneal carcinomatosis, progressing since prior study. 2. Mildly dilated small bowel with wall thickening and fecalization of contents. Decompressed terminal ileum with transition zone in the right lower quadrant. Appearance is consistent with small-bowel obstruction. 3. Small esophageal hiatal hernia. 4. Aortic atherosclerosis. Electronically Signed   By: WLucienne CapersM.D.   On: 04/25/2022 17:23   DG Abd 2 Views  Result Date: 04/25/2022 CLINICAL DATA:  recurrent abdominal pain and nausea, rule out bowel obstruction EXAM: ABDOMEN - 2 VIEW COMPARISON:  CT 01/08/2022 FINDINGS: There are multiple mildly dilated loops of small bowel with air-fluid level. Mild-to-moderate stool burden. No radiopaque calculi overlie the kidneys. IMPRESSION: Multiple mildly dilated loops of small bowel with air-fluid levels. Findings could potentially represent a small-bowel obstruction. Consider CT. These results will be called to the ordering clinician or representative by the Radiologist  Assistant, and communication documented in the PACS or [employer] Electronically Signed   By: JMaurine SimmeringM.D.   On: 04/25/2022 13:15      Assessment/Plan This is a 71yo female with a history of signet ring cell adenocarcinoma with carcinomatosis, likely from an upper GI primary, treated with FOLFOX and maintenance Xeloda. She is now presenting with recurrent abdominal pain and vomiting, with evidence of disease progression on imaging. She has mild small bowel dilation but there is also some fecalization, suggesting a chronic component. This is very likely a malignant obstruction secondary to progression of her known carcinomatosis, and a surgical intervention is not likely to help her. NG decompression was not successful and patient is resistant to further attempts. Ok to hold  off for now as she is not currently vomiting and there is no gastric distension. If she has no emesis overnight, can consider an oral gastrografin challenge in the morning. If obstructive symptoms do not resolve with non-operative treatment, I recommend a goals of care discussion.  Level of medical decision-making: High  Michaelle Birks, MD [facility] General, Hepatobiliary and Pancreatic Surgery 04/25/22 7:38 PM

## 2022-04-25 NOTE — Assessment & Plan Note (Signed)
-   will replace and repeat in AM,  check magnesium level and replace as needed ° °

## 2022-04-25 NOTE — ED Notes (Signed)
ED TO INPATIENT HANDOFF REPORT  ED Nurse Name and Phone #: Elpidio Eric [phone]  S Name/Age/Gender Lars Masson Pruiett 71 y.o. female Room/Bed: WA13/WA13  Code Status   Code Status: Prior  Home/SNF/Other Home Patient oriented to: self, place, time, and situation Is this baseline? Yes   Triage Complete: Triage complete  Chief Complaint SBO (small bowel obstruction) ([place]) [K56.609]  Triage Note Patient coming from the cancer center for a small bowel obstruction. Patient had scans done to confirm small bowel obstruction. Patient has had some emesis episodes today. Patient's last bowel movement was on yesterday. Hx of GI cancer.Patient has taken compazine which has helped some. Patient rates pain 8/10. Patient axox4 and ambulatory at baseline.  Came from cancer center with dx of SBO. Patient reports abd pain, nausea and difficulty eating and drink for past few days.    Allergies No Known Allergies  Level of Care/Admitting Diagnosis ED Disposition     ED Disposition  Admit   Condition  --   Comment  Hospital Area: [facility] [[id number]]  Level of Care: Telemetry [5]  Admit to tele based on following criteria: Other see comments  Comments: hypokalemia  May admit patient to [facility] or [facility] if equivalent level of care is available:: No  Covid Evaluation: Asymptomatic - no recent exposure (last 10 days) testing not required  Diagnosis: SBO (small bowel obstruction) [facility]) [[id number]]  Admitting Physician: Toy Baker [[id number]]  Attending Physician: Toy Baker [[id number]]  Certification:: I certify this patient will need inpatient services for at least 2 midnights  Estimated Length of Stay: 2          B Medical/Surgery History Past Medical History:  Diagnosis Date   Acid reflux    Hypertension    Past Surgical History:  Procedure Laterality Date   BIOPSY  05/07/2021   Procedure: BIOPSY;  Surgeon: [name], MD;  Location: [facility]  ENDOSCOPY;  Service: Endoscopy;;   BUNIONECTOMY Bilateral    ESOPHAGEAL BRUSHING  05/07/2021   Procedure: ESOPHAGEAL BRUSHING;  Surgeon: [name], MD;  Location: [facility] ENDOSCOPY;  Service: Endoscopy;;   ESOPHAGOGASTRODUODENOSCOPY (EGD) WITH PROPOFOL N/A 05/07/2021   Procedure: ESOPHAGOGASTRODUODENOSCOPY (EGD) WITH PROPOFOL;  Surgeon: [name], MD;  Location: [facility] ENDOSCOPY;  Service: Endoscopy;  Laterality: N/A;   HAND TENDON SURGERY Bilateral    IR IMAGING GUIDED PORT INSERTION  04/26/2021   IR PARACENTESIS  04/24/2021   LAPAROSCOPY N/A 04/30/2021   Procedure: LAPAROSCOPY DIAGNOSTIC WITH BIOPSIES;  Surgeon: Lafonda Mosses, MD;  Location: [facility] ORS;  Service: Gynecology;  Laterality: N/A;   PARTIAL HYSTERECTOMY       A IV Location/Drains/Wounds Patient Lines/Drains/Airways Status     Active Line/Drains/Airways     Name Placement date Placement time Site Days   Implanted Port 04/26/21 Right Chest 04/26/21  1401  Chest  364   PICC Double Lumen 95/63/87 PICC Left Basilic 44 cm 0 cm 56/43/32  1455  -- 356   Incision (Closed) 04/30/21 Abdomen 04/30/21  1717  -- 360            Intake/Output Last 24 hours  Intake/Output Summary (Last 24 hours) at 04/25/2022 2055 Last data filed at 04/25/2022 1754 Gross per 24 hour  Intake 1000 ml  Output --  Net 1000 ml    Labs/Imaging Results for orders placed or performed during the hospital encounter of 04/25/22 (from the past 48 hour(s))  CBC with Differential     Status: Abnormal   Collection Time:  04/25/22  4:04 PM  Result Value Ref Range   WBC 7.6 4.0 - 10.5 K/uL   RBC 3.07 (L) 3.87 - 5.11 MIL/uL   Hemoglobin 11.9 (L) 12.0 - 15.0 g/dL   HCT 33.6 (L) 36.0 - 46.0 %   MCV 109.4 (H) 80.0 - 100.0 fL   MCH 38.8 (H) 26.0 - 34.0 pg   MCHC 35.4 30.0 - 36.0 g/dL   RDW 14.3 11.5 - 15.5 %   Platelets 291 150 - 400 K/uL   nRBC 0.0 0.0 - 0.2 %   Neutrophils Relative % 72 %   Neutro Abs 5.5 1.7 - 7.7 K/uL   Lymphocytes Relative 18 %   Lymphs Abs  1.3 0.7 - 4.0 K/uL   Monocytes Relative 8 %   Monocytes Absolute 0.6 0.1 - 1.0 K/uL   Eosinophils Relative 1 %   Eosinophils Absolute 0.0 0.0 - 0.5 K/uL   Basophils Relative 0 %   Basophils Absolute 0.0 0.0 - 0.1 K/uL   Immature Granulocytes 1 %   Abs Immature Granulocytes 0.09 (H) 0.00 - 0.07 K/uL    Comment: Performed at [facility], [place] [address] [place], [place] [address]  Comprehensive metabolic panel     Status: Abnormal   Collection Time: 04/25/22  4:04 PM  Result Value Ref Range   Sodium 138 135 - 145 mmol/L   Potassium 3.2 (L) 3.5 - 5.1 mmol/L   Chloride 102 98 - 111 mmol/L   CO2 24 22 - 32 mmol/L   Glucose, Bld 118 (H) 70 - 99 mg/dL    Comment: Glucose reference range applies only to samples taken after fasting for at least 8 hours.   BUN 15 8 - 23 mg/dL   Creatinine, Ser 1.14 (H) 0.44 - 1.00 mg/dL   Calcium 9.4 8.9 - 10.3 mg/dL   Total Protein 8.0 6.5 - 8.1 g/dL   Albumin 3.8 3.5 - 5.0 g/dL   AST 17 15 - 41 U/L   ALT 12 0 - 44 U/L   Alkaline Phosphatase 76 38 - 126 U/L   Total Bilirubin 1.0 0.3 - 1.2 mg/dL   GFR, Estimated 52 (L) >60 mL/min    Comment: (NOTE) Calculated using the CKD-EPI Creatinine Equation (2021)    Anion gap 12 5 - 15    Comment: Performed at [facility], [place] [address] [place], [place] [address]  Lipase, blood     Status: None   Collection Time: 04/25/22  4:04 PM  Result Value Ref Range   Lipase 34 11 - 51 U/L    Comment: Performed at [facility], [place] [address] [place], [place] [address]  CK     Status: None   Collection Time: 04/25/22  4:04 PM  Result Value Ref Range   Total CK 61 38 - 234 U/L    Comment: Performed at [facility], [place] [address], [place], [place] [address]  Magnesium     Status: None   Collection Time: 04/25/22  4:04 PM  Result Value Ref Range   Magnesium 1.9 1.7 - 2.4 mg/dL    Comment: Performed at [facility], [place] [address] [place], [place] [address]  Phosphorus     Status: None   Collection Time: 04/25/22  4:04 PM  Result Value Ref Range   Phosphorus 3.1 2.5 - 4.6 mg/dL    Comment: Performed at [facility], [place] [address], [place], [place] [address]  CT ABDOMEN PELVIS W CONTRAST  Result Date: 04/25/2022 CLINICAL DATA:  Bowel obstruction suspected. Vomiting. Patient presents from the cancer center. EXAM: CT ABDOMEN AND PELVIS WITH CONTRAST TECHNIQUE: Multidetector CT imaging of the abdomen and pelvis was performed using the standard protocol following bolus administration of intravenous contrast. RADIATION DOSE REDUCTION: This exam was performed according to the departmental dose-optimization program which includes automated exposure control, adjustment of the mA and/or kV according to patient size and/or use of iterative reconstruction technique. CONTRAST:  134m OMNIPAQUE IOHEXOL 300 MG/ML  SOLN COMPARISON:  01/08/2022 FINDINGS: Lower chest: Mild dependent atelectasis in the lung bases. Small esophageal hiatal hernia. Hepatobiliary: No focal liver abnormality is seen. No gallstones, gallbladder wall thickening, or biliary dilatation. Pancreas: Unremarkable. No pancreatic ductal dilatation or surrounding inflammatory changes. Spleen: Normal in size without focal abnormality. Adrenals/Urinary Tract: Adrenal glands are unremarkable. Kidneys are normal, without renal calculi, focal lesion, or hydronephrosis. Bladder is unremarkable. Stomach/Bowel: Mild distention of small-bowel loops with gas and fluid. Mild small bowel wall thickening is suggested. Fecalization of small bowel contents. Decompression of the terminal ileum with transition zone suggested in the right lower quadrant. Decompressed stool-filled colon. Appendix is not identified. Vascular/Lymphatic: Aortic atherosclerosis. No enlarged abdominal or pelvic lymph nodes. Mesenteric vessels appear patent. Reproductive: Status post  hysterectomy. No adnexal masses. Other: Moderate diffuse free fluid throughout the abdomen and pelvis. Nodular stranding in the mesentery and omentum. Peritoneal wall thickening. Changes are consistent with peritoneal carcinomatosis. Appearances have progressed since the previous study. No free air. Abdominal wall musculature appears intact. Musculoskeletal: Degenerative changes.  No acute bony abnormalities. IMPRESSION: 1. Moderate free fluid throughout the abdomen and pelvis with peritoneal wall thickening and implant suggesting peritoneal carcinomatosis, progressing since prior study. 2. Mildly dilated small bowel with wall thickening and fecalization of contents. Decompressed terminal ileum with transition zone in the right lower quadrant. Appearance is consistent with small-bowel obstruction. 3. Small esophageal hiatal hernia. 4. Aortic atherosclerosis. Electronically Signed   By: WLucienne CapersM.D.   On: 04/25/2022 17:23   DG Abd 2 Views  Result Date: 04/25/2022 CLINICAL DATA:  recurrent abdominal pain and nausea, rule out bowel obstruction EXAM: ABDOMEN - 2 VIEW COMPARISON:  CT 01/08/2022 FINDINGS: There are multiple mildly dilated loops of small bowel with air-fluid level. Mild-to-moderate stool burden. No radiopaque calculi overlie the kidneys. IMPRESSION: Multiple mildly dilated loops of small bowel with air-fluid levels. Findings could potentially represent a small-bowel obstruction. Consider CT. These results will be called to the ordering clinician or representative by the Radiologist Assistant, and communication documented in the PACS or [employer] Electronically Signed   By: JMaurine SimmeringM.D.   On: 04/25/2022 13:15    Pending Labs Unresulted Labs (From admission, onward)     Start     Ordered   04/26/22 0500  Prealbumin  Tomorrow morning,   R        04/25/22 1849   04/25/22 1926  Urinalysis, Complete w Microscopic  Once,   R       Question:  Release to patient  Answer:  Immediate    04/25/22 1925   04/25/22 1537  Urinalysis, Routine w reflex microscopic  (ED Abdominal Pain)  Once,   URGENT        04/25/22 1537   Signed and Held  HIV Antibody (routine testing w rflx)  (HIV Antibody (Routine testing w reflex) panel)  Once,   R        Signed and Held  Signed and Held  Comprehensive metabolic panel  Tomorrow morning,   R       Question:  Release to patient  Answer:  Immediate   Signed and Held   Signed and Held  CBC  Tomorrow morning,   R       Question:  Release to patient  Answer:  Immediate   Signed and Held            Vitals/Pain Today's Vitals   04/25/22 1730 04/25/22 1800 04/25/22 1903 04/25/22 1904  BP: (!) 143/85 138/79  100/87  Pulse: 80 83  86  Resp: (!) '21 17  12  '$ Temp:   98.1 F (36.7 C)   TempSrc:   Oral   SpO2: 98% 98%  97%  Weight:      Height:      PainSc:        Isolation Precautions No active isolations  Medications Medications  potassium chloride 10 mEq in 100 mL IVPB (10 mEq Intravenous New Bag/Given 04/25/22 1909)  sodium chloride 0.9 % bolus 1,000 mL (0 mLs Intravenous Stopped 04/25/22 1754)  ondansetron (ZOFRAN) injection 4 mg (4 mg Intravenous Given 04/25/22 1631)  iohexol (OMNIPAQUE) 300 MG/ML solution 100 mL (100 mLs Intravenous Contrast Given 04/25/22 1704)  HYDROmorphone (DILAUDID) injection 0.5 mg (0.5 mg Intravenous Given 04/25/22 1753)    Mobility walks Low fall risk   Focused Assessments    R Recommendations: See Admitting Provider Note  Report given to:   Additional Notes:

## 2022-04-26 ENCOUNTER — Other Ambulatory Visit (HOSPITAL_COMMUNITY): Payer: Self-pay

## 2022-04-26 ENCOUNTER — Other Ambulatory Visit: Payer: Self-pay | Admitting: Hematology

## 2022-04-26 ENCOUNTER — Inpatient Hospital Stay (HOSPITAL_COMMUNITY): Payer: Medicare Other

## 2022-04-26 DIAGNOSIS — K56609 Unspecified intestinal obstruction, unspecified as to partial versus complete obstruction: Secondary | ICD-10-CM | POA: Diagnosis not present

## 2022-04-26 LAB — URINALYSIS, COMPLETE (UACMP) WITH MICROSCOPIC
Bilirubin Urine: NEGATIVE
Glucose, UA: NEGATIVE mg/dL
Hgb urine dipstick: NEGATIVE
Ketones, ur: 20 mg/dL — AB
Leukocytes,Ua: NEGATIVE
Nitrite: NEGATIVE
Protein, ur: 30 mg/dL — AB
Specific Gravity, Urine: >1.046 — ABNORMAL HIGH (ref 1.005–1.030)
pH: 5 (ref 5.0–8.0)

## 2022-04-26 LAB — COMPREHENSIVE METABOLIC PANEL
ALT: 11 U/L (ref 0–44)
AST: 13 U/L — ABNORMAL LOW (ref 15–41)
Albumin: 3.3 g/dL — ABNORMAL LOW (ref 3.5–5.0)
Alkaline Phosphatase: 67 U/L (ref 38–126)
Anion gap: 9 (ref 5–15)
BUN: 13 mg/dL (ref 8–23)
CO2: 23 mmol/L (ref 22–32)
Calcium: 9.1 mg/dL (ref 8.9–10.3)
Chloride: 105 mmol/L (ref 98–111)
Creatinine, Ser: 0.89 mg/dL (ref 0.44–1.00)
GFR, Estimated: >60 mL/min (ref >60)
Glucose, Bld: 112 mg/dL — ABNORMAL HIGH (ref 70–99)
Potassium: 3.9 mmol/L (ref 3.5–5.1)
Sodium: 137 mmol/L (ref 135–145)
Total Bilirubin: 0.7 mg/dL (ref 0.3–1.2)
Total Protein: 6.9 g/dL (ref 6.5–8.1)

## 2022-04-26 LAB — CBC
HCT: 31.4 % — ABNORMAL LOW (ref 36.0–46.0)
Hemoglobin: 10.9 g/dL — ABNORMAL LOW (ref 12.0–15.0)
MCH: 38.7 pg — ABNORMAL HIGH (ref 26.0–34.0)
MCHC: 34.7 g/dL (ref 30.0–36.0)
MCV: 111.3 fL — ABNORMAL HIGH (ref 80.0–100.0)
Platelets: 265 10*3/uL (ref 150–400)
RBC: 2.82 MIL/uL — ABNORMAL LOW (ref 3.87–5.11)
RDW: 14.4 % (ref 11.5–15.5)
WBC: 8.5 10*3/uL (ref 4.0–10.5)
nRBC: 0 % (ref 0.0–0.2)

## 2022-04-26 LAB — HEPARIN LEVEL (UNFRACTIONATED): Heparin Unfractionated: 0.6 IU/mL (ref 0.30–0.70)

## 2022-04-26 LAB — APTT: aPTT: 36 seconds (ref 24–36)

## 2022-04-26 LAB — PREALBUMIN: Prealbumin: 15 mg/dL — ABNORMAL LOW (ref 18–38)

## 2022-04-26 MED ORDER — ONDANSETRON HCL 4 MG/2ML IJ SOLN
4.0000 mg | Freq: Four times a day (QID) | INTRAMUSCULAR | Status: DC | PRN
Start: 2022-04-26 — End: 2022-04-27
  Administered 2022-04-26 – 2022-04-27 (×2): 4 mg via INTRAVENOUS
  Filled 2022-04-26 (×2): qty 2

## 2022-04-26 MED ORDER — SODIUM CHLORIDE 0.9 % IV SOLN: INTRAVENOUS | Status: DC

## 2022-04-26 MED ORDER — INSULIN ASPART 100 UNIT/ML IJ SOLN
0.0000 [IU] | Freq: Four times a day (QID) | INTRAMUSCULAR | Status: DC
  Administered 2022-04-28: 2 [IU] via SUBCUTANEOUS
  Administered 2022-04-28 (×2): 1 [IU] via SUBCUTANEOUS
  Administered 2022-04-28: 2 [IU] via SUBCUTANEOUS
  Administered 2022-04-29 (×2): 1 [IU] via SUBCUTANEOUS
  Administered 2022-04-30: 3 [IU] via SUBCUTANEOUS
  Administered 2022-04-30: 1 [IU] via SUBCUTANEOUS
  Administered 2022-04-30: 3 [IU] via SUBCUTANEOUS
  Administered 2022-04-30: 1 [IU] via SUBCUTANEOUS
  Administered 2022-05-01 (×2): 3 [IU] via SUBCUTANEOUS

## 2022-04-26 MED ORDER — HEPARIN (PORCINE) 25000 UT/250ML-% IV SOLN
950.0000 [IU]/h | INTRAVENOUS | Status: DC
Start: 2022-04-26 — End: 2022-04-27
  Administered 2022-04-26: 1100 [IU]/h via INTRAVENOUS
  Filled 2022-04-26: qty 250

## 2022-04-26 MED ORDER — CHLORHEXIDINE GLUCONATE CLOTH 2 % EX PADS
6.0000 | MEDICATED_PAD | Freq: Every day | CUTANEOUS | Status: DC
  Administered 2022-04-26 – 2022-05-03 (×8): 6 via TOPICAL

## 2022-04-26 MED ORDER — SODIUM CHLORIDE 0.9% FLUSH: 10.0000 mL | INTRAVENOUS | Status: DC | PRN

## 2022-04-26 MED ORDER — DEXTROSE-NACL 5-0.9 % IV SOLN
INTRAVENOUS | Status: AC
Start: 2022-04-26 — End: 2022-04-27

## 2022-04-26 MED ORDER — ONDANSETRON HCL 4 MG/2ML IJ SOLN
4.0000 mg | Freq: Once | INTRAMUSCULAR | Status: AC
  Administered 2022-04-26: 4 mg via INTRAVENOUS
  Filled 2022-04-26: qty 2

## 2022-04-26 NOTE — Plan of Care (Signed)
  Problem: Nutrition: Goal: Adequate nutrition will be maintained Outcome: Progressing   Problem: Pain Managment: Goal: General experience of comfort will improve Outcome: Progressing   Problem: Safety: Goal: Ability to remain free from injury will improve Outcome: Progressing   

## 2022-04-26 NOTE — Progress Notes (Signed)
Pt arrived on floor from ED. This nurse offered to try to place NG tube. Pt refused. MD aware. Will continue to monitor.

## 2022-04-26 NOTE — Progress Notes (Signed)
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  Symptoms remind her of her prior small bowel obstruction.  Imaging in the ED showed recurrent small bowel obstruction and apparently recurrence of her malignancy.  Most recent CT scan in April showed remission.  She was admitted to the hospital, an NG tube failed to be placed.  General surgery consulted  Subjective / 24h Interval events: Had an episode of emesis overnight, still complains of abdominal cramping this morning but a little bit better  Assesement and Plan: Principal Problem:   Small bowel obstruction (HCC) Active Problems:   Neoplasm of peritoneum   Hypokalemia   History of pulmonary embolus (PE)   Essential hypertension   Chronic diastolic CHF (congestive heart failure) (HCC)   CKD (chronic kidney disease) stage 2, GFR 60-89 ml/min   Principal problem Small bowel obstruction in the setting of underlying progressive malignancy-CT scan of the abdomen pelvis on admission showed peritoneal wall thickening and implants suggesting progressive peritoneal carcinomatosis.  General surgery and oncology following, appreciate input.  Active problems Hypokalemia-replaced on admission, normalized this morning.  Continue to monitor  Mild anemia-macrocytic, likely in the setting of underlying malignancy.  No bleeding, monitor  History of PE-switched to heparin today, she is strict n.p.o.  Essential hypertension-she is normotensive, hold oral home  agents  Chronic diastolic CHF-hold home diuretics, appears euvolemic, monitor   Scheduled Meds:  Chlorhexidine Gluconate Cloth  6 each Topical Daily   Continuous Infusions: PRN Meds:.acetaminophen **OR** acetaminophen, HYDROcodone-acetaminophen, HYDROmorphone (DILAUDID) injection, sodium chloride flush  Diet Orders (From admission, onward)     Start     Ordered   04/25/22 2143  Diet NPO time specified  Diet effective now        04/25/22 2142            DVT prophylaxis: SCDs Start: 04/25/22 2143   Lab Results  Component Value Date   PLT 265 04/26/2022      Code Status: Full Code  Family Communication: no family at bedside  Status is: Inpatient  Remains inpatient appropriate because: Persistent small bowel obstruction  Level of care: Telemetry  Consultants:  General surgery Oncology  Objective: Vitals:   04/25/22 2143 04/26/22 0152 04/26/22 0600 04/26/22 1010  BP: (!) 134/91 127/74 131/78 133/87  Pulse: 87 79 79 81  Resp: '16 18 18 17  '$ Temp: 97.7 F (36.5 C) 98 F (36.7 C) 97.7 F (36.5 C) 97.8 F (36.6 C)  TempSrc: Oral Oral Oral Oral  SpO2: 100% 99% 98% 100%  Weight:      Height:        Intake/Output Summary (Last 24 hours) at 04/26/2022 1137 Last data filed at 04/26/2022 0800 Gross per 24 hour  Intake 1895.31 ml  Output 350 ml  Net 1545.31 ml   Wt Readings from Last 3 Encounters:  04/25/22 83.5 kg  04/25/22 83.5 kg  04/15/22 86.3 kg    Examination: Constitutional: NAD Eyes: no scleral icterus ENMT: Mucous membranes are moist.  Neck: normal, supple Respiratory: clear to  auscultation bilaterally, no wheezing, no crackles.  Cardiovascular: Regular rate and rhythm, no murmurs / rubs / gallops.  Abdomen: non distended, no tenderness. Bowel sounds positive.  Musculoskeletal: no clubbing / cyanosis.  Skin: no rashes Neurologic: non focal  Data Reviewed: I have independently reviewed following labs and imaging studies   CBC Recent Labs   Lab 04/25/22 1314 04/25/22 1604 04/26/22 0530  WBC 8.1 7.6 8.5  HGB 12.4 11.9* 10.9*  HCT 34.2* 33.6* 31.4*  PLT 338 291 265  MCV 106.5* 109.4* 111.3*  MCH 38.6* 38.8* 38.7*  MCHC 36.3* 35.4 34.7  RDW 13.9 14.3 14.4  LYMPHSABS 1.4 1.3  --   MONOABS 0.7 0.6  --   EOSABS 0.1 0.0  --   BASOSABS 0.0 0.0  --     Recent Labs  Lab 04/25/22 1314 04/25/22 1604 04/26/22 0530  NA 136 138 137  K 3.5 3.2* 3.9  CL 99 102 105  CO2 '26 24 23  '$ GLUCOSE 127* 118* 112*  BUN '16 15 13  '$ CREATININE 1.19* 1.14* 0.89  CALCIUM 9.7 9.4 9.1  AST 19 17 13*  ALT '9 12 11  '$ ALKPHOS 91 76 67  BILITOT 0.8 1.0 0.7  ALBUMIN 4.4 3.8 3.3*  MG  --  1.9  --     ------------------------------------------------------------------------------------------------------------------ No results for input(s): "CHOL", "HDL", "LDLCALC", "TRIG", "CHOLHDL", "LDLDIRECT" in the last 72 hours.  Lab Results  Component Value Date   HGBA1C 6.4 (H) 05/05/2021   ------------------------------------------------------------------------------------------------------------------ No results for input(s): "TSH", "T4TOTAL", "T3FREE", "THYROIDAB" in the last 72 hours.  Invalid input(s): "FREET3"  Cardiac Enzymes No results for input(s): "CKMB", "TROPONINI", "MYOGLOBIN" in the last 168 hours.  Invalid input(s): "CK" ------------------------------------------------------------------------------------------------------------------ No results found for: "BNP"  CBG: No results for input(s): "GLUCAP" in the last 168 hours.  No results found for this or any previous visit (from the past 240 hour(s)).   Radiology Studies: DG Abd 1 View  Result Date: 04/26/2022 CLINICAL DATA:  Small bowel obstruction EXAM: ABDOMEN - 1 VIEW COMPARISON:  CT and radiography from yesterday FINDINGS: Unchanged gas filled loops of bowel in the central abdomen measuring up to 3.1 cm. Ascites seen to depressed the bladder. No evidence of pneumoperitoneum.  IMPRESSION: Partial small bowel obstruction by recent CT. Unchanged bowel gas pattern. Electronically Signed   By: Jorje Guild M.D.   On: 04/26/2022 05:52   CT ABDOMEN PELVIS W CONTRAST  Result Date: 04/25/2022 CLINICAL DATA:  Bowel obstruction suspected. Vomiting. Patient presents from the cancer center. EXAM: CT ABDOMEN AND PELVIS WITH CONTRAST TECHNIQUE: Multidetector CT imaging of the abdomen and pelvis was performed using the standard protocol following bolus administration of intravenous contrast. RADIATION DOSE REDUCTION: This exam was performed according to the departmental dose-optimization program which includes automated exposure control, adjustment of the mA and/or kV according to patient size and/or use of iterative reconstruction technique. CONTRAST:  170m OMNIPAQUE IOHEXOL 300 MG/ML  SOLN COMPARISON:  01/08/2022 FINDINGS: Lower chest: Mild dependent atelectasis in the lung bases. Small esophageal hiatal hernia. Hepatobiliary: No focal liver abnormality is seen. No gallstones, gallbladder wall thickening, or biliary dilatation. Pancreas: Unremarkable. No pancreatic ductal dilatation or surrounding inflammatory changes. Spleen: Normal in size without focal abnormality. Adrenals/Urinary Tract: Adrenal glands are unremarkable. Kidneys are normal, without renal calculi, focal lesion, or hydronephrosis. Bladder is unremarkable. Stomach/Bowel: Mild distention of small-bowel loops with gas and fluid. Mild small bowel wall thickening is suggested. Fecalization of small bowel contents. Decompression of the terminal ileum with transition  zone suggested in the right lower quadrant. Decompressed stool-filled colon. Appendix is not identified. Vascular/Lymphatic: Aortic atherosclerosis. No enlarged abdominal or pelvic lymph nodes. Mesenteric vessels appear patent. Reproductive: Status post hysterectomy. No adnexal masses. Other: Moderate diffuse free fluid throughout the abdomen and pelvis. Nodular  stranding in the mesentery and omentum. Peritoneal wall thickening. Changes are consistent with peritoneal carcinomatosis. Appearances have progressed since the previous study. No free air. Abdominal wall musculature appears intact. Musculoskeletal: Degenerative changes.  No acute bony abnormalities. IMPRESSION: 1. Moderate free fluid throughout the abdomen and pelvis with peritoneal wall thickening and implant suggesting peritoneal carcinomatosis, progressing since prior study. 2. Mildly dilated small bowel with wall thickening and fecalization of contents. Decompressed terminal ileum with transition zone in the right lower quadrant. Appearance is consistent with small-bowel obstruction. 3. Small esophageal hiatal hernia. 4. Aortic atherosclerosis. Electronically Signed   By: Lucienne Capers M.D.   On: 04/25/2022 17:23   DG Abd 2 Views  Result Date: 04/25/2022 CLINICAL DATA:  recurrent abdominal pain and nausea, rule out bowel obstruction EXAM: ABDOMEN - 2 VIEW COMPARISON:  CT 01/08/2022 FINDINGS: There are multiple mildly dilated loops of small bowel with air-fluid level. Mild-to-moderate stool burden. No radiopaque calculi overlie the kidneys. IMPRESSION: Multiple mildly dilated loops of small bowel with air-fluid levels. Findings could potentially represent a small-bowel obstruction. Consider CT. These results will be called to the ordering clinician or representative by the Radiologist Assistant, and communication documented in the PACS or [employer]. Electronically Signed   By: Maurine Simmering M.D.   On: 04/25/2022 13:15     Marzetta Board, MD, PhD [facility]  Between 7 am - 7 pm I am available, please contact me via Amion (for emergencies) or Securechat (non urgent messages)  Between 7 pm - 7 am I am not available, please contact night coverage MD/APP via Amion

## 2022-04-26 NOTE — Plan of Care (Signed)

## 2022-04-26 NOTE — Progress Notes (Signed)
Initial Nutrition Assessment  INTERVENTION:   -Monitor for ability to have diet advanced  -If diet unable to be advanced within 5-7 days, recommend initiation of TPN   NUTRITION DIAGNOSIS:   Increased nutrient needs related to cancer and cancer related treatments as evidenced by estimated needs.  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 Admitted for SBO.  Patient reports she has been unable to eat anything for 4-5 days. States she couldn't even swallow anything. Prior to this pt was eating smaller amounts at meal times, still tried to eat 3 meals a day. Not a big meat eater, will eat eggs and beans.  Pt with history of SBO in 2022 in which she needed TPN at that time.  Will continue to monitor for diet advancement.  Per weight records, pt has lost 5 lbs since 7/24.   Medications reviewed.  Labs reviewed.  NUTRITION - FOCUSED PHYSICAL EXAM:  Flowsheet Row Most Recent Value  Orbital Region No depletion  Upper Arm Region No depletion  Thoracic and Lumbar Region Unable to assess  Buccal Region No depletion  Temple Region Mild depletion  Clavicle Bone Region Mild depletion  Clavicle and Acromion Bone Region Mild depletion  Scapular Bone Region No depletion  Dorsal Hand No depletion  Patellar Region No depletion  Anterior Thigh Region No depletion  Posterior Calf Region No depletion  Edema (RD Assessment)  None  Hair Reviewed  Eyes Reviewed  Mouth Reviewed  Skin Reviewed       Diet Order:   Diet Order             Diet NPO time specified  Diet effective now                   EDUCATION NEEDS:   No education needs have been identified at this time  Skin:  Skin Assessment: Reviewed RN Assessment  Last BM:  8/2  Height:   Ht Readings from Last 1 Encounters:  04/25/22 '5\' 7"'$  (1.702 m)    Weight:   Wt Readings from Last 1 Encounters:  04/25/22 83.5 kg    BMI:  Body mass index is 28.82 kg/m.  Estimated Nutritional Needs:   Kcal:  1900-2100  Protein:  90-100g  Fluid:  2L/day  Clayton Bibles, MS, RD, LDN Inpatient Clinical Dietitian Contact information available via Amion

## 2022-04-26 NOTE — Plan of Care (Signed)
  Problem: Education: Goal: Knowledge of General Education information will improve Description Including pain rating scale, medication(s)/side effects and non-pharmacologic comfort measures Outcome: Progressing   

## 2022-04-26 NOTE — Progress Notes (Signed)
PHARMACY - TOTAL PARENTERAL NUTRITION CONSULT NOTE   Indication: Small bowel obstruction  Patient Measurements: Height: '5\' 7"'$  (170.2 cm) Weight: 83.5 kg (184 lb) IBW/kg (Calculated) : 61.6 TPN AdjBW (KG): 67.1 Body mass index is 28.82 kg/m. Usual Weight:   Assessment: Pharmacy is consulted to dose TPN for this 68 yoF that was admitted on 8/3 with small bowel obstruction - per [facility], very likely a malignant obstruction secondary to progression of her known carcinomatosis, and a surgical intervention is not likely to help her. NG decompression was not successful.  She will be at high risk for refeeding due to very limited / No oral intake for 4-5 days prior to admission.  Glucose / Insulin: Glucose 112, no hx DM noted and no prior to admission diabetes medications.  Electrolytes: WNL, hypokalemia on admission corrected Renal: WNL Hepatic: WNL Intake / Output; MIVF: IVF NS @ 50 ml/hr, holding home lasix for CHF GI Imaging: GI Surgeries / Procedures:   Central access: Implanted port TPN start date: 8/5  Nutritional Goals: Goal TPN rate is 90 mL/hr (provides 97 g of protein and 2030 kcals per day) - Travasol 10% 45g/L - Dextrose 15% - SMOFlipid 25 g/L  RD Assessment:  (8/4) Estimated Needs Total Energy Estimated Needs: 1900-2100 Total Protein Estimated Needs: 90-100g Total Fluid Estimated Needs: 2L/day  Current Nutrition:  NPO  Plan:  TPN ordered after cut-off time and will be ordered tomorrow to begin at 18:00.  IV fluids: D5% 0.9% NaCl at 50 ml/hr Initiate sensitive SSI Q6h and adjust as needed  Monitor TPN labs on Mon/Thurs, and Sat/Sun this weekend.   Gretta Arab PharmD, BCPS Clinical Pharmacist [facility] main pharmacy [phone] 04/26/2022 5:08 PM

## 2022-04-26 NOTE — Progress Notes (Signed)
24 hour chart audit completed 

## 2022-04-26 NOTE — Progress Notes (Signed)
Progress Note: General Surgery Service   Chief Complaint/Subjective: Feeling slightly better this AM  Objective: Vital signs in last 24 hours: Temp:  [97.7 F (36.5 C)-98.1 F (36.7 C)] 97.8 F (36.6 C) (08/04 1010) Pulse Rate:  [76-101] 81 (08/04 1010) Resp:  [12-21] 17 (08/04 1010) BP: (100-160)/(74-96) 133/87 (08/04 1010) SpO2:  [96 %-100 %] 100 % (08/04 1010) Weight:  [83.5 kg] 83.5 kg (08/03 1500) Last BM Date : 04/24/22  Intake/Output from previous day: 08/03 0701 - 08/04 0700 In: 1774.7 [I.V.:377; IV Piggyback:1397.6] Out: 350 [Urine:350] Intake/Output this shift: Total I/O In: 120.6 [I.V.:120.6] Out: -   Constitutional: NAD; conversant; no deformities Eyes: Moist conjunctiva; no lid lag; anicteric; PERRL Neck: Trachea midline; no thyromegaly Lungs: Normal respiratory effort; no tactile fremitus CV: RRR; no palpable thrills; no pitting edema GI: Abd soft, mild tenderness; no palpable hepatosplenomegaly MSK: Normal range of motion of extremities; no clubbing/cyanosis Psychiatric: Appropriate affect; alert and oriented x3 Lymphatic: No palpable cervical or axillary lymphadenopathy  Lab Results: CBC  Recent Labs    04/25/22 1604 04/26/22 0530  WBC 7.6 8.5  HGB 11.9* 10.9*  HCT 33.6* 31.4*  PLT 291 265   BMET Recent Labs    04/25/22 1604 04/26/22 0530  NA 138 137  K 3.2* 3.9  CL 102 105  CO2 24 23  GLUCOSE 118* 112*  BUN 15 13  CREATININE 1.14* 0.89  CALCIUM 9.4 9.1   PT/INR No results for input(s): "LABPROT", "INR" in the last 72 hours. ABG No results for input(s): "PHART", "HCO3" in the last 72 hours.  Invalid input(s): "PCO2", "PO2"  Anti-infectives: Anti-infectives (From admission, onward)    None       Medications: Scheduled Meds:  Chlorhexidine Gluconate Cloth  6 each Topical Daily   Continuous Infusions: PRN Meds:.acetaminophen **OR** acetaminophen, HYDROcodone-acetaminophen, HYDROmorphone (DILAUDID) injection, sodium  chloride flush  Assessment/Plan:   Malignant obstruction with ascites.  No surgical options for this patient.  Venting G tube is not a good idea with ascites, likely would not heal.  Surgery team will be available as needed.    Felicie Morn, MD General, Bariatric and Minimally Invasive Surgery [place]

## 2022-04-26 NOTE — Progress Notes (Addendum)
HEMATOLOGY-ONCOLOGY PROGRESS NOTE  ASSESSMENT AND PLAN: 1.  Small bowel obstruction 2.  Nausea and vomiting secondary to #1 3.  Signet ring cell adenocarcinoma with carcinomatosis 4.  Macrocytic anemia 5.  History of PE diagnosed August 2022 6.  Hypertension 7.  Chronic diastolic CHF 8.  CKD  -Imaging results show small bowel obstruction and concern for disease progression.  We will have further discussion regarding discontinuation of Xeloda and considering systemic chemotherapy. -General surgery following.  Surgery not recommended.  Unable to tolerate NG tube placement.  Continue antiemetics and pain medication.  General surgery considering oral Gastrografin challenge. -Monitor hemoglobin closely.  No need for transfusion at this time. -Management of chronic medical conditions per hospitalist.  Brenda Stewart  SUBJECTIVE: Brenda Stewart was seen in our office yesterday and reported new stomach pain and vomiting.  Abdominal x-ray obtained concerning for small bowel obstruction.  She was referred to the emergency department for further evaluation and admission.  CT abdomen/pelvis showed moderate free fluid throughout the abdomen and pelvis with peritoneal wall thickening and implant suggesting peritoneal carcinomatosis, progressing since prior study, mildly dilated small bowel with wall thickening and fecalization of contents, depressed terminal ileum with transition zone in the right lower quadrant with appearance most consistent with small bowel obstruction.  She was seen by general surgery who recommended medical management for now.  NG tube placement was tried, but not successful.  This morning, the patient reports improvement in her abdominal pain with pain medication.  Says abdomen feels bloated this morning. She continues to have intermittent nausea and vomiting.  Last bowel movement was 8/2.  Denies flatus.  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   Suspected distal small bowel obstruction, with probable transition point in right lower quadrant.   Aortic Atherosclerosis (ICD10-I70.0).   04/22/2021 Imaging   US Abdomen  IMPRESSION: Small volume of lower abdominopelvic ascites.   04/24/2021 Pathology Results   Cytology  Specimen Submitted:  A. ASCITES, PARACENTESIS:   FINAL MICROSCOPIC DIAGNOSIS:  - Atypical cells present  DIAGNOSTIC COMMENTS:  Immunohistochemical stains show that the rare, mildly atypical cells are positive for calretinin and D2-40; while they are negative for PAX8, ER and MOC-31.  This immunoprofile is consistent with reactive mesothelial cells.    04/25/2021 Initial Diagnosis   Primary peritoneal carcinomatosis ([place])   04/29/2021 Imaging   CT AP  IMPRESSION: 1. Moderate ascites, mild peritoneal thickening and diffuse omental thickening/caking and smaller focal fluid/cystic areas within the RIGHT abdomen and LOWER pelvis, compatible with peritoneal carcinomatosis. Mild mesenteric stranding is nonspecific. 2. Small subserosal implants along the INFERIOR RIGHT liver. 3. Small LEFT pleural effusion and trace RIGHT pleural effusion with LEFT LOWER lung atelectasis. 4. Aortic Atherosclerosis (ICD10-I70.0).   04/30/2021 Pathology Results   FINAL MICROSCOPIC DIAGNOSIS:   A. 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   ADDENDUM:  By  immunohistochemistry, HER-2 is  EQUIVOCAL (2+).    FLOURESCENCE IN-SITU HYBRIDIZATION RESULTS:  GROUP 5:   HER2 **NEGATIVE**    05/04/2021 -  Chemotherapy   Patient is on Treatment Plan : GASTRIC FOLFOX q14d x 12 cycles     05/04/2021 Imaging   CT Chest  IMPRESSION: 1. Suspected segmental filling defects in the anterior segment right upper lobe and apical segment right upper lobe compatible with small pulmonary emboli. Clot burden appears small, although please note that today's examination was not a dedicated CT angiogram and as such might overestimate or underestimate pulmonary embolus. Positive for acute PE with CT evidence of right heart strain (RV/LV Ratio = 1.1) consistent with at least submassive (intermediate risk) PE. The presence of right heart strain has been associated with an increased risk of morbidity and mortality. Please refer to the "PE Focused" order set in EPIC. 2. No compelling findings of metastatic disease to the chest. There is a small left pleural effusion, but no definite enhancement along the pleura to suggest malignant or exudative etiology at this time. 3. There is some new gas in the soft tissues of the left thoracoabdominal wall, compatible subcutaneous emphysema, probably related to recent laparoscopy or other procedure. 4. Hypodense thyroid nodules up to 2.1 cm in diameter. Recommend thyroid US (ref: J Am Coll Radiol. 2015 Feb;12(2): 143-50). 5. Left upper quadrant ascites, including fluid in the lesser sac. This is reduced compared to the prior CT abdomen of 04/29/2021. 6. Stable appearance of periportal edema and hypodensities along the posterior margin of the right hepatic lobe.   05/07/2021 Pathology Results   FINAL MICROSCOPIC DIAGNOSIS:   A. DUODENUM, BULB, BIOPSY:  - Polypoid duodenal mucosa with chronic inflammation, foveolar  metaplasia and reactive changes  - Negative for dysplasia or malignancy   Cytology: Specimen Submitted:  A. ESOPHAGUS,  BRUSHING:   FINAL MICROSCOPIC DIAGNOSIS:  - Benign reactive/reparative changes    05/16/2021 Cancer Staging   Staging form: Soft Tissue Sarcoma of the Abdomen and Thoracic Visceral Organs, AJCC 8th Edition - Clinical: cTX, cN0, pM1 - Signed by [name], MD on 05/16/2021   08/10/2021 Imaging   CT CAP IMPRESSION: 1. Subserosal implants along the inferior right liver are no longer apparent or decreased in size when compared to prior exam. 2. Interval resolution of abdominal ascites. Decreased peritoneal thickening and stranding. 3. Interval resolution of small left pleural effusion. 4.  Aortic Atherosclerosis (ICD10-I70.0).        REVIEW OF SYSTEMS:   Review of Systems  Constitutional:  Negative for chills and fever.  HENT: Negative.    Respiratory: Negative.    Cardiovascular: Negative.   Gastrointestinal:  Positive for abdominal pain, nausea and vomiting.       Abdominal pain improved with as needed pain medication  Skin: Negative.   Neurological: Negative.   Endo/Heme/Allergies: Negative.     I have reviewed the past medical history, past surgical history, social history and family history with the patient and they are unchanged from previous note.   PHYSICAL EXAMINATION: ECOG PERFORMANCE STATUS: 2 - Symptomatic, <50% confined to bed  Vitals:   04/26/22 0152 04/26/22 0600  BP: 127/74 131/78  Pulse: 79 79  Resp: 18 18  Temp: 98 F (36.7 C) 97.7 F (36.5 C)  SpO2: 99% 98%   Filed Weights   04/25/22 1500  Weight: 83.5 kg    Intake/Output from previous day: 08/03 0701 - 08/04 0700 In: 1774.7 [I.V.:377; IV Piggyback:1397.6] Out: 350 [Urine:350]  Physical Exam Vitals reviewed.  Constitutional:      General: She is not in acute distress. HENT:     Head: Normocephalic.  Eyes:     General: No scleral icterus.    Conjunctiva/sclera: Conjunctivae normal.  Cardiovascular:     Rate and Rhythm: Normal rate.  Pulmonary:     Effort: Pulmonary effort is normal.  No respiratory distress.  Abdominal:     General: There is no distension.     Palpations: Abdomen is soft.  Skin:    General: Skin is warm and dry.  Neurological:     Mental Status: She is alert and oriented to person, place, and time.     LABORATORY DATA:  I have reviewed the data as listed    Latest Ref Rng & Units 04/26/2022    5:30 AM 04/25/2022    4:04 PM 04/25/2022    1:14 PM  CMP  Glucose 70 - 99 mg/dL 112  118  127   BUN 8 - 23 mg/dL '13  15  16   ' Creatinine 0.44 - 1.00 mg/dL 0.89  1.14  1.19   Sodium 135 - 145 mmol/L 137  138  136   Potassium 3.5 - 5.1 mmol/L 3.9  3.2  3.5   Chloride 98 - 111 mmol/L 105  102  99   CO2 22 - 32 mmol/L '23  24  26   ' Calcium 8.9 - 10.3 mg/dL 9.1  9.4  9.7   Total Protein 6.5 - 8.1 g/dL 6.9  8.0  8.4   Total Bilirubin 0.3 - 1.2 mg/dL 0.7  1.0  0.8   Alkaline Phos 38 - 126 U/L 67  76  91   AST 15 - 41 U/L '13  17  19   ' ALT 0 - 44 U/L '11  12  9     ' Lab Results  Component Value Date   WBC 8.5 04/26/2022   HGB 10.9 (L) 04/26/2022   HCT 31.4 (L) 04/26/2022   MCV 111.3 (H) 04/26/2022   PLT 265 04/26/2022   NEUTROABS 5.5 04/25/2022    Lab Results  Component Value Date   CEA1 5.7 (H) 04/22/2021   GNF621 11 04/22/2021    DG Abd 1 View  Result Date: 04/26/2022 CLINICAL DATA:  Small bowel obstruction EXAM: ABDOMEN - 1 VIEW COMPARISON:  CT and radiography from yesterday FINDINGS: Unchanged gas filled loops of bowel in the central abdomen measuring up to 3.1 cm. Ascites seen to depressed the bladder. No evidence of pneumoperitoneum. IMPRESSION: Partial small bowel obstruction by recent CT. Unchanged bowel gas pattern. Electronically Signed   By: Jorje Guild M.D.   On: 04/26/2022 05:52   CT ABDOMEN PELVIS W CONTRAST  Result Date: 04/25/2022 CLINICAL DATA:  Bowel obstruction suspected. Vomiting. Patient presents from the cancer center. EXAM: CT ABDOMEN AND PELVIS WITH CONTRAST TECHNIQUE: Multidetector CT imaging of the abdomen and pelvis was  performed using the standard protocol following bolus administration of intravenous contrast. RADIATION DOSE REDUCTION: This exam was performed according to the departmental dose-optimization program which includes automated exposure control, adjustment of the mA and/or kV according to patient size and/or use of iterative reconstruction technique. CONTRAST:  15m OMNIPAQUE IOHEXOL 300 MG/ML  SOLN COMPARISON:  01/08/2022 FINDINGS: Lower chest: Mild dependent atelectasis in the lung bases. Small esophageal hiatal hernia. Hepatobiliary: No focal liver abnormality is seen. No gallstones, gallbladder wall thickening, or biliary dilatation. Pancreas: Unremarkable. No pancreatic ductal dilatation or surrounding inflammatory changes. Spleen: Normal in size without focal abnormality. Adrenals/Urinary  Tract: Adrenal glands are unremarkable. Kidneys are normal, without renal calculi, focal lesion, or hydronephrosis. Bladder is unremarkable. Stomach/Bowel: Mild distention of small-bowel loops with gas and fluid. Mild small bowel wall thickening is suggested. Fecalization of small bowel contents. Decompression of the terminal ileum with transition zone suggested in the right lower quadrant. Decompressed stool-filled colon. Appendix is not identified. Vascular/Lymphatic: Aortic atherosclerosis. No enlarged abdominal or pelvic lymph nodes. Mesenteric vessels appear patent. Reproductive: Status post hysterectomy. No adnexal masses. Other: Moderate diffuse free fluid throughout the abdomen and pelvis. Nodular stranding in the mesentery and omentum. Peritoneal wall thickening. Changes are consistent with peritoneal carcinomatosis. Appearances have progressed since the previous study. No free air. Abdominal wall musculature appears intact. Musculoskeletal: Degenerative changes.  No acute bony abnormalities. IMPRESSION: 1. Moderate free fluid throughout the abdomen and pelvis with peritoneal wall thickening and implant suggesting  peritoneal carcinomatosis, progressing since prior study. 2. Mildly dilated small bowel with wall thickening and fecalization of contents. Decompressed terminal ileum with transition zone in the right lower quadrant. Appearance is consistent with small-bowel obstruction. 3. Small esophageal hiatal hernia. 4. Aortic atherosclerosis. Electronically Signed   By: Lucienne Capers M.D.   On: 04/25/2022 17:23   DG Abd 2 Views  Result Date: 04/25/2022 CLINICAL DATA:  recurrent abdominal pain and nausea, rule out bowel obstruction EXAM: ABDOMEN - 2 VIEW COMPARISON:  CT 01/08/2022 FINDINGS: There are multiple mildly dilated loops of small bowel with air-fluid level. Mild-to-moderate stool burden. No radiopaque calculi overlie the kidneys. IMPRESSION: Multiple mildly dilated loops of small bowel with air-fluid levels. Findings could potentially represent a small-bowel obstruction. Consider CT. These results will be called to the ordering clinician or representative by the Radiologist Assistant, and communication documented in the PACS or [employer]. Electronically Signed   By: Maurine Simmering M.D.   On: 04/25/2022 13:15     Future Appointments  Date Time Provider [place]  05/28/2022  9:00 AM [facility] None  05/28/2022  9:30 AM Alla Feeling, NP [facility] None      LOS: 1 day   Addendum  I have seen the patient, examined her. I agree with the assessment and and plan and have edited the notes.   I reviewed CT scan from yesterday, which showed cancer progression in peritoneum, and moderate ascites.  Her small bowel obstruction is related to her peritoneal metastasis, which she had when she was diagnosed initially.  I agree with surgery, she is not a candidate for surgery, or feeding tube.  I will restart her on TPN, which she had before for prolonged.  Time when she was diagnosed with cancer.  She previously responded very well to FOLFOX, cancer progressed lately changed to  maintenance therapy capecitabine alone.  I plan to restart FOLFOX after hospital discharge.  Continue supportive care for pain in nausea management.  This symptom has overall improved with NPO AND bowel rest. I will f/u.  [name] MD 04/26/2022

## 2022-04-26 NOTE — Progress Notes (Signed)
ANTICOAGULATION CONSULT NOTE - Initial Consult  Pharmacy Consult for IV heparin Indication: Hx PE  No Known Allergies  Patient Measurements: Height: '5\' 7"'$  (170.2 cm) Weight: 83.5 kg (184 lb) IBW/kg (Calculated) : 61.6 Heparin Dosing Weight: 79 kg  Vital Signs: Temp: 97.8 F (36.6 C) (08/04 1010) Temp Source: Oral (08/04 1010) BP: 133/87 (08/04 1010) Pulse Rate: 81 (08/04 1010)  Labs: Recent Labs    04/25/22 1314 04/25/22 1604 04/26/22 0530  HGB 12.4 11.9* 10.9*  HCT 34.2* 33.6* 31.4*  PLT 338 291 265  CREATININE 1.19* 1.14* 0.89  CKTOTAL  --  61  --     Estimated Creatinine Clearance: 66.3 mL/min (by C-G formula based on SCr of 0.89 mg/dL).   Medical History: Past Medical History:  Diagnosis Date   Acid reflux    Hypertension     Medications:  Medications Prior to Admission  Medication Sig Dispense Refill Last Dose   apixaban (ELIQUIS) 5 MG TABS tablet Take 1 tablet (5 mg total) by mouth 2 (two) times daily. 60 tablet 2 04/24/2022 at 2000   aspirin EC 81 MG tablet Take 81 mg by mouth daily. Swallow whole.   04/24/2022   capecitabine (XELODA) 500 MG tablet Take 4 tablets every 12 hours,  for 14 days on, 7 days off. Repeat every 21 days. Take after a meal (Patient taking differently: Take 2,000 mg by mouth See admin instructions. Take 2000 mg every 12 hours,  for 14 days on, 7 days off. Repeat every 21 days. Take after a meal) 112 tablet 2 04/24/2022   Cholecalciferol (VITAMIN D3) 50 MCG (2000 UT) capsule Take 2,000 Units by mouth daily.   04/24/2022   Cyanocobalamin (VITAMIN B-12 PO) Take 1 capsule by mouth daily.   04/24/2022   furosemide (LASIX) 20 MG tablet TAKE 1 TABLET BY MOUTH ONCE DAILY ON MONDAY, WEDNESDAY, AND FRIDAY. TAKE WITH POTASSIUM (Patient taking differently: Take 20 mg by mouth See admin instructions. Take 20 mg by mouth once daily on Monday, Wednesday, and Friday. Take with potassium) 30 tablet 0 04/24/2022   potassium chloride SA (KLOR-CON M20) 20 MEQ tablet  TAKE 1 TABLET BY MOUTH ONCE DAILY ON MONDAY, WEDNESDAY, AND FRIDAY. TAKE WITH LASIX (Patient taking differently: Take 20 mEq by mouth See admin instructions. Take 20 mEq once daily on Monday, Wednesday, and Friday.) 30 tablet 0 04/24/2022   prochlorperazine (COMPAZINE) 10 MG tablet Take 1 tablet (10 mg total) by mouth every 6 (six) hours as needed for nausea or vomiting. (Patient taking differently: Take 10 mg by mouth daily as needed for nausea or vomiting.) 30 tablet 2 04/24/2022   sennosides-docusate sodium (SENOKOT-S) 8.6-50 MG tablet Take 2 tablets by mouth daily as needed for constipation.   04/24/2022   vitamin C (ASCORBIC ACID) 250 MG tablet Take 250 mg by mouth daily.   04/24/2022   amLODipine (NORVASC) 10 MG tablet Take 5 mg by mouth daily. Take 1 tab by mouth daily (Hold Dose for Systolic BP Less than 638; Avoid Grapefruit or its juice) (Patient not taking: Reported on 04/26/2022)   Not Taking   lidocaine-prilocaine (EMLA) cream Apply 1 application topically as needed. (Patient not taking: Reported on 04/26/2022) 30 g 0 Not Taking   lisinopril (ZESTRIL) 40 MG tablet Take 40 mg by mouth daily. TAKE 1 TAB BY MOUTH DAILY (HOLD DOSE FOR SYSTOLIC BLOOD PRESSURE LESS THAN 100; AVOID POTASSIUM SUPPLEMENT/ARTIFICIAL SALT) (HOLD DOSE FOR SYSTOLIC BLOOD PRESSURE LESS THAN 100; AVOID POTASSIUM SUPPLEMENTS/ARTIFICIAL SALT) (Patient not  taking: Reported on 04/26/2022)   Not Taking   Scheduled:   Chlorhexidine Gluconate Cloth  6 each Topical Daily   PRN: acetaminophen **OR** acetaminophen, HYDROcodone-acetaminophen, HYDROmorphone (DILAUDID) injection, sodium chloride flush   Assessment: Brenda Stewart, Brenda Stewart, Brenda Stewart, Brenda Stewart, Brenda Stewart, Brenda Lovenox likely not optimal choice given ascites)  Baseline INR, aPTT: pending Prior  anticoagulation: Eliquis 5 mg PO bid, last dose 8/2 (unknown time)  Significant events:  Today, 04/26/2022: CBC: Hgb low and trending slowly down (may be dilutional component this early in admission); Plt WNL SCr at baseline No bleeding or infusion issues per nursing  Goal of Therapy: Heparin level 0.3-0.7 units/ml Monitor platelets by anticoagulation protocol: Yes  Plan: Heparin 1100 units/hr IV infusion; no bolus Check heparin level 8 hrs after start Daily CBC, daily heparin level once stable Monitor for signs of bleeding or thrombosis  Reuel Boom, PharmD, BCPS [phone] 04/26/2022, 12:47 PM

## 2022-04-26 NOTE — Progress Notes (Signed)
       REMOTE CROSS COVER NOTE  NAME: Brenda Stewart MRN: [id number] DOB : Jun 03, 1951    Date of Service   04/26/2022  HPI/Events of Note   Medication request received for report of emesis. ED EKG reviewed, Qtc 436  Interventions   Plan: 4 mg IV Zofran x1     This document was prepared using Dragon voice recognition software and may include unintentional dictation errors.  Neomia Glass DNP, MHA, FNP-BC Nurse Practitioner [facility] Pager [phone]

## 2022-04-27 DIAGNOSIS — K56609 Unspecified intestinal obstruction, unspecified as to partial versus complete obstruction: Secondary | ICD-10-CM | POA: Diagnosis not present

## 2022-04-27 LAB — HEPARIN LEVEL (UNFRACTIONATED): Heparin Unfractionated: 0.96 IU/mL — ABNORMAL HIGH (ref 0.30–0.70)

## 2022-04-27 LAB — COMPREHENSIVE METABOLIC PANEL
ALT: 9 U/L (ref 0–44)
AST: 12 U/L — ABNORMAL LOW (ref 15–41)
Albumin: 3.4 g/dL — ABNORMAL LOW (ref 3.5–5.0)
Alkaline Phosphatase: 69 U/L (ref 38–126)
Anion gap: 9 (ref 5–15)
BUN: 11 mg/dL (ref 8–23)
CO2: 23 mmol/L (ref 22–32)
Calcium: 9 mg/dL (ref 8.9–10.3)
Chloride: 107 mmol/L (ref 98–111)
Creatinine, Ser: 0.99 mg/dL (ref 0.44–1.00)
GFR, Estimated: >60 mL/min (ref >60)
Glucose, Bld: 128 mg/dL — ABNORMAL HIGH (ref 70–99)
Potassium: 3.4 mmol/L — ABNORMAL LOW (ref 3.5–5.1)
Sodium: 139 mmol/L (ref 135–145)
Total Bilirubin: 0.7 mg/dL (ref 0.3–1.2)
Total Protein: 7.1 g/dL (ref 6.5–8.1)

## 2022-04-27 LAB — TRIGLYCERIDES: Triglycerides: 28 mg/dL (ref <150)

## 2022-04-27 LAB — PHOSPHORUS: Phosphorus: 3.3 mg/dL (ref 2.5–4.6)

## 2022-04-27 LAB — CBC
HCT: 31.5 % — ABNORMAL LOW (ref 36.0–46.0)
Hemoglobin: 11 g/dL — ABNORMAL LOW (ref 12.0–15.0)
MCH: 38.7 pg — ABNORMAL HIGH (ref 26.0–34.0)
MCHC: 34.9 g/dL (ref 30.0–36.0)
MCV: 110.9 fL — ABNORMAL HIGH (ref 80.0–100.0)
Platelets: 276 10*3/uL (ref 150–400)
RBC: 2.84 MIL/uL — ABNORMAL LOW (ref 3.87–5.11)
RDW: 14.2 % (ref 11.5–15.5)
WBC: 8 10*3/uL (ref 4.0–10.5)
nRBC: 0 % (ref 0.0–0.2)

## 2022-04-27 LAB — MAGNESIUM: Magnesium: 1.8 mg/dL (ref 1.7–2.4)

## 2022-04-27 LAB — HIV ANTIBODY (ROUTINE TESTING W REFLEX): HIV Screen 4th Generation wRfx: NONREACTIVE

## 2022-04-27 LAB — APTT: aPTT: 123 seconds — ABNORMAL HIGH (ref 24–36)

## 2022-04-27 MED ORDER — TRAVASOL 10 % IV SOLN
INTRAVENOUS | Status: AC
  Filled 2022-04-27: qty 432

## 2022-04-27 MED ORDER — MAGNESIUM SULFATE 2 GM/50ML IV SOLN
2.0000 g | Freq: Once | INTRAVENOUS | Status: AC
  Administered 2022-04-27: 2 g via INTRAVENOUS
  Filled 2022-04-27: qty 50

## 2022-04-27 MED ORDER — PROCHLORPERAZINE EDISYLATE 10 MG/2ML IJ SOLN
5.0000 mg | INTRAMUSCULAR | Status: DC | PRN
Start: 2022-04-27 — End: 2022-05-03
  Administered 2022-04-28 – 2022-05-01 (×2): 5 mg via INTRAVENOUS
  Filled 2022-04-27 (×2): qty 2

## 2022-04-27 MED ORDER — ENOXAPARIN SODIUM 80 MG/0.8ML IJ SOSY
80.0000 mg | PREFILLED_SYRINGE | Freq: Two times a day (BID) | INTRAMUSCULAR | Status: DC
  Administered 2022-04-27 – 2022-04-29 (×5): 80 mg via SUBCUTANEOUS
  Filled 2022-04-27 (×5): qty 0.8

## 2022-04-27 MED ORDER — DEXTROSE-NACL 5-0.9 % IV SOLN
INTRAVENOUS | Status: DC
Start: 2022-04-27 — End: 2022-05-03

## 2022-04-27 MED ORDER — POTASSIUM CHLORIDE 10 MEQ/50ML IV SOLN
10.0000 meq | INTRAVENOUS | Status: AC
  Administered 2022-04-27 (×4): 10 meq via INTRAVENOUS
  Filled 2022-04-27 (×4): qty 50

## 2022-04-27 MED ORDER — ONDANSETRON 4 MG PO TBDP
4.0000 mg | ORAL_TABLET | Freq: Four times a day (QID) | ORAL | Status: DC
  Administered 2022-04-27 – 2022-04-30 (×12): 4 mg via ORAL
  Filled 2022-04-27 (×12): qty 1

## 2022-04-27 MED ORDER — PANTOPRAZOLE SODIUM 40 MG PO TBEC
40.0000 mg | DELAYED_RELEASE_TABLET | Freq: Every day | ORAL | Status: DC
  Administered 2022-04-27 – 2022-05-03 (×7): 40 mg via ORAL
  Filled 2022-04-27 (×7): qty 1

## 2022-04-27 NOTE — Progress Notes (Signed)
[place] for IV heparin Indication: Hx PE  No Known Allergies  Patient Measurements: Height: '5\' 7"'$  (170.2 cm) Weight: 83.5 kg (184 lb) IBW/kg (Calculated) : 61.6 Heparin Dosing Weight: 79 kg  Vital Signs: Temp: 98 F (36.7 C) (08/04 2052) Temp Source: Oral (08/04 2052) BP: 150/92 (08/04 2052) Pulse Rate: 83 (08/04 2052)  Labs: Recent Labs    04/25/22 1604 04/26/22 0530 04/26/22 1630 04/27/22 0259  HGB 11.9* 10.9*  --  11.0*  HCT 33.6* 31.4*  --  31.5*  PLT 291 265  --  276  APTT  --   --  36 123*  HEPARINUNFRC  --   --  0.60 0.96*  CREATININE 1.14* 0.89  --  0.99  CKTOTAL 61  --   --   --      Estimated Creatinine Clearance: 59.6 mL/min (by C-G formula based on SCr of 0.99 mg/dL).   Medical History: Past Medical History:  Diagnosis Date   Acid reflux    Hypertension     Medications:  Medications Prior to Admission  Medication Sig Dispense Refill Last Dose   apixaban (ELIQUIS) 5 MG TABS tablet Take 1 tablet (5 mg total) by mouth 2 (two) times daily. 60 tablet 2 04/24/2022 at 2000   aspirin EC 81 MG tablet Take 81 mg by mouth daily. Swallow whole.   04/24/2022   capecitabine (XELODA) 500 MG tablet Take 4 tablets every 12 hours,  for 14 days on, 7 days off. Repeat every 21 days. Take after a meal (Patient taking differently: Take 2,000 mg by mouth See admin instructions. Take 2000 mg every 12 hours,  for 14 days on, 7 days off. Repeat every 21 days. Take after a meal) 112 tablet 2 04/24/2022   Cholecalciferol (VITAMIN D3) 50 MCG (2000 UT) capsule Take 2,000 Units by mouth daily.   04/24/2022   Cyanocobalamin (VITAMIN B-12 PO) Take 1 capsule by mouth daily.   04/24/2022   furosemide (LASIX) 20 MG tablet TAKE 1 TABLET BY MOUTH ONCE DAILY ON MONDAY, WEDNESDAY, AND FRIDAY. TAKE WITH POTASSIUM (Patient taking differently: Take 20 mg by mouth See admin instructions. Take 20 mg by mouth once daily on Monday, Wednesday, and Friday. Take with  potassium) 30 tablet 0 04/24/2022   potassium chloride SA (KLOR-CON M20) 20 MEQ tablet TAKE 1 TABLET BY MOUTH ONCE DAILY ON MONDAY, WEDNESDAY, AND FRIDAY. TAKE WITH LASIX (Patient taking differently: Take 20 mEq by mouth See admin instructions. Take 20 mEq once daily on Monday, Wednesday, and Friday.) 30 tablet 0 04/24/2022   prochlorperazine (COMPAZINE) 10 MG tablet Take 1 tablet (10 mg total) by mouth every 6 (six) hours as needed for nausea or vomiting. (Patient taking differently: Take 10 mg by mouth daily as needed for nausea or vomiting.) 30 tablet 2 04/24/2022   sennosides-docusate sodium (SENOKOT-S) 8.6-50 MG tablet Take 2 tablets by mouth daily as needed for constipation.   04/24/2022   vitamin C (ASCORBIC ACID) 250 MG tablet Take 250 mg by mouth daily.   04/24/2022   amLODipine (NORVASC) 10 MG tablet Take 5 mg by mouth daily. Take 1 tab by mouth daily (Hold Dose for Systolic BP Less than 678; Avoid Grapefruit or its juice) (Patient not taking: Reported on 04/26/2022)   Not Taking   lidocaine-prilocaine (EMLA) cream Apply 1 application topically as needed. (Patient not taking: Reported on 04/26/2022) 30 g 0 Not Taking   lisinopril (ZESTRIL) 40 MG tablet Take 40 mg by  mouth daily. TAKE 1 TAB BY MOUTH DAILY (HOLD DOSE FOR SYSTOLIC BLOOD PRESSURE LESS THAN 100; AVOID POTASSIUM SUPPLEMENT/ARTIFICIAL SALT) (HOLD DOSE FOR SYSTOLIC BLOOD PRESSURE LESS THAN 100; AVOID POTASSIUM SUPPLEMENTS/ARTIFICIAL SALT) (Patient not taking: Reported on 04/26/2022)   Not Taking   Scheduled:   Chlorhexidine Gluconate Cloth  6 each Topical Daily   insulin aspart  0-9 Units Subcutaneous Q6H   PRN: acetaminophen **OR** acetaminophen, HYDROcodone-acetaminophen, HYDROmorphone (DILAUDID) injection, ondansetron (ZOFRAN) IV, sodium chloride flush   Assessment: 37 yoF with PMH PE on Eliquis PTA, CHF, CKD2, 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 Pharmacy to dose IV  heparin while NPO. (No bleeding or planned procedures, but Lovenox likely not optimal choice given ascites)  Baseline INR, aPTT: pending Prior anticoagulation: Eliquis 5 mg PO bid, last dose 8/2 (unknown time)  Significant events:  Today, 04/27/2022: Baseline heparin level on 8/4 elevated at 0.6, so aPTT ordered to follow heparin therapy until effects of Eliquis have worn off and heparin level and aPTT correlate aPTT ordered for 00:00 was drawn @ 02:59 = 123 sec (supratherapeutic) with heparin gtt @ 1100 units/hr Heparin level remains elevated = 0.96 CBC: Hgb 11, Pltc WNL  No bleeding or infusion issues per nursing  Goal of Therapy: aPTT 66-102 sec Heparin level 0.3-0.7 units/ml Monitor platelets by anticoagulation protocol: Yes  Plan: Decrease Heparin to 950 units/hr IV infusion Check aPTT and heparin level 8 hrs after rate reduced Continue to  monitor heparin therapy with aPTT until heparin level and aPTT levels correlate Daily CBC, daily heparin level once stable Monitor for signs of bleeding or thrombosis  Leone Haven, PharmD 04/27/2022, 4:57 AM

## 2022-04-27 NOTE — Progress Notes (Signed)
PHARMACY - TOTAL PARENTERAL NUTRITION CONSULT NOTE   Indication: Small bowel obstruction  Patient Measurements: Height: _0  (170.2 cm) Weight: 83.5 kg (184 lb) IBW/kg (Calculated) : 61.6 TPN AdjBW (KG): 67.1 Body mass index is 28.82 kg/m.  Assessment: Pharmacy is consulted to dose TPN for this 7 yoF that was admitted on 8/3 with small bowel obstruction - per [facility], very likely a malignant obstruction secondary to progression of her known carcinomatosis, and a surgical intervention is not likely to help her. NG decompression was not successful.  She will be at high risk for refeeding due to very limited / No oral intake for 4-5 days prior to admission.  Glucose / Insulin: CBG 128 - WNL -no hx DM noted and no prior to admission diabetes medications.  -Goal CBG <180 Electrolytes: K (3.4) low; all other lytes WNL including CorrCa 9.5. However, Mg on lower end of normal. Renal: SCr/BUN both WNL Hepatic: LFTs, Tbili, Alk Phos WNL, Albumin low Intake / Output; MIVF: IVF NS @ 50 ml/hr, holding home lasix for CHF GI Imaging:  -8/3 CT Abd: Consistent with SBO GI Surgeries / Procedures:   Central access: Implanted port TPN start date: 8/5  Nutritional Goals: Goal TPN rate is 90 mL/hr (provides 97 g of protein and 2030 kcals per day) - Travasol 10% 45g/L - Dextrose 15% - SMOFlipid 25 g/L  RD Assessment:  (8/4) Estimated Needs Total Energy Estimated Needs: 1900-2100 Total Protein Estimated Needs: 90-100g Total Fluid Estimated Needs: 2L/day  Current Nutrition:  NPO TPN  Plan:  Now: Mg 2 g IV once KCl 10 mEq IV x4  At 1800: Start TPN at 40 mL/hr at 1800 Will provide 43 g protein, 902 kcal Electrolytes in TPN: Standard Na 67mq/L K 588m/L Ca 70m77mL Mg 70mE70m Phos 170mm70m Cl:Ac 1:1 Add standard MVI and trace elements to TPN Continue Sensitive q6h SSI and adjust as needed Reduce MIVF to KVO at 1800 Monitor TPN labs on Mon/Thurs, recheck electrolytes with AM labs  tomorrow  Eoghan Belcher Lenis NoonrmD 04/27/22 7:59 AM

## 2022-04-27 NOTE — Progress Notes (Signed)
[facility] for enoxaparin Indication: Hx PE  No Known Allergies  Patient Measurements: Height: '5\' 7"'$  (170.2 cm) Weight: 83.5 kg (184 lb) IBW/kg (Calculated) : 61.6 Heparin Dosing Weight: 79 kg  Vital Signs: Temp: 97.8 F (36.6 C) (08/05 0610) Temp Source: Oral (08/05 0610) BP: 146/84 (08/05 0610) Pulse Rate: 79 (08/05 0610)  Labs: Recent Labs    04/25/22 1604 04/26/22 0530 04/26/22 1630 04/27/22 0259  HGB 11.9* 10.9*  --  11.0*  HCT 33.6* 31.4*  --  31.5*  PLT 291 265  --  276  APTT  --   --  36 123*  HEPARINUNFRC  --   --  0.60 0.96*  CREATININE 1.14* 0.89  --  0.99  CKTOTAL 61  --   --   --      Estimated Creatinine Clearance: 59.6 mL/min (by C-G formula based on SCr of 0.99 mg/dL).   Medical History: Past Medical History:  Diagnosis Date   Acid reflux    Hypertension     Medications:  Medications Prior to Admission  Medication Sig Dispense Refill Last Dose   apixaban (ELIQUIS) 5 MG TABS tablet Take 1 tablet (5 mg total) by mouth 2 (two) times daily. 60 tablet 2 04/24/2022 at 2000   aspirin EC 81 MG tablet Take 81 mg by mouth daily. Swallow whole.   04/24/2022   capecitabine (XELODA) 500 MG tablet Take 4 tablets every 12 hours,  for 14 days on, 7 days off. Repeat every 21 days. Take after a meal (Patient taking differently: Take 2,000 mg by mouth See admin instructions. Take 2000 mg every 12 hours,  for 14 days on, 7 days off. Repeat every 21 days. Take after a meal) 112 tablet 2 04/24/2022   Cholecalciferol (VITAMIN D3) 50 MCG (2000 UT) capsule Take 2,000 Units by mouth daily.   04/24/2022   Cyanocobalamin (VITAMIN B-12 PO) Take 1 capsule by mouth daily.   04/24/2022   furosemide (LASIX) 20 MG tablet TAKE 1 TABLET BY MOUTH ONCE DAILY ON MONDAY, WEDNESDAY, AND FRIDAY. TAKE WITH POTASSIUM (Patient taking differently: Take 20 mg by mouth See admin instructions. Take 20 mg by mouth once daily on Monday, Wednesday, and Friday. Take with  potassium) 30 tablet 0 04/24/2022   potassium chloride SA (KLOR-CON M20) 20 MEQ tablet TAKE 1 TABLET BY MOUTH ONCE DAILY ON MONDAY, WEDNESDAY, AND FRIDAY. TAKE WITH LASIX (Patient taking differently: Take 20 mEq by mouth See admin instructions. Take 20 mEq once daily on Monday, Wednesday, and Friday.) 30 tablet 0 04/24/2022   prochlorperazine (COMPAZINE) 10 MG tablet Take 1 tablet (10 mg total) by mouth every 6 (six) hours as needed for nausea or vomiting. (Patient taking differently: Take 10 mg by mouth daily as needed for nausea or vomiting.) 30 tablet 2 04/24/2022   sennosides-docusate sodium (SENOKOT-S) 8.6-50 MG tablet Take 2 tablets by mouth daily as needed for constipation.   04/24/2022   vitamin C (ASCORBIC ACID) 250 MG tablet Take 250 mg by mouth daily.   04/24/2022   amLODipine (NORVASC) 10 MG tablet Take 5 mg by mouth daily. Take 1 tab by mouth daily (Hold Dose for Systolic BP Less than 144; Avoid Grapefruit or its juice) (Patient not taking: Reported on 04/26/2022)   Not Taking   lidocaine-prilocaine (EMLA) cream Apply 1 application topically as needed. (Patient not taking: Reported on 04/26/2022) 30 g 0 Not Taking   lisinopril (ZESTRIL) 40 MG tablet Take 40 mg by mouth  daily. TAKE 1 TAB BY MOUTH DAILY (HOLD DOSE FOR SYSTOLIC BLOOD PRESSURE LESS THAN 100; AVOID POTASSIUM SUPPLEMENT/ARTIFICIAL SALT) (HOLD DOSE FOR SYSTOLIC BLOOD PRESSURE LESS THAN 100; AVOID POTASSIUM SUPPLEMENTS/ARTIFICIAL SALT) (Patient not taking: Reported on 04/26/2022)   Not Taking   Scheduled:   Chlorhexidine Gluconate Cloth  6 each Topical Daily   enoxaparin (LOVENOX) injection  80 mg Subcutaneous Q12H   insulin aspart  0-9 Units Subcutaneous Q6H   ondansetron  4 mg Oral Q6H   pantoprazole  40 mg Oral Daily   PRN: acetaminophen **OR** acetaminophen, HYDROcodone-acetaminophen, HYDROmorphone (DILAUDID) injection, prochlorperazine, sodium chloride flush   Assessment: 36 yoF with PMH PE on Eliquis PTA, CHF, CKD2, peritoneal  carcinomatosis from unclear source s/p chemo, admitted for n/v; 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 Pharmacy to dose IV heparin while NPO. (No bleeding or planned procedures, but Lovenox likely not optimal choice given ascites)  8/5: Plan to change IV heparin drip to enoxaparin   Significant events:  Today, 04/27/2022: Pt wt 83.5 kg SCr 0.99 mg/dl, CrCl 60 ml/min CBC: Hgb 11, Pltc WNL  No bleeding or infusion issues per nursing  Goal of Therapy: aPTT 66-102 sec Heparin level 0.3-0.7 units/ml Monitor platelets by anticoagulation protocol: Yes  Plan: Stop heparin drip and start enoxaparin 80 mg SQ q12h  Monitor CBC, renal function Monitor for signs of bleeding or thrombosis   Royetta Asal, PharmD, BCPS 04/27/2022 10:19 AM

## 2022-04-27 NOTE — Progress Notes (Signed)
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  Symptoms remind her of her prior small bowel obstruction.  Imaging in the ED showed recurrent small bowel obstruction and apparently recurrence of her malignancy.  Most recent CT scan in April showed remission.  She was admitted to the hospital, an NG tube failed to be placed.  General surgery consulted  Subjective / 24h Interval events: Continues to feel nauseous, mild abdominal pain.  Has not passed stool or gas.  Assesement and Plan: Principal Problem:   Small bowel obstruction (HCC) Active Problems:   Neoplasm of peritoneum   Hypokalemia   History of pulmonary embolus (PE)   Essential hypertension   Chronic diastolic CHF (congestive heart failure) (HCC)   CKD (chronic kidney disease) stage 2, GFR 60-89 ml/min   Principal problem Small bowel obstruction in the setting of underlying progressive malignancy-CT scan of the abdomen pelvis on admission showed peritoneal wall thickening and implants suggesting progressive peritoneal carcinomatosis.  General surgery and oncology following, appreciate input.  Oncology plans to start FOLFOX therapy, and start TPN as this is likely to be persistent.  She still has quite symptomatic today, try to schedule antiemetics  Active problems Hypokalemia-repleted, continue to monitor  Mild anemia-macrocytic, likely in the setting of underlying malignancy.  No bleeding, monitor  History of PE-since no procedures are  planned, switch to Lovenox  Essential hypertension-continue to hold oral agents  Chronic diastolic CHF-hold home diuretics, appears euvolemic, monitor.  Limited fluids as she is n.p.o. for small bowel obstruction   Scheduled Meds:  Chlorhexidine Gluconate Cloth  6 each Topical Daily   enoxaparin (LOVENOX) injection  80 mg Subcutaneous Q12H   insulin aspart  0-9 Units Subcutaneous Q6H   ondansetron  4 mg Oral Q6H   pantoprazole  40 mg Oral Daily   Continuous Infusions:  dextrose 5 % and 0.9% NaCl 50 mL/hr at 04/27/22 1024   dextrose 5 % and 0.9% NaCl     potassium chloride 10 mEq (04/27/22 1245)   TPN ADULT (ION)     PRN Meds:.acetaminophen **OR** acetaminophen, HYDROcodone-acetaminophen, HYDROmorphone (DILAUDID) injection, prochlorperazine, sodium chloride flush  Diet Orders (From admission, onward)     Start     Ordered   04/27/22 0912  Diet NPO time specified Except for: Ice Chips, Sips with Meds, Other (See Comments)  Diet effective now       Comments: Small water sips  Question Answer Comment  Except for Ice Chips   Except for Sips with Meds   Except for Other (See Comments)      04/27/22 0911            DVT prophylaxis: SCDs Start: 04/25/22 2143   Lab Results  Component Value Date   PLT 276 04/27/2022      Code Status: Full Code  Family Communication: no family at bedside  Status is: Inpatient  Remains inpatient appropriate because: Persistent small bowel obstruction  Level of care: Telemetry  Consultants:  General surgery Oncology  Objective: Vitals:   04/26/22  1010 04/26/22 1333 04/26/22 2052 04/27/22 0610  BP: 133/87 139/85 (!) 150/92 (!) 146/84  Pulse: 81 81 83 79  Resp: '17 16 16 16  '$ Temp: 97.8 F (36.6 C) 97.9 F (36.6 C) 98 F (36.7 C) 97.8 F (36.6 C)  TempSrc: Oral Oral Oral Oral  SpO2: 100% 99% 94% 98%  Weight:      Height:        Intake/Output Summary (Last 24 hours) at 04/27/2022 1302 Last data filed at 04/27/2022 0701 Gross  per 24 hour  Intake 769.96 ml  Output 375 ml  Net 394.96 ml    Wt Readings from Last 3 Encounters:  04/25/22 83.5 kg  04/25/22 83.5 kg  04/15/22 86.3 kg    Examination: Constitutional: NAD Eyes: lids and conjunctivae normal, no scleral icterus ENMT: mmm Neck: normal, supple Respiratory: clear to auscultation bilaterally, no wheezing, no crackles.  Cardiovascular: Regular rate and rhythm, no murmurs / rubs / gallops. No LE edema. Abdomen: Soft, mild tenderness to palpation Skin: no rashes Neurologic: no focal deficits, equal strength  Data Reviewed: I have independently reviewed following labs and imaging studies   CBC Recent Labs  Lab 04/25/22 1314 04/25/22 1604 04/26/22 0530 04/27/22 0259  WBC 8.1 7.6 8.5 8.0  HGB 12.4 11.9* 10.9* 11.0*  HCT 34.2* 33.6* 31.4* 31.5*  PLT 338 291 265 276  MCV 106.5* 109.4* 111.3* 110.9*  MCH 38.6* 38.8* 38.7* 38.7*  MCHC 36.3* 35.4 34.7 34.9  RDW 13.9 14.3 14.4 14.2  LYMPHSABS 1.4 1.3  --   --   MONOABS 0.7 0.6  --   --   EOSABS 0.1 0.0  --   --   BASOSABS 0.0 0.0  --   --      Recent Labs  Lab 04/25/22 1314 04/25/22 1604 04/26/22 0530 04/27/22 0259  NA 136 138 137 139  K 3.5 3.2* 3.9 3.4*  CL 99 102 105 107  CO2 '26 24 23 23  '$ GLUCOSE 127* 118* 112* 128*  BUN '16 15 13 11  '$ CREATININE 1.19* 1.14* 0.89 0.99  CALCIUM 9.7 9.4 9.1 9.0  AST 19 17 13* 12*  ALT '9 12 11 9  '$ ALKPHOS 91 76 67 69  BILITOT 0.8 1.0 0.7 0.7  ALBUMIN 4.4 3.8 3.3* 3.4*  MG  --  1.9  --  1.8     ------------------------------------------------------------------------------------------------------------------ Recent Labs    04/27/22 0259  TRIG 28    Lab Results  Component Value Date   HGBA1C 6.4 (H) 05/05/2021   ------------------------------------------------------------------------------------------------------------------ No results for input(s): "TSH", "T4TOTAL", "T3FREE", "THYROIDAB" in the last 72 hours.  Invalid input(s):  "FREET3"  Cardiac Enzymes No results for input(s): "CKMB", "TROPONINI", "MYOGLOBIN" in the last 168 hours.  Invalid input(s): "CK" ------------------------------------------------------------------------------------------------------------------ No results found for: "BNP"  CBG: No results for input(s): "GLUCAP" in the last 168 hours.  No results found for this or any previous visit (from the past 240 hour(s)).   Radiology Studies: No results found.   Marzetta Board, MD, PhD [facility]  Between 7 am - 7 pm I am available, please contact me via Amion (for emergencies) or Securechat (non urgent messages)  Between 7 pm - 7 am I am not available, please contact night coverage MD/APP via Amion

## 2022-04-28 ENCOUNTER — Inpatient Hospital Stay (HOSPITAL_COMMUNITY): Payer: Medicare Other

## 2022-04-28 DIAGNOSIS — K56609 Unspecified intestinal obstruction, unspecified as to partial versus complete obstruction: Secondary | ICD-10-CM | POA: Diagnosis not present

## 2022-04-28 LAB — COMPREHENSIVE METABOLIC PANEL
ALT: 10 U/L (ref 0–44)
AST: 13 U/L — ABNORMAL LOW (ref 15–41)
Albumin: 3.5 g/dL (ref 3.5–5.0)
Alkaline Phosphatase: 70 U/L (ref 38–126)
Anion gap: 7 (ref 5–15)
BUN: 11 mg/dL (ref 8–23)
CO2: 24 mmol/L (ref 22–32)
Calcium: 9.2 mg/dL (ref 8.9–10.3)
Chloride: 108 mmol/L (ref 98–111)
Creatinine, Ser: 1.13 mg/dL — ABNORMAL HIGH (ref 0.44–1.00)
GFR, Estimated: 53 mL/min — ABNORMAL LOW (ref >60)
Glucose, Bld: 162 mg/dL — ABNORMAL HIGH (ref 70–99)
Potassium: 3.8 mmol/L (ref 3.5–5.1)
Sodium: 139 mmol/L (ref 135–145)
Total Bilirubin: 0.4 mg/dL (ref 0.3–1.2)
Total Protein: 7 g/dL (ref 6.5–8.1)

## 2022-04-28 LAB — CBC
HCT: 32.3 % — ABNORMAL LOW (ref 36.0–46.0)
Hemoglobin: 11.3 g/dL — ABNORMAL LOW (ref 12.0–15.0)
MCH: 38.6 pg — ABNORMAL HIGH (ref 26.0–34.0)
MCHC: 35 g/dL (ref 30.0–36.0)
MCV: 110.2 fL — ABNORMAL HIGH (ref 80.0–100.0)
Platelets: 337 10*3/uL (ref 150–400)
RBC: 2.93 MIL/uL — ABNORMAL LOW (ref 3.87–5.11)
RDW: 14.3 % (ref 11.5–15.5)
WBC: 9 10*3/uL (ref 4.0–10.5)
nRBC: 0 % (ref 0.0–0.2)

## 2022-04-28 LAB — PHOSPHORUS: Phosphorus: 2.8 mg/dL (ref 2.5–4.6)

## 2022-04-28 LAB — MAGNESIUM: Magnesium: 2.1 mg/dL (ref 1.7–2.4)

## 2022-04-28 MED ORDER — TRAVASOL 10 % IV SOLN
INTRAVENOUS | Status: AC
  Filled 2022-04-28: qty 648

## 2022-04-28 NOTE — Progress Notes (Signed)
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  Symptoms remind her of her prior small bowel obstruction.  Imaging in the ED showed recurrent small bowel obstruction and apparently recurrence of her malignancy.  Most recent CT scan in April showed remission.  She was admitted to the hospital, an NG tube failed to be placed.  General surgery consulted  Subjective / 24h Interval events: Feeling better today, less pain, less nausea.  Has not had a bowel movement and not passing gas  Assesement and Plan: Principal Problem:   Small bowel obstruction (HCC) Active Problems:   Neoplasm of peritoneum   Hypokalemia   History of pulmonary embolus (PE)   Essential hypertension   Chronic diastolic CHF (congestive heart failure) (HCC)   CKD (chronic kidney disease) stage 2, GFR 60-89 ml/min   Principal problem Small bowel obstruction in the setting of underlying progressive malignancy-CT scan of the abdomen pelvis on admission showed peritoneal wall thickening and implants suggesting progressive peritoneal carcinomatosis.  General surgery and oncology following, appreciate input.  Oncology plans to start FOLFOX therapy, and start TPN as this is likely to be persistent.  Even though her symptoms have improved with scheduled antiemetics clinically it does not look like her bowel obstruction has improved unfortunately.  Not passing gas, not having BMs.  Repeat x-ray today  Active problems Hypokalemia-repleted, continue  to monitor, normalized today  Mild anemia-macrocytic, likely in the setting of underlying malignancy.  Hemoglobin stable today  History of PE-since no procedures are planned, switch to Lovenox.  Hopefully she will recover her GI transit to go back on orals  Essential hypertension-continue to hold oral agents  Chronic diastolic CHF-hold home diuretics, appears euvolemic, monitor.  On fluids   Scheduled Meds:  Chlorhexidine Gluconate Cloth  6 each Topical Daily   enoxaparin (LOVENOX) injection  80 mg Subcutaneous Q12H   insulin aspart  0-9 Units Subcutaneous Q6H   ondansetron  4 mg Oral Q6H   pantoprazole  40 mg Oral Daily   Continuous Infusions:  dextrose 5 % and 0.9% NaCl Stopped (04/27/22 1749)   TPN ADULT (ION) 40 mL/hr at 04/28/22 1007   TPN ADULT (ION)     PRN Meds:.acetaminophen **OR** acetaminophen, HYDROcodone-acetaminophen, HYDROmorphone (DILAUDID) injection, prochlorperazine, sodium chloride flush  Diet Orders (From admission, onward)     Start     Ordered   04/27/22 0912  Diet NPO time specified Except for: Ice Chips, Sips with Meds, Other (See Comments)  Diet effective now       Comments: Small water sips  Question Answer Comment  Except for Ice Chips   Except for Sips with Meds   Except for Other (See Comments)      04/27/22 0911            DVT prophylaxis: SCDs Start: 04/25/22 2143   Lab Results  Component Value Date   PLT 337 04/28/2022      Code Status: Full Code  Family Communication: no family at bedside  Status is: Inpatient  Remains inpatient  appropriate because: Persistent small bowel obstruction  Level of care: Telemetry  Consultants:  General surgery Oncology  Objective: Vitals:   04/27/22 0610 04/27/22 1331 04/27/22 2034 04/28/22 0534  BP: (!) 146/84 (!) 131/94 (!) 147/87 (!) 130/92  Pulse: 79 74 73 82  Resp: '16 16 16 18  '$ Temp: 97.8 F (36.6 C) 97.7 F (36.5 C) 98.3 F (36.8 C) 98.2 F (36.8 C)  TempSrc: Oral Oral Oral  Oral  SpO2: 98% 100% 100% 99%  Weight:      Height:        Intake/Output Summary (Last 24 hours) at 04/28/2022 1109 Last data filed at 04/28/2022 0901 Gross per 24 hour  Intake 1188.18 ml  Output 100 ml  Net 1088.18 ml    Wt Readings from Last 3 Encounters:  04/25/22 83.5 kg  04/25/22 83.5 kg  04/15/22 86.3 kg    Examination: Constitutional: NAD Eyes: lids and conjunctivae normal, no scleral icterus ENMT: mmm Neck: normal, supple Respiratory: clear to auscultation bilaterally, no wheezing, no crackles. Normal respiratory effort.  Cardiovascular: Regular rate and rhythm, no murmurs / rubs / gallops. No LE edema. Abdomen: soft, mild tenderness to palpation but no guarding or rebound. Skin: no rashes Neurologic: no focal deficits, equal strength  Data Reviewed: I have independently reviewed following labs and imaging studies   CBC Recent Labs  Lab 04/25/22 1314 04/25/22 1604 04/26/22 0530 04/27/22 0259 04/28/22 0635  WBC 8.1 7.6 8.5 8.0 9.0  HGB 12.4 11.9* 10.9* 11.0* 11.3*  HCT 34.2* 33.6* 31.4* 31.5* 32.3*  PLT 338 291 265 276 337  MCV 106.5* 109.4* 111.3* 110.9* 110.2*  MCH 38.6* 38.8* 38.7* 38.7* 38.6*  MCHC 36.3* 35.4 34.7 34.9 35.0  RDW 13.9 14.3 14.4 14.2 14.3  LYMPHSABS 1.4 1.3  --   --   --   MONOABS 0.7 0.6  --   --   --   EOSABS 0.1 0.0  --   --   --   BASOSABS 0.0 0.0  --   --   --      Recent Labs  Lab 04/25/22 1314 04/25/22 1604 04/26/22 0530 04/27/22 0259 04/28/22 0635  NA 136 138 137 139 139  K 3.5 3.2* 3.9 3.4* 3.8  CL 99 102 105 107 108  CO2 '26 24 23 23 24  '$ GLUCOSE 127* 118* 112* 128* 162*  BUN '16 15 13 11 11  '$ CREATININE 1.19* 1.14* 0.89 0.99 1.13*  CALCIUM 9.7 9.4 9.1 9.0 9.2  AST 19 17 13* 12* 13*  ALT '9 12 11 9 10  '$ ALKPHOS 91 76 67 69 70  BILITOT 0.8 1.0 0.7 0.7 0.4  ALBUMIN 4.4 3.8 3.3* 3.4* 3.5  MG  --  1.9  --  1.8 2.1      ------------------------------------------------------------------------------------------------------------------ Recent Labs    04/27/22 0259  TRIG 28     Lab Results  Component Value Date   HGBA1C 6.4 (H) 05/05/2021   ------------------------------------------------------------------------------------------------------------------ No results for input(s): "TSH", "T4TOTAL", "T3FREE", "THYROIDAB" in the last 72 hours.  Invalid input(s): "FREET3"  Cardiac Enzymes No results for input(s): "CKMB", "TROPONINI", "MYOGLOBIN" in the last 168 hours.  Invalid input(s): "CK" ------------------------------------------------------------------------------------------------------------------ No results found for: "BNP"  CBG: No results for input(s): "GLUCAP" in the last 168 hours.  No results found for this or any previous visit (from the past 240 hour(s)).   Radiology Studies: No results found.   Marzetta Board, MD, PhD [facility]  Between 7 am - 7 pm I am  available, please contact me via Amion (for emergencies) or Securechat (non urgent messages)  Between 7 pm - 7 am I am not available, please contact night coverage MD/APP via Amion

## 2022-04-28 NOTE — TOC Initial Note (Addendum)
Transition of Care [facility]) - Initial/Assessment Note    Patient Details  Name: Brenda Stewart MRN: [id number] Date of Birth: 10/21/1950  [facility] CM/SW Contact:    Dessa Phi, RN Phone Number: 04/28/2022, 10:25 AM  Clinical Narrative: Referral for home TPN. Spoke to dtr/patient about d/c plans-agree to HHC/TPN at home if needed. Pam w/[facility] contacted for iv infusion, initial instruction iv infusion,meds;  [facility]  HHRN-ongoing instruction,labs monitoring. Await confirmation if able to accept.  Continue to monitor for d/c plans, & orders.  -11a-Left message on [facility] weekend tel# [phone]-no response. [facility] rep Jason-await response. Await HHC orders-HHRN per protocal(labs,flush,iv med instruction), IV med script.              Expected Discharge Plan: [place] Barriers to Discharge: Continued Medical Work up   Patient Goals and CMS Choice Patient states their goals for this hospitalization and ongoing recovery are:: Home CMS Medicare.gov Compare Post Acute Care list provided to:: Patient Choice offered to / list presented to : Patient  Expected Discharge Plan and Services Expected Discharge Plan: [place]   Discharge Planning Services: CM Consult Post Acute Care Choice: [place] arrangements for the past 2 months: Single Family Home                                      Prior Living Arrangements/Services Living arrangements for the past 2 months: Single Family Home Lives with:: Adult Children Patient language and need for interpreter reviewed:: Yes Do you feel safe going back to the place where you live?: Yes      Need for Family Participation in Patient Care: Yes (Comment) Care giver support system in place?: Yes (comment)   Criminal Activity/Legal Involvement Pertinent to Current Situation/Hospitalization: No - Comment as needed  Activities of Daily Living Home Assistive  Devices/Equipment: Eyeglasses (reading glasses) ADL Screening (condition at time of admission) Patient's cognitive ability adequate to safely complete daily activities?: Yes Is the patient deaf or have difficulty hearing?: No Does the patient have difficulty seeing, even when wearing glasses/contacts?: No Does the patient have difficulty concentrating, remembering, or making decisions?: No Patient able to express need for assistance with ADLs?: Yes Does the patient have difficulty dressing or bathing?: No Independently performs ADLs?: Yes (appropriate for developmental age) Does the patient have difficulty walking or climbing stairs?: No Weakness of Legs: None Weakness of Arms/Hands: None  Permission Sought/Granted Permission sought to share information with : Case Manager Permission granted to share information with : Yes, Verbal Permission Granted  Share Information with NAME: Case Manager           Emotional Assessment Appearance:: Appears stated age Attitude/Demeanor/Rapport: Gracious Affect (typically observed): Accepting Orientation: : Oriented to Self, Oriented to Place, Oriented to  Time, Oriented to Situation Alcohol / Substance Use: Not Applicable Psych Involvement: No (comment)  Admission diagnosis:  SBO (small bowel obstruction) ([place]) [K56.609] Patient Active Problem List   Diagnosis Date Noted   History of pulmonary embolus (PE) 04/25/2022   Essential hypertension 04/25/2022   Chronic diastolic CHF (congestive heart failure) ([place]) 04/25/2022   CKD (chronic kidney disease) stage 2, GFR 60-89 ml/min 04/25/2022   Port-A-Cath in place 06/04/2021   Hypokalemia    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[place]) 04/25/2021   Protein-calorie malnutrition, severe 04/23/2021   Small bowel obstruction ([place]) 04/21/2021   AKI (acute  kidney injury) ([place]) 04/21/2021   Prolonged QT interval 04/21/2021   Unintended weight loss 04/21/2021   Nausea & vomiting 04/21/2021   Hyponatremia  04/21/2021   Neoplasm of peritoneum 04/21/2021   Leukocytosis 04/21/2021   Ascites 04/21/2021   Thrombocytosis 04/21/2021   PCP:  Pcp, No Pharmacy:   [id number] [place] [place]Phone: [phone]: [phone] [place]Phone: [phone]: [phone] [facility] [place]- [address] [place]Phone: [phone]: [phone] [facility] [place]ne [address]Phone: [phone]: [phone] [address]Phone: [phone]: [phone]    Social Determinants of Health (SDOH) Interventions    Readmission Risk Interventions    04/27/2021   10:52 AM  Readmission Risk Prevention Plan  Transportation Screening Complete  PCP or Specialist Appt within 5-7 Days Complete  Home Care Screening Complete  Medication Review (RN CM) Complete

## 2022-04-28 NOTE — Progress Notes (Signed)
PHARMACY - TOTAL PARENTERAL NUTRITION CONSULT NOTE   Indication: Small bowel obstruction  Patient Measurements: Height: _0  (170.2 cm) Weight: 83.5 kg (184 lb) IBW/kg (Calculated) : 61.6 TPN AdjBW (KG): 67.1 Body mass index is 28.82 kg/m.  Assessment: Pharmacy is consulted to dose TPN for this 15 yoF that was admitted on 8/3 with small bowel obstruction - per CCS, very likely a malignant obstruction secondary to progression of her known carcinomatosis, and a surgical intervention is not likely to help her. NG decompression was not successful.  She will be at high risk for refeeding due to very limited / No oral intake for 4-5 days prior to admission.  Glucose / Insulin: CBG 139-153 (charted on MAR) - 3 units SSI/24 hrs -no hx DM noted and no prior to admission diabetes medications.  -Goal CBG <180 Electrolytes: All WNL including CorrCa 9.6 Renal: SCr/BUN WNL Hepatic: LFTs, Tbili, Alk Phos, Alb WNL Intake / Output; MIVF: mIVF D5NS @ KVO, holding home lasix for CHF GI Imaging:  -8/3 CT Abd: Consistent with SBO GI Surgeries / Procedures:   Central access: Implanted port TPN start date: 8/5  Nutritional Goals: Goal TPN rate is 90 mL/hr (provides 97 g of protein and 2030 kcals per day) - Travasol 10% 45g/L - Dextrose 15% - SMOFlipid 25 g/L  RD Assessment:  (8/4) Estimated Needs Total Energy Estimated Needs: 1900-2100 Total Protein Estimated Needs: 90-100g Total Fluid Estimated Needs: 2L/day  Current Nutrition:  NPO TPN  Plan:  Increase TPN to 60 mL/hr at 1800 Will provide 65 g protein, 1354 kcal Electrolytes in TPN: Increase K, Phos. Decrease Ca Na 50 mEq/L K 55 mEq/L Ca 4 mEq/L Mg 5 mEq/L Phos 18 mmol/L Cl:Ac 1:1 Add standard MVI and trace elements to TPN Continue Sensitive q6h SSI and adjust as needed MIVF at [facility]. Further adjustment per MD. Monitor TPN labs on Mon/Thurs.  Lenis Noon, PharmD 04/28/22 7:10 AM

## 2022-04-28 NOTE — Progress Notes (Signed)
Brenda Stewart   DOB:June 01, 1951   AU#:[id number]   BWL#:[id number]  ONCOLOGY FOLLOW UP   Subjective: Brenda Stewart feels better overall, he is able to ambulate in the hallway well.  He has tried ice chips and some water, nausea was much better today, no vomiting, she has not had a bowel movement in past few days.  Pain is overall well controlled.   Objective:  Vitals:   04/28/22 0534 04/28/22 1301  BP: (!) 130/92 127/88  Pulse: 82 81  Resp: 18 18  Temp: 98.2 F (36.8 C) 97.8 F (36.6 C)  SpO2: 99% 99%    Body mass index is 28.82 kg/m.  Intake/Output Summary (Last 24 hours) at 04/28/2022 1639 Last data filed at 04/28/2022 1426 Gross per 24 hour  Intake 1346.48 ml  Output 100 ml  Net 1246.48 ml     Sclerae unicteric  Oropharynx clear  No peripheral adenopathy  Lungs clear -- no rales or rhonchi  Heart regular rate and rhythm  Abdomen benign  MSK no focal spinal tenderness, no peripheral edema  Neuro nonfocal   CBG (last 3)  No results for input(s): "GLUCAP" in the last 72 hours.   Labs:  Urine Studies No results for input(s): "UHGB", "CRYS" in the last 72 hours.  Invalid input(s): "UACOL", "UAPR", "USPG", "UPH", "UTP", "UGL", "UKET", "UBIL", "UNIT", "UROB", "ULEU", "UEPI", "UWBC", "URBC", "UBAC", "CAST", "UCOM", "BILUA"  Basic Metabolic Panel: Recent Labs  Lab 04/25/22 1314 04/25/22 1604 04/26/22 0530 04/27/22 0259 04/28/22 0635  NA 136 138 137 139 139  K 3.5 3.2* 3.9 3.4* 3.8  CL 99 102 105 107 108  CO2 '26 24 23 23 24  '$ GLUCOSE 127* 118* 112* 128* 162*  BUN '16 15 13 11 11  '$ CREATININE 1.19* 1.14* 0.89 0.99 1.13*  CALCIUM 9.7 9.4 9.1 9.0 9.2  MG  --  1.9  --  1.8 2.1  PHOS  --  3.1  --  3.3 2.8   GFR Estimated Creatinine Clearance: 52.2 mL/min (A) (by C-G formula based on SCr of 1.13 mg/dL (H)). Liver Function Tests: Recent Labs  Lab 04/25/22 1314 04/25/22 1604 04/26/22 0530 04/27/22 0259 04/28/22 0635  AST 19 17 13* 12* 13*  ALT '9 12 11 9 10  '$ ALKPHOS  91 76 67 69 70  BILITOT 0.8 1.0 0.7 0.7 0.4  PROT 8.4* 8.0 6.9 7.1 7.0  ALBUMIN 4.4 3.8 3.3* 3.4* 3.5   Recent Labs  Lab 04/25/22 1604  LIPASE 34   No results for input(s): "AMMONIA" in the last 168 hours. Coagulation profile No results for input(s): "INR", "PROTIME" in the last 168 hours.  CBC: Recent Labs  Lab 04/25/22 1314 04/25/22 1604 04/26/22 0530 04/27/22 0259 04/28/22 0635  WBC 8.1 7.6 8.5 8.0 9.0  NEUTROABS 5.8 5.5  --   --   --   HGB 12.4 11.9* 10.9* 11.0* 11.3*  HCT 34.2* 33.6* 31.4* 31.5* 32.3*  MCV 106.5* 109.4* 111.3* 110.9* 110.2*  PLT 338 291 265 276 337   Cardiac Enzymes: Recent Labs  Lab 04/25/22 1604  CKTOTAL 61   BNP: Invalid input(s): "POCBNP" CBG: No results for input(s): "GLUCAP" in the last 168 hours. D-Dimer No results for input(s): "DDIMER" in the last 72 hours. Hgb A1c No results for input(s): "HGBA1C" in the last 72 hours. Lipid Profile Recent Labs    04/27/22 0259  TRIG 28   Thyroid function studies No results for input(s): "TSH", "T4TOTAL", "T3FREE", "THYROIDAB" in the last 72 hours.  Invalid  input(s): "FREET3" Anemia work up No results for input(s): "VITAMINB12", "FOLATE", "FERRITIN", "TIBC", "IRON", "RETICCTPCT" in the last 72 hours. Microbiology No results found for this or any previous visit (from the past 240 hour(s)).    Studies:  DG Abd Portable 2V  Result Date: 04/28/2022 CLINICAL DATA:  Abdominal pain with nausea. EXAM: PORTABLE ABDOMEN - 2 VIEW COMPARISON:  04/26/2022 FINDINGS: Upright film shows no evidence for intraperitoneal free air. Mild gaseous small bowel distention in the left upper quadrant is similar at 3.8 cm diameter today compared to 3.2 cm previously. Degenerative changes noted lower lumbar spine. IMPRESSION: Mild gaseous small bowel distention in the left upper quadrant, similar to prior. Electronically Signed   By: Misty Stanley M.D.   On: 04/28/2022 12:54    Assessment: 71 y.o. female   SBO  secondary to peritoneal carcinomatosis. Peritoneal carcinomatosis, likely GI primary, on maintenance chemotherapy with oral Xeloda History of PE in August 2020 Mild anemia Hypertension and chronic diastolic CHF    Plan:  -Her nausea has improved, she is overall feeling better, she was ambulating in the hallway when I saw her. -Pain is also better, but she has not had bowel movement. With NPO, she will likely have less BM  -Repeated abdominal x-ray showed similar mild gaseous small bowel distention -I will check if I can get her in for chemo FOLFOX in office this week, if not, may consider give one dose in hospital before discharge. -Continue TPN and supportive care.   [name], MD 04/28/2022  4:39 PM

## 2022-04-29 ENCOUNTER — Inpatient Hospital Stay: Payer: Self-pay

## 2022-04-29 ENCOUNTER — Other Ambulatory Visit: Payer: Self-pay

## 2022-04-29 DIAGNOSIS — C786 Secondary malignant neoplasm of retroperitoneum and peritoneum: Secondary | ICD-10-CM

## 2022-04-29 DIAGNOSIS — K56609 Unspecified intestinal obstruction, unspecified as to partial versus complete obstruction: Secondary | ICD-10-CM | POA: Diagnosis not present

## 2022-04-29 LAB — COMPREHENSIVE METABOLIC PANEL
ALT: 9 U/L (ref 0–44)
AST: 13 U/L — ABNORMAL LOW (ref 15–41)
Albumin: 3.3 g/dL — ABNORMAL LOW (ref 3.5–5.0)
Alkaline Phosphatase: 66 U/L (ref 38–126)
Anion gap: 7 (ref 5–15)
BUN: 16 mg/dL (ref 8–23)
CO2: 24 mmol/L (ref 22–32)
Calcium: 9.2 mg/dL (ref 8.9–10.3)
Chloride: 107 mmol/L (ref 98–111)
Creatinine, Ser: 1.06 mg/dL — ABNORMAL HIGH (ref 0.44–1.00)
GFR, Estimated: 57 mL/min — ABNORMAL LOW (ref >60)
Glucose, Bld: 151 mg/dL — ABNORMAL HIGH (ref 70–99)
Potassium: 4 mmol/L (ref 3.5–5.1)
Sodium: 138 mmol/L (ref 135–145)
Total Bilirubin: 0.5 mg/dL (ref 0.3–1.2)
Total Protein: 7.1 g/dL (ref 6.5–8.1)

## 2022-04-29 LAB — GLUCOSE, CAPILLARY
Glucose-Capillary: 120 mg/dL — ABNORMAL HIGH (ref 70–99)
Glucose-Capillary: 114 mg/dL — ABNORMAL HIGH (ref 70–99)
Glucose-Capillary: 139 mg/dL — ABNORMAL HIGH (ref 70–99)
Glucose-Capillary: 153 mg/dL — ABNORMAL HIGH (ref 70–99)
Glucose-Capillary: 155 mg/dL — ABNORMAL HIGH (ref 70–99)
Glucose-Capillary: 129 mg/dL — ABNORMAL HIGH (ref 70–99)
Glucose-Capillary: 131 mg/dL — ABNORMAL HIGH (ref 70–99)
Glucose-Capillary: 117 mg/dL — ABNORMAL HIGH (ref 70–99)
Glucose-Capillary: 132 mg/dL — ABNORMAL HIGH (ref 70–99)
Glucose-Capillary: 128 mg/dL — ABNORMAL HIGH (ref 70–99)

## 2022-04-29 LAB — CBC
HCT: 31.9 % — ABNORMAL LOW (ref 36.0–46.0)
Hemoglobin: 11 g/dL — ABNORMAL LOW (ref 12.0–15.0)
MCH: 37.8 pg — ABNORMAL HIGH (ref 26.0–34.0)
MCHC: 34.5 g/dL (ref 30.0–36.0)
MCV: 109.6 fL — ABNORMAL HIGH (ref 80.0–100.0)
Platelets: 275 10*3/uL (ref 150–400)
RBC: 2.91 MIL/uL — ABNORMAL LOW (ref 3.87–5.11)
RDW: 14 % (ref 11.5–15.5)
WBC: 9.1 10*3/uL (ref 4.0–10.5)
nRBC: 0 % (ref 0.0–0.2)

## 2022-04-29 LAB — PHOSPHORUS: Phosphorus: 3.6 mg/dL (ref 2.5–4.6)

## 2022-04-29 LAB — TRIGLYCERIDES: Triglycerides: 37 mg/dL (ref <150)

## 2022-04-29 LAB — MAGNESIUM: Magnesium: 2.1 mg/dL (ref 1.7–2.4)

## 2022-04-29 MED ORDER — SODIUM CHLORIDE 0.9 % IV SOLN: 2200.0000 mg/m2 | INTRAVENOUS | Status: DC

## 2022-04-29 MED ORDER — SODIUM CHLORIDE 0.9% FLUSH: 10.0000 mL | INTRAVENOUS | Status: DC | PRN

## 2022-04-29 MED ORDER — TRAVASOL 10 % IV SOLN
INTRAVENOUS | Status: AC
  Filled 2022-04-29: qty 972

## 2022-04-29 MED ORDER — SODIUM CHLORIDE 0.9 % IV SOLN: 10.0000 mg | Freq: Once | INTRAVENOUS | Status: DC

## 2022-04-29 MED ORDER — AMLODIPINE BESYLATE 5 MG PO TABS
5.0000 mg | ORAL_TABLET | Freq: Every day | ORAL | Status: DC
  Administered 2022-04-29 – 2022-05-03 (×5): 5 mg via ORAL
  Filled 2022-04-29 (×5): qty 1

## 2022-04-29 MED ORDER — SODIUM CHLORIDE 0.9% FLUSH
10.0000 mL | Freq: Two times a day (BID) | INTRAVENOUS | Status: DC
  Administered 2022-04-29: 20 mL
  Administered 2022-04-30 – 2022-05-02 (×4): 10 mL
  Administered 2022-05-02: 20 mL
  Administered 2022-05-03: 10 mL

## 2022-04-29 MED ORDER — OXALIPLATIN CHEMO INJECTION 100 MG/20ML: 50.0000 mg/m2 | Freq: Once | INTRAVENOUS | Status: DC

## 2022-04-29 MED ORDER — COLD PACK MISC ONCOLOGY: 1.0000 | Freq: Once | Status: DC | PRN

## 2022-04-29 MED ORDER — TRAVASOL 10 % IV SOLN: INTRAVENOUS | Status: DC

## 2022-04-29 MED ORDER — PALONOSETRON HCL INJECTION 0.25 MG/5ML: 0.2500 mg | Freq: Once | INTRAVENOUS | Status: DC

## 2022-04-29 MED ORDER — LEUCOVORIN CALCIUM INJECTION 350 MG: 400.0000 mg/m2 | Freq: Once | INTRAVENOUS | Status: DC

## 2022-04-29 MED ORDER — DEXTROSE 5 % IV SOLN: Freq: Once | INTRAVENOUS | Status: DC

## 2022-04-29 MED ORDER — HOT PACK MISC ONCOLOGY: 1.0000 | Freq: Once | Status: DC | PRN

## 2022-04-29 MED ORDER — APIXABAN 5 MG PO TABS
5.0000 mg | ORAL_TABLET | Freq: Two times a day (BID) | ORAL | Status: DC
  Administered 2022-04-29 – 2022-05-03 (×8): 5 mg via ORAL
  Filled 2022-04-29 (×8): qty 1

## 2022-04-29 NOTE — Progress Notes (Signed)
PHARMACY - TOTAL PARENTERAL NUTRITION CONSULT NOTE   Indication: Small bowel obstruction  Patient Measurements: Height: 5' 7" (170.2 cm) Weight: 83.5 kg (184 lb) IBW/kg (Calculated) : 61.6 TPN AdjBW (KG): 67.1 Body mass index is 28.82 kg/m.  Assessment: Pharmacy is consulted to dose TPN for this 30 yoF that was admitted on 8/3 with small bowel obstruction - per CCS, very likely a malignant obstruction secondary to progression of her known carcinomatosis, and a surgical intervention is not likely to help her. NG decompression was not successful.  She will be at high risk for refeeding due to very limited / No oral intake for 4-5 days prior to admission.  Glucose / Insulin: no hx DM - on sSSI q6h (used 5 units in the past 24 hrs) - CBG (goal <150): 151-162 Electrolytes: All WNL including CorrCa 9.76 Renal: SCr/BUN WNL Hepatic: LFTs, Tbili, Alk Phos, Alb WNL Intake / Output; MIVF:  - I/O: +723 mL - D5NS at [place]:  -8/3 CT Abd: Consistent with SBO GI Surgeries / Procedures:   Central access: Implanted port TPN start date: 8/5  Nutritional Goals: Goal TPN rate is 90 mL/hr (provides 97 g of protein and 2030 kcals per day) - Travasol 10% 45g/L - Dextrose 15% - SMOFlipid 25 g/L  RD Assessment:  (8/4) Estimated Needs Total Energy Estimated Needs: 1900-2100 Total Protein Estimated Needs: 90-100g Total Fluid Estimated Needs: 2L/day  Current Nutrition:  NPO TPN  Plan:  Increase TPN to goal rate of 90 mL/hr at 1800 Electrolytes in TPN:  Na 50 mEq/L K 55 mEq/L Ca 4 mEq/L Mg 5 mEq/L Phos 15 mmol/L Cl:Ac 1:1 Add standard MVI and trace elements to TPN Continue Sensitive q6h SSI and adjust as needed MIVF at [facility]. Further adjustment per MD. BMET, mag and phos on 04/30/22 Monitor TPN labs on Mon/Thurs.  Lynelle Doctor, PharmD 04/29/22 7:34 AM

## 2022-04-29 NOTE — Plan of Care (Signed)
Discussed with patient plan of care for the evening, pain management and TPN with some teach back displayed.  Problem: Education: Goal: Knowledge of General Education information will improve Description: Including pain rating scale, medication(s)/side effects and non-pharmacologic comfort measures Outcome: Progressing

## 2022-04-29 NOTE — Progress Notes (Signed)
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  Symptoms remind her of her prior small bowel obstruction.  Imaging in the ED showed recurrent small bowel obstruction and apparently recurrence of her malignancy.  Most recent CT scan in April showed remission.    Subjective / 24h Interval events: Overall she is feeling relatively good.  Had 1 episode of emesis overnight but pain is improved.  No fever or chills  Assesement and Plan: Principal Problem:   Small bowel obstruction (HCC) Active Problems:   Neoplasm of peritoneum   Hypokalemia   History of pulmonary embolus (PE)   Essential hypertension   Chronic diastolic CHF (congestive heart failure) (HCC)   CKD (chronic kidney disease) stage 2, GFR 60-89 ml/min   Principal problem Small bowel obstruction in the setting of underlying progressive malignancy-CT scan of the abdomen pelvis on admission showed peritoneal wall thickening and implants suggesting progressive peritoneal carcinomatosis.  General surgery and oncology following, appreciate input.  Oncology plans to start FOLFOX therapy, and start TPN as this is likely to be persistent.  Overall difficult situation.  She has a small bowel obstruction due to her cancer and it does not seem to be resolving as she is not passing gas or having BMs.  Treatment for her SBO is chemotherapy.  She is now on scheduled antiemetics which makes her feel a little bit better.  Discussed with Dr. Burr Medico with oncology over the  phone, will try to put her back on her oral pills and if she is tolerating dose could potentially discharge home tomorrow to start chemotherapy ASAP.  She will remain n.p.o. at home as she is on TPN  Active problems Hypokalemia-repleted.  Potassium normalized.  Mild anemia-macrocytic, likely in the setting of underlying malignancy.  Hemoglobin is stable  History of PE-placed back on Eliquis today.  If she does not tolerate pills she will need to go home on subcutaneous Lovenox  Essential hypertension-continue to hold oral agents  Chronic diastolic CHF-hold home diuretics, appears euvolemic, monitor.  On fluids   Scheduled Meds:  amLODipine  5 mg Oral Daily   Chlorhexidine Gluconate Cloth  6 each Topical Daily   enoxaparin (LOVENOX) injection  80 mg Subcutaneous Q12H   insulin aspart  0-9 Units Subcutaneous Q6H   ondansetron  4 mg Oral Q6H   pantoprazole  40 mg Oral Daily   Continuous Infusions:  dextrose 5 % and 0.9% NaCl Stopped (04/27/22 1749)   TPN ADULT (ION) 60 mL/hr at 04/28/22 1803   TPN ADULT (ION)     PRN Meds:.acetaminophen **OR** acetaminophen, HYDROcodone-acetaminophen, HYDROmorphone (DILAUDID) injection, prochlorperazine, sodium chloride flush  Diet Orders (From admission, onward)     Start     Ordered   04/27/22 0912  Diet NPO time specified Except for: Ice Chips, Sips with Meds, Other (See Comments)  Diet effective now       Comments: Small water sips  Question Answer Comment  Except for Ice Chips   Except for Sips with Meds   Except for Other (  See Comments)      04/27/22 0911            DVT prophylaxis: SCDs Start: 04/25/22 2143   Lab Results  Component Value Date   PLT 275 04/29/2022      Code Status: Full Code  Family Communication: no family at bedside  Status is: Inpatient  Remains inpatient appropriate because: Persistent small bowel obstruction  Level of care: Telemetry  Consultants:  General  surgery Oncology  Objective: Vitals:   04/28/22 0534 04/28/22 1301 04/28/22 2015 04/29/22 0501  BP: (!) 130/92 127/88 (!) 149/92 (!) 125/94  Pulse: 82 81 84 84  Resp: '18 18 18 19  '$ Temp: 98.2 F (36.8 C) 97.8 F (36.6 C) 98.8 F (37.1 C) 98.7 F (37.1 C)  TempSrc: Oral Oral    SpO2: 99% 99% 98% 99%  Weight:      Height:        Intake/Output Summary (Last 24 hours) at 04/29/2022 1010 Last data filed at 04/29/2022 0700 Gross per 24 hour  Intake 482.9 ml  Output --  Net 482.9 ml    Wt Readings from Last 3 Encounters:  04/25/22 83.5 kg  04/25/22 83.5 kg  04/15/22 86.3 kg    Examination: Constitutional: NAD Eyes: lids and conjunctivae normal, no scleral icterus ENMT: mmm Neck: normal, supple Respiratory: clear to auscultation bilaterally, no wheezing, no crackles. Normal respiratory effort.  Cardiovascular: Regular rate and rhythm, no murmurs / rubs / gallops. No LE edema. Abdomen: soft Skin: no rashes Neurologic: no focal deficits, equal strength  Data Reviewed: I have independently reviewed following labs and imaging studies   CBC Recent Labs  Lab 04/25/22 1314 04/25/22 1604 04/26/22 0530 04/27/22 0259 04/28/22 0635 04/29/22 0434  WBC 8.1 7.6 8.5 8.0 9.0 9.1  HGB 12.4 11.9* 10.9* 11.0* 11.3* 11.0*  HCT 34.2* 33.6* 31.4* 31.5* 32.3* 31.9*  PLT 338 291 265 276 337 275  MCV 106.5* 109.4* 111.3* 110.9* 110.2* 109.6*  MCH 38.6* 38.8* 38.7* 38.7* 38.6* 37.8*  MCHC 36.3* 35.4 34.7 34.9 35.0 34.5  RDW 13.9 14.3 14.4 14.2 14.3 14.0  LYMPHSABS 1.4 1.3  --   --   --   --   MONOABS 0.7 0.6  --   --   --   --   EOSABS 0.1 0.0  --   --   --   --   BASOSABS 0.0 0.0  --   --   --   --      Recent Labs  Lab 04/25/22 1604 04/26/22 0530 04/27/22 0259 04/28/22 0635 04/29/22 0434  NA 138 137 139 139 138  K 3.2* 3.9 3.4* 3.8 4.0  CL 102 105 107 108 107  CO2 '24 23 23 24 24  '$ GLUCOSE 118* 112* 128* 162* 151*  BUN '15 13 11 11 16  '$ CREATININE 1.14* 0.89 0.99 1.13*  1.06*  CALCIUM 9.4 9.1 9.0 9.2 9.2  AST 17 13* 12* 13* 13*  ALT '12 11 9 10 9  '$ ALKPHOS 76 67 69 70 66  BILITOT 1.0 0.7 0.7 0.4 0.5  ALBUMIN 3.8 3.3* 3.4* 3.5 3.3*  MG 1.9  --  1.8 2.1 2.1     ------------------------------------------------------------------------------------------------------------------ Recent Labs    04/27/22 0259 04/29/22 0434  TRIG 28 37     Lab Results  Component Value Date   HGBA1C 6.4 (H) 05/05/2021   ------------------------------------------------------------------------------------------------------------------ No results for input(s): "TSH", "T4TOTAL", "T3FREE", "THYROIDAB" in the last 72 hours.  Invalid input(s): "FREET3"  Cardiac  Enzymes No results for input(s): "CKMB", "TROPONINI", "MYOGLOBIN" in the last 168 hours.  Invalid input(s): "CK" ------------------------------------------------------------------------------------------------------------------ No results found for: "BNP"  CBG: Recent Labs  Lab 04/28/22 0532 04/28/22 1056 04/28/22 1721 04/28/22 2311 04/29/22 0501  GLUCAP 153* 155* 129* 131* 117*    No results found for this or any previous visit (from the past 240 hour(s)).   Radiology Studies: DG Abd Portable 2V  Result Date: 04/28/2022 CLINICAL DATA:  Abdominal pain with nausea. EXAM: PORTABLE ABDOMEN - 2 VIEW COMPARISON:  04/26/2022 FINDINGS: Upright film shows no evidence for intraperitoneal free air. Mild gaseous small bowel distention in the left upper quadrant is similar at 3.8 cm diameter today compared to 3.2 cm previously. Degenerative changes noted lower lumbar spine. IMPRESSION: Mild gaseous small bowel distention in the left upper quadrant, similar to prior. Electronically Signed   By: Misty Stanley M.D.   On: 04/28/2022 12:54     Marzetta Board, MD, PhD [facility]  Between 7 am - 7 pm I am available, please contact me via Amion (for emergencies) or Securechat (non urgent messages)  Between 7 pm - 7  am I am not available, please contact night coverage MD/APP via Amion

## 2022-04-29 NOTE — Progress Notes (Signed)
ANTICOAGULATION CONSULT NOTE - Initial Consult  Pharmacy Consult for Eliquis Indication: history of pulmonary embolus  No Known Allergies  Patient Measurements: Height: '5\' 7"'$  (170.2 cm) Weight: 83.5 kg (184 lb) IBW/kg (Calculated) : 61.6  Vital Signs: Temp: 98.7 F (37.1 C) (08/07 0501) BP: 125/94 (08/07 0501) Pulse Rate: 84 (08/07 0501)  Labs: Recent Labs    04/26/22 1630 04/27/22 0259 04/27/22 0259 04/28/22 0635 04/29/22 0434  HGB  --  11.0*   < > 11.3* 11.0*  HCT  --  31.5*  --  32.3* 31.9*  PLT  --  276  --  337 275  APTT 36 123*  --   --   --   HEPARINUNFRC 0.60 0.96*  --   --   --   CREATININE  --  0.99  --  1.13* 1.06*   < > = values in this interval not displayed.    Estimated Creatinine Clearance: 55.7 mL/min (A) (by C-G formula based on SCr of 1.06 mg/dL (H)).   Medical History: Past Medical History:  Diagnosis Date   Acid reflux    Hypertension   Signet ring cell adenocarcinoma with peritoneal carcinomatosis Anemia  Medications:  PTA Eliquis 5 mg BID  Assessment: 16 YOF admitted on 8/3 for small bowel obstruction likely due to progression of malignant peritoneal neoplasm. She has a hx of PE (04/2021) being treated indefinitely PTA with Eliquis for persistent risk factor (cancer). Eliquis held and enoxaparin initiated 8/3 d/t NPO status. Now ready to trial return to oral anticoagulation. Pharmacy has been consulted for Eliquis dosing.   04/29/22: Enoxaparin 80 mg [place] dose given at 0900. LFTs WNL. CBC reviewed. Hgb slightly low (11.0) but stable. No bleeding documented.  Goal of Therapy:  Monitor platelets by anticoagulation protocol: Yes   Plan:  Initiate Eliquis 5 mg one tablet PO BID starting at 04/29/22 2100 Discontinue enoxaparin 80 mg  q12h Monitor for PO tolerability >> transition back to Lovenox if needed  [employer] 04/29/2022,10:18 AM

## 2022-04-29 NOTE — TOC Transition Note (Addendum)
Transition of Care [facility]) - CM/SW Discharge Note   Patient Details  Name: Brenda Stewart MRN: [id number] Date of Birth: 10/29/1950  Transition of Care [facility]) CM/SW Contact:  Vassie Moselle, LCSW Phone Number: 04/29/2022, 9:48 AM   Clinical Narrative:    Pt is to return home with TPN. Education and supplies provided by [employer]. Pt is to have [facility] services through [facility]. These services have been confirmed with Caryl Pina with [facility].    Final next level of care: [place] Barriers to Discharge: Continued Medical Work up   Patient Goals and CMS Choice Patient states their goals for this hospitalization and ongoing recovery are:: Home CMS Medicare.gov Compare Post Acute Care list provided to:: Patient Choice offered to / list presented to : Patient  Discharge Placement                       Discharge Plan and Services   Discharge Planning Services: CM Consult Post Acute Care Choice: Home Health          DME Arranged: IV pump/equipment DME Agency: Other - Comment Roel Cluck) Date DME Agency Contacted: 04/28/22   Representative spoke with at DME Agency: [place] Arranged: RN [place] Agency: [place] ([place]) Date [place]: 04/28/22   Representative spoke with at [place]: Corene Cornea; Confirmed with Caryl Pina 8/7  Social Determinants of Health (SDOH) Interventions     Readmission Risk Interventions    04/27/2021   10:52 AM  Readmission Risk Prevention Plan  Transportation Screening Complete  PCP or Specialist Appt within 5-7 Days Complete  Home Care Screening Complete  Medication Review (RN CM) Complete

## 2022-04-29 NOTE — Progress Notes (Signed)
Peripherally Inserted Central Catheter Placement  The IV Nurse has discussed with the patient and/or persons authorized to consent for the patient, the purpose of this procedure and the potential benefits and risks involved with this procedure.  The benefits include less needle sticks, lab draws from the catheter, and the patient may be discharged home with the catheter. Risks include, but not limited to, infection, bleeding, blood clot (thrombus formation), and puncture of an artery; nerve damage and irregular heartbeat and possibility to perform a PICC exchange if needed/ordered by physician.  Alternatives to this procedure were also discussed.  Bard Power PICC patient education guide, fact sheet on infection prevention and patient information card has been provided to patient /or left at bedside.    PICC Placement Documentation  PICC Double Lumen 04/29/22 Right Brachial 41 cm 0 cm (Active)  Indication for Insertion or Continuance of Line Administration of hyperosmolar/irritating solutions (i.e. TPN, Vancomycin, etc.) 04/29/22 1822  Exposed Catheter (cm) 0 cm 04/29/22 1822  Site Assessment Clean, Dry, Intact 04/29/22 1822  Lumen #1 Status Flushed;Blood return noted;Saline locked 04/29/22 1822  Lumen #2 Status Flushed;Blood return noted;Saline locked 04/29/22 1822  Dressing Type Transparent 04/29/22 1822  Dressing Status Antimicrobial disc in place 04/29/22 1822  Dressing Change Due 05/06/22 04/29/22 1822       Scotty Court 04/29/2022, 6:24 PM

## 2022-04-29 NOTE — Progress Notes (Signed)
Order placed for patient to transfer to [place] made aware.  Bed request placed and accepted.  Patient transferred to 1621.  Report given.

## 2022-04-29 NOTE — Progress Notes (Cosign Needed)
Brenda Stewart   DOB:10-Feb-1951   ZO#:[id number]   JWJ#:[id number]  ONCOLOGY FOLLOW UP   Subjective: Brenda Stewart continues to take in some ice chips.  She is not having any recurrent nausea or vomiting.  Abdominal pain is controlled.   Objective:  Vitals:   04/29/22 0501 04/29/22 1149  BP: (!) 125/94 (!) 132/94  Pulse: 84 91  Resp: 19 18  Temp: 98.7 F (37.1 C) 98.2 F (36.8 C)  SpO2: 99% 100%    Body mass index is 28.82 kg/m.  Intake/Output Summary (Last 24 hours) at 04/29/2022 1359 Last data filed at 04/29/2022 0700 Gross per 24 hour  Intake 322.97 ml  Output --  Net 322.97 ml     Sclerae unicteric  Oropharynx clear  No peripheral adenopathy  Lungs clear -- no rales or rhonchi  Heart regular rate and rhythm  Abdomen benign  MSK no focal spinal tenderness, no peripheral edema  Neuro nonfocal   CBG (last 3)  Recent Labs    04/28/22 2311 04/29/22 0501 04/29/22 1201  GLUCAP 131* 117* 132*     Labs:  Urine Studies No results for input(s): "UHGB", "CRYS" in the last 72 hours.  Invalid input(s): "UACOL", "UAPR", "USPG", "UPH", "UTP", "UGL", "UKET", "UBIL", "UNIT", "UROB", "ULEU", "UEPI", "UWBC", "URBC", "UBAC", "CAST", "UCOM", "BILUA"  Basic Metabolic Panel: Recent Labs  Lab 04/25/22 1604 04/26/22 0530 04/27/22 0259 04/28/22 0635 04/29/22 0434  NA 138 137 139 139 138  K 3.2* 3.9 3.4* 3.8 4.0  CL 102 105 107 108 107  CO2 '24 23 23 24 24  '$ GLUCOSE 118* 112* 128* 162* 151*  BUN '15 13 11 11 16  '$ CREATININE 1.14* 0.89 0.99 1.13* 1.06*  CALCIUM 9.4 9.1 9.0 9.2 9.2  MG 1.9  --  1.8 2.1 2.1  PHOS 3.1  --  3.3 2.8 3.6   GFR Estimated Creatinine Clearance: 55.7 mL/min (A) (by C-G formula based on SCr of 1.06 mg/dL (H)). Liver Function Tests: Recent Labs  Lab 04/25/22 1604 04/26/22 0530 04/27/22 0259 04/28/22 0635 04/29/22 0434  AST 17 13* 12* 13* 13*  ALT '12 11 9 10 9  '$ ALKPHOS 76 67 69 70 66  BILITOT 1.0 0.7 0.7 0.4 0.5  PROT 8.0 6.9 7.1 7.0 7.1  ALBUMIN  3.8 3.3* 3.4* 3.5 3.3*   Recent Labs  Lab 04/25/22 1604  LIPASE 34   No results for input(s): "AMMONIA" in the last 168 hours. Coagulation profile No results for input(s): "INR", "PROTIME" in the last 168 hours.  CBC: Recent Labs  Lab 04/25/22 1314 04/25/22 1604 04/26/22 0530 04/27/22 0259 04/28/22 0635 04/29/22 0434  WBC 8.1 7.6 8.5 8.0 9.0 9.1  NEUTROABS 5.8 5.5  --   --   --   --   HGB 12.4 11.9* 10.9* 11.0* 11.3* 11.0*  HCT 34.2* 33.6* 31.4* 31.5* 32.3* 31.9*  MCV 106.5* 109.4* 111.3* 110.9* 110.2* 109.6*  PLT 338 291 265 276 337 275   Cardiac Enzymes: Recent Labs  Lab 04/25/22 1604  CKTOTAL 61   BNP: Invalid input(s): "POCBNP" CBG: Recent Labs  Lab 04/28/22 1056 04/28/22 1721 04/28/22 2311 04/29/22 0501 04/29/22 1201  GLUCAP 155* 129* 131* 117* 132*   D-Dimer No results for input(s): "DDIMER" in the last 72 hours. Hgb A1c No results for input(s): "HGBA1C" in the last 72 hours. Lipid Profile Recent Labs    04/27/22 0259 04/29/22 0434  TRIG 28 37   Thyroid function studies No results for input(s): "TSH", "T4TOTAL", "T3FREE", "THYROIDAB"  in the last 72 hours.  Invalid input(s): "FREET3" Anemia work up No results for input(s): "VITAMINB12", "FOLATE", "FERRITIN", "TIBC", "IRON", "RETICCTPCT" in the last 72 hours. Microbiology No results found for this or any previous visit (from the past 240 hour(s)).    Studies:  DG Abd Portable 2V  Result Date: 04/28/2022 CLINICAL DATA:  Abdominal pain with nausea. EXAM: PORTABLE ABDOMEN - 2 VIEW COMPARISON:  04/26/2022 FINDINGS: Upright film shows no evidence for intraperitoneal free air. Mild gaseous small bowel distention in the left upper quadrant is similar at 3.8 cm diameter today compared to 3.2 cm previously. Degenerative changes noted lower lumbar spine. IMPRESSION: Mild gaseous small bowel distention in the left upper quadrant, similar to prior. Electronically Signed   By: Misty Stanley M.D.   On:  04/28/2022 12:54    Assessment: 71 y.o. 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 Peritoneal carcinomatosis, likely GI primary, on maintenance chemotherapy with oral Xeloda History of PE in August 2020 Mild anemia Hypertension and chronic diastolic CHF   Plan:  -Her abdominal pain, nausea, vomiting are overall improved. -Has been started on TPN. -We discussed proceeding with FOLFOX to treat her cancer.  She agrees to this.  Her Port-A-Cath is currently being used for TPN.  PICC line placement has been requested.  Plan had been to begin chemotherapy today but likely will not start until tomorrow once PICC is placed.  Brenda Bussing, NP 04/29/2022  1:59 PM  Addendum I have seen the patient, examined her. I agree with the assessment and and plan and have edited the notes.   Pt is clinically stable, still no BM, but has small amount flatus, nausea is controlled. I recommend to start chemo in hospital, due to central line issue, will start tomorrow after PICC placement. Pt agrees with the plan.  Brenda Stewart  04/29/2022

## 2022-04-29 NOTE — Care Management Important Message (Signed)
Important Message  Patient Details IM Letter given to the Patient. Name: Brenda Stewart MRN: [id number] Date of Birth: 1951-08-15   Medicare Important Message Given:  Yes     [name] 04/29/2022, 10:45 AM

## 2022-04-30 ENCOUNTER — Other Ambulatory Visit (HOSPITAL_COMMUNITY): Payer: Self-pay

## 2022-04-30 DIAGNOSIS — K56609 Unspecified intestinal obstruction, unspecified as to partial versus complete obstruction: Secondary | ICD-10-CM | POA: Diagnosis not present

## 2022-04-30 LAB — BASIC METABOLIC PANEL
Anion gap: 6 (ref 5–15)
BUN: 20 mg/dL (ref 8–23)
CO2: 24 mmol/L (ref 22–32)
Calcium: 9 mg/dL (ref 8.9–10.3)
Chloride: 106 mmol/L (ref 98–111)
Creatinine, Ser: 1.06 mg/dL — ABNORMAL HIGH (ref 0.44–1.00)
GFR, Estimated: 57 mL/min — ABNORMAL LOW (ref >60)
Glucose, Bld: 141 mg/dL — ABNORMAL HIGH (ref 70–99)
Potassium: 4.3 mmol/L (ref 3.5–5.1)
Sodium: 136 mmol/L (ref 135–145)

## 2022-04-30 LAB — CBC
HCT: 32 % — ABNORMAL LOW (ref 36.0–46.0)
Hemoglobin: 11.1 g/dL — ABNORMAL LOW (ref 12.0–15.0)
MCH: 38.4 pg — ABNORMAL HIGH (ref 26.0–34.0)
MCHC: 34.7 g/dL (ref 30.0–36.0)
MCV: 110.7 fL — ABNORMAL HIGH (ref 80.0–100.0)
Platelets: 254 10*3/uL (ref 150–400)
RBC: 2.89 MIL/uL — ABNORMAL LOW (ref 3.87–5.11)
RDW: 14.2 % (ref 11.5–15.5)
WBC: 8.8 10*3/uL (ref 4.0–10.5)
nRBC: 0 % (ref 0.0–0.2)

## 2022-04-30 LAB — GLUCOSE, CAPILLARY
Glucose-Capillary: 130 mg/dL — ABNORMAL HIGH (ref 70–99)
Glucose-Capillary: 138 mg/dL — ABNORMAL HIGH (ref 70–99)
Glucose-Capillary: 150 mg/dL — ABNORMAL HIGH (ref 70–99)
Glucose-Capillary: 202 mg/dL — ABNORMAL HIGH (ref 70–99)
Glucose-Capillary: 229 mg/dL — ABNORMAL HIGH (ref 70–99)
Glucose-Capillary: 233 mg/dL — ABNORMAL HIGH (ref 70–99)

## 2022-04-30 LAB — PHOSPHORUS: Phosphorus: 3.9 mg/dL (ref 2.5–4.6)

## 2022-04-30 LAB — MAGNESIUM: Magnesium: 2 mg/dL (ref 1.7–2.4)

## 2022-04-30 MED ORDER — LEUCOVORIN CALCIUM INJECTION 350 MG
400.0000 mg/m2 | Freq: Once | INTRAVENOUS | Status: AC
  Administered 2022-04-30: 816 mg via INTRAVENOUS
  Filled 2022-04-30: qty 5

## 2022-04-30 MED ORDER — HOT PACK MISC ONCOLOGY: 1.0000 | Freq: Once | Status: AC | PRN

## 2022-04-30 MED ORDER — SODIUM CHLORIDE 0.9 % IV SOLN: 2200.0000 mg/m2 | INTRAVENOUS | Status: DC

## 2022-04-30 MED ORDER — OXALIPLATIN CHEMO INJECTION 100 MG/20ML
50.0000 mg/m2 | Freq: Once | INTRAVENOUS | Status: AC
  Administered 2022-04-30: 100 mg via INTRAVENOUS
  Filled 2022-04-30: qty 20

## 2022-04-30 MED ORDER — FLUOROURACIL CHEMO INJECTION 5 GM/100ML
2200.0000 mg/m2 | Freq: Once | INTRAVENOUS | Status: AC
  Administered 2022-04-30: 4500 mg via INTRAVENOUS
  Filled 2022-04-30: qty 90

## 2022-04-30 MED ORDER — PALONOSETRON HCL INJECTION 0.25 MG/5ML
0.2500 mg | Freq: Once | INTRAVENOUS | Status: AC
  Administered 2022-04-30: 0.25 mg via INTRAVENOUS
  Filled 2022-04-30: qty 5

## 2022-04-30 MED ORDER — DEXTROSE 5 % IV SOLN: Freq: Once | INTRAVENOUS | Status: AC

## 2022-04-30 MED ORDER — COLD PACK MISC ONCOLOGY: 1.0000 | Freq: Once | Status: AC | PRN

## 2022-04-30 MED ORDER — ONDANSETRON 4 MG PO TBDP
4.0000 mg | ORAL_TABLET | Freq: Four times a day (QID) | ORAL | Status: DC
  Administered 2022-04-30 – 2022-05-03 (×9): 4 mg via ORAL
  Filled 2022-04-30 (×9): qty 1

## 2022-04-30 MED ORDER — TRAVASOL 10 % IV SOLN
INTRAVENOUS | Status: AC
  Filled 2022-04-30: qty 972

## 2022-04-30 MED ORDER — SODIUM CHLORIDE 0.9 % IV SOLN
10.0000 mg | Freq: Once | INTRAVENOUS | Status: AC
  Administered 2022-04-30: 10 mg via INTRAVENOUS
  Filled 2022-04-30: qty 1

## 2022-04-30 NOTE — Progress Notes (Signed)
Nutrition Follow-up  INTERVENTION:   -TPN management per Pharmacy  -Weight patient today  NUTRITION DIAGNOSIS:   Increased nutrient needs related to cancer and cancer related treatments as evidenced by estimated needs.  Ongoing.  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 Admitted for SBO.  Patient now on TPN as of 8/5. Per oncology note, plan is for pt to go home with TPN.  TPN continues at goal rate of 90 ml/hr (providing 2030 kcals and 97g protein).  Admission weight: 184 lbs Will order for updated weight.  Medications: D5 infusion  Labs reviewed: CBGs: 128-233   Diet Order:   Diet Order             Diet NPO time specified Except for: Ice Chips, Sips with Meds, Other (See Comments)  Diet effective now                   EDUCATION NEEDS:   No education needs have been identified at this time  Skin:  Skin Assessment: Reviewed RN Assessment  Last BM:  8/5 -type 2  Height:   Ht Readings from Last 1 Encounters:  04/25/22 '5\' 7"'$  (1.702 m)    Weight:   Wt Readings from Last 1 Encounters:  04/25/22 83.5 kg    BMI:  Body mass index is 28.82 kg/m.  Estimated Nutritional Needs:   Kcal:  1900-2100  Protein:  90-100g  Fluid:  2L/day  Clayton Bibles, MS, RD, LDN Inpatient Clinical Dietitian Contact information available via Amion

## 2022-04-30 NOTE — Progress Notes (Signed)
Chemotherapy was administered and verified via video call with chemo certified Nurse Jethro Bolus. Patient tolerated treatment well with no issues. VSS throughout treatment. 5FU hung. Patient and nurse aware that it will run over 46 hours.

## 2022-04-30 NOTE — Progress Notes (Signed)
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  Symptoms remind her of her prior small bowel obstruction.  Imaging in the ED showed recurrent small bowel obstruction and apparently recurrence of her malignancy.  Most recent CT scan in April showed remission.    Subjective / 24h Interval events: Feeling well today.  Had some nausea but has not had any large emesis.  She is able to keep Eliquis down.  Passed some gas but no BMs yet  Assesement and Plan: Principal Problem:   Small bowel obstruction (HCC) Active Problems:   Neoplasm of peritoneum   Hypokalemia   History of pulmonary embolus (PE)   Essential hypertension   Chronic diastolic CHF (congestive heart failure) (HCC)   CKD (chronic kidney disease) stage 2, GFR 60-89 ml/min   Principal problem Small bowel obstruction in the setting of underlying progressive malignancy-CT scan of the abdomen pelvis on admission showed peritoneal wall thickening and implants suggesting progressive peritoneal carcinomatosis.  General surgery consulted, signed off.  Oncology following, appreciate input.  Oncology started TPN with plans to start FOLFOX chemotherapy ASAP.  Current clinical scenario is very similar to the one last year when she was initially diagnosed with cancer.  She was on TPN for a while and responded to FOLFOX therapy and eventually went back to eating.  Her small bowel obstruction does not appear to be resolving with conservative management (however she  could not tolerate an NG tube), and oncology is starting emergent chemotherapy today in the hospital  Active problems Hypokalemia-repleted, now normalized  Mild anemia-macrocytic, likely in the setting of underlying malignancy.  Hemoglobin is stable  History of PE-continue Eliquis.  This was started on 8/7.  For now seems to be tolerating pills without emesis.  If she starts vomiting her Eliquis will need to be switched to subcutaneous Lovenox  Essential hypertension-continue to hold oral agents, blood pressure acceptable  Chronic diastolic CHF-hold home diuretics, appears euvolemic, monitor.  On fluids   Scheduled Meds:  amLODipine  5 mg Oral Daily   apixaban  5 mg Oral BID   Chlorhexidine Gluconate Cloth  6 each Topical Daily   fluorouracil (ADRUCIL) 4,500 mg in dextrose 5 % 1,000 mL chemo infusion  2,200 mg/m2 (Treatment Plan Recorded) Intravenous Once   insulin aspart  0-9 Units Subcutaneous Q6H   leucovorin 816 mg in dextrose 5 % 250 mL infusion  400 mg/m2 (Treatment Plan Recorded) Intravenous Once   oxaliplatin (ELOXATIN) 100 mg in dextrose 5 % 500 mL chemo infusion  50 mg/m2 (Treatment Plan Recorded) Intravenous Once   palonosetron  0.25 mg Intravenous Once   pantoprazole  40 mg Oral Daily   sodium chloride flush  10-40 mL Intracatheter Q12H   Continuous Infusions:  dexamethasone (DECADRON) IVPB ([facility])     dextrose     dextrose     dextrose 5 % and 0.9% NaCl Stopped (04/27/22 1749)   TPN ADULT (ION) 90 mL/hr at 04/30/22 0320   TPN ADULT (ION)     PRN Meds:.acetaminophen **OR** acetaminophen,  Cold Pack, Hot Pack, HYDROcodone-acetaminophen, HYDROmorphone (DILAUDID) injection, prochlorperazine, sodium chloride flush, sodium chloride flush  Diet Orders (From admission, onward)     Start     Ordered   04/27/22 0912  Diet NPO time specified Except for: Ice Chips, Sips with Meds, Other (See Comments)  Diet effective now       Comments: Small water sips  Question Answer  Comment  Except for Ice Chips   Except for Sips with Meds   Except for Other (See Comments)      04/27/22 0911            DVT prophylaxis: SCDs Start: 04/25/22 2143 apixaban (ELIQUIS) tablet 5 mg   Lab Results  Component Value Date   PLT 254 04/30/2022      Code Status: Full Code  Family Communication: no family at bedside  Status is: Inpatient  Remains inpatient appropriate because: Persistent small bowel obstruction  Level of care: Med-Surg  Consultants:  General surgery Oncology  Objective: Vitals:   04/29/22 1149 04/29/22 2203 04/30/22 0215 04/30/22 0600  BP: (!) 132/94 (!) 146/91 (!) 140/88 (!) 140/78  Pulse: 91 92 87 88  Resp: '18 17 17 17  '$ Temp: 98.2 F (36.8 C) 98.2 F (36.8 C) 98.2 F (36.8 C) 98 F (36.7 C)  TempSrc: Oral Oral Oral Oral  SpO2: 100% 97% 94% 95%  Weight:      Height:        Intake/Output Summary (Last 24 hours) at 04/30/2022 1125 Last data filed at 04/30/2022 0320 Gross per 24 hour  Intake 923.36 ml  Output --  Net 923.36 ml    Wt Readings from Last 3 Encounters:  04/25/22 83.5 kg  04/25/22 83.5 kg  04/15/22 86.3 kg    Examination: Constitutional: NAD Eyes: lids and conjunctivae normal, no scleral icterus ENMT: mmm Neck: normal, supple Respiratory: clear to auscultation bilaterally, no wheezing, no crackles. Normal respiratory effort.  Cardiovascular: Regular rate and rhythm, no murmurs / rubs / gallops. No LE edema. Abdomen: soft, mild tenderness throughout but no guarding or rebound Skin: no rashes Neurologic: no focal deficits, equal strength   Data Reviewed: I have independently reviewed following labs and imaging studies   CBC Recent Labs  Lab 04/25/22 1314 04/25/22 1314 04/25/22 1604 04/26/22 0530 04/27/22 0259 04/28/22 0635 04/29/22 0434 04/30/22 0540  WBC 8.1   < > 7.6 8.5 8.0 9.0 9.1 8.8  HGB 12.4   < > 11.9* 10.9* 11.0* 11.3* 11.0* 11.1*  HCT 34.2*  --  33.6* 31.4* 31.5* 32.3* 31.9* 32.0*  PLT  338   < > 291 265 276 337 275 254  MCV 106.5*  --  109.4* 111.3* 110.9* 110.2* 109.6* 110.7*  MCH 38.6*  --  38.8* 38.7* 38.7* 38.6* 37.8* 38.4*  MCHC 36.3*  --  35.4 34.7 34.9 35.0 34.5 34.7  RDW 13.9  --  14.3 14.4 14.2 14.3 14.0 14.2  LYMPHSABS 1.4  --  1.3  --   --   --   --   --   MONOABS 0.7  --  0.6  --   --   --   --   --   EOSABS 0.1  --  0.0  --   --   --   --   --   BASOSABS 0.0  --  0.0  --   --   --   --   --    < > = values in this interval not displayed.  Recent Labs  Lab 04/25/22 1604 04/26/22 0530 04/27/22 0259 04/28/22 0635 04/29/22 0434 04/30/22 0540  NA 138 137 139 139 138 136  K 3.2* 3.9 3.4* 3.8 4.0 4.3  CL 102 105 107 108 107 106  CO2 '24 23 23 24 24 24  '$ GLUCOSE 118* 112* 128* 162* 151* 141*  BUN '15 13 11 11 16 20  '$ CREATININE 1.14* 0.89 0.99 1.13* 1.06* 1.06*  CALCIUM 9.4 9.1 9.0 9.2 9.2 9.0  AST 17 13* 12* 13* 13*  --   ALT '12 11 9 10 9  '$ --   ALKPHOS 76 67 69 70 66  --   BILITOT 1.0 0.7 0.7 0.4 0.5  --   ALBUMIN 3.8 3.3* 3.4* 3.5 3.3*  --   MG 1.9  --  1.8 2.1 2.1 2.0     ------------------------------------------------------------------------------------------------------------------ Recent Labs    04/29/22 0434  TRIG 37     Lab Results  Component Value Date   HGBA1C 6.4 (H) 05/05/2021   ------------------------------------------------------------------------------------------------------------------ No results for input(s): "TSH", "T4TOTAL", "T3FREE", "THYROIDAB" in the last 72 hours.  Invalid input(s): "FREET3"  Cardiac Enzymes No results for input(s): "CKMB", "TROPONINI", "MYOGLOBIN" in the last 168 hours.  Invalid input(s): "CK" ------------------------------------------------------------------------------------------------------------------ No results found for: "BNP"  CBG: Recent Labs  Lab 04/29/22 1201 04/29/22 1728 04/29/22 2359 04/30/22 0605 04/30/22 0751  GLUCAP 132* 128* 150* 138* 130*     No results found  for this or any previous visit (from the past 240 hour(s)).   Radiology Studies: [place] EKG SITE RITE  Result Date: 04/29/2022 If Site Rite image not attached, placement could not be confirmed due to current cardiac rhythm.    Marzetta Board, MD, PhD [facility]  Between 7 am - 7 pm I am available, please contact me via Amion (for emergencies) or Securechat (non urgent messages)  Between 7 pm - 7 am I am not available, please contact night coverage MD/APP via Amion

## 2022-04-30 NOTE — Progress Notes (Addendum)
Brenda Stewart   DOB:23-Nov-1950   RC#:[id number]   PZW#:[id number]  ONCOLOGY FOLLOW UP   Subjective: Pain controlled, no nausea or vomiting.  Continues on TPN.  PICC line placed last evening in anticipation of chemotherapy today.   Objective:  Vitals:   04/30/22 0215 04/30/22 0600  BP: (!) 140/88 (!) 140/78  Pulse: 87 88  Resp: 17 17  Temp: 98.2 F (36.8 C) 98 F (36.7 C)  SpO2: 94% 95%    Body mass index is 28.82 kg/m.  Intake/Output Summary (Last 24 hours) at 04/30/2022 1316 Last data filed at 04/30/2022 0320 Gross per 24 hour  Intake 923.36 ml  Output --  Net 923.36 ml     Sclerae unicteric  Oropharynx clear  No peripheral adenopathy  Lungs clear -- no rales or rhonchi  Heart regular rate and rhythm  Abdomen benign  MSK no focal spinal tenderness, no peripheral edema  Neuro nonfocal   CBG (last 3)  Recent Labs    04/30/22 0605 04/30/22 0751 04/30/22 1257  GLUCAP 138* 130* 233*     Labs:  Urine Studies No results for input(s): "UHGB", "CRYS" in the last 72 hours.  Invalid input(s): "UACOL", "UAPR", "USPG", "UPH", "UTP", "UGL", "UKET", "UBIL", "UNIT", "UROB", "ULEU", "UEPI", "UWBC", "URBC", "UBAC", "CAST", "UCOM", "BILUA"  Basic Metabolic Panel: Recent Labs  Lab 04/25/22 1604 04/26/22 0530 04/27/22 0259 04/28/22 0635 04/29/22 0434 04/30/22 0540  NA 138 137 139 139 138 136  K 3.2* 3.9 3.4* 3.8 4.0 4.3  CL 102 105 107 108 107 106  CO2 '24 23 23 24 24 24  '$ GLUCOSE 118* 112* 128* 162* 151* 141*  BUN '15 13 11 11 16 20  '$ CREATININE 1.14* 0.89 0.99 1.13* 1.06* 1.06*  CALCIUM 9.4 9.1 9.0 9.2 9.2 9.0  MG 1.9  --  1.8 2.1 2.1 2.0  PHOS 3.1  --  3.3 2.8 3.6 3.9   GFR Estimated Creatinine Clearance: 55.7 mL/min (A) (by C-G formula based on SCr of 1.06 mg/dL (H)). Liver Function Tests: Recent Labs  Lab 04/25/22 1604 04/26/22 0530 04/27/22 0259 04/28/22 0635 04/29/22 0434  AST 17 13* 12* 13* 13*  ALT '12 11 9 10 9  '$ ALKPHOS 76 67 69 70 66  BILITOT 1.0  0.7 0.7 0.4 0.5  PROT 8.0 6.9 7.1 7.0 7.1  ALBUMIN 3.8 3.3* 3.4* 3.5 3.3*   Recent Labs  Lab 04/25/22 1604  LIPASE 34   No results for input(s): "AMMONIA" in the last 168 hours. Coagulation profile No results for input(s): "INR", "PROTIME" in the last 168 hours.  CBC: Recent Labs  Lab 04/25/22 1314 04/25/22 1314 04/25/22 1604 04/26/22 0530 04/27/22 0259 04/28/22 0635 04/29/22 0434 04/30/22 0540  WBC 8.1   < > 7.6 8.5 8.0 9.0 9.1 8.8  NEUTROABS 5.8  --  5.5  --   --   --   --   --   HGB 12.4   < > 11.9* 10.9* 11.0* 11.3* 11.0* 11.1*  HCT 34.2*  --  33.6* 31.4* 31.5* 32.3* 31.9* 32.0*  MCV 106.5*  --  109.4* 111.3* 110.9* 110.2* 109.6* 110.7*  PLT 338   < > 291 265 276 337 275 254   < > = values in this interval not displayed.   Cardiac Enzymes: Recent Labs  Lab 04/25/22 1604  CKTOTAL 61   BNP: Invalid input(s): "POCBNP" CBG: Recent Labs  Lab 04/29/22 1728 04/29/22 2359 04/30/22 0605 04/30/22 0751 04/30/22 1257  GLUCAP 128* 150* 138* 130* 233*  D-Dimer No results for input(s): "DDIMER" in the last 72 hours. Hgb A1c No results for input(s): "HGBA1C" in the last 72 hours. Lipid Profile Recent Labs    04/29/22 0434  TRIG 37   Thyroid function studies No results for input(s): "TSH", "T4TOTAL", "T3FREE", "THYROIDAB" in the last 72 hours.  Invalid input(s): "FREET3" Anemia work up No results for input(s): "VITAMINB12", "FOLATE", "FERRITIN", "TIBC", "IRON", "RETICCTPCT" in the last 72 hours. Microbiology No results found for this or any previous visit (from the past 240 hour(s)).    Studies:  [place] EKG SITE RITE  Result Date: 04/29/2022 If Site Rite image not attached, placement could not be confirmed due to current cardiac rhythm.   Assessment: 71 y.o. 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 Peritoneal carcinomatosis, likely GI primary, on maintenance chemotherapy with oral Xeloda History of PE in August 2020 Mild anemia Hypertension  and chronic diastolic CHF    Plan:  -Abdominal pain, nausea, vomiting improved. -She has been started on TPN and will go home on this. -PICC line placed last evening.  Will initiate FOLFOX today. -Chemotherapy will end on Thursday and may be able to go home following completion of chemotherapy.   [name], NP 04/30/2022  1:16 PM  Addendum  I have seen the patient, examined her. I agree with the assessment and and plan and have edited the notes.   Chemo started today, she is tolerating well. Lab reviewed. She is clinical stable, one episode of vomiting this morning, no BM or much flatus. Plan to let her finish 5-fu 2 days infusion in hospital. Continue TPN and supportive care.   [name]  04/30/2022

## 2022-04-30 NOTE — Progress Notes (Signed)
PHARMACY - TOTAL PARENTERAL NUTRITION CONSULT NOTE   Indication: Small bowel obstruction  Patient Measurements: Height: '5\' 7"'  (170.2 cm) Weight: 83.5 kg (184 lb) IBW/kg (Calculated) : 61.6 TPN AdjBW (KG): 67.1 Body mass index is 28.82 kg/m.  Assessment: Pharmacy is consulted to dose TPN for this 4 yoF that was admitted on 8/3 with small bowel obstruction - per CCS, very likely a malignant obstruction secondary to progression of her known carcinomatosis, and a surgical intervention is not likely to help her. NG decompression was not successful.  She will be at high risk for refeeding due to very limited / No oral intake for 4-5 days prior to admission.  Glucose / Insulin: no hx DM - on sSSI q6h (used 4 units in the past 24 hrs) - CBG (goal <150): 130-151 Electrolytes: All WNL including CorrCa 9.76.   Note that Na is trending down to low end of normal limit. Renal: Scr slightly elevated at 1.06/BUN WNL Hepatic: LFTs, Tbili, Alk Phos, Alb WNL Intake / Output; MIVF:  - I/O: +923 mL - D5NS at [place]:  -8/3 CT Abd: Consistent with SBO GI Surgeries / Procedures:   Central access: Implanted port TPN start date: 8/5  Nutritional Goals: Goal TPN rate is 90 mL/hr (provides 97 g of protein and 2030 kcals per day) - Travasol 10% 45g/L - Dextrose 15% - SMOFlipid 25 g/L  RD Assessment:  (8/4) Estimated Needs Total Energy Estimated Needs: 1900-2100 Total Protein Estimated Needs: 90-100g Total Fluid Estimated Needs: 2L/day  Current Nutrition:  NPO TPN  Plan:  Continue TPN at goal rate of 90 mL/hr at 1800 Electrolytes in TPN:  Na 70 mEq/L (increased) K 55 mEq/L Ca 4 mEq/L Mg 5 mEq/L Phos 15 mmol/L Cl:Ac 1:1 Add standard MVI and trace elements to TPN Continue Sensitive q6h SSI and adjust as needed [facility] at [facility]. Further adjustment per MD. BMET on 05/01/22 Monitor TPN labs on Mon/Thurs.   Thank you for allowing pharmacy to be a part of this patient's care.  Royetta Asal, PharmD, BCPS Clinical Pharmacist [place] Please utilize Amion for appropriate phone number to reach the unit pharmacist ([place]) 04/30/2022 8:21 AM

## 2022-05-01 DIAGNOSIS — K56609 Unspecified intestinal obstruction, unspecified as to partial versus complete obstruction: Secondary | ICD-10-CM | POA: Diagnosis not present

## 2022-05-01 LAB — COMPREHENSIVE METABOLIC PANEL
ALT: 11 U/L (ref 0–44)
AST: 14 U/L — ABNORMAL LOW (ref 15–41)
Albumin: 3.4 g/dL — ABNORMAL LOW (ref 3.5–5.0)
Alkaline Phosphatase: 72 U/L (ref 38–126)
Anion gap: 9 (ref 5–15)
BUN: 23 mg/dL (ref 8–23)
CO2: 23 mmol/L (ref 22–32)
Calcium: 8.9 mg/dL (ref 8.9–10.3)
Chloride: 101 mmol/L (ref 98–111)
Creatinine, Ser: 0.99 mg/dL (ref 0.44–1.00)
GFR, Estimated: >60 mL/min (ref >60)
Glucose, Bld: 202 mg/dL — ABNORMAL HIGH (ref 70–99)
Potassium: 4.7 mmol/L (ref 3.5–5.1)
Sodium: 133 mmol/L — ABNORMAL LOW (ref 135–145)
Total Bilirubin: 0.4 mg/dL (ref 0.3–1.2)
Total Protein: 7.3 g/dL (ref 6.5–8.1)

## 2022-05-01 LAB — GLUCOSE, CAPILLARY
Glucose-Capillary: 167 mg/dL — ABNORMAL HIGH (ref 70–99)
Glucose-Capillary: 168 mg/dL — ABNORMAL HIGH (ref 70–99)
Glucose-Capillary: 193 mg/dL — ABNORMAL HIGH (ref 70–99)
Glucose-Capillary: 216 mg/dL — ABNORMAL HIGH (ref 70–99)

## 2022-05-01 LAB — CBC
HCT: 32.4 % — ABNORMAL LOW (ref 36.0–46.0)
Hemoglobin: 11.4 g/dL — ABNORMAL LOW (ref 12.0–15.0)
MCH: 38.3 pg — ABNORMAL HIGH (ref 26.0–34.0)
MCHC: 35.2 g/dL (ref 30.0–36.0)
MCV: 108.7 fL — ABNORMAL HIGH (ref 80.0–100.0)
Platelets: 279 10*3/uL (ref 150–400)
RBC: 2.98 MIL/uL — ABNORMAL LOW (ref 3.87–5.11)
RDW: 13.7 % (ref 11.5–15.5)
WBC: 15.5 10*3/uL — ABNORMAL HIGH (ref 4.0–10.5)
nRBC: 0 % (ref 0.0–0.2)

## 2022-05-01 LAB — MAGNESIUM: Magnesium: 2 mg/dL (ref 1.7–2.4)

## 2022-05-01 MED ORDER — TRAVASOL 10 % IV SOLN
INTRAVENOUS | Status: AC
  Filled 2022-05-01: qty 972

## 2022-05-01 MED ORDER — INSULIN ASPART 100 UNIT/ML IJ SOLN
0.0000 [IU] | Freq: Four times a day (QID) | INTRAMUSCULAR | Status: DC
  Administered 2022-05-01 (×3): 3 [IU] via SUBCUTANEOUS
  Administered 2022-05-02: 2 [IU] via SUBCUTANEOUS
  Administered 2022-05-02: 3 [IU] via SUBCUTANEOUS
  Administered 2022-05-03 (×2): 2 [IU] via SUBCUTANEOUS
  Administered 2022-05-03: 3 [IU] via SUBCUTANEOUS

## 2022-05-01 NOTE — Progress Notes (Signed)
PROGRESS NOTE  Brenda Stewart  CHY:[id number] DOB: Aug 22, 1951 DOA: 04/25/2022 PCP: Pcp, No   Brief Narrative: Patient is a 71 year old female with history of PE, hypertension, chronic diastolic CHF, CKD stage II, signet cell ring cell adenocarcinoma with peritoneal carcinomatosis probably from upper GI source currently on maintenance Xeloda presents here with abdomen pain, nausea, vomiting.  Imaging done on presentation showed small bowel obstruction likely secondary to progressive peritoneal carcinomatosis.  Currently on TPN, patient made NPO.  Oncology following here, started on chemotherapy.  Plan is to discharge her on TPN  Assessment & Plan:  Principal Problem:   Small bowel obstruction ([place]) Active Problems:   Neoplasm of peritoneum   Hypokalemia   History of pulmonary embolus (PE)   Essential hypertension   Chronic diastolic CHF (congestive heart failure) (HCC)   CKD (chronic kidney disease) stage 2, GFR 60-89 ml/min   Small bowel obstruction secondary to underlying progressive malignancy: CT abdomen/pelvis showed peritoneal wall thickening, implants suggesting progressive peritoneal carcinomatosis.  General surgery was consulted, now signed off.  Oncology also consulted.  Started on TPN.  Plan is to continue NPO for now.  Oncology started on FOLFOX chemotherapy, plan to end tomorrow. Patient thinks that she has a scant bowel movement.  Good bowel sounds heard.  Will check if she can be put on clear liquid diet on discharge.  History of PE: On Eliquis.  Hypokalemia: Being monitored and supplemented as needed  Macrocytic anemia: In the setting of malignancy.  Continue to monitor.  Hemoglobin stable  Hypertension: Hypertensive today.  On amlodipine at home but currently unable to take any medication due to SBO  Chronic diastolic CHF: Currently appears euvolemic.  Home diuretics on hold.  Leukocytosis: Most likely reactive, check tomorrow with CBC     Nutrition Problem:  Increased nutrient needs Etiology: cancer and cancer related treatments    DVT prophylaxis:SCDs Start: 04/25/22 2143 apixaban (ELIQUIS) tablet 5 mg     Code Status: Full Code  Family Communication: None at bedside  Patient status:Inpatient  Patient is from :Home  Anticipated discharge IN:[facility]  Estimated DC date:1-2 days   Consultants: Oncology  Procedures:None  Antimicrobials:  Anti-infectives (From admission, onward)    None       Subjective:  Patient seen and examined at the bedside this morning.  Looks very comfortable today, feels better.  Denies any significant pain, nausea or vomiting today.  Objective: Vitals:   04/30/22 1325 04/30/22 1737 04/30/22 1926 05/01/22 0439  BP: (!) 140/88 136/81 (!) 160/92 (!) 163/99  Pulse: 83 93 (!) 101 97  Resp: (!) '21 20 18 18  '$ Temp: 98.2 F (36.8 C) 98 F (36.7 C) 98.1 F (36.7 C) (!) 97.5 F (36.4 C)  TempSrc: Oral Oral Oral Oral  SpO2: 95% 96% 97% 98%  Weight:   86.6 kg   Height:        Intake/Output Summary (Last 24 hours) at 05/01/2022 1258 Last data filed at 05/01/2022 1100 Gross per 24 hour  Intake 3520.53 ml  Output --  Net 3520.53 ml   Filed Weights   04/25/22 1500 04/30/22 1926  Weight: 83.5 kg 86.6 kg    Examination:  General exam: Overall comfortable, not in distress HEENT: PERRL Respiratory system:  no wheezes or crackles  Cardiovascular system: S1 & S2 heard, RRR.  Gastrointestinal system: Abdomen is nondistended, soft and nontender.Good bowel sounds heard Central nervous system: Alert and oriented Extremities: No edema, no clubbing ,no cyanosis, PICC line Skin: No rashes, no  ulcers,no icterus     Data Reviewed: I have personally reviewed following labs and imaging studies  CBC: Recent Labs  Lab 04/25/22 1314 04/25/22 1604 04/26/22 0530 04/27/22 0259 04/28/22 0635 04/29/22 0434 04/30/22 0540 05/01/22 0635  WBC 8.1 7.6   < > 8.0 9.0 9.1 8.8 15.5*  NEUTROABS 5.8 5.5  --   --   --    --   --   --   HGB 12.4 11.9*   < > 11.0* 11.3* 11.0* 11.1* 11.4*  HCT 34.2* 33.6*   < > 31.5* 32.3* 31.9* 32.0* 32.4*  MCV 106.5* 109.4*   < > 110.9* 110.2* 109.6* 110.7* 108.7*  PLT 338 291   < > 276 337 275 254 279   < > = values in this interval not displayed.   Basic Metabolic Panel: Recent Labs  Lab 04/25/22 1604 04/26/22 0530 04/27/22 0259 04/28/22 4818 04/29/22 0434 04/30/22 0540 05/01/22 0635  NA 138   < > 139 139 138 136 133*  K 3.2*   < > 3.4* 3.8 4.0 4.3 4.7  CL 102   < > 107 108 107 106 101  CO2 24   < > '23 24 24 24 23  '$ GLUCOSE 118*   < > 128* 162* 151* 141* 202*  BUN 15   < > '11 11 16 20 23  '$ CREATININE 1.14*   < > 0.99 1.13* 1.06* 1.06* 0.99  CALCIUM 9.4   < > 9.0 9.2 9.2 9.0 8.9  MG 1.9  --  1.8 2.1 2.1 2.0 2.0  PHOS 3.1  --  3.3 2.8 3.6 3.9  --    < > = values in this interval not displayed.     No results found for this or any previous visit (from the past 240 hour(s)).   Radiology Studies: [place] EKG SITE RITE  Result Date: 04/29/2022 If Site Rite image not attached, placement could not be confirmed due to current cardiac rhythm.   Scheduled Meds:  amLODipine  5 mg Oral Daily   apixaban  5 mg Oral BID   Chlorhexidine Gluconate Cloth  6 each Topical Daily   fluorouracil (ADRUCIL) 4,500 mg in dextrose 5 % 1,000 mL chemo infusion  2,200 mg/m2 (Treatment Plan Recorded) Intravenous Once   insulin aspart  0-15 Units Subcutaneous Q6H   ondansetron  4 mg Oral QID   pantoprazole  40 mg Oral Daily   sodium chloride flush  10-40 mL Intracatheter Q12H   Continuous Infusions:  dextrose 5 % and 0.9% NaCl Stopped (04/27/22 1749)   TPN ADULT (ION) 90 mL/hr at 05/01/22 0318   TPN ADULT (ION)       LOS: 6 days   Shelly Coss, MD [facility] P8/05/2022, 12:58 PM

## 2022-05-01 NOTE — Progress Notes (Signed)
PHARMACY - TOTAL PARENTERAL NUTRITION CONSULT NOTE   Indication: Small bowel obstruction  Patient Measurements: Height: '5\' 7"'$  (170.2 cm) Weight: 86.6 kg (190 lb 14.7 oz) IBW/kg (Calculated) : 61.6 TPN AdjBW (KG): 67.1 Body mass index is 29.9 kg/m.  Assessment: Pharmacy is consulted to dose TPN for this 47 yoF that was admitted on 8/3 with small bowel obstruction - per CCS, very likely a malignant obstruction secondary to progression of her known carcinomatosis, and a surgical intervention is not likely to help her. NG decompression was not successful.  She will be at high risk for refeeding due to very limited / No oral intake for 4-5 days prior to admission.  - getting FOLFOX  8/8-8/9  Glucose / Insulin: no hx DM - on sSSI q6h (used 12 units in the past 24 hrs) - CBG (goal <150): elevated in the 200s with pt receiving decadron 10 mg IV x1 on 8/8 with chemo Electrolytes: Na down 133, K increased to 4.7; other lytes wnl Renal: Scr and BUN wnl Hepatic: AST slightly low at 14; Alb low at 3.4 Intake / Output; MIVF:  - I/O: +3345 mL - D5NS at [facility] GI Imaging:  -8/3 CT Abd: Consistent with SBO GI Surgeries / Procedures:   Central access: Implanted port TPN start date: 8/5  Nutritional Goals: Goal TPN rate is 90 mL/hr (provides 97 g of protein and 2030 kcals per day) - Travasol 10% 45g/L - Dextrose 15% - SMOFlipid 25 g/L  RD Assessment:  (8/4) Estimated Needs Total Energy Estimated Needs: 1900-2100 Total Protein Estimated Needs: 90-100g Total Fluid Estimated Needs: 2L/day  Current Nutrition:  NPO TPN  Plan:  Continue TPN at goal rate of 90 mL/hr at 1800 Electrolytes in TPN:  Increase Na to 150 mEq/L Reduce K to  45 mEq/L Ca 5 mEq/L Mg 5 mEq/L Phos 15 mmol/L Cl:Ac 1:1 Add standard MVI and trace elements to TPN Increase SSI to moderate q6h SSI MIVF at [facility]. Further adjustment per MD. Monitor TPN labs on Mon/Thurs.   Thank you for allowing pharmacy to be a part of  this patient's care.  Dia Sitter, PharmD, BCPS 05/01/2022 7:30 AM

## 2022-05-02 DIAGNOSIS — K56609 Unspecified intestinal obstruction, unspecified as to partial versus complete obstruction: Secondary | ICD-10-CM | POA: Diagnosis not present

## 2022-05-02 LAB — GLUCOSE, CAPILLARY
Glucose-Capillary: 159 mg/dL — ABNORMAL HIGH (ref 70–99)
Glucose-Capillary: 147 mg/dL — ABNORMAL HIGH (ref 70–99)
Glucose-Capillary: 119 mg/dL — ABNORMAL HIGH (ref 70–99)

## 2022-05-02 LAB — CBC
HCT: 34.4 % — ABNORMAL LOW (ref 36.0–46.0)
Hemoglobin: 11.8 g/dL — ABNORMAL LOW (ref 12.0–15.0)
MCH: 37.6 pg — ABNORMAL HIGH (ref 26.0–34.0)
MCHC: 34.3 g/dL (ref 30.0–36.0)
MCV: 109.6 fL — ABNORMAL HIGH (ref 80.0–100.0)
Platelets: 294 10*3/uL (ref 150–400)
RBC: 3.14 MIL/uL — ABNORMAL LOW (ref 3.87–5.11)
RDW: 14.3 % (ref 11.5–15.5)
WBC: 9.7 10*3/uL (ref 4.0–10.5)
nRBC: 0 % (ref 0.0–0.2)

## 2022-05-02 LAB — COMPREHENSIVE METABOLIC PANEL
ALT: 21 U/L (ref 0–44)
AST: 25 U/L (ref 15–41)
Albumin: 3.4 g/dL — ABNORMAL LOW (ref 3.5–5.0)
Alkaline Phosphatase: 79 U/L (ref 38–126)
Anion gap: 7 (ref 5–15)
BUN: 23 mg/dL (ref 8–23)
CO2: 25 mmol/L (ref 22–32)
Calcium: 8.9 mg/dL (ref 8.9–10.3)
Chloride: 102 mmol/L (ref 98–111)
Creatinine, Ser: 1.14 mg/dL — ABNORMAL HIGH (ref 0.44–1.00)
GFR, Estimated: 52 mL/min — ABNORMAL LOW (ref >60)
Glucose, Bld: 164 mg/dL — ABNORMAL HIGH (ref 70–99)
Potassium: 4.5 mmol/L (ref 3.5–5.1)
Sodium: 134 mmol/L — ABNORMAL LOW (ref 135–145)
Total Bilirubin: 0.5 mg/dL (ref 0.3–1.2)
Total Protein: 7 g/dL (ref 6.5–8.1)

## 2022-05-02 LAB — PHOSPHORUS: Phosphorus: 4.7 mg/dL — ABNORMAL HIGH (ref 2.5–4.6)

## 2022-05-02 LAB — MAGNESIUM: Magnesium: 2.1 mg/dL (ref 1.7–2.4)

## 2022-05-02 LAB — TRIGLYCERIDES: Triglycerides: 47 mg/dL (ref <150)

## 2022-05-02 MED ORDER — TRAVASOL 10 % IV SOLN
INTRAVENOUS | Status: DC
  Filled 2022-05-02: qty 972

## 2022-05-02 NOTE — Care Management Important Message (Signed)
Important Message  Patient Details IM Letter given to the Patient. Name: Brenda Stewart MRN: [id number] Date of Birth: [birth year]   Medicare Important Message Given:  Yes     [name] 05/02/2022, 10:00 AM

## 2022-05-02 NOTE — Progress Notes (Signed)
Pharmacy: Eliquis  Patient is a 71 y.o F with peritoneal carcinomatosis and hx PE on Eliquis PTA who presented to [facility] on 04/25/22 after SBO was noted on an outpatient abd x-ray.  She was started on heparin drip on admission, changed to lovenox 04/27/22 and transitioned back to Eliquis on 04/29/22.    Today, 05/02/2022: - scr 1.14 (crcl~52) - cbc ok - no bleeding documented  Plan: - continue Eliquis 5 mg bid - pharmacy will sign off for Eliquis but will follow patient peripherally along with you.  Dia Sitter, PharmD, BCPS 05/02/2022 11:24 AM

## 2022-05-02 NOTE — Progress Notes (Addendum)
Brenda Stewart   DOB:[birth year]   HE#:[id number]   NTI#:[id number]  ONCOLOGY FOLLOW UP   Subjective: Tolerating chemotherapy well overall.  She denies mucositis.  Abdominal pain controlled.  Reports mild nausea and has vomited on 1 occasion.  Remains on TPN.  5-FU infusion due to complete later today.  Objective:  Vitals:   05/01/22 2033 05/02/22 0538  BP: 123/80 113/82  Pulse: 92 (!) 107  Resp: 20 17  Temp: 98.2 F (36.8 C) 98.1 F (36.7 C)  SpO2: 96% 98%    Body mass index is 29.9 kg/m.  Intake/Output Summary (Last 24 hours) at 05/02/2022 1033 Last data filed at 05/02/2022 0330 Gross per 24 hour  Intake 1417.47 ml  Output 210 ml  Net 1207.47 ml     Sclerae unicteric  Oropharynx clear  No peripheral adenopathy  Lungs clear -- no rales or rhonchi  Heart regular rate and rhythm  Abdomen benign  MSK no focal spinal tenderness, no peripheral edema  Neuro nonfocal   CBG (last 3)  Recent Labs    05/01/22 1748 05/01/22 2340 05/02/22 0535  GLUCAP 167* 193* 159*     Labs:  Urine Studies No results for input(s): "UHGB", "CRYS" in the last 72 hours.  Invalid input(s): "UACOL", "UAPR", "USPG", "UPH", "UTP", "UGL", "UKET", "UBIL", "UNIT", "UROB", "ULEU", "UEPI", "UWBC", "URBC", "UBAC", "CAST", "UCOM", "BILUA"  Basic Metabolic Panel: Recent Labs  Lab 04/27/22 0259 04/28/22 0635 04/29/22 0434 04/30/22 0540 05/01/22 0635 05/02/22 0550  NA 139 139 138 136 133* 134*  K 3.4* 3.8 4.0 4.3 4.7 4.5  CL 107 108 107 106 101 102  CO2 '23 24 24 24 23 25  '$ GLUCOSE 128* 162* 151* 141* 202* 164*  BUN '11 11 16 20 23 23  '$ CREATININE 0.99 1.13* 1.06* 1.06* 0.99 1.14*  CALCIUM 9.0 9.2 9.2 9.0 8.9 8.9  MG 1.8 2.1 2.1 2.0 2.0 2.1  PHOS 3.3 2.8 3.6 3.9  --  4.7*   GFR Estimated Creatinine Clearance: 52.6 mL/min (A) (by C-G formula based on SCr of 1.14 mg/dL (H)). Liver Function Tests: Recent Labs  Lab 04/27/22 0259 04/28/22 0635 04/29/22 0434 05/01/22 0635 05/02/22 0550  AST  12* 13* 13* 14* 25  ALT '9 10 9 11 21  '$ ALKPHOS 69 70 66 72 79  BILITOT 0.7 0.4 0.5 0.4 0.5  PROT 7.1 7.0 7.1 7.3 7.0  ALBUMIN 3.4* 3.5 3.3* 3.4* 3.4*   Recent Labs  Lab 04/25/22 1604  LIPASE 34   No results for input(s): "AMMONIA" in the last 168 hours. Coagulation profile No results for input(s): "INR", "PROTIME" in the last 168 hours.  CBC: Recent Labs  Lab 04/25/22 1314 04/25/22 1604 04/26/22 0530 04/28/22 0635 04/29/22 0434 04/30/22 0540 05/01/22 0635 05/02/22 0550  WBC 8.1 7.6   < > 9.0 9.1 8.8 15.5* 9.7  NEUTROABS 5.8 5.5  --   --   --   --   --   --   HGB 12.4 11.9*   < > 11.3* 11.0* 11.1* 11.4* 11.8*  HCT 34.2* 33.6*   < > 32.3* 31.9* 32.0* 32.4* 34.4*  MCV 106.5* 109.4*   < > 110.2* 109.6* 110.7* 108.7* 109.6*  PLT 338 291   < > 337 275 254 279 294   < > = values in this interval not displayed.   Cardiac Enzymes: Recent Labs  Lab 04/25/22 1604  CKTOTAL 61   BNP: Invalid input(s): "POCBNP" CBG: Recent Labs  Lab 05/01/22 0611 05/01/22 1116 05/01/22  1748 05/01/22 2340 05/02/22 0535  GLUCAP 216* 168* 167* 193* 159*   D-Dimer No results for input(s): "DDIMER" in the last 72 hours. Hgb A1c No results for input(s): "HGBA1C" in the last 72 hours. Lipid Profile Recent Labs    05/02/22 0550  TRIG 47   Thyroid function studies No results for input(s): "TSH", "T4TOTAL", "T3FREE", "THYROIDAB" in the last 72 hours.  Invalid input(s): "FREET3" Anemia work up No results for input(s): "VITAMINB12", "FOLATE", "FERRITIN", "TIBC", "IRON", "RETICCTPCT" in the last 72 hours. Microbiology No results found for this or any previous visit (from the past 240 hour(s)).    Studies:  No results found.  Assessment: 71 y.o. 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 -Currently on TPN and current plan is to continue this upon hospital discharge. -PICC line placed for chemotherapy and she is completing FOLFOX today.  Will be seen later today by Dr. Burr Medico but may be able to go home today following completion of chemotherapy.  I would recommend keeping the PICC line in place for future cycles of chemotherapy as her Port-A-Cath will be used for TPN. -Continue supportive care for her abdominal pain, nausea, vomiting.  [name], NP 05/02/2022  10:33 AM  Addendum I have seen the patient, examined her. I agree with the assessment and and plan and have edited the notes.   Brenda Stewart is clinically stable, pain has improved, nausea is minimal with antiemetics.  She still does not have bowel movement or flatus.  She completed chemotherapy today and tolerated well.  Plan to discharge her tomorrow with TPN, stay n.p.o. except water for medication.  Will schedule her follow-up in our office next week, next cycle chemo in 2 weeks.  [name]  05/02/2022

## 2022-05-02 NOTE — Progress Notes (Signed)
PHARMACY - TOTAL PARENTERAL NUTRITION CONSULT NOTE   Indication: Small bowel obstruction  Patient Measurements: Height: '5\' 7"'$  (170.2 cm) Weight: 86.6 kg (190 lb 14.7 oz) IBW/kg (Calculated) : 61.6 TPN AdjBW (KG): 67.1 Body mass index is 29.9 kg/m.  Assessment: Pharmacy is consulted to dose TPN for this 30 yoF that was admitted on 8/3 with small bowel obstruction - per [facility], very likely a malignant obstruction secondary to progression of her known carcinomatosis, and a surgical intervention is not likely to help her. NG decompression was not successful.  She will be at high risk for refeeding due to very limited / No oral intake for 4-5 days prior to admission.  - getting FOLFOX  8/8-8/9  Glucose / Insulin: no hx DM - on mSSI q6h (used 12 units in the past 24 hrs) - CBG (goal <150): improved 159-193 (decadron 10 mg IV x1 on 8/8 with chemo) Electrolytes: Na low 134 (max Na conc in TPN), phos elevated at 4.7; other lytes wnl Renal: Scr up slightly to 1.14, BUN wnl Hepatic: AST/ALT wnl -  Alb low at 3.4 - TG wnl Intake / Output; MIVF:  - I/O: +1207 mL - D5NS at [facility] GI Imaging:  -8/3 CT Abd: Consistent with SBO GI Surgeries / Procedures:   Central access: Implanted port TPN start date: 8/5  Nutritional Goals: Goal TPN rate is 90 mL/hr (provides 97 g of protein and 2030 kcals per day) - Travasol 10% 45g/L - Dextrose 15% - SMOFlipid 25 g/L  RD Assessment:  (8/4) Estimated Needs Total Energy Estimated Needs: 1900-2100 Total Protein Estimated Needs: 90-100g Total Fluid Estimated Needs: 2L/day  Current Nutrition:  NPO TPN  Plan:  Continue TPN at goal rate of 90 mL/hr at 1800 Electrolytes in TPN:  Na 150 mEq/L K 45 mEq/L Ca 5 mEq/L Mg 5 mEq/L No Phos  Cl:Ac 1:1 Add standard MVI and trace elements to TPN Continue mSSI q6h M[facility]F at [facility]. Further adjustment per MD. Bmet, phos, mag on 8/11 Monitor TPN labs on Mon/Thurs.   Thank you for allowing pharmacy to be a part of  this patient's care.  Dia Sitter, PharmD, BCPS 05/02/2022 7:19 AM

## 2022-05-02 NOTE — Progress Notes (Signed)
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  Imaging done on presentation showed small bowel obstruction likely secondary to progressive peritoneal carcinomatosis.  Currently on TPN, patient made NPO.  Oncology following here, started on chemotherapy.  Plan is to discharge her on TPN and NPO status after completion of chemo.  Assessment & Plan:  Principal Problem:   Small bowel obstruction (HCC) Active Problems:   Neoplasm of peritoneum   Hypokalemia   History of pulmonary embolus (PE)   Essential hypertension   Chronic diastolic CHF (congestive heart failure) (HCC)   CKD (chronic kidney disease) stage 2, GFR 60-89 ml/min   Small bowel obstruction secondary to underlying progressive malignancy: CT abdomen/pelvis showed peritoneal wall thickening, implants suggesting progressive peritoneal carcinomatosis.  General surgery was consulted, now signed off.  Oncology also consulted.  Started on TPN.  Plan is to continue NPO for now.  Oncology started on FOLFOX chemotherapy, plan to end today. Patient thinks that she has a scant bowel movement.  Good bowel sounds heard.  Oncology recommended to discharge her on TPN when she is ready, she will continue n.p.o. for next few weeks.  History of PE: On Eliquis.  Hypokalemia: Being monitored and supplemented as needed  Macrocytic anemia: In the setting of malignancy.  Continue to monitor.  Hemoglobin stable  Hypertension: Currently normotensive,on amlod  Chronic diastolic CHF: Currently appears euvolemic.  Home diuretics on hold.  Leukocytosis: Most likely reactive, resolved    Nutrition Problem:  Increased nutrient needs Etiology: cancer and cancer related treatments    DVT prophylaxis:SCDs Start: 04/25/22 2143 apixaban (ELIQUIS) tablet 5 mg     Code Status: Full Code  Family Communication: None at bedside  Patient status:Inpatient  Patient is from :Home  Anticipated discharge VZ:DGLO  Estimated DC date: Today or tomorrow  Consultants: Oncology  Procedures:None  Antimicrobials:  Anti-infectives (From admission, onward)    None       Subjective:  Patient seen and examined at the bedside this morning.  Hemodynamically stable without any complaints.  Denies nausea, vomiting or abdominal pain.  Objective: Vitals:   05/01/22 1347 05/01/22 2033 05/02/22 0538 05/02/22 1340  BP: 137/81 123/80 113/82 120/78  Pulse: 88 92 (!) 107 90  Resp: '16 20 17 16  '$ Temp: 98.2 F (36.8 C) 98.2 F (36.8 C) 98.1 F (36.7 C) 98.6 F (37 C)  TempSrc: Oral Oral Oral Oral  SpO2: 98% 96% 98% 97%  Weight:      Height:        Intake/Output Summary (Last 24 hours) at 05/02/2022 1438 Last data filed at 05/02/2022 0330 Gross per 24 hour  Intake 1242.47 ml  Output 210 ml  Net 1032.47 ml   Filed Weights   04/25/22 1500 04/30/22 1926  Weight: 83.5 kg 86.6 kg    Examination:   General exam: Overall comfortable, not in distress, pleasant female HEENT: PERRL Respiratory system:  no wheezes or crackles  Cardiovascular system: S1 & S2 heard, RRR.  Gastrointestinal system: Abdomen is nondistended, soft and nontender.  Bowel sounds heard Central nervous system: Alert and oriented Extremities: No edema, no clubbing ,no cyanosis, PICC line Skin: No rashes, no ulcers,no icterus     Data Reviewed:  I have personally reviewed following labs and imaging studies  CBC: Recent Labs  Lab 04/25/22 1604 04/26/22 0530 04/28/22 0635 04/29/22 0434 04/30/22 0540 05/01/22 0635 05/02/22 0550  WBC 7.6   < > 9.0 9.1 8.8 15.5* 9.7  NEUTROABS 5.5  --   --   --   --   --   --   HGB 11.9*    < > 11.3* 11.0* 11.1* 11.4* 11.8*  HCT 33.6*   < > 32.3* 31.9* 32.0* 32.4* 34.4*  MCV 109.4*   < > 110.2* 109.6* 110.7* 108.7* 109.6*  PLT 291   < > 337 275 254 279 294   < > = values in this interval not displayed.   Basic Metabolic Panel: Recent Labs  Lab 04/27/22 0259 04/28/22 0635 04/29/22 0434 04/30/22 0540 05/01/22 0635 05/02/22 0550  NA 139 139 138 136 133* 134*  K 3.4* 3.8 4.0 4.3 4.7 4.5  CL 107 108 107 106 101 102  CO2 '23 24 24 24 23 25  '$ GLUCOSE 128* 162* 151* 141* 202* 164*  BUN '11 11 16 20 23 23  '$ CREATININE 0.99 1.13* 1.06* 1.06* 0.99 1.14*  CALCIUM 9.0 9.2 9.2 9.0 8.9 8.9  MG 1.8 2.1 2.1 2.0 2.0 2.1  PHOS 3.3 2.8 3.6 3.9  --  4.7*     No results found for this or any previous visit (from the past 240 hour(s)).   Radiology Studies: No results found.  Scheduled Meds:  amLODipine  5 mg Oral Daily   apixaban  5 mg Oral BID   Chlorhexidine Gluconate Cloth  6 each Topical Daily   insulin aspart  0-15 Units Subcutaneous Q6H   ondansetron  4 mg Oral QID   pantoprazole  40 mg Oral Daily   sodium chloride flush  10-40 mL Intracatheter Q12H   Continuous Infusions:  dextrose 5 % and 0.9% NaCl Stopped (04/27/22 1749)   TPN ADULT (ION) 90 mL/hr at 05/02/22 0330   TPN ADULT (ION)       LOS: 7 days   Shelly Coss, MD [facility] P8/06/2022, 2:38 PM

## 2022-05-03 ENCOUNTER — Other Ambulatory Visit: Payer: Self-pay

## 2022-05-03 LAB — BASIC METABOLIC PANEL
Anion gap: 5 (ref 5–15)
BUN: 25 mg/dL — ABNORMAL HIGH (ref 8–23)
CO2: 29 mmol/L (ref 22–32)
Calcium: 8.9 mg/dL (ref 8.9–10.3)
Chloride: 104 mmol/L (ref 98–111)
Creatinine, Ser: 0.93 mg/dL (ref 0.44–1.00)
GFR, Estimated: >60 mL/min (ref >60)
Glucose, Bld: 155 mg/dL — ABNORMAL HIGH (ref 70–99)
Potassium: 4.6 mmol/L (ref 3.5–5.1)
Sodium: 138 mmol/L (ref 135–145)

## 2022-05-03 LAB — GLUCOSE, CAPILLARY
Glucose-Capillary: 127 mg/dL — ABNORMAL HIGH (ref 70–99)
Glucose-Capillary: 141 mg/dL — ABNORMAL HIGH (ref 70–99)
Glucose-Capillary: 154 mg/dL — ABNORMAL HIGH (ref 70–99)

## 2022-05-03 LAB — MAGNESIUM: Magnesium: 2.2 mg/dL (ref 1.7–2.4)

## 2022-05-03 LAB — PHOSPHORUS: Phosphorus: 3.7 mg/dL (ref 2.5–4.6)

## 2022-05-03 MED ORDER — HEPARIN SOD (PORK) LOCK FLUSH 100 UNIT/ML IV SOLN
500.0000 [IU] | Freq: Once | INTRAVENOUS | Status: AC
Start: 2022-05-03 — End: 2022-05-03
  Administered 2022-05-03: 500 [IU] via INTRAVENOUS
  Filled 2022-05-03: qty 5

## 2022-05-03 NOTE — Discharge Summary (Signed)
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  Imaging done on presentation showed small bowel obstruction likely secondary to progressive peritoneal carcinomatosis.  Currently on TPN, patient made NPO.  Oncology following here, started on chemotherapy which she completed on 8/10.  Plan is to discharge her on TPN and NPO status .  She will follow-up with oncology as an outpatient.  Following problems were addressed during her hospitalization:  Small bowel obstruction secondary to underlying progressive malignancy: CT abdomen/pelvis showed peritoneal wall thickening, implants suggesting progressive peritoneal carcinomatosis.  General surgery was consulted, now signed off.  Oncology also consulted.  Started on TPN.  Plan is to continue NPO for now.  Oncology started on FOLFOX chemotherapy, completed now. Oncology recommended to discharge her on TPN when she is ready, she will continue n.p.o. for next few weeks.   History of PE: On Eliquis.   Hypokalemia: Supplemented   Macrocytic anemia: In the setting of malignancy.  Continue to monitor.  Hemoglobin stable   Hypertension: Currently normotensive,on amlod   Chronic diastolic CHF: Currently appears euvolemic. Takes lasix at home   Leukocytosis: Most likely reactive, resolved    Discharge Diagnoses:  Principal Problem:   Small bowel obstruction ([place]) Active Problems:   Neoplasm of peritoneum    Hypokalemia   History of pulmonary embolus (PE)   Essential hypertension   Chronic diastolic CHF (congestive heart failure) (HCC)   CKD (chronic kidney disease) stage 2, GFR 60-89 ml/min    Discharge Instructions  Discharge Instructions     Discharge instructions   Complete by: As directed    1)Please follow up with your oncologist as an outpatient 2) Follow up with home health   Increase activity slowly   Complete by: As directed    TREATMENT CONDITIONS   Complete by: As directed    Patient should have CBC & CMP within 7 days prior to chemotherapy administration. NOTIFY MD IF: ANC < 1500, Hemoglobin < 8, PLT < 75,000,  Total Bili > 1.5, Creatinine > 1.5, ALT & AST > 80 or if patient has unstable vital signs: Temperature > 38.5, SBP > 180 or < 90, RR > 30 or HR > 100.   TREATMENT CONDITIONS   Complete by: As directed    Patient should have CBC & CMP within 7 days prior to chemotherapy administration. NOTIFY MD IF: ANC < 1500, Hemoglobin < 8, PLT < 75,000,  Total Bili > 1.5, Creatinine > 1.5, ALT & AST > 80 or if patient has unstable vital signs: Temperature > 38.5, SBP > 180 or < 90, RR > 30 or HR > 100.      Allergies as of 05/03/2022   No Known Allergies      Medication List     STOP taking these medications    lidocaine-prilocaine cream Commonly known as: EMLA   lisinopril 40 MG tablet Commonly known as: ZESTRIL       TAKE these medications    amLODipine 10 MG tablet Commonly known as: NORVASC Take 5 mg by mouth daily.  Take 1 tab by mouth daily (Hold Dose for Systolic BP Less than 419; Avoid Grapefruit or its juice)   aspirin EC 81 MG tablet Take 81 mg by mouth daily. Swallow whole.   capecitabine 500 MG tablet Commonly known as: XELODA Take 4 tablets every 12 hours,  for 14 days on, 7 days off. Repeat every 21 days. Take after a meal What changed:  how much to take how to take this when to take this additional instructions   Eliquis 5 MG Tabs  tablet Generic drug: apixaban TAKE 1 TABLET BY MOUTH TWICE DAILY   furosemide 20 MG tablet Commonly known as: LASIX TAKE 1 TABLET BY MOUTH ONCE DAILY ON MONDAY, WEDNESDAY, AND FRIDAY. TAKE WITH POTASSIUM What changed: See the new instructions.   potassium chloride SA 20 MEQ tablet Commonly known as: Klor-Con M20 TAKE 1 TABLET BY MOUTH ONCE DAILY ON MONDAY, WEDNESDAY, AND FRIDAY. TAKE WITH LASIX What changed:  how much to take how to take this when to take this additional instructions   prochlorperazine 10 MG tablet Commonly known as: COMPAZINE Take 1 tablet (10 mg total) by mouth every 6 (six) hours as needed for nausea or vomiting. What changed: when to take this   sennosides-docusate sodium 8.6-50 MG tablet Commonly known as: SENOKOT-S Take 2 tablets by mouth daily as needed for constipation.   VITAMIN B-12 PO Take 1 capsule by mouth daily.   vitamin C 250 MG tablet Commonly known as: ASCORBIC ACID Take 250 mg by mouth daily.   Vitamin D3 50 MCG (2000 UT) capsule Take 2,000 Units by mouth daily.        Follow-up [place], [facility] Follow up.   Why: [place] information: [place] [id number] [phone]         [facility] Follow up.   Why: Carolynn Sayers rep-infusion [phone]               No Known Allergies  Consultations: Oncology   Procedures/Studies: [place] EKG SITE RITE  Result Date: 04/29/2022 If Site Rite image not attached, placement could not be confirmed due to current cardiac rhythm.  DG Abd Portable 2V  Result Date: 04/28/2022 CLINICAL DATA:  Abdominal pain with nausea. EXAM: PORTABLE ABDOMEN - 2 VIEW COMPARISON:  04/26/2022 FINDINGS: Upright film shows no evidence for intraperitoneal free air. Mild gaseous small bowel distention in the left upper quadrant is similar at 3.8 cm diameter today compared to 3.2 cm previously. Degenerative changes noted lower lumbar  spine. IMPRESSION: Mild gaseous small bowel distention in the left upper quadrant, similar to prior. Electronically Signed   By: Misty Stanley M.D.   On: 04/28/2022 12:54   DG Abd 1 View  Result Date: 04/26/2022 CLINICAL DATA:  Small bowel obstruction EXAM: ABDOMEN - 1 VIEW COMPARISON:  CT and radiography from yesterday FINDINGS: Unchanged gas filled loops of bowel in the central abdomen measuring up to 3.1 cm. Ascites seen to depressed the bladder. No evidence of pneumoperitoneum. IMPRESSION: Partial small bowel obstruction by recent CT. Unchanged bowel gas pattern. Electronically Signed   By: Jorje Guild M.D.   On: 04/26/2022 05:52   CT ABDOMEN PELVIS W CONTRAST  Result Date: 04/25/2022 CLINICAL DATA:  Bowel obstruction suspected. Vomiting. Patient presents from the cancer center. EXAM: CT ABDOMEN AND PELVIS WITH CONTRAST TECHNIQUE: Multidetector CT imaging of the abdomen and pelvis was performed using the standard protocol following bolus administration  of intravenous contrast. RADIATION DOSE REDUCTION: This exam was performed according to the departmental dose-optimization program which includes automated exposure control, adjustment of the mA and/or kV according to patient size and/or use of iterative reconstruction technique. CONTRAST:  131m OMNIPAQUE IOHEXOL 300 MG/ML  SOLN COMPARISON:  01/08/2022 FINDINGS: Lower chest: Mild dependent atelectasis in the lung bases. Small esophageal hiatal hernia. Hepatobiliary: No focal liver abnormality is seen. No gallstones, gallbladder wall thickening, or biliary dilatation. Pancreas: Unremarkable. No pancreatic ductal dilatation or surrounding inflammatory changes. Spleen: Normal in size without focal abnormality. Adrenals/Urinary Tract: Adrenal glands are unremarkable. Kidneys are normal, without renal calculi, focal lesion, or hydronephrosis. Bladder is unremarkable. Stomach/Bowel: Mild distention of small-bowel loops with gas and fluid. Mild small bowel  wall thickening is suggested. Fecalization of small bowel contents. Decompression of the terminal ileum with transition zone suggested in the right lower quadrant. Decompressed stool-filled colon. Appendix is not identified. Vascular/Lymphatic: Aortic atherosclerosis. No enlarged abdominal or pelvic lymph nodes. Mesenteric vessels appear patent. Reproductive: Status post hysterectomy. No adnexal masses. Other: Moderate diffuse free fluid throughout the abdomen and pelvis. Nodular stranding in the mesentery and omentum. Peritoneal wall thickening. Changes are consistent with peritoneal carcinomatosis. Appearances have progressed since the previous study. No free air. Abdominal wall musculature appears intact. Musculoskeletal: Degenerative changes.  No acute bony abnormalities. IMPRESSION: 1. Moderate free fluid throughout the abdomen and pelvis with peritoneal wall thickening and implant suggesting peritoneal carcinomatosis, progressing since prior study. 2. Mildly dilated small bowel with wall thickening and fecalization of contents. Decompressed terminal ileum with transition zone in the right lower quadrant. Appearance is consistent with small-bowel obstruction. 3. Small esophageal hiatal hernia. 4. Aortic atherosclerosis. Electronically Signed   By: WLucienne CapersM.D.   On: 04/25/2022 17:23   DG Abd 2 Views  Result Date: 04/25/2022 CLINICAL DATA:  recurrent abdominal pain and nausea, rule out bowel obstruction EXAM: ABDOMEN - 2 VIEW COMPARISON:  CT 01/08/2022 FINDINGS: There are multiple mildly dilated loops of small bowel with air-fluid level. Mild-to-moderate stool burden. No radiopaque calculi overlie the kidneys. IMPRESSION: Multiple mildly dilated loops of small bowel with air-fluid levels. Findings could potentially represent a small-bowel obstruction. Consider CT. These results will be called to the ordering clinician or representative by the Radiologist Assistant, and communication documented in the  PACS or [employer] Electronically Signed   By: JMaurine SimmeringM.D.   On: 04/25/2022 13:15      Subjective: Patient seen and examined at the bedside today.  No nausea, vomiting or abdominal pain.  eager to go home today.  Discharge Exam: Vitals:   05/02/22 2235 05/03/22 0622  BP: (!) 140/78 126/72  Pulse: 97 91  Resp: 18 16  Temp: 98.7 F (37.1 C) 98.5 F (36.9 C)  SpO2: 96% 97%   Vitals:   05/02/22 0538 05/02/22 1340 05/02/22 2235 05/03/22 0622  BP: 113/82 120/78 (!) 140/78 126/72  Pulse: (!) 107 90 97 91  Resp: '17 16 18 16  '$ Temp: 98.1 F (36.7 C) 98.6 F (37 C) 98.7 F (37.1 C) 98.5 F (36.9 C)  TempSrc: Oral Oral Oral Oral  SpO2: 98% 97% 96% 97%  Weight:      Height:        General: Pt is alert, awake, not in acute distress Cardiovascular: RRR, S1/S2 +, no rubs, no gallops Respiratory: CTA bilaterally, no wheezing, no rhonchi Abdominal: Soft, NT, ND, bowel sounds + Extremities: no edema, no cyanosis, PICC line on the right arm  The results of significant diagnostics from this hospitalization (including imaging, microbiology, ancillary and laboratory) are listed below for reference.     Microbiology: No results found for this or any previous visit (from the past 240 hour(s)).   Labs: BNP (last 3 results) No results for input(s): "BNP" in the last 8760 hours. Basic Metabolic Panel: Recent Labs  Lab 04/28/22 0635 04/29/22 0434 04/30/22 0540 05/01/22 0635 05/02/22 0550 05/03/22 0506  NA 139 138 136 133* 134* 138  K 3.8 4.0 4.3 4.7 4.5 4.6  CL 108 107 106 101 102 104  CO2 '24 24 24 23 25 29  '$ GLUCOSE 162* 151* 141* 202* 164* 155*  BUN '11 16 20 23 23 '$ 25*  CREATININE 1.13* 1.06* 1.06* 0.99 1.14* 0.93  CALCIUM 9.2 9.2 9.0 8.9 8.9 8.9  MG 2.1 2.1 2.0 2.0 2.1 2.2  PHOS 2.8 3.6 3.9  --  4.7* 3.7   Liver Function Tests: Recent Labs  Lab 04/27/22 0259 04/28/22 0635 04/29/22 0434 05/01/22 0635 05/02/22 0550  AST 12* 13* 13* 14* 25  ALT '9 10 9 11  21  '$ ALKPHOS 69 70 66 72 79  BILITOT 0.7 0.4 0.5 0.4 0.5  PROT 7.1 7.0 7.1 7.3 7.0  ALBUMIN 3.4* 3.5 3.3* 3.4* 3.4*   No results for input(s): "LIPASE", "AMYLASE" in the last 168 hours. No results for input(s): "AMMONIA" in the last 168 hours. CBC: Recent Labs  Lab 04/28/22 0635 04/29/22 0434 04/30/22 0540 05/01/22 0635 05/02/22 0550  WBC 9.0 9.1 8.8 15.5* 9.7  HGB 11.3* 11.0* 11.1* 11.4* 11.8*  HCT 32.3* 31.9* 32.0* 32.4* 34.4*  MCV 110.2* 109.6* 110.7* 108.7* 109.6*  PLT 337 275 254 279 294   Cardiac Enzymes: No results for input(s): "CKTOTAL", "CKMB", "CKMBINDEX", "TROPONINI" in the last 168 hours. BNP: Invalid input(s): "POCBNP" CBG: Recent Labs  Lab 05/02/22 0535 05/02/22 1132 05/02/22 1823 05/03/22 0018 05/03/22 0618  GLUCAP 159* 147* 119* 154* 141*   D-Dimer No results for input(s): "DDIMER" in the last 72 hours. Hgb A1c No results for input(s): "HGBA1C" in the last 72 hours. Lipid Profile Recent Labs    05/02/22 0550  TRIG 47   Thyroid function studies No results for input(s): "TSH", "T4TOTAL", "T3FREE", "THYROIDAB" in the last 72 hours.  Invalid input(s): "FREET3" Anemia work up No results for input(s): "VITAMINB12", "FOLATE", "FERRITIN", "TIBC", "IRON", "RETICCTPCT" in the last 72 hours. Urinalysis    Component Value Date/Time   COLORURINE YELLOW 04/25/2022 1926   APPEARANCEUR CLEAR 04/25/2022 1926   LABSPEC >1.046 (H) 04/25/2022 1926   PHURINE 5.0 04/25/2022 1926   GLUCOSEU NEGATIVE 04/25/2022 1926   HGBUR NEGATIVE 04/25/2022 1926   BILIRUBINUR NEGATIVE 04/25/2022 1926   KETONESUR 20 (A) 04/25/2022 1926   PROTEINUR 30 (A) 04/25/2022 1926   UROBILINOGEN 1.0 02/27/2021 1607   NITRITE NEGATIVE 04/25/2022 1926   LEUKOCYTESUR NEGATIVE 04/25/2022 1926   Sepsis Labs Recent Labs  Lab 04/29/22 0434 04/30/22 0540 05/01/22 0635 05/02/22 0550  WBC 9.1 8.8 15.5* 9.7   Microbiology No results found for this or any previous visit (from the past  240 hour(s)).  Please note: You were cared for by a hospitalist during your hospital stay. Once you are discharged, your primary care physician will handle any further medical issues. Please note that NO REFILLS for any discharge medications will be authorized once you are discharged, as it is imperative that you return to your primary care physician (or establish a relationship with a primary care physician if you  do not have one) for your post hospital discharge needs so that they can reassess your need for medications and monitor your lab values.    Time coordinating discharge: 40 minutes  SIGNED:   Shelly Coss, MD  [facility] 05/03/2022, 11:29 AM Pager [phone]  If 7PM-7AM, please contact night-coverage [email or web address] Password [email or web address]

## 2022-05-03 NOTE — Progress Notes (Signed)
PHARMACY - TOTAL PARENTERAL NUTRITION CONSULT NOTE   Indication: Small bowel obstruction  Patient Measurements: Height: _0  (170.2 cm) Weight: 86.6 kg (190 lb 14.7 oz) IBW/kg (Calculated) : 61.6 TPN AdjBW (KG): 67.1 Body mass index is 29.9 kg/m.  Assessment: Pharmacy is consulted to dose TPN for this 64 yoF that was admitted on 8/3 with small bowel obstruction - per [facility], very likely a malignant obstruction secondary to progression of her known carcinomatosis, and a surgical intervention is not likely to help her. NG decompression was not successful.  She will be at high risk for refeeding due to very limited / No oral intake for 4-5 days prior to admission.  - getting FOLFOX  8/8-8/9  Glucose / Insulin: no hx DM. - on mSSI q6h (required 7 units/24 hrs) - CBG (goal <150): improved 119-155 ( s/p decadron 10 mg IV x1 on 8/8 with chemo) Electrolytes: All WNL including CorrCa (9.4) Renal: SCr WNL and improved, BUN slightly elevated Hepatic: AST/ALT, Alk Phos, T.bili, TG all WNL -  Alb low at 3.4 Intake / Output; MIVF: D5NS at [facility] GI Imaging:  -8/3 CT Abd: Consistent with SBO GI Surgeries / Procedures:   Central access: Implanted port TPN start date: 8/5  Nutritional Goals: Goal TPN rate is 90 mL/hr (provides 97 g of protein and 2030 kcals per day) - Travasol 10% 45g/L - Dextrose 15% - SMOFlipid 25 g/L  RD Assessment:  (8/4) Estimated Needs Total Energy Estimated Needs: 1900-2100 Total Protein Estimated Needs: 90-100g Total Fluid Estimated Needs: 2L/day  Current Nutrition:  NPO TPN  Plan:  Discussed with MD - patient will be discharging today and home health has been set up to deliver TPN to patient's home today. No inpatient TPN orders to be placed today as patient is discharging.   Lenis Noon, PharmD 05/03/22 8:33 AM

## 2022-05-03 NOTE — TOC Transition Note (Signed)
Transition of Care [facility]) - CM/SW Discharge Note   Patient Details  Name: Brenda Stewart MRN: [id number] Date of Birth: 12-Dec-1950  [facility] CM/SW Contact:  Servando Snare, LCSW Phone Number: 05/03/2022, 11:51 AM   Clinical Narrative:   Patient to discharge with home health. [facility] providing TPN. Home health to start today. Patient to transport by daughter.     Final next level of care: [place] Barriers to Discharge: Continued Medical Work up   Patient Goals and CMS Choice Patient states their goals for this hospitalization and ongoing recovery are:: Home CMS Medicare.gov Compare Post Acute Care list provided to:: Patient Choice offered to / list presented to : Patient  Discharge Placement                Patient to be transferred to facility by: daughter Name of family member notified: Patient to call daughter    Discharge Plan and Services   Discharge Planning Services: CM Consult Post Acute Care Choice: Home Health          DME Arranged: Terbutaline pump DME Agency: Other - Comment Roel Cluck) Date DME Agency Contacted: 04/28/22   Representative spoke with at DME Agency: [place] Arranged: RN [place] Agency: [place] ([place]) Date [place]: 04/28/22   Representative spoke with at [place]: Corene Cornea; Confirmed with Caryl Pina 8/7  Social Determinants of Health ([place]) Interventions     Readmission Risk Interventions    04/27/2021   10:52 AM  Readmission Risk Prevention Plan  Transportation Screening Complete  PCP or Specialist Appt within 5-7 Days Complete  Home Care Screening Complete  Medication Review (RN CM) Complete

## 2022-05-04 ENCOUNTER — Other Ambulatory Visit: Payer: Self-pay

## 2022-05-06 ENCOUNTER — Other Ambulatory Visit (HOSPITAL_COMMUNITY): Payer: Self-pay

## 2022-05-07 ENCOUNTER — Other Ambulatory Visit: Payer: TRICARE For Life (TFL)

## 2022-05-07 ENCOUNTER — Ambulatory Visit: Payer: TRICARE For Life (TFL) | Admitting: Nurse Practitioner

## 2022-05-10 ENCOUNTER — Inpatient Hospital Stay (HOSPITAL_BASED_OUTPATIENT_CLINIC_OR_DEPARTMENT_OTHER): Payer: Medicare Other | Admitting: Hematology

## 2022-05-10 ENCOUNTER — Other Ambulatory Visit: Payer: Self-pay

## 2022-05-10 ENCOUNTER — Inpatient Hospital Stay: Payer: Medicare Other

## 2022-05-10 ENCOUNTER — Encounter: Payer: Self-pay | Admitting: Hematology

## 2022-05-10 VITALS — BP 133/90 | HR 106 | Temp 97.8°F | Resp 18 | Wt 190.1 lb

## 2022-05-10 DIAGNOSIS — Z79899 Other long term (current) drug therapy: Secondary | ICD-10-CM | POA: Diagnosis not present

## 2022-05-10 DIAGNOSIS — I700 Atherosclerosis of aorta: Secondary | ICD-10-CM | POA: Diagnosis not present

## 2022-05-10 DIAGNOSIS — K56609 Unspecified intestinal obstruction, unspecified as to partial versus complete obstruction: Secondary | ICD-10-CM | POA: Diagnosis not present

## 2022-05-10 DIAGNOSIS — C269 Malignant neoplasm of ill-defined sites within the digestive system: Secondary | ICD-10-CM | POA: Diagnosis present

## 2022-05-10 DIAGNOSIS — G620 Drug-induced polyneuropathy: Secondary | ICD-10-CM | POA: Diagnosis not present

## 2022-05-10 DIAGNOSIS — D490 Neoplasm of unspecified behavior of digestive system: Secondary | ICD-10-CM | POA: Diagnosis not present

## 2022-05-10 DIAGNOSIS — R1110 Vomiting, unspecified: Secondary | ICD-10-CM | POA: Diagnosis not present

## 2022-05-10 DIAGNOSIS — Z95828 Presence of other vascular implants and grafts: Secondary | ICD-10-CM

## 2022-05-10 DIAGNOSIS — T451X5A Adverse effect of antineoplastic and immunosuppressive drugs, initial encounter: Secondary | ICD-10-CM | POA: Diagnosis not present

## 2022-05-10 DIAGNOSIS — C786 Secondary malignant neoplasm of retroperitoneum and peritoneum: Secondary | ICD-10-CM

## 2022-05-10 DIAGNOSIS — R112 Nausea with vomiting, unspecified: Secondary | ICD-10-CM | POA: Diagnosis not present

## 2022-05-10 DIAGNOSIS — Z5189 Encounter for other specified aftercare: Secondary | ICD-10-CM | POA: Diagnosis not present

## 2022-05-10 DIAGNOSIS — Z5111 Encounter for antineoplastic chemotherapy: Secondary | ICD-10-CM | POA: Diagnosis not present

## 2022-05-10 DIAGNOSIS — R109 Unspecified abdominal pain: Secondary | ICD-10-CM | POA: Diagnosis not present

## 2022-05-10 DIAGNOSIS — C482 Malignant neoplasm of peritoneum, unspecified: Secondary | ICD-10-CM

## 2022-05-10 LAB — CBC WITH DIFFERENTIAL (CANCER CENTER ONLY)
Abs Immature Granulocytes: 0.09 10*3/uL — ABNORMAL HIGH (ref 0.00–0.07)
Basophils Absolute: 0 10*3/uL (ref 0.0–0.1)
Basophils Relative: 0 %
Eosinophils Absolute: 0.1 10*3/uL (ref 0.0–0.5)
Eosinophils Relative: 1 %
HCT: 31.4 % — ABNORMAL LOW (ref 36.0–46.0)
Hemoglobin: 11.1 g/dL — ABNORMAL LOW (ref 12.0–15.0)
Immature Granulocytes: 1 %
Lymphocytes Relative: 15 %
Lymphs Abs: 1.8 10*3/uL (ref 0.7–4.0)
MCH: 37.6 pg — ABNORMAL HIGH (ref 26.0–34.0)
MCHC: 35.4 g/dL (ref 30.0–36.0)
MCV: 106.4 fL — ABNORMAL HIGH (ref 80.0–100.0)
Monocytes Absolute: 0.8 10*3/uL (ref 0.1–1.0)
Monocytes Relative: 7 %
Neutro Abs: 8.7 10*3/uL — ABNORMAL HIGH (ref 1.7–7.7)
Neutrophils Relative %: 76 %
Platelet Count: 253 10*3/uL (ref 150–400)
RBC: 2.95 MIL/uL — ABNORMAL LOW (ref 3.87–5.11)
RDW: 14.3 % (ref 11.5–15.5)
WBC Count: 11.5 10*3/uL — ABNORMAL HIGH (ref 4.0–10.5)
nRBC: 0 % (ref 0.0–0.2)

## 2022-05-10 LAB — CMP (CANCER CENTER ONLY)
ALT: 18 U/L (ref 0–44)
AST: 18 U/L (ref 15–41)
Albumin: 3.6 g/dL (ref 3.5–5.0)
Alkaline Phosphatase: 122 U/L (ref 38–126)
Anion gap: 5 (ref 5–15)
BUN: 34 mg/dL — ABNORMAL HIGH (ref 8–23)
CO2: 31 mmol/L (ref 22–32)
Calcium: 9.4 mg/dL (ref 8.9–10.3)
Chloride: 103 mmol/L (ref 98–111)
Creatinine: 0.96 mg/dL (ref 0.44–1.00)
GFR, Estimated: >60 mL/min (ref >60)
Glucose, Bld: 161 mg/dL — ABNORMAL HIGH (ref 70–99)
Potassium: 4.1 mmol/L (ref 3.5–5.1)
Sodium: 139 mmol/L (ref 135–145)
Total Bilirubin: 0.5 mg/dL (ref 0.3–1.2)
Total Protein: 7.3 g/dL (ref 6.5–8.1)

## 2022-05-10 MED ORDER — ACETAMINOPHEN-CODEINE 300-30 MG PO TABS: 1.0000 | ORAL_TABLET | Freq: Four times a day (QID) | ORAL | 0 refills | Status: AC | PRN

## 2022-05-10 MED ORDER — PROMETHAZINE HCL 25 MG RE SUPP: 25.0000 mg | Freq: Four times a day (QID) | RECTAL | 0 refills | Status: AC | PRN

## 2022-05-10 MED ORDER — SODIUM CHLORIDE 0.9% FLUSH
10.0000 mL | Freq: Once | INTRAVENOUS | Status: AC
  Administered 2022-05-10: 10 mL

## 2022-05-10 MED ORDER — HEPARIN SOD (PORK) LOCK FLUSH 100 UNIT/ML IV SOLN
500.0000 [IU] | Freq: Once | INTRAVENOUS | Status: AC
  Administered 2022-05-10: 500 [IU]

## 2022-05-10 MED ORDER — AMLODIPINE BESYLATE 5 MG PO TABS: 5.0000 mg | ORAL_TABLET | Freq: Every day | ORAL | 1 refills | Status: AC

## 2022-05-10 NOTE — Progress Notes (Signed)
[facility]   [phone] [phone]   Clinic Follow up Note   Patient Care Team: Pcp, No as PCP - General [name], MD as Consulting Physician (Hematology)  Date of Service:  05/10/2022  CHIEF COMPLAINT: f/u of peritoneal carcinomatosis  CURRENT THERAPY:  FOLFOX, q14d, restarted 04/30/22  ASSESSMENT & PLAN:  Brenda Stewart is a 71 y.o. female with   1. Stomach pain, Vomiting, SBO  -she reports new stomach pain and vomiting that developed around 04/23/22. -abdomen x-ray 04/25/22 concerning for small bowel obstruction. She was subsequently admitted. -CT AP during admission showed: progressive free fluid with peritoneal wall thickening; small bowel appearance consistent with obstruction. -NG tube placement attempted but not successful. She was started on TPN, which she continues (nothing PO currently). -today, she reports continued nausea with vomiting and stomach pain. She endorses taking antinausea medicine but subsequently vomits it up.  -she tells me she was not given any pain medication at discharge. I will call in tramadol for her. -she also reports she is still having constipation. Given her vomiting, I will call in Phenergan suppository.   2. 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 CT AP 04/21/21 showed findings suspicious for diffuse peritoneal carcinomatosis. She was admitted and underwent paracentesis on 04/24/21 showing atypical cells. -Diagnostic laparoscopy on 04/30/21 with pathology from peritoneum biopsy showing metastatic carcinoma with signet ring cell features. IHC supports upper GI primary.  -Her EGD was negative for primary malignancy. -her cancer is PD-L1 negative and MMR normal  -she completed 12 cycles of FOLFOX 05/04/21 - 10/10/21, tolerated very well overall  but stopped due to neuropathy.  -She started maintenance Xeloda on 10/29/21, she has tolerated very well with only some hyperpigmentation, no other noticeable side effects. -CT CAP on 04/25/22 in hospitalization for SBO showed disease progression. -she was switched back to FOLFOX on 04/30/22 while hospitalized. She tolerated well but if still experiencing issues related to SBO. -I discussed adding irinotecan to her FOLFOX. I explained to her that the more intensive chemo will likely shrink her cancer faster and improve her SBO. She is agreeable, and we will plan to start this with next cycle.   3. 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 She still reports residual tingling in her fingertips and toes, her handwriting has changed -she is taking vit B12, as she does not want to take gabapentin again   4. H/o PE 04/2021 -incidental finding on staging chest CT on 05/04/21 -she was given heparin in the hospital and transitioned to Eliquis.     PLAN: -I called in tramadol and phenergan suppository, and refilled her amlodipine -lab, flush, f/u, and chemo on 8/22 as scheduled, plan to change to FOLFOXIRI, no chemo PA required      No problem-specific Assessment & Plan notes found for this encounter.   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   Suspected distal small bowel obstruction, with probable transition point in right lower quadrant.   Aortic Atherosclerosis (ICD10-I70.0).   04/22/2021 Imaging   US Abdomen  IMPRESSION: Small volume of lower abdominopelvic ascites.   04/24/2021 Pathology Results   Cytology  Specimen Submitted:  A. ASCITES, PARACENTESIS:   FINAL MICROSCOPIC DIAGNOSIS:  - Atypical cells present  DIAGNOSTIC COMMENTS:  Immunohistochemical  stains show that the rare, mildly atypical cells are positive for calretinin and D2-40; while they are negative for PAX8, ER and MOC-31.  This immunoprofile is consistent with reactive mesothelial cells.    04/25/2021 Initial Diagnosis   Primary peritoneal carcinomatosis ([facility])   04/29/2021 Imaging   CT AP  IMPRESSION: 1. Moderate ascites, mild peritoneal thickening and diffuse omental thickening/caking and smaller focal fluid/cystic areas within the RIGHT abdomen and LOWER pelvis, compatible with peritoneal carcinomatosis. Mild mesenteric stranding is nonspecific. 2. Small subserosal implants along the INFERIOR RIGHT liver. 3. Small LEFT pleural effusion and trace RIGHT pleural effusion with LEFT LOWER lung atelectasis. 4. Aortic Atherosclerosis (ICD10-I70.0).   04/30/2021 Pathology Results   FINAL MICROSCOPIC DIAGNOSIS:   A. 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   ADDENDUM:  By immunohistochemistry, HER-2 is EQUIVOCAL (2+).    FLOURESCENCE IN-SITU HYBRIDIZATION RESULTS:  GROUP 5:   HER2 **NEGATIVE**    05/04/2021 - 04/30/2022 Chemotherapy   Patient is on Treatment Plan : GASTRIC FOLFOX q14d x 12 cycles     05/04/2021 Imaging   CT Chest  IMPRESSION: 1. Suspected segmental filling defects in the anterior segment right upper lobe and apical segment right upper lobe compatible with small pulmonary emboli. Clot burden appears small, although please note that today's examination was not a dedicated CT  angiogram and as such might overestimate or underestimate pulmonary embolus. Positive for acute PE with CT evidence of right heart strain (RV/LV Ratio = 1.1) consistent with at least submassive (intermediate risk) PE. The presence of right heart strain has been associated with an increased risk of morbidity and mortality. Please refer to the "PE Focused" order set in EPIC. 2. No compelling findings of metastatic disease to the chest. There is a small left pleural effusion, but no definite enhancement along the pleura to suggest malignant or exudative etiology at this time. 3. There is some new gas in the soft tissues of the left thoracoabdominal wall, compatible subcutaneous emphysema, probably related to recent laparoscopy or other procedure. 4. Hypodense thyroid nodules up to 2.1 cm in diameter. Recommend thyroid US (ref: J Am Coll Radiol. 2015 Feb;12(2): 143-50). 5. Left upper quadrant ascites, including fluid in the lesser sac. This is reduced compared to the prior CT abdomen of 04/29/2021. 6. Stable appearance of periportal edema and hypodensities along the posterior margin of the right hepatic lobe.   05/07/2021 Pathology Results   FINAL MICROSCOPIC DIAGNOSIS:   A. DUODENUM, BULB, BIOPSY:  - Polypoid duodenal mucosa with chronic inflammation, foveolar  metaplasia and reactive changes  - Negative for dysplasia or malignancy   Cytology: Specimen Submitted:  A. ESOPHAGUS, BRUSHING:   FINAL MICROSCOPIC DIAGNOSIS:  - Benign reactive/reparative changes    05/16/2021 Cancer Staging   Staging form: Soft Tissue Sarcoma of the Abdomen and Thoracic Visceral Organs, AJCC 8th Edition - Clinical: cTX, cN0, pM1 - Signed by Burr Medico,  Krista Blue, MD on 05/16/2021   08/10/2021 Imaging   CT CAP IMPRESSION: 1. Subserosal implants along the inferior right liver are no longer apparent or decreased in size when compared to prior exam. 2. Interval resolution of abdominal ascites. Decreased peritoneal thickening  and stranding. 3. Interval resolution of small left pleural effusion. 4.  Aortic Atherosclerosis (ICD10-I70.0).     05/14/2022 -  Chemotherapy   Patient is on Treatment Plan : COLORECTAL FOLFOXIRI q14d        INTERVAL HISTORY:  AUDRE CENCI is here for a follow up of peritoneal carcinomatosis. She was last seen by me on 05/02/22 while she was in the hospital. She presents to the clinic alone. She reports nausea and vomiting   All other systems were reviewed with the patient and are negative.  MEDICAL HISTORY:  Past Medical History:  Diagnosis Date   Acid reflux    Hypertension     SURGICAL HISTORY: Past Surgical History:  Procedure Laterality Date   BIOPSY  05/07/2021   Procedure: BIOPSY;  Surgeon: [name], MD;  Location: [facility];  Service: Endoscopy;;   BUNIONECTOMY Bilateral    ESOPHAGEAL BRUSHING  05/07/2021   Procedure: ESOPHAGEAL BRUSHING;  Surgeon: [name], MD;  Location: [facility];  Service: Endoscopy;;   ESOPHAGOGASTRODUODENOSCOPY (EGD) WITH PROPOFOL N/A 05/07/2021   Procedure: ESOPHAGOGASTRODUODENOSCOPY (EGD) WITH PROPOFOL;  Surgeon: [name], MD;  Location: [facility];  Service: Endoscopy;  Laterality: N/A;   HAND TENDON SURGERY Bilateral    IR IMAGING GUIDED PORT INSERTION  04/26/2021   IR PARACENTESIS  04/24/2021   LAPAROSCOPY N/A 04/30/2021   Procedure: LAPAROSCOPY DIAGNOSTIC WITH BIOPSIES;  Surgeon: Lafonda Mosses, MD;  Location: [facility];  Service: Gynecology;  Laterality: N/A;   PARTIAL HYSTERECTOMY      I have reviewed the social history and family history with the patient and they are unchanged from previous note.  ALLERGIES:  has No Known Allergies.  MEDICATIONS:  Current Outpatient Medications  Medication Sig Dispense Refill   acetaminophen-codeine (TYLENOL #3) 300-30 MG tablet Take 1 tablet by mouth every 6 (six) hours as needed for moderate pain. 30 tablet 0   amLODipine (NORVASC) 5 MG tablet Take 1 tablet (5 mg total) by mouth  daily. 30 tablet 1   promethazine (PHENERGAN) 25 MG suppository Place 1 suppository (25 mg total) rectally every 6 (six) hours as needed for nausea or vomiting. 12 each 0   aspirin EC 81 MG tablet Take 81 mg by mouth daily. Swallow whole.     Cholecalciferol (VITAMIN D3) 50 MCG (2000 UT) capsule Take 2,000 Units by mouth daily.     Cyanocobalamin (VITAMIN B-12 PO) Take 1 capsule by mouth daily.     ELIQUIS 5 MG TABS tablet TAKE 1 TABLET BY MOUTH TWICE DAILY 60 tablet 11   furosemide (LASIX) 20 MG tablet TAKE 1 TABLET BY MOUTH ONCE DAILY ON MONDAY, WEDNESDAY, AND FRIDAY. TAKE WITH POTASSIUM (Patient taking differently: Take 20 mg by mouth See admin instructions. Take 20 mg by mouth once daily on Monday, Wednesday, and Friday. Take with potassium) 30 tablet 0   potassium chloride SA (KLOR-CON M20) 20 MEQ tablet TAKE 1 TABLET BY MOUTH ONCE DAILY ON MONDAY, WEDNESDAY, AND FRIDAY. TAKE WITH LASIX (Patient taking differently: Take 20 mEq by mouth See admin instructions. Take 20 mEq once daily on Monday, Wednesday, and Friday.) 30 tablet 0   prochlorperazine (COMPAZINE) 10 MG tablet Take 1 tablet (10 mg total) by mouth every 6 (six)  hours as needed for nausea or vomiting. (Patient taking differently: Take 10 mg by mouth daily as needed for nausea or vomiting.) 30 tablet 2   sennosides-docusate sodium (SENOKOT-S) 8.6-50 MG tablet Take 2 tablets by mouth daily as needed for constipation.     vitamin C (ASCORBIC ACID) 250 MG tablet Take 250 mg by mouth daily.     No current facility-administered medications for this visit.    PHYSICAL EXAMINATION: ECOG PERFORMANCE STATUS: 2 - Symptomatic, <50% confined to bed  Vitals:   05/10/22 0950  BP: (!) 133/90  Pulse: (!) 106  Resp: 18  Temp: 97.8 F (36.6 C)  SpO2: 100%   Wt Readings from Last 3 Encounters:  05/10/22 190 lb 2 oz (86.2 kg)  04/30/22 190 lb 14.7 oz (86.6 kg)  04/25/22 184 lb 3 oz (83.5 kg)     GENERAL:alert, no distress and  comfortable SKIN: skin color, texture, turgor are normal, no rashes or significant lesions EYES: normal, Conjunctiva are pink and non-injected, sclera clear ABDOMEN: (+) bloated but soft, (+) tenderness to palpation on left, (+) no bowel sounds Musculoskeletal:no cyanosis of digits and no clubbing  NEURO: alert & oriented x 3 with fluent speech, no focal motor/sensory deficits  LABORATORY DATA:  I have reviewed the data as listed    Latest Ref Rng & Units 05/10/2022    9:24 AM 05/02/2022    5:50 AM 05/01/2022    6:35 AM  CBC  WBC 4.0 - 10.5 K/uL 11.5  9.7  15.5   Hemoglobin 12.0 - 15.0 g/dL 11.1  11.8  11.4   Hematocrit 36.0 - 46.0 % 31.4  34.4  32.4   Platelets 150 - 400 K/uL 253  294  279         Latest Ref Rng & Units 05/10/2022    9:24 AM 05/03/2022    5:06 AM 05/02/2022    5:50 AM  CMP  Glucose 70 - 99 mg/dL 161  155  164   BUN 8 - 23 mg/dL 34  25  23   Creatinine 0.44 - 1.00 mg/dL 0.96  0.93  1.14   Sodium 135 - 145 mmol/L 139  138  134   Potassium 3.5 - 5.1 mmol/L 4.1  4.6  4.5   Chloride 98 - 111 mmol/L 103  104  102   CO2 22 - 32 mmol/L _0 Calcium 8.9 - 10.3 mg/dL 9.4  8.9  8.9   Total Protein 6.5 - 8.1 g/dL 7.3   7.0   Total Bilirubin 0.3 - 1.2 mg/dL 0.5   0.5   Alkaline Phos 38 - 126 U/L 122   79   AST 15 - 41 U/L 18   25   ALT 0 - 44 U/L 18   21       RADIOGRAPHIC STUDIES: I have personally reviewed the radiological images as listed and agreed with the findings in the report. No results found.    Orders Placed This Encounter  Procedures   CBC with Differential ([facility] Only)    Standing Status:   Future    Standing Expiration Date:   05/15/2023   CMP ([facility] only)    Standing Status:   Future    Standing Expiration Date:   05/15/2023   CBC with Differential ([facility] Only)    Standing Status:   Future    Standing Expiration Date:   05/29/2023   CMP ([facility] only)  Standing Status:   Future    Standing Expiration Date:    05/29/2023   All questions were answered. The patient knows to call the clinic with any problems, questions or concerns. No barriers to learning was detected. The total time spent in the appointment was 40 minutes.     [name], MD 05/10/2022   I, Wilburn Mylar, am acting as scribe for [name], MD.   I have reviewed the above documentation for accuracy and completeness, and I agree with the above.

## 2022-05-12 ENCOUNTER — Other Ambulatory Visit: Payer: Self-pay | Admitting: Hematology

## 2022-05-13 MED FILL — Dexamethasone Sodium Phosphate Inj 100 MG/10ML: INTRAMUSCULAR | Qty: 1 | Status: AC

## 2022-05-13 MED FILL — Fosaprepitant Dimeglumine For IV Infusion 150 MG (Base Eq): INTRAVENOUS | Qty: 5 | Status: AC

## 2022-05-14 ENCOUNTER — Encounter: Payer: Self-pay | Admitting: Hematology

## 2022-05-14 ENCOUNTER — Other Ambulatory Visit: Payer: Self-pay

## 2022-05-14 ENCOUNTER — Inpatient Hospital Stay: Payer: Medicare Other

## 2022-05-14 ENCOUNTER — Inpatient Hospital Stay (HOSPITAL_BASED_OUTPATIENT_CLINIC_OR_DEPARTMENT_OTHER): Payer: Medicare Other | Admitting: Hematology

## 2022-05-14 VITALS — BP 125/89 | HR 113 | Temp 98.5°F | Resp 18 | Ht 67.0 in | Wt 191.7 lb

## 2022-05-14 VITALS — BP 133/88 | HR 101 | Temp 98.3°F | Resp 18

## 2022-05-14 DIAGNOSIS — Z5111 Encounter for antineoplastic chemotherapy: Secondary | ICD-10-CM | POA: Diagnosis not present

## 2022-05-14 DIAGNOSIS — Z95828 Presence of other vascular implants and grafts: Secondary | ICD-10-CM

## 2022-05-14 DIAGNOSIS — D490 Neoplasm of unspecified behavior of digestive system: Secondary | ICD-10-CM

## 2022-05-14 DIAGNOSIS — C786 Secondary malignant neoplasm of retroperitoneum and peritoneum: Secondary | ICD-10-CM | POA: Diagnosis not present

## 2022-05-14 LAB — CBC WITH DIFFERENTIAL (CANCER CENTER ONLY)
Abs Immature Granulocytes: 0.09 10*3/uL — ABNORMAL HIGH (ref 0.00–0.07)
Basophils Absolute: 0 10*3/uL (ref 0.0–0.1)
Basophils Relative: 0 %
Eosinophils Absolute: 0.1 10*3/uL (ref 0.0–0.5)
Eosinophils Relative: 1 %
HCT: 29.8 % — ABNORMAL LOW (ref 36.0–46.0)
Hemoglobin: 10.4 g/dL — ABNORMAL LOW (ref 12.0–15.0)
Immature Granulocytes: 1 %
Lymphocytes Relative: 18 %
Lymphs Abs: 1.5 10*3/uL (ref 0.7–4.0)
MCH: 36.9 pg — ABNORMAL HIGH (ref 26.0–34.0)
MCHC: 34.9 g/dL (ref 30.0–36.0)
MCV: 105.7 fL — ABNORMAL HIGH (ref 80.0–100.0)
Monocytes Absolute: 0.6 10*3/uL (ref 0.1–1.0)
Monocytes Relative: 7 %
Neutro Abs: 6.1 10*3/uL (ref 1.7–7.7)
Neutrophils Relative %: 73 %
Platelet Count: 289 10*3/uL (ref 150–400)
RBC: 2.82 MIL/uL — ABNORMAL LOW (ref 3.87–5.11)
RDW: 14.3 % (ref 11.5–15.5)
WBC Count: 8.4 10*3/uL (ref 4.0–10.5)
nRBC: 0 % (ref 0.0–0.2)

## 2022-05-14 LAB — CMP (CANCER CENTER ONLY)
ALT: 10 U/L (ref 0–44)
AST: 14 U/L — ABNORMAL LOW (ref 15–41)
Albumin: 3.4 g/dL — ABNORMAL LOW (ref 3.5–5.0)
Alkaline Phosphatase: 106 U/L (ref 38–126)
Anion gap: 6 (ref 5–15)
BUN: 29 mg/dL — ABNORMAL HIGH (ref 8–23)
CO2: 31 mmol/L (ref 22–32)
Calcium: 9.1 mg/dL (ref 8.9–10.3)
Chloride: 103 mmol/L (ref 98–111)
Creatinine: 0.98 mg/dL (ref 0.44–1.00)
GFR, Estimated: >60 mL/min (ref >60)
Glucose, Bld: 153 mg/dL — ABNORMAL HIGH (ref 70–99)
Potassium: 4 mmol/L (ref 3.5–5.1)
Sodium: 140 mmol/L (ref 135–145)
Total Bilirubin: 0.5 mg/dL (ref 0.3–1.2)
Total Protein: 7.2 g/dL (ref 6.5–8.1)

## 2022-05-14 MED ORDER — OXALIPLATIN CHEMO INJECTION 100 MG/20ML
50.0000 mg/m2 | Freq: Once | INTRAVENOUS | Status: AC
  Administered 2022-05-14: 100 mg via INTRAVENOUS
  Filled 2022-05-14: qty 20

## 2022-05-14 MED ORDER — SODIUM CHLORIDE 0.9% FLUSH: 10.0000 mL | INTRAVENOUS | Status: DC | PRN

## 2022-05-14 MED ORDER — SODIUM CHLORIDE 0.9% FLUSH
10.0000 mL | Freq: Once | INTRAVENOUS | Status: AC
  Administered 2022-05-14: 10 mL

## 2022-05-14 MED ORDER — ONDANSETRON 4 MG PO TBDP: 4.0000 mg | ORAL_TABLET | Freq: Every day | ORAL | 0 refills | Status: AC | PRN

## 2022-05-14 MED ORDER — SODIUM CHLORIDE 0.9 % IV SOLN
150.0000 mg | Freq: Once | INTRAVENOUS | Status: AC
  Administered 2022-05-14: 150 mg via INTRAVENOUS
  Filled 2022-05-14: qty 150

## 2022-05-14 MED ORDER — ATROPINE SULFATE 1 MG/ML IV SOLN
0.5000 mg | Freq: Once | INTRAVENOUS | Status: AC | PRN
  Administered 2022-05-14: 0.5 mg via INTRAVENOUS
  Filled 2022-05-14: qty 1

## 2022-05-14 MED ORDER — SODIUM CHLORIDE 0.9 % IV SOLN
2200.0000 mg/m2 | INTRAVENOUS | Status: DC
  Administered 2022-05-14: 4450 mg via INTRAVENOUS
  Filled 2022-05-14: qty 89

## 2022-05-14 MED ORDER — HEPARIN SOD (PORK) LOCK FLUSH 100 UNIT/ML IV SOLN: 500.0000 [IU] | Freq: Once | INTRAVENOUS | Status: DC | PRN

## 2022-05-14 MED ORDER — DEXTROSE 5 % IV SOLN: Freq: Once | INTRAVENOUS | Status: AC

## 2022-05-14 MED ORDER — LEUCOVORIN CALCIUM INJECTION 350 MG
400.0000 mg/m2 | Freq: Once | INTRAVENOUS | Status: AC
  Administered 2022-05-14: 808 mg via INTRAVENOUS
  Filled 2022-05-14: qty 25

## 2022-05-14 MED ORDER — SODIUM CHLORIDE 0.9 % IV SOLN
10.0000 mg | Freq: Once | INTRAVENOUS | Status: AC
  Administered 2022-05-14: 10 mg via INTRAVENOUS
  Filled 2022-05-14: qty 10

## 2022-05-14 MED ORDER — SODIUM CHLORIDE 0.9 % IV SOLN
110.0000 mg/m2 | Freq: Once | INTRAVENOUS | Status: AC
  Administered 2022-05-14: 220 mg via INTRAVENOUS
  Filled 2022-05-14: qty 11

## 2022-05-14 MED ORDER — PALONOSETRON HCL INJECTION 0.25 MG/5ML
0.2500 mg | Freq: Once | INTRAVENOUS | Status: AC
  Administered 2022-05-14: 0.25 mg via INTRAVENOUS
  Filled 2022-05-14: qty 5

## 2022-05-14 NOTE — Patient Instructions (Signed)
[facility]  Discharge Instructions: Thank you for choosing [facility] to provide your oncology and hematology care.   If you have a lab appointment with the [facility], please go directly to the [facility] and check in at the registration area.   Wear comfortable clothing and clothing appropriate for easy access to any Portacath or PICC line.   We strive to give you quality time with your provider. You may need to reschedule your appointment if you arrive late (15 or more minutes).  Arriving late affects you and other patients whose appointments are after yours.  Also, if you miss three or more appointments without notifying the office, you may be dismissed from the clinic at the provider's discretion.      For prescription refill requests, have your pharmacy contact our office and allow 72 hours for refills to be completed.    Today you received the following chemotherapy and/or immunotherapy agents: Irinotecan, Oxaliplatin. Leucovorin, Florouracil      To help prevent nausea and vomiting after your treatment, we encourage you to take your nausea medication as directed.  BELOW ARE SYMPTOMS THAT SHOULD BE REPORTED IMMEDIATELY: *FEVER GREATER THAN 100.4 F (38 C) OR HIGHER *CHILLS OR SWEATING *NAUSEA AND VOMITING THAT IS NOT CONTROLLED WITH YOUR NAUSEA MEDICATION *UNUSUAL SHORTNESS OF BREATH *UNUSUAL BRUISING OR BLEEDING *URINARY PROBLEMS (pain or burning when urinating, or frequent urination) *BOWEL PROBLEMS (unusual diarrhea, constipation, pain near the anus) TENDERNESS IN MOUTH AND THROAT WITH OR WITHOUT PRESENCE OF ULCERS (sore throat, sores in mouth, or a toothache) UNUSUAL RASH, SWELLING OR PAIN  UNUSUAL VAGINAL DISCHARGE OR ITCHING   Items with * indicate a potential emergency and should be followed up as soon as possible or go to the Emergency Department if any problems should occur.  Please show the CHEMOTHERAPY ALERT CARD or  IMMUNOTHERAPY ALERT CARD at check-in to the Emergency Department and triage nurse.  Should you have questions after your visit or need to cancel or reschedule your appointment, please contact [facility]  Dept: [phone]  and follow the prompts.  Office hours are 8:00 a.m. to 4:30 p.m. Monday - Friday. Please note that voicemails left after 4:00 p.m. may not be returned until the following business day.  We are closed weekends and major holidays. You have access to a nurse at all times for urgent questions. Please call the main number to the clinic Dept: [phone] and follow the prompts.   For any non-urgent questions, you may also contact your provider using MyChart. We now offer e-Visits for anyone 19 and older to request care online for non-urgent symptoms. For details visit [email or web address].   Also download the MyChart app! Go to the app store, search "MyChart", open the app, select [facility], and log in with your MyChart username and password.  Masks are optional in the cancer centers. If you would like for your care team to wear a mask while they are taking care of you, please let them know. You may have one support person who is at least 70 years old accompany you for your appointments. Irinotecan Injection What is this medication? IRINOTECAN (ir in oh TEE kan) treats some types of cancer. It works by slowing down the growth of cancer cells. This medicine may be used for other purposes; ask your health care provider or pharmacist if you have questions. COMMON BRAND NAME(S): Camptosar What should I tell my care team before I  take this medication? They need to know if you have any of these conditions: Dehydration Diarrhea Infection, especially a viral infection, such as chickenpox, cold sores, herpes Liver disease Low blood cell levels (white cells, red cells, and platelets) Low levels of electrolytes, such as calcium, magnesium, or potassium in  your blood Recent or ongoing radiation An unusual or allergic reaction to irinotecan, other medications, foods, dyes, or preservatives If you or your partner are pregnant or trying to get pregnant Breast-feeding How should I use this medication? This medication is injected into a vein. It is given by your care team in a hospital or clinic setting. Talk to your care team about the use of this medication in children. Special care may be needed. Overdosage: If you think you have taken too much of this medicine contact a poison control center or emergency room at once. NOTE: This medicine is only for you. Do not share this medicine with others. What if I miss a dose? Keep appointments for follow-up doses. It is important not to miss your dose. Call your care team if you are unable to keep an appointment. What may interact with this medication? Do not take this medication with any of the following: Cobicistat Itraconazole This medication may also interact with the following: Certain antibiotics, such as clarithromycin, rifampin, rifabutin Certain antivirals for HIV or AIDS Certain medications for fungal infections, such as ketoconazole, posaconazole, voriconazole Certain medications for seizures, such as carbamazepine, phenobarbital, phenytoin Gemfibrozil Nefazodone St. John's wort This list may not describe all possible interactions. Give your health care provider a list of all the medicines, herbs, non-prescription drugs, or dietary supplements you use. Also tell them if you smoke, drink alcohol, or use illegal drugs. Some items may interact with your medicine. What should I watch for while using this medication? Your condition will be monitored carefully while you are receiving this medication. You may need blood work while taking this medication. This medication may make you feel generally unwell. This is not uncommon as chemotherapy can affect healthy cells as well as cancer cells. Report  any side effects. Continue your course of treatment even though you feel ill unless your care team tells you to stop. This medication can cause serious side effects. To reduce the risk, your care team may give you other medications to take before receiving this one. Be sure to follow the directions from your care team. This medication may affect your coordination, reaction time, or judgement. Do not drive or operate machinery until you know how this medication affects you. Sit up or stand slowly to reduce the risk of dizzy or fainting spells. Drinking alcohol with this medication can increase the risk of these side effects. This medication may increase your risk of getting an infection. Call your care team for advice if you get a fever, chills, sore throat, or other symptoms of a cold or flu. Do not treat yourself. Try to avoid being around people who are sick. Avoid taking medications that contain aspirin, acetaminophen, ibuprofen, naproxen, or ketoprofen unless instructed by your care team. These medications may hide a fever. This medication may increase your risk to bruise or bleed. Call your care team if you notice any unusual bleeding. Be careful brushing or flossing your teeth or using a toothpick because you may get an infection or bleed more easily. If you have any dental work done, tell your dentist you are receiving this medication. Talk to your care team if you or your partner are  pregnant or think either of you might be pregnant. This medication can cause serious birth defects if taken during pregnancy and for 6 months after the last dose. You will need a negative pregnancy test before starting this medication. Contraception is recommended while taking this medication and for 6 months after the last dose. Your care team can help you find the option that works for you. Do not father a child while taking this medication and for 3 months after the last dose. Use a condom for contraception during  this time period. Do not breastfeed while taking this medication and for 7 days after the last dose. This medication may cause infertility. Talk to your care team if you are concerned about your fertility. What side effects may I notice from receiving this medication? Side effects that you should report to your care team as soon as possible: Allergic reactions--skin rash, itching, hives, swelling of the face, lips, tongue, or throat Dry cough, shortness of breath or trouble breathing Increased saliva or tears, increased sweating, stomach cramping, diarrhea, small pupils, unusual weakness or fatigue, slow heartbeat Infection--fever, chills, cough, sore throat, wounds that don't heal, pain or trouble when passing urine, general feeling of discomfort or being unwell Kidney injury--decrease in the amount of urine, swelling of the ankles, hands, or feet Low red blood cell level--unusual weakness or fatigue, dizziness, headache, trouble breathing Severe or prolonged diarrhea Unusual bruising or bleeding Side effects that usually do not require medical attention (report to your care team if they continue or are bothersome): Constipation Diarrhea Hair loss Loss of appetite Nausea Stomach pain This list may not describe all possible side effects. Call your doctor for medical advice about side effects. You may report side effects to FDA at 1-800-FDA-1088. Where should I keep my medication? This medication is given in a hospital or clinic. It will not be stored at home. NOTE: This sheet is a summary. It may not cover all possible information. If you have questions about this medicine, talk to your doctor, pharmacist, or health care provider.  2023 Elsevier/Gold Standard (2022-01-17 00:00:00)

## 2022-05-14 NOTE — Progress Notes (Signed)
Ok to treat today per Dr. Burr Medico with HR 102-112.

## 2022-05-14 NOTE — Progress Notes (Signed)
[place]   [phone] [phone]   Clinic Follow up Note   Patient Care Team: Pcp, No as PCP - General [name], MD as Consulting Physician (Hematology)  Date of Service:  05/14/2022  CHIEF COMPLAINT: f/u of peritoneal carcinomatosis  CURRENT THERAPY:  FOLFOX, q14d, restarted 04/30/22 -Irinotecan added 05/14/22  ASSESSMENT & PLAN:  Brenda Stewart is a 71 y.o. female with   1. Stomach pain, Vomiting, SBO  -she reports new stomach pain and vomiting that developed around 04/23/22. -abdomen x-ray 04/25/22 concerning for small bowel obstruction. She was subsequently admitted. -CT AP during admission showed: progressive free fluid with peritoneal wall thickening; small bowel appearance consistent with obstruction. -NG tube placement attempted but not successful. She was started on TPN, which she continues (nothing PO currently). -her stomach pain has lessened but her vomiting has increased to 3x a day. She feels the suppository may be helping as she passed gas yesterday. -I will call in zofran OTD for her to try orally.   2. 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 CT AP 04/21/21 showed findings suspicious for diffuse peritoneal carcinomatosis. She was admitted and underwent paracentesis on 04/24/21 showing atypical cells. -Diagnostic laparoscopy on 04/30/21 with pathology from peritoneum biopsy showing metastatic carcinoma with signet ring cell features. IHC supports upper GI primary.  -Her EGD was negative for primary malignancy. -her cancer is PD-L1 negative and MMR normal  -she completed 12 cycles of FOLFOX 05/04/21 - 10/10/21, tolerated very well overall but stopped due to neuropathy.  -She started maintenance Xeloda on 10/29/21, she has tolerated very well with only some  hyperpigmentation, no other noticeable side effects. -CT CAP on 04/25/22 in hospitalization for SBO showed disease progression. -she was switched back to FOLFOX on 04/30/22 while hospitalized. She tolerated well but if still experiencing issues related to SBO (see #1). Due to the limited response to first cycle, I will change her chemo to [facility] with dose reduction -labs reviewed, mild anemia with hgb 10.4. Otherwise adequate to proceed with treatment. We are adding irinotecan today.   3. 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 She still reports residual tingling in her fingertips and toes, her handwriting has changed -she is taking vit B12, as she does not want to take gabapentin again   4. H/o PE 04/2021 -incidental finding on staging chest CT on 05/04/21 -she was given heparin in the hospital and transitioned to Eliquis. -she is also taking lasix, I advised her to take twice a week now.     PLAN: -proceed with first cycle FOLFOXIRI today with dose reduction  -I called in zofran ODT (low dose due to risk of OT prolongation) -lab, flush, f/u, and FOLFOXIRI on 9/5 and 9/20 -continue TPN and PICC line care    No problem-specific Assessment & Plan notes found for this encounter.   SUMMARY OF ONCOLOGIC HISTORY: Oncology History  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[place])  04/21/2021 Imaging   CT AP w/o contrast  IMPRESSION: Findings highly suspicious for diffuse peritoneal carcinomatosis.   Suspected distal small bowel obstruction, with probable transition point in right lower quadrant.   Aortic Atherosclerosis (ICD10-I70.0).   04/22/2021 Imaging   US Abdomen  IMPRESSION: Small volume of lower abdominopelvic ascites.   04/24/2021 Pathology Results   Cytology  Specimen Submitted:  A. ASCITES, PARACENTESIS:   FINAL MICROSCOPIC DIAGNOSIS:  - Atypical cells present  DIAGNOSTIC COMMENTS:  Immunohistochemical stains  show that the rare, mildly atypical cells are positive for calretinin and D2-40; while they are negative for PAX8, ER and MOC-31.  This immunoprofile is consistent with reactive mesothelial cells.    04/25/2021 Initial Diagnosis   Primary peritoneal carcinomatosis ([place])   04/29/2021 Imaging   CT AP  IMPRESSION: 1. Moderate ascites, mild peritoneal thickening and diffuse omental thickening/caking and smaller focal fluid/cystic areas within the RIGHT abdomen and LOWER pelvis, compatible with peritoneal carcinomatosis. Mild mesenteric stranding is nonspecific. 2. Small subserosal implants along the INFERIOR RIGHT liver. 3. Small LEFT pleural effusion and trace RIGHT pleural effusion with LEFT LOWER lung atelectasis. 4. Aortic Atherosclerosis (ICD10-I70.0).   04/30/2021 Pathology Results   FINAL MICROSCOPIC DIAGNOSIS:   A. 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   ADDENDUM:  By immunohistochemistry, HER-2 is EQUIVOCAL (2+).    FLOURESCENCE IN-SITU HYBRIDIZATION RESULTS:  GROUP 5:   HER2 **NEGATIVE**    05/04/2021 - 04/30/2022 Chemotherapy   Patient is on Treatment Plan : GASTRIC FOLFOX q14d x 12 cycles     05/04/2021 Imaging   CT Chest  IMPRESSION: 1. Suspected segmental filling defects in the anterior segment right upper lobe and apical segment right upper lobe compatible with small pulmonary emboli. Clot burden appears small, although please note that today's examination was not a dedicated CT angiogram  and as such might overestimate or underestimate pulmonary embolus. Positive for acute PE with CT evidence of right heart strain (RV/LV Ratio = 1.1) consistent with at least submassive (intermediate risk) PE. The presence of right heart strain has been associated with an increased risk of morbidity and mortality. Please refer to the "PE Focused" order set in EPIC. 2. No compelling findings of metastatic disease to the chest. There is a small left pleural effusion, but no definite enhancement along the pleura to suggest malignant or exudative etiology at this time. 3. There is some new gas in the soft tissues of the left thoracoabdominal wall, compatible subcutaneous emphysema, probably related to recent laparoscopy or other procedure. 4. Hypodense thyroid nodules up to 2.1 cm in diameter. Recommend thyroid US (ref: J Am Coll Radiol. 2015 Feb;12(2): 143-50). 5. Left upper quadrant ascites, including fluid in the lesser sac. This is reduced compared to the prior CT abdomen of 04/29/2021. 6. Stable appearance of periportal edema and hypodensities along the posterior margin of the right hepatic lobe.   05/07/2021 Pathology Results   FINAL MICROSCOPIC DIAGNOSIS:   A. DUODENUM, BULB, BIOPSY:  - Polypoid duodenal mucosa with chronic inflammation, foveolar  metaplasia and reactive changes  - Negative for dysplasia or malignancy   Cytology: Specimen Submitted:  A. ESOPHAGUS, BRUSHING:   FINAL MICROSCOPIC DIAGNOSIS:  - Benign reactive/reparative changes    05/16/2021 Cancer Staging   Staging form: Soft Tissue Sarcoma of the Abdomen and Thoracic Visceral Organs, AJCC 8th Edition - Clinical: cTX, cN0, pM1 -  Signed by [name], MD on 05/16/2021   08/10/2021 Imaging   CT CAP IMPRESSION: 1. Subserosal implants along the inferior right liver are no longer apparent or decreased in size when compared to prior exam. 2. Interval resolution of abdominal ascites. Decreased peritoneal thickening and  stranding. 3. Interval resolution of small left pleural effusion. 4.  Aortic Atherosclerosis (ICD10-I70.0).     05/14/2022 -  Chemotherapy   Patient is on Treatment Plan : COLORECTAL FOLFOXIRI q14d        INTERVAL HISTORY:  Brenda Stewart is here for a follow up of peritoneal carcinomatosis. She was last seen by me on 05/10/22. She presents to the clinic alone. She reports she is vomiting 3 times a day now. She tells me this morning she is nauseated but has not vomited yet. She reports she has not needed as much pain medication lately. She reports she passed gas for the first time yesterday.   All other systems were reviewed with the patient and are negative.  MEDICAL HISTORY:  Past Medical History:  Diagnosis Date   Acid reflux    Hypertension     SURGICAL HISTORY: Past Surgical History:  Procedure Laterality Date   BIOPSY  05/07/2021   Procedure: BIOPSY;  Surgeon: [name], MD;  Location: [facility];  Service: Endoscopy;;   BUNIONECTOMY Bilateral    ESOPHAGEAL BRUSHING  05/07/2021   Procedure: ESOPHAGEAL BRUSHING;  Surgeon: [name], MD;  Location: [facility];  Service: Endoscopy;;   ESOPHAGOGASTRODUODENOSCOPY (EGD) WITH PROPOFOL N/A 05/07/2021   Procedure: ESOPHAGOGASTRODUODENOSCOPY (EGD) WITH PROPOFOL;  Surgeon: [name], MD;  Location: [facility];  Service: Endoscopy;  Laterality: N/A;   HAND TENDON SURGERY Bilateral    IR IMAGING GUIDED PORT INSERTION  04/26/2021   IR PARACENTESIS  04/24/2021   LAPAROSCOPY N/A 04/30/2021   Procedure: LAPAROSCOPY DIAGNOSTIC WITH BIOPSIES;  Surgeon: Lafonda Mosses, MD;  Location: [facility];  Service: Gynecology;  Laterality: N/A;   PARTIAL HYSTERECTOMY      I have reviewed the social history and family history with the patient and they are unchanged from previous note.  ALLERGIES:  has No Known Allergies.  MEDICATIONS:  Current Outpatient Medications  Medication Sig Dispense Refill   ondansetron (ZOFRAN-ODT) 4 MG  disintegrating tablet Take 1 tablet (4 mg total) by mouth daily as needed for nausea or vomiting. 20 tablet 0   acetaminophen-codeine (TYLENOL #3) 300-30 MG tablet Take 1 tablet by mouth every 6 (six) hours as needed for moderate pain. 30 tablet 0   amLODipine (NORVASC) 5 MG tablet Take 1 tablet (5 mg total) by mouth daily. 30 tablet 1   aspirin EC 81 MG tablet Take 81 mg by mouth daily. Swallow whole.     Cholecalciferol (VITAMIN D3) 50 MCG (2000 UT) capsule Take 2,000 Units by mouth daily.     Cyanocobalamin (VITAMIN B-12 PO) Take 1 capsule by mouth daily.     ELIQUIS 5 MG TABS tablet TAKE 1 TABLET BY MOUTH TWICE DAILY 60 tablet 11   furosemide (LASIX) 20 MG tablet TAKE 1 TABLET BY MOUTH ONCE DAILY ON MONDAY, WEDNESDAY, AND FRIDAY. TAKE WITH POTASSIUM 30 tablet 0   potassium chloride SA (KLOR-CON M20) 20 MEQ tablet TAKE 1 TABLET BY MOUTH ONCE DAILY ON MONDAY, WEDNESDAY, AND FRIDAY. TAKE WITH LASIX (Patient taking differently: Take 20 mEq by mouth See admin instructions. Take 20 mEq once daily on Monday, Wednesday, and Friday.) 30 tablet 0   prochlorperazine (COMPAZINE) 10 MG tablet Take 1  tablet (10 mg total) by mouth every 6 (six) hours as needed for nausea or vomiting. (Patient taking differently: Take 10 mg by mouth daily as needed for nausea or vomiting.) 30 tablet 2   promethazine (PHENERGAN) 25 MG suppository Place 1 suppository (25 mg total) rectally every 6 (six) hours as needed for nausea or vomiting. 12 each 0   sennosides-docusate sodium (SENOKOT-S) 8.6-50 MG tablet Take 2 tablets by mouth daily as needed for constipation.     vitamin C (ASCORBIC ACID) 250 MG tablet Take 250 mg by mouth daily.     No current facility-administered medications for this visit.   Facility-Administered Medications Ordered in Other Visits  Medication Dose Route Frequency Provider Last Rate Last Admin   atropine injection 0.5 mg  0.5 mg Intravenous Once PRN [name], MD       dexamethasone (DECADRON) 10 mg  in sodium chloride 0.9 % 50 mL IVPB  10 mg Intravenous Once [name], MD 204 mL/hr at 05/14/22 0940 10 mg at 05/14/22 0940   fluorouracil (ADRUCIL) 4,450 mg in sodium chloride 0.9 % 61 mL chemo infusion  2,200 mg/m2 (Treatment Plan Recorded) Intravenous 1 day or 1 dose [name], MD       fosaprepitant (EMEND) 150 mg in sodium chloride 0.9 % 145 mL IVPB  150 mg Intravenous Once [name], MD       heparin lock flush 100 unit/mL  500 Units Intracatheter Once PRN [name], MD       irinotecan (CAMPTOSAR) 220 mg in sodium chloride 0.9 % 500 mL chemo infusion  110 mg/m2 (Treatment Plan Recorded) Intravenous Once [name], MD       leucovorin 808 mg in dextrose 5 % 250 mL infusion  400 mg/m2 (Treatment Plan Recorded) Intravenous Once [name], MD       oxaliplatin (ELOXATIN) 100 mg in dextrose 5 % 500 mL chemo infusion  50 mg/m2 (Treatment Plan Recorded) Intravenous Once [name], MD       sodium chloride flush (NS) 0.9 % injection 10 mL  10 mL Intracatheter PRN [name], MD        PHYSICAL EXAMINATION: ECOG PERFORMANCE STATUS: 2 - Symptomatic, <50% confined to bed  Vitals:   05/14/22 0842  BP: 125/89  Pulse: (!) 113  Resp: 18  Temp: 98.5 F (36.9 C)  SpO2: 99%   Wt Readings from Last 3 Encounters:  05/14/22 191 lb 11.2 oz (87 kg)  05/10/22 190 lb 2 oz (86.2 kg)  04/30/22 190 lb 14.7 oz (86.6 kg)     GENERAL:alert, no distress and comfortable SKIN: skin color normal, no rashes or significant lesions EYES: normal, Conjunctiva are pink and non-injected, sclera clear  ABDOMEN: (+) minimal bowel sounds, non-tender NEURO: alert & oriented x 3 with fluent speech  LABORATORY DATA:  I have reviewed the data as listed    Latest Ref Rng & Units 05/14/2022    8:05 AM 05/10/2022    9:24 AM 05/02/2022    5:50 AM  CBC  WBC 4.0 - 10.5 K/uL 8.4  11.5  9.7   Hemoglobin 12.0 - 15.0 g/dL 10.4  11.1  11.8   Hematocrit 36.0 - 46.0 % 29.8  31.4  34.4   Platelets 150 - 400 K/uL 289  253  294          Latest Ref Rng & Units 05/14/2022    8:05 AM 05/10/2022    9:24 AM 05/03/2022    5:06 AM  CMP  Glucose 70 - 99 mg/dL 153  161  155   BUN 8 - 23 mg/dL 29  34  25   Creatinine 0.44 - 1.00 mg/dL 0.98  0.96  0.93   Sodium 135 - 145 mmol/L 140  139  138   Potassium 3.5 - 5.1 mmol/L 4.0  4.1  4.6   Chloride 98 - 111 mmol/L 103  103  104   CO2 22 - 32 mmol/L '31  31  29   ' Calcium 8.9 - 10.3 mg/dL 9.1  9.4  8.9   Total Protein 6.5 - 8.1 g/dL 7.2  7.3    Total Bilirubin 0.3 - 1.2 mg/dL 0.5  0.5    Alkaline Phos 38 - 126 U/L 106  122    AST 15 - 41 U/L 14  18    ALT 0 - 44 U/L 10  18        RADIOGRAPHIC STUDIES: I have personally reviewed the radiological images as listed and agreed with the findings in the report. No results found.    Orders Placed This Encounter  Procedures   CBC with Differential ([place] Only)    Standing Status:   Future    Standing Expiration Date:   06/13/2023   CMP ([employer] only)    Standing Status:   Future    Standing Expiration Date:   06/13/2023   All questions were answered. The patient knows to call the clinic with any problems, questions or concerns. No barriers to learning was detected. The total time spent in the appointment was 30 minutes.     [name], MD 05/14/2022   I, Wilburn Mylar, am acting as scribe for [name], MD.   I have reviewed the above documentation for accuracy and completeness, and I agree with the above.

## 2022-05-15 ENCOUNTER — Other Ambulatory Visit: Payer: Self-pay

## 2022-05-15 LAB — CA 125: Cancer Antigen (CA) 125: 39 U/mL — ABNORMAL HIGH (ref 0.0–38.1)

## 2022-05-16 ENCOUNTER — Other Ambulatory Visit: Payer: Self-pay

## 2022-05-16 ENCOUNTER — Inpatient Hospital Stay: Payer: TRICARE For Life (TFL)

## 2022-05-16 VITALS — BP 123/90 | HR 110 | Temp 99.3°F | Resp 18

## 2022-05-16 DIAGNOSIS — C786 Secondary malignant neoplasm of retroperitoneum and peritoneum: Secondary | ICD-10-CM

## 2022-05-16 DIAGNOSIS — D490 Neoplasm of unspecified behavior of digestive system: Secondary | ICD-10-CM

## 2022-05-16 DIAGNOSIS — Z5111 Encounter for antineoplastic chemotherapy: Secondary | ICD-10-CM | POA: Diagnosis not present

## 2022-05-16 MED ORDER — HEPARIN SOD (PORK) LOCK FLUSH 100 UNIT/ML IV SOLN
500.0000 [IU] | Freq: Once | INTRAVENOUS | Status: AC | PRN
  Administered 2022-05-16: 500 [IU]

## 2022-05-16 MED ORDER — PEGFILGRASTIM-CBQV 6 MG/0.6ML ~~LOC~~ SOSY
6.0000 mg | PREFILLED_SYRINGE | Freq: Once | SUBCUTANEOUS | Status: AC
  Administered 2022-05-16: 6 mg via SUBCUTANEOUS
  Filled 2022-05-16: qty 0.6

## 2022-05-16 MED ORDER — SODIUM CHLORIDE 0.9% FLUSH
10.0000 mL | INTRAVENOUS | Status: DC | PRN
  Administered 2022-05-16: 10 mL

## 2022-05-20 ENCOUNTER — Telehealth: Payer: Self-pay

## 2022-05-20 NOTE — Telephone Encounter (Signed)
Pt's daughter called and LVM stating that the pt is experiencing sore throat.  Pt's daughter wanted to know if it was OK for the pt to take Vapor Cool sore throat lozenges.  Pt's daughter stated that the pt does not have a fever but is having difficulty talking d/t the soreness.  Returned call to pt but got voicemail.  LVM stating it is "OK" to take the Vapor cook lozenges.  Recommended the pt take Chloreseptic, gargle with warm salt water, and drink hot tea w/lemon & honey. Instructed pt to contact Dr. Ernestina Penna office should she develop a fever.  Notified Dr. Burr Medico of the pt's symptoms.

## 2022-05-21 ENCOUNTER — Other Ambulatory Visit: Payer: Self-pay

## 2022-05-21 ENCOUNTER — Telehealth: Payer: Self-pay

## 2022-05-21 ENCOUNTER — Inpatient Hospital Stay (HOSPITAL_COMMUNITY): Payer: Medicare Other

## 2022-05-21 ENCOUNTER — Encounter (HOSPITAL_COMMUNITY): Payer: Self-pay

## 2022-05-21 ENCOUNTER — Inpatient Hospital Stay (HOSPITAL_COMMUNITY)
Admission: EM | Admit: 2022-05-21 | Discharge: 2022-05-24 | DRG: 871 | Disposition: E | Payer: Medicare Other | Attending: Internal Medicine | Admitting: Internal Medicine

## 2022-05-21 ENCOUNTER — Emergency Department (HOSPITAL_COMMUNITY): Payer: Medicare Other

## 2022-05-21 DIAGNOSIS — I5032 Chronic diastolic (congestive) heart failure: Secondary | ICD-10-CM | POA: Diagnosis present

## 2022-05-21 DIAGNOSIS — Z86711 Personal history of pulmonary embolism: Secondary | ICD-10-CM | POA: Diagnosis present

## 2022-05-21 DIAGNOSIS — T451X5A Adverse effect of antineoplastic and immunosuppressive drugs, initial encounter: Secondary | ICD-10-CM

## 2022-05-21 DIAGNOSIS — C786 Secondary malignant neoplasm of retroperitoneum and peritoneum: Secondary | ICD-10-CM | POA: Diagnosis present

## 2022-05-21 DIAGNOSIS — R6520 Severe sepsis without septic shock: Secondary | ICD-10-CM | POA: Diagnosis present

## 2022-05-21 DIAGNOSIS — D701 Agranulocytosis secondary to cancer chemotherapy: Secondary | ICD-10-CM | POA: Diagnosis present

## 2022-05-21 DIAGNOSIS — Z7982 Long term (current) use of aspirin: Secondary | ICD-10-CM | POA: Diagnosis not present

## 2022-05-21 DIAGNOSIS — D61810 Antineoplastic chemotherapy induced pancytopenia: Secondary | ICD-10-CM | POA: Diagnosis present

## 2022-05-21 DIAGNOSIS — K56609 Unspecified intestinal obstruction, unspecified as to partial versus complete obstruction: Secondary | ICD-10-CM | POA: Diagnosis present

## 2022-05-21 DIAGNOSIS — R188 Other ascites: Secondary | ICD-10-CM | POA: Diagnosis present

## 2022-05-21 DIAGNOSIS — Z66 Do not resuscitate: Secondary | ICD-10-CM | POA: Diagnosis present

## 2022-05-21 DIAGNOSIS — I130 Hypertensive heart and chronic kidney disease with heart failure and stage 1 through stage 4 chronic kidney disease, or unspecified chronic kidney disease: Secondary | ICD-10-CM | POA: Diagnosis present

## 2022-05-21 DIAGNOSIS — J690 Pneumonitis due to inhalation of food and vomit: Secondary | ICD-10-CM

## 2022-05-21 DIAGNOSIS — N182 Chronic kidney disease, stage 2 (mild): Secondary | ICD-10-CM | POA: Diagnosis present

## 2022-05-21 DIAGNOSIS — E872 Acidosis, unspecified: Secondary | ICD-10-CM | POA: Diagnosis present

## 2022-05-21 DIAGNOSIS — K219 Gastro-esophageal reflux disease without esophagitis: Secondary | ICD-10-CM | POA: Diagnosis present

## 2022-05-21 DIAGNOSIS — Z515 Encounter for palliative care: Secondary | ICD-10-CM

## 2022-05-21 DIAGNOSIS — N179 Acute kidney failure, unspecified: Secondary | ICD-10-CM | POA: Diagnosis present

## 2022-05-21 DIAGNOSIS — Z7901 Long term (current) use of anticoagulants: Secondary | ICD-10-CM | POA: Diagnosis not present

## 2022-05-21 DIAGNOSIS — E860 Dehydration: Secondary | ICD-10-CM | POA: Diagnosis present

## 2022-05-21 DIAGNOSIS — A419 Sepsis, unspecified organism: Secondary | ICD-10-CM | POA: Diagnosis present

## 2022-05-21 LAB — CBC WITH DIFFERENTIAL/PLATELET
Abs Immature Granulocytes: 0 10*3/uL (ref 0.00–0.07)
Basophils Absolute: 0 10*3/uL (ref 0.0–0.1)
Basophils Relative: 0 %
Eosinophils Absolute: 0 10*3/uL (ref 0.0–0.5)
Eosinophils Relative: 0 %
HCT: 29.7 % — ABNORMAL LOW (ref 36.0–46.0)
Hemoglobin: 10 g/dL — ABNORMAL LOW (ref 12.0–15.0)
Immature Granulocytes: 0 %
Lymphocytes Relative: 90 %
Lymphs Abs: 0.3 10*3/uL — ABNORMAL LOW (ref 0.7–4.0)
MCH: 36 pg — ABNORMAL HIGH (ref 26.0–34.0)
MCHC: 33.7 g/dL (ref 30.0–36.0)
MCV: 106.8 fL — ABNORMAL HIGH (ref 80.0–100.0)
Monocytes Absolute: 0 10*3/uL — ABNORMAL LOW (ref 0.1–1.0)
Monocytes Relative: 3 %
Neutro Abs: 0 10*3/uL — CL (ref 1.7–7.7)
Neutrophils Relative %: 7 %
Platelets: 32 10*3/uL — ABNORMAL LOW (ref 150–400)
RBC: 2.78 MIL/uL — ABNORMAL LOW (ref 3.87–5.11)
RDW: 14.7 % (ref 11.5–15.5)
WBC: 0.3 10*3/uL — CL (ref 4.0–10.5)
nRBC: 0 % (ref 0.0–0.2)

## 2022-05-21 LAB — COMPREHENSIVE METABOLIC PANEL
ALT: 12 U/L (ref 0–44)
AST: 14 U/L — ABNORMAL LOW (ref 15–41)
Albumin: 2.5 g/dL — ABNORMAL LOW (ref 3.5–5.0)
Alkaline Phosphatase: 62 U/L (ref 38–126)
Anion gap: 11 (ref 5–15)
BUN: 65 mg/dL — ABNORMAL HIGH (ref 8–23)
CO2: 27 mmol/L (ref 22–32)
Calcium: 8.3 mg/dL — ABNORMAL LOW (ref 8.9–10.3)
Chloride: 105 mmol/L (ref 98–111)
Creatinine, Ser: 1.64 mg/dL — ABNORMAL HIGH (ref 0.44–1.00)
GFR, Estimated: 34 mL/min — ABNORMAL LOW (ref >60)
Glucose, Bld: 249 mg/dL — ABNORMAL HIGH (ref 70–99)
Potassium: 3.7 mmol/L (ref 3.5–5.1)
Sodium: 143 mmol/L (ref 135–145)
Total Bilirubin: 0.7 mg/dL (ref 0.3–1.2)
Total Protein: 7.5 g/dL (ref 6.5–8.1)

## 2022-05-21 LAB — LACTIC ACID, PLASMA
Lactic Acid, Venous: 2.5 mmol/L (ref 0.5–1.9)
Lactic Acid, Venous: 2.9 mmol/L (ref 0.5–1.9)
Lactic Acid, Venous: 4.5 mmol/L (ref 0.5–1.9)

## 2022-05-21 LAB — PROTIME-INR
INR: 1.3 — ABNORMAL HIGH (ref 0.8–1.2)
Prothrombin Time: 15.7 seconds — ABNORMAL HIGH (ref 11.4–15.2)

## 2022-05-21 LAB — LIPASE, BLOOD: Lipase: 23 U/L (ref 11–51)

## 2022-05-21 LAB — BRAIN NATRIURETIC PEPTIDE: B Natriuretic Peptide: 51.3 pg/mL (ref 0.0–100.0)

## 2022-05-21 MED ORDER — ONDANSETRON HCL 4 MG/2ML IJ SOLN
4.0000 mg | Freq: Four times a day (QID) | INTRAMUSCULAR | Status: DC | PRN
  Administered 2022-05-21: 4 mg via INTRAVENOUS
  Filled 2022-05-21: qty 2

## 2022-05-21 MED ORDER — METOCLOPRAMIDE HCL 5 MG/ML IJ SOLN
10.0000 mg | Freq: Three times a day (TID) | INTRAMUSCULAR | Status: DC | PRN
Start: 2022-05-21 — End: 2022-05-22
  Administered 2022-05-21: 10 mg via INTRAVENOUS
  Filled 2022-05-21: qty 2

## 2022-05-21 MED ORDER — ORAL CARE MOUTH RINSE: 15.0000 mL | OROMUCOSAL | Status: DC | PRN

## 2022-05-21 MED ORDER — METOCLOPRAMIDE HCL 5 MG/ML IJ SOLN
10.0000 mg | Freq: Once | INTRAMUSCULAR | Status: AC
  Administered 2022-05-21: 10 mg via INTRAVENOUS
  Filled 2022-05-21: qty 2

## 2022-05-21 MED ORDER — LACTATED RINGERS IV SOLN: INTRAVENOUS | Status: DC

## 2022-05-21 MED ORDER — ORAL CARE MOUTH RINSE: 15.0000 mL | OROMUCOSAL | Status: DC

## 2022-05-21 MED ORDER — IOHEXOL 300 MG/ML  SOLN: 100.0000 mL | Freq: Once | INTRAMUSCULAR | Status: DC | PRN

## 2022-05-21 MED ORDER — SODIUM CHLORIDE 0.9 % IV SOLN
2.0000 g | Freq: Once | INTRAVENOUS | Status: AC
  Administered 2022-05-21: 2 g via INTRAVENOUS
  Filled 2022-05-21: qty 12.5

## 2022-05-21 MED ORDER — MORPHINE SULFATE (PF) 2 MG/ML IV SOLN
1.0000 mg | INTRAVENOUS | Status: DC | PRN
  Administered 2022-05-21: 1 mg via INTRAVENOUS
  Filled 2022-05-21: qty 1

## 2022-05-21 MED ORDER — HALOPERIDOL LACTATE 5 MG/ML IJ SOLN: 0.5000 mg | INTRAMUSCULAR | Status: DC | PRN

## 2022-05-21 MED ORDER — LORAZEPAM 1 MG PO TABS: 1.0000 mg | ORAL_TABLET | ORAL | Status: DC | PRN

## 2022-05-21 MED ORDER — MORPHINE SULFATE (PF) 2 MG/ML IV SOLN
2.0000 mg | INTRAVENOUS | Status: DC | PRN
  Administered 2022-05-21: 2 mg via INTRAVENOUS
  Filled 2022-05-21: qty 1

## 2022-05-21 MED ORDER — LORAZEPAM 2 MG/ML PO CONC: 1.0000 mg | ORAL | Status: DC | PRN

## 2022-05-21 MED ORDER — IPRATROPIUM-ALBUTEROL 0.5-2.5 (3) MG/3ML IN SOLN: 3.0000 mL | Freq: Four times a day (QID) | RESPIRATORY_TRACT | Status: DC

## 2022-05-21 MED ORDER — HYDROMORPHONE HCL 2 MG/ML IJ SOLN
0.5000 mg | Freq: Once | INTRAMUSCULAR | Status: AC
  Administered 2022-05-21: 0.5 mg via INTRAVENOUS
  Filled 2022-05-21: qty 1

## 2022-05-21 MED ORDER — CHLORHEXIDINE GLUCONATE CLOTH 2 % EX PADS
6.0000 | MEDICATED_PAD | Freq: Every day | CUTANEOUS | Status: DC
  Administered 2022-05-21: 6 via TOPICAL

## 2022-05-21 MED ORDER — METRONIDAZOLE 500 MG/100ML IV SOLN: 500.0000 mg | Freq: Two times a day (BID) | INTRAVENOUS | Status: DC

## 2022-05-21 MED ORDER — HALOPERIDOL LACTATE 2 MG/ML PO CONC: 0.5000 mg | ORAL | Status: DC | PRN

## 2022-05-21 MED ORDER — LORAZEPAM 2 MG/ML IJ SOLN: 1.0000 mg | INTRAMUSCULAR | Status: DC | PRN

## 2022-05-21 MED ORDER — PROMETHAZINE HCL 25 MG RE SUPP: 25.0000 mg | Freq: Four times a day (QID) | RECTAL | Status: DC | PRN

## 2022-05-21 MED ORDER — HALOPERIDOL 1 MG PO TABS: 0.5000 mg | ORAL_TABLET | ORAL | Status: DC | PRN

## 2022-05-21 MED ORDER — LACTATED RINGERS IV BOLUS
1000.0000 mL | Freq: Once | INTRAVENOUS | Status: AC
  Administered 2022-05-21: 1000 mL via INTRAVENOUS

## 2022-05-21 MED ORDER — ONDANSETRON HCL 4 MG PO TABS: 4.0000 mg | ORAL_TABLET | Freq: Four times a day (QID) | ORAL | Status: DC | PRN

## 2022-05-21 MED ORDER — SODIUM CHLORIDE 0.9 % IV SOLN: 2.0000 g | Freq: Two times a day (BID) | INTRAVENOUS | Status: DC

## 2022-05-22 DIAGNOSIS — J690 Pneumonitis due to inhalation of food and vomit: Secondary | ICD-10-CM

## 2022-05-24 LAB — CULTURE, BLOOD (ROUTINE X 2)
Special Requests: ADEQUATE
Special Requests: ADEQUATE

## 2022-05-24 NOTE — ED Provider Notes (Signed)
[facility] DEPT Provider Note   CSN: [id number] Arrival date & time: 06-17-22  9379     History  Chief Complaint  Patient presents with   Emesis   Abdominal Pain    Brenda Stewart is a 71 y.o. female.   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  Family member who is her daughter gives most of the history but patient answers questions as well.  Her daughter reports since leaving the hospital on 05/02/2022 she has had persistent vomiting for the last 2 weeks.  She was doing TPN 18 hours a day but yesterday it was decreased to 12 hours a day.  Last round of chemotherapy was last Thursday.  Patient reports she is having a cough with brown sputum but has not had any fever.  She feels generally short of breath and reports very minimal abdominal pain.  She does not have a history of lung disease and does not use inhalers regularly.  She did have an echo done in 2022 with an EF of 60 to 65%.  She has noticed some swelling in her legs recently in addition to the shortness of breath.  She does take Lasix at home.  The history is provided by the patient and medical records.  Emesis Associated symptoms: abdominal pain   Abdominal Pain Associated symptoms: vomiting        Home Medications Prior to Admission medications   Medication Sig Start Date End Date Taking? Authorizing Provider  acetaminophen-codeine (TYLENOL #3) 300-30 MG tablet Take 1 tablet by mouth every 6 (six) hours as needed for moderate pain. 05/10/22   [name], MD  amLODipine (NORVASC) 5 MG tablet Take 1 tablet (5 mg total) by mouth daily. 05/10/22   [name], MD  aspirin EC 81 MG tablet Take 81 mg by mouth daily. Swallow whole.    [provider]  Cholecalciferol (VITAMIN D3) 50 MCG (2000 UT) capsule Take 2,000 Units by mouth daily.    [provider]  Cyanocobalamin (VITAMIN B-12 PO) Take 1 capsule by mouth daily.    [provider]  ELIQUIS 5 MG TABS tablet TAKE 1 TABLET BY MOUTH TWICE DAILY 04/29/22   [name], MD  furosemide (LASIX) 20 MG tablet TAKE 1 TABLET BY MOUTH ONCE DAILY ON MONDAY, WEDNESDAY, AND FRIDAY. TAKE WITH POTASSIUM 05/13/22   [name], MD  ondansetron (ZOFRAN-ODT) 4 MG disintegrating tablet Take 1 tablet (4 mg total) by mouth daily as needed for nausea or vomiting. 05/14/22   [name], MD  potassium chloride SA (KLOR-CON M20) 20 MEQ tablet TAKE 1 TABLET BY MOUTH ONCE DAILY ON MONDAY, [place], AND FRIDAY. TAKE WITH LASIX Patient taking differently: Take 20 mEq by mouth See admin instructions. Take 20 mEq once daily on Monday, Wednesday, and Friday. 03/13/22   [name], MD  prochlorperazine (COMPAZINE) 10 MG tablet Take 1 tablet (10 mg total) by mouth every 6 (six) hours as needed for nausea or vomiting. Patient taking differently: Take 10 mg by mouth daily as needed for nausea or vomiting. 06/04/21   [name], MD  promethazine (PHENERGAN) 25 MG suppository Place 1 suppository (25 mg total) rectally every 6 (six)  hours as needed for nausea or vomiting. 05/10/22   [name], MD  sennosides-docusate sodium (SENOKOT-S) 8.6-50 MG tablet Take 2 tablets by mouth daily as needed for constipation.    [provider]  vitamin C (ASCORBIC ACID) 250 MG tablet Take 250 mg by mouth daily.    [provider]  gabapentin (NEURONTIN) 100 MG capsule Take 1 capsule (100 mg total) by mouth at bedtime. Patient not taking: Reported on 02/27/2021 10/16/16 04/30/21  Lysbeth Penner, FNP      Allergies    Patient has no known allergies.    Review of Systems   Review of Systems  Gastrointestinal:  Positive  for abdominal pain and vomiting.    Physical Exam Updated Vital Signs BP 101/64   Pulse (!) 136   Temp 98.4 F (36.9 C) (Oral)   Resp (!) 32   SpO2 98%  Physical Exam Vitals and nursing note reviewed.  Constitutional:      General: She is not in acute distress.    Appearance: She is well-developed. She is ill-appearing.  HENT:     Head: Normocephalic and atraumatic.  Eyes:     Pupils: Pupils are equal, round, and reactive to light.  Cardiovascular:     Rate and Rhythm: Regular rhythm. Tachycardia present.     Heart sounds: Normal heart sounds. No murmur heard.    No friction rub.  Pulmonary:     Effort: Pulmonary effort is normal. Tachypnea present.     Breath sounds: No wheezing or rales.     Comments: Occasional wheeze, minimal crackles at the base.  Port present in the right upper chest Abdominal:     General: Bowel sounds are normal. There is distension.     Palpations: Abdomen is soft.     Tenderness: There is no abdominal tenderness. There is no guarding or rebound.  Musculoskeletal:        General: No tenderness. Normal range of motion.     Right lower leg: Edema present.     Left lower leg: Edema present.     Comments: No edema  Skin:    General: Skin is warm and dry.     Coloration: Skin is pale.     Findings: No rash.  Neurological:     Mental Status: She is alert and oriented to person, place, and time. Mental status is at baseline.     Cranial Nerves: No cranial nerve deficit.  Psychiatric:        Behavior: Behavior normal.     ED Results / Procedures / Treatments   Labs (all labs ordered are listed, but only abnormal results are displayed) Labs Reviewed  COMPREHENSIVE METABOLIC PANEL - Abnormal; Notable for the following components:      Result Value   Glucose, Bld 249 (*)    BUN 65 (*)    Creatinine, Ser 1.64 (*)    Calcium 8.3 (*)    Albumin 2.5 (*)    AST 14 (*)    GFR, Estimated 34 (*)    All other components within normal limits  LACTIC  ACID, PLASMA - Abnormal; Notable for the following components:   Lactic Acid, Venous 2.5 (*)    All other components within normal limits  CBC WITH DIFFERENTIAL/PLATELET - Abnormal; Notable for the following components:   WBC 0.3 (*)    RBC 2.78 (*)    Hemoglobin 10.0 (*)    HCT 29.7 (*)    MCV 106.8 (*)  MCH 36.0 (*)    Platelets 32 (*)    Neutro Abs 0.0 (*)    Lymphs Abs 0.3 (*)    Monocytes Absolute 0.0 (*)    All other components within normal limits  PROTIME-INR - Abnormal; Notable for the following components:   Prothrombin Time 15.7 (*)    INR 1.3 (*)    All other components within normal limits  CULTURE, BLOOD (ROUTINE X 2)  CULTURE, BLOOD (ROUTINE X 2)  URINE CULTURE  LIPASE, BLOOD  LACTIC ACID, PLASMA  URINALYSIS, ROUTINE W REFLEX MICROSCOPIC  BRAIN NATRIURETIC PEPTIDE    EKG None  Radiology DG Chest Port 1 View  Result Date: June 12, 2022 CLINICAL DATA:  Abdominal pain and vomiting, hypotensive, tachycardia, possible sepsis EXAM: PORTABLE CHEST 1 VIEW COMPARISON:  None Available. FINDINGS: The heart size and mediastinal contours are within normal limits. Right chest port catheter. Right upper extremity PICC. Both lungs are clear. The visualized skeletal structures are unremarkable. IMPRESSION: No acute abnormality of the lungs in AP portable projection. Electronically Signed   By: Delanna Ahmadi M.D.   On: 06/12/2022 11:02    Procedures Procedures    Medications Ordered in ED Medications  lactated ringers infusion ( Intravenous New Bag/Given 06-12-2022 1253)  lactated ringers bolus 1,000 mL (1,000 mLs Intravenous New Bag/Given 2022-06-12 1253)  HYDROmorphone (DILAUDID) injection 0.5 mg (0.5 mg Intravenous Given June 12, 2022 1309)  metoCLOPramide (REGLAN) injection 10 mg (10 mg Intravenous Given 06-12-2022 1309)    ED Course/ Medical Decision Making/ A&P                           Medical Decision Making Amount and/or Complexity of Data Reviewed Labs:  ordered. Radiology: ordered.  Risk Prescription drug management.   Pt with multiple medical problems and comorbidities and presenting today with a complaint that caries a high risk for morbidity and mortality.  Patient here today with cough, shortness of breath, swelling in her legs and ongoing vomiting.  Patient is currently receiving TPN due to small bowel obstruction from malignancy.  She continues to have intermittent bouts of vomiting which has not changed since discharge.  Patient now however is having a productive cough with brown sputum.  She has not had any fever.  On exam she is tachypneic and appears uncomfortable.  Concern for possible fluid overload versus new infection versus aspiration pneumonia versus electrolyte abnormality versus acute kidney injury versus pancytopenia and anemia versus pericardial effusion and tamponade.  Patient did receive chemotherapy last Thursday and Dr. Ernestina Penna office did inform the patient that she was neutropenic over the phone and that she should come to the emergency room.  Patient's oxygen saturation is preserved but giving her work of breathing she was placed on oxygen for comfort.  1:16 PM I independently interpreted patient's labs today and patient is neutropenic with a white count of 0.3 and an absolute neutrophil count of 0 as well as thrombocytopenic with a platelet count of 32.  Hemoglobin remained stable at 10.  CMP today with new AKI with a creatinine of 1.64 from her baseline of 0.8 with elevated BUN of 65 also hyperglycemia today with a blood sugar of 249, LFTs and bilirubin are normal, lipase is within normal limits, lactic acid is elevated at 2.5. I have independently visualized and interpreted pt's images today.  Dust x-ray is normal today.  Bedside ultrasound without evidence of pericardial effusion.  Patient is still tachycardic and tachypneic.  She is  suctioning stomach contents from her mouth as she most likely needs an NG but reports they  tried multiple times at her last admission and were unable to get one placed.  Discussed with patient that she needed admission.  However patient does not want to be admitted to the hospital.  Spoke with oncology and left her oncologist Dr. Burr Medico a message but also spoke with the on-call oncologist Dr. Alen Blew who also recommended that patient be admitted.  Patient is not displaying frank signs of sepsis such as fever, hypotension but she is tachycardic and tachypneic and is on chemotherapy and has no absolute neutrophils at this time.  Blood cultures have been done.  We will check a urine but we will also do a CT of chest abdomen pelvis without contrast to look for any other source.  Patient was covered with a dose of cefepime.  Hospitalist to see and admit.  CRITICAL CARE Performed by: Arrion Burruel Total critical care time: 30 minutes Critical care time was exclusive of separately billable procedures and treating other patients. Critical care was necessary to treat or prevent imminent or life-threatening deterioration. Critical care was time spent personally by me on the following activities: development of treatment plan with patient and/or surrogate as well as nursing, discussions with consultants, evaluation of patient's response to treatment, examination of patient, obtaining history from patient or surrogate, ordering and performing treatments and interventions, ordering and review of laboratory studies, ordering and review of radiographic studies, pulse oximetry and re-evaluation of patient's condition.           Final Clinical Impression(s) / ED Diagnoses Final diagnoses:  SBO (small bowel obstruction) (HCC)  Peritoneal carcinomatosis (HCC)  Chemotherapy-induced neutropenia (HCC)  AKI (acute kidney injury) ([facility])  Dehydration    Rx / DC Orders ED Discharge Orders     None         Blanchie Dessert, MD 2022/06/15 1316

## 2022-05-24 NOTE — Assessment & Plan Note (Addendum)
As evidenced by tachycardia, tachypnea, lactic acidosis and severe neutropenia with an ANC of 21. Source of sepsis appears to be most likely GI from patient's known malignant small bowel obstruction and worsening abdominal pain with refractory nausea and vomiting versus possible aspiration from refractory nausea and vomiting. Patient has been coughing up feculent material Aggressive IV fluid resuscitation Follow-up results of CT scan of abdomen and pelvis unavailable at the time of this H&P Place patient empirically on cefepime and Flagyl for anaerobic coverage Palliative care consult

## 2022-05-24 NOTE — Progress Notes (Signed)
Reviewed CT scan findings and patient noted to have worsening distal small bowel obstruction with increasing fluid distention of the esophagus, stomach and proximal small bowel.  Findings likely secondary to peritoneal carcinomatosis.  Patient also noted to have ill-defined nodularity in both lungs favored to be infectious most likely secondary to aspiration. Discussed CT scan findings with patient and her daughter at the bedside and explained to the patient in detail that her small bowel obstruction is inoperable and is worse. She understands that she is septic from aspiration pneumonia related to worsening small bowel obstruction from peritoneal carcinomatosis. Discussed comfort measures in detail with patient and her daughter and goals of care will be shifted from curative to comfort and symptom management. They both verbalized understanding and agree with the plan. RN informed that patient will be placed on comfort measures.

## 2022-05-24 NOTE — Discharge Summary (Signed)
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 [facility] Course: No notes on file Brenda Stewart is a 71 y.o. 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 status.  NG tube decompression was not successful during that hospitalization. Patient is status post recent chemotherapy which was done on 05/16/22 and per her daughter they were asked to come to the hospital because her cell counts were low. Per her daughter since her hospital discharge she has had persistent nausea and vomiting and now has a cough productive of feculent material.  She also complained of worsening abdominal pain which is diffuse and her last bowel movement was almost a week ago. At rest she appears to be in distress and is noted to be tachycardic and tachypneic on the monitor.  Her cough appears refractory and patient is coughing up dark feculent material. Labs show pancytopenia as well as worsening renal function 0.98 >> 1.69.  Lactate is elevated at 2.5. Patient was initially admitted for  sepsis from aspiration pneumonia secondary to refractory nausea and vomiting and continued to decline.  Family meeting was held and patient consented to being placed on comfort measures.  Patient expired at 1814 on May 23, 2022 Assessment and Plan: * Sepsis ([place]) As evidenced by tachycardia, tachypnea, lactic acidosis and severe neutropenia with an [place] of 21. Source of sepsis appears to be most likely GI from patient's known malignant small bowel obstruction and worsening abdominal pain with refractory nausea and vomiting versus possible aspiration from refractory nausea and vomiting. Patient has been coughing up feculent material Aggressive IV fluid resuscitation Follow-up results of CT scan of abdomen and pelvis unavailable at the time of this H&P Place patient empirically on cefepime and Flagyl for anaerobic coverage Palliative care consult  AKI (acute kidney injury) ([place]) Secondary to dehydration from refractory nausea and vomiting and poor oral intake Patient has a baseline serum creatinine of 0.98 but today on admission it is 1.69. Aggressive IV fluid resuscitation Repeat renal parameters  Small bowel obstruction ([place]) Patient with a history of signet ring adenocarcinoma with peritoneal carcinomatosis who now has a malignant obstruction related to peritoneal cancer progression with associated ascites. Seen by surgery during her last hospitalization and is not a surgical candidate. 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  She was then placed on maintenance Xeloda but recently resumed chemotherapy with her first treatment on 05/16/22. We will consult oncology  Aspiration pneumonia ([place]) Secondary to  refractory emesis from malignant bowel obstruction. Patient has had infiltrates in both lower lung zones Patient was started on cefepime and Flagyl  Antineoplastic chemotherapy induced pancytopenia (CODE) ([place]) Patient noted to have pancytopenia following recent chemotherapy. She has an [place] of 21 Discussed with oncology who does not recommend granulocyte colony-stimulating factor at this time Monitor closely for bleeding from thrombocytopenia  History of pulmonary embolus (PE) Hold Eliquis due to thrombocytopenia with increased risk for bleeding         Consultants: None Procedures performed: None  Disposition:  Patient expired Diet recommendation:  NPO patient expired DISCHARGE MEDICATION: Allergies as of Jun 16, 2022   No Known Allergies      Medication List     ASK your doctor about these medications    acetaminophen-codeine 300-30 MG tablet Commonly known as: TYLENOL #3 Take 1 tablet by mouth every 6 (six) hours as needed for moderate pain.   amLODipine 5 MG tablet Commonly known as: NORVASC Take 1 tablet (5 mg total) by mouth daily.   aspirin EC 81 MG tablet Take 81 mg by mouth daily. Swallow whole.   Eliquis 5 MG Tabs tablet Generic drug: apixaban TAKE 1 TABLET BY MOUTH TWICE DAILY   furosemide 20 MG tablet Commonly known as: LASIX TAKE 1 TABLET BY MOUTH ONCE DAILY ON MONDAY, WEDNESDAY, AND FRIDAY. TAKE WITH POTASSIUM   ondansetron 4 MG disintegrating tablet Commonly known as: ZOFRAN-ODT Take 1 tablet (4 mg total) by mouth daily as needed for nausea or vomiting.   potassium chloride SA 20 MEQ tablet Commonly known as: [place]-Con M20 TAKE 1 TABLET BY MOUTH ONCE DAILY ON MONDAY, WEDNESDAY, AND FRIDAY. TAKE WITH LASIX   promethazine 25 MG suppository Commonly known as: PHENERGAN Place 1 suppository (25 mg total) rectally every 6 (six) hours as needed for nausea or vomiting.   VITAMIN B-12 PO Take 1 capsule by mouth daily.   vitamin C 250 MG  tablet Commonly known as: ASCORBIC ACID Take 250 mg by mouth daily.   Vitamin D3 50 MCG (2000 UT) capsule Take 2,000 Units by mouth daily.        Discharge Exam: Filed Weights   06/16/22 1600 06-16-22 1814  Weight: 81.8 kg 81.8 kg   Patient expired  Condition at discharge:  Patient expired  The results of significant diagnostics from this hospitalization (including imaging, microbiology, ancillary and laboratory) are listed below for reference.   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 Recent hospitalization for bowel obstruction. Persistent nausea and vomiting with cough productive of feculent material. Worsening abdominal pain. Metastatic disease evaluation. * Tracking Code: BO * EXAM: CT CHEST, ABDOMEN AND PELVIS WITHOUT CONTRAST TECHNIQUE: Multidetector CT imaging of the chest, abdomen and pelvis was performed following the standard protocol without IV contrast. RADIATION DOSE REDUCTION: This exam was performed according to the departmental dose-optimization program which includes automated exposure control, adjustment of the mA and/or kV according to patient size and/or use of iterative reconstruction technique. COMPARISON:  Abdominopelvic CT 04/25/2022.  Chest CT 01/08/2022. FINDINGS: CT CHEST FINDINGS Cardiovascular: Right IJ Port-A-Cath and right arm PICC are in place, extending to the superior cavoatrial junction. No acute vascular findings on noncontrast imaging. The heart size is normal. There is no pericardial effusion. Mediastinum/Nodes: There are no enlarged mediastinal, hilar or axillary lymph nodes. Hilar assessment is limited by the lack of intravenous contrast, although the  hilar contours appear unchanged. The thoracic esophagus is diffusely distended and fluid filled. The thyroid gland and trachea demonstrate no significant findings.  Lungs/Pleura: No pleural effusion or pneumothorax. New patchy airspace disease dependently in the left lower lobe which could reflect early aspiration pneumonia. In addition, scattered ill-defined nodular densities are present within the right lung, many of which are clustered. Most of these are part solid or ground-glass in nature. Largest nodule in the right upper lobe measures 4 mm on image 38/4. Musculoskeletal/Chest wall: No chest wall mass or suspicious osseous findings. Diffuse thoracic spondylosis. CT ABDOMEN AND PELVIS FINDINGS Hepatobiliary: The liver appears unremarkable as imaged in the noncontrast state. No evidence of gallstones, gallbladder wall thickening or biliary dilatation. Pancreas: Unremarkable. No pancreatic ductal dilatation or surrounding inflammatory changes. Spleen: Normal in size without focal abnormality. Adrenals/Urinary Tract: Both adrenal glands appear normal. No evidence of urinary tract calculus or hydronephrosis. The bladder appears normal for its degree of distention. Stomach/Bowel: No enteric contrast administered. As above, progressive fluid distension of the esophagus. The stomach and proximal small bowel also demonstrate progressive distension with fluid. The distal small bowel and colon appear decompressed. There is some residual contrast material within the decompressed colon. Vascular/Lymphatic: There are no enlarged abdominal or pelvic lymph nodes. Mild aortic and branch vessel atherosclerosis. Reproductive: Hysterectomy.  No discrete adnexal mass. Other: A moderate amount of ascites is again noted, similar in volume to the recent prior study. Peritoneal nodularity bilaterally in the pelvis is grossly stable, suboptimally evaluated without intravenous contrast. No free intraperitoneal air or focal fluid collection identified. Musculoskeletal: No acute or significant osseous findings. Stable endplate degenerative changes at L4-5. IMPRESSION: 1. Findings are consistent with  a recurrent or persistent distal small-bowel obstruction. There is increasing fluid distension of the esophagus, stomach and proximal small bowel. No clear cause for this obstruction is identified, and findings may be secondary to peritoneal carcinomatosis. Patient may benefit from nasogastric tube decompression of the stomach and esophagus. 2. Ascites with peritoneal nodularity consistent with peritoneal carcinomatosis, similar to recent prior examination. 3. No definitive signs of metastatic disease in the chest. Ill-defined clustered nodularity in the right lung is favored to be infectious/inflammatory. There is airspace disease dependently in the left lower lobe which could reflect aspiration. 4. No evidence of solid organ metastasis, osseous metastatic disease or ureteral obstruction. Electronically Signed   By: Richardean Sale M.D.   On: 2022/06/09 15:42   DG Chest Port 1 View  Result Date: 06/09/2022 CLINICAL DATA:  Abdominal pain and vomiting, hypotensive, tachycardia, possible sepsis EXAM: PORTABLE CHEST 1 VIEW COMPARISON:  None Available. FINDINGS: The heart size and mediastinal contours are within normal limits. Right chest port catheter. Right upper extremity PICC. Both lungs are clear. The visualized skeletal structures are unremarkable. IMPRESSION: No acute abnormality of the lungs in AP portable projection. Electronically Signed   By: Delanna Ahmadi M.D.   On: 06-09-22 11:02   [place] EKG SITE RITE  Result Date: 04/29/2022 If Site Rite image not attached, placement could not be confirmed due to current cardiac rhythm.  DG Abd Portable 2V  Result Date: 04/28/2022 CLINICAL DATA:  Abdominal pain with nausea. EXAM: PORTABLE ABDOMEN - 2 VIEW COMPARISON:  04/26/2022 FINDINGS: Upright film shows no evidence for intraperitoneal free air. Mild gaseous small bowel distention in the left upper quadrant is similar at 3.8 cm diameter today compared to 3.2 cm previously. Degenerative changes noted lower  lumbar spine. IMPRESSION: Mild gaseous small bowel distention in the left  upper quadrant, similar to prior. Electronically Signed   By: Misty Stanley M.D.   On: 04/28/2022 12:54   DG Abd 1 View  Result Date: 04/26/2022 CLINICAL DATA:  Small bowel obstruction EXAM: ABDOMEN - 1 VIEW COMPARISON:  CT and radiography from yesterday FINDINGS: Unchanged gas filled loops of bowel in the central abdomen measuring up to 3.1 cm. Ascites seen to depressed the bladder. No evidence of pneumoperitoneum. IMPRESSION: Partial small bowel obstruction by recent CT. Unchanged bowel gas pattern. Electronically Signed   By: Jorje Guild M.D.   On: 04/26/2022 05:52   CT ABDOMEN PELVIS W CONTRAST  Result Date: 04/25/2022 CLINICAL DATA:  Bowel obstruction suspected. Vomiting. Patient presents from the cancer center. EXAM: CT ABDOMEN AND PELVIS WITH CONTRAST TECHNIQUE: Multidetector CT imaging of the abdomen and pelvis was performed using the standard protocol following bolus administration of intravenous contrast. RADIATION DOSE REDUCTION: This exam was performed according to the departmental dose-optimization program which includes automated exposure control, adjustment of the mA and/or kV according to patient size and/or use of iterative reconstruction technique. CONTRAST:  171m OMNIPAQUE IOHEXOL 300 MG/ML  SOLN COMPARISON:  01/08/2022 FINDINGS: Lower chest: Mild dependent atelectasis in the lung bases. Small esophageal hiatal hernia. Hepatobiliary: No focal liver abnormality is seen. No gallstones, gallbladder wall thickening, or biliary dilatation. Pancreas: Unremarkable. No pancreatic ductal dilatation or surrounding inflammatory changes. Spleen: Normal in size without focal abnormality. Adrenals/Urinary Tract: Adrenal glands are unremarkable. Kidneys are normal, without renal calculi, focal lesion, or hydronephrosis. Bladder is unremarkable. Stomach/Bowel: Mild distention of small-bowel loops with gas and fluid. Mild small  bowel wall thickening is suggested. Fecalization of small bowel contents. Decompression of the terminal ileum with transition zone suggested in the right lower quadrant. Decompressed stool-filled colon. Appendix is not identified. Vascular/Lymphatic: Aortic atherosclerosis. No enlarged abdominal or pelvic lymph nodes. Mesenteric vessels appear patent. Reproductive: Status post hysterectomy. No adnexal masses. Other: Moderate diffuse free fluid throughout the abdomen and pelvis. Nodular stranding in the mesentery and omentum. Peritoneal wall thickening. Changes are consistent with peritoneal carcinomatosis. Appearances have progressed since the previous study. No free air. Abdominal wall musculature appears intact. Musculoskeletal: Degenerative changes.  No acute bony abnormalities. IMPRESSION: 1. Moderate free fluid throughout the abdomen and pelvis with peritoneal wall thickening and implant suggesting peritoneal carcinomatosis, progressing since prior study. 2. Mildly dilated small bowel with wall thickening and fecalization of contents. Decompressed terminal ileum with transition zone in the right lower quadrant. Appearance is consistent with small-bowel obstruction. 3. Small esophageal hiatal hernia. 4. Aortic atherosclerosis. Electronically Signed   By: WLucienne CapersM.D.   On: 04/25/2022 17:23   DG Abd 2 Views  Result Date: 04/25/2022 CLINICAL DATA:  recurrent abdominal pain and nausea, rule out bowel obstruction EXAM: ABDOMEN - 2 VIEW COMPARISON:  CT 01/08/2022 FINDINGS: There are multiple mildly dilated loops of small bowel with air-fluid level. Mild-to-moderate stool burden. No radiopaque calculi overlie the kidneys. IMPRESSION: Multiple mildly dilated loops of small bowel with air-fluid levels. Findings could potentially represent a small-bowel obstruction. Consider CT. These results will be called to the ordering clinician or representative by the Radiologist Assistant, and communication documented  in the PACS or [employer] Electronically Signed   By: JMaurine SimmeringM.D.   On: 04/25/2022 13:15    Microbiology: Results for orders placed or performed during the hospital encounter of 009-03-23 Culture, blood (Routine x 2)     Status: None (Preliminary result)   Collection Time: 02023-05-310:04 AM  Specimen: BLOOD  Result Value Ref Range Status   Specimen Description   Final    BLOOD PORTA CATH Performed at [facility] [address], [place], [place] [address]    Special Requests   Final    BOTTLES DRAWN AEROBIC AND ANAEROBIC Blood Culture adequate volume Performed at [facility] [address] [place], [place] [address]    Culture  Setup Time   Final    GRAM NEGATIVE RODS IN BOTH AEROBIC AND ANAEROBIC BOTTLES  CRITICAL NOT CALLED PATIENT EXPIRED A. LAFRANCE    Culture   Final    GRAM NEGATIVE RODS TOO YOUNG TO READ Performed at [facility], [place] [address], [place], [place] [address]    Report Status PENDING  Incomplete  Culture, blood (Routine x 2)     Status: None (Preliminary result)   Collection Time: 2022/06/10  2:04 PM   Specimen: BLOOD  Result Value Ref Range Status   Specimen Description   Final    BLOOD SITE NOT SPECIFIED Performed at [facility] [address], [place], [place] [address]    Special Requests   Final    BOTTLES DRAWN AEROBIC AND ANAEROBIC Blood Culture adequate volume Performed at [facility] [address] [place], [place] [address]    Culture  Setup Time   Final    GRAM NEGATIVE RODS IN BOTH AEROBIC AND ANAEROBIC BOTTLES  CRITICAL NOT CALLED PATIENT EXPIRED A. LAFRANCE 05/22/22 0500    Culture   Final    NO GROWTH < 12 HOURS Performed at [facility] [address] [place],  [address]    Report Status PENDING  Incomplete    Labs: CBC: Recent Labs  Lab 06/10/22 1124  WBC 0.3*  NEUTROABS 0.0*  HGB 10.0*  HCT 29.7*  MCV  106.8*  PLT 32*   Basic Metabolic Panel: Recent Labs  Lab 06-10-22 1124  NA 143  K 3.7  CL 105  CO2 27  GLUCOSE 249*  BUN 65*  CREATININE 1.64*  CALCIUM 8.3*   Liver Function Tests: Recent Labs  Lab 10-Jun-2022 1124  AST 14*  ALT 12  ALKPHOS 62  BILITOT 0.7  PROT 7.5  ALBUMIN 2.5*   CBG: No results for input(s): "GLUCAP" in the last 168 hours.  Discharge time spent: greater than 30 minutes.  Signed: Collier Bullock, MD [facility] 05/22/2022

## 2022-05-24 NOTE — Progress Notes (Addendum)
After transitioning to comfort care, patient received 1 mg morphine and PRN Reglan for ongoing pain and emesis. Soon after, patient became unresponsive and VS further deteriorated. RN and patient's daughter Santiago Glad at bedside. Patient's TOD 1814. Patient was pronounced by Chinle Sink, RN and Cyndie Chime, RN. Dr Francine Graven notified by this RN. Patient's daughter took home patient's cell phone. Post mortem checklist started by RN. E-link RN starting [employer].

## 2022-05-24 NOTE — Progress Notes (Signed)
Pharmacy Antibiotic Note  Brenda Stewart is a 71 y.o. female admitted on Jun 06, 2022 with sepsis.  Pharmacy has been consulted for Cefepime dosing.  Plan: Cefepime 2g IV q12h  Follow up renal function, culture results, and clinical course.      Temp (24hrs), Avg:98.6 F (37 C), Min:98.4 F (36.9 C), Max:98.8 F (37.1 C)  Recent Labs  Lab 06/06/22 1123 06-06-22 1124 06/06/22 1404  WBC  --  0.3*  --   CREATININE  --  1.64*  --   LATICACIDVEN 2.5*  --  2.9*    Estimated Creatinine Clearance: 36.7 mL/min (A) (by C-G formula based on SCr of 1.64 mg/dL (H)).    No Known Allergies  Antimicrobials this admission: 8/29 Cefepime >>   Dose adjustments this admission:   Microbiology results: 8/29 BCx:   Thank you for allowing pharmacy to be a part of this patient's care.  Gretta Arab PharmD, BCPS Clinical Pharmacist [facility] main pharmacy [phone] 06-06-2022 2:45 PM

## 2022-05-24 NOTE — Assessment & Plan Note (Signed)
Patient with a known history of signet ring adenocarcinoma with peritoneal carcinomatosis and now with malignant obstruction related to peritoneal cancer progression. 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  She was then placed on maintenance Xeloda but recently resumed chemotherapy with her first treatment on 05/16/22. We will consult oncology

## 2022-05-24 NOTE — ED Triage Notes (Signed)
Pt reports abdominal pain and vomiting x2 weeks. Pt had last chemo treatment on Thursday is hypotensive and tachycardiac in triage.

## 2022-05-24 NOTE — Telephone Encounter (Signed)
Critical Lab Values reported.  WBC 0.6; ANC 0.1; and Plts 64  Notified Dr. Burr Medico and Cira Rue, NP

## 2022-05-24 NOTE — H&P (Addendum)
History and Physical    Patient: Brenda Stewart:[id number] DOB: Nov 29, 1950 DOA: 06/20/2022 DOS: the patient was seen and examined on 2022/06/20 PCP: [name], MD  Patient coming from: Home  Chief Complaint:  Chief Complaint  Patient presents with   Emesis   Abdominal Pain   Patient is unable to provide any history as she appears to be in distress.  Most of the history was provided by her daughter. HPI: Brenda Stewart is a 71 y.o. 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 status.  NG tube decompression was not successful during that hospitalization. Patient is status post recent chemotherapy which was done on 05/16/22 and per her daughter they were asked to come to the hospital because her cell count was low. Per her daughter since her hospital discharge she has had persistent nausea and vomiting and now has a cough productive of feculent material.  She also complains of worsening abdominal pain which is diffuse and her last bowel movement was almost a week ago. At rest she appears to be in distress and is noted to be tachycardic and tachypneic on the monitor.  Her cough appears refractory and patient is coughing up dark feculent material. Labs show pancytopenia as well as worsening renal function 0.98 >> 1.69.  Lactate is elevated at 2.5   Review of Systems: unable to review all systems due to the inability of the patient to answer questions. Past Medical History:  Diagnosis Date   Acid reflux    Hypertension    Past Surgical History:  Procedure Laterality Date   BIOPSY  05/07/2021   Procedure: BIOPSY;  Surgeon: [name], MD;  Location: [facility];  Service: Endoscopy;;   BUNIONECTOMY Bilateral    ESOPHAGEAL BRUSHING  05/07/2021   Procedure: ESOPHAGEAL BRUSHING;  Surgeon: [name], MD;  Location: [facility];  Service: Endoscopy;;   ESOPHAGOGASTRODUODENOSCOPY (EGD) WITH PROPOFOL N/A 05/07/2021   Procedure: ESOPHAGOGASTRODUODENOSCOPY (EGD) WITH PROPOFOL;  Surgeon: [name], MD;  Location: [facility];  Service: Endoscopy;  Laterality: N/A;   HAND TENDON SURGERY Bilateral    IR IMAGING GUIDED PORT INSERTION  04/26/2021   IR PARACENTESIS  04/24/2021   LAPAROSCOPY N/A 04/30/2021   Procedure: LAPAROSCOPY DIAGNOSTIC WITH BIOPSIES;  Surgeon: Lafonda Mosses, MD;  Location: [facility];  Service: Gynecology;  Laterality: N/A;   PARTIAL HYSTERECTOMY     Social History:  reports that she has never smoked. She has never used smokeless tobacco. She reports current alcohol use. She reports that she does not use drugs.  No Known Allergies  History reviewed. No pertinent family history.  Prior to Admission medications   Medication Sig Start Date End Date Taking? Authorizing Provider  acetaminophen-codeine (TYLENOL #3) 300-30 MG tablet Take 1 tablet by mouth every 6 (six) hours as needed for moderate pain. 05/10/22   [name], MD  amLODipine (NORVASC) 5 MG tablet Take 1 tablet (5 mg total) by mouth daily. 05/10/22   [name], MD  aspirin EC 81 MG tablet Take 81 mg by mouth daily. Swallow whole.    [provider]  Cholecalciferol (VITAMIN D3) 50 MCG (2000 UT) capsule Take 2,000 Units by mouth daily.    [provider]  Cyanocobalamin (VITAMIN B-12 PO) Take 1 capsule by mouth daily.    [provider]  ELIQUIS 5 MG TABS tablet TAKE 1 TABLET BY MOUTH TWICE DAILY 04/29/22   [name], MD  furosemide (LASIX) 20 MG tablet TAKE 1 TABLET BY MOUTH ONCE DAILY ON MONDAY, WEDNESDAY, AND FRIDAY. TAKE WITH POTASSIUM 05/13/22   [name], MD  ondansetron (ZOFRAN-ODT) 4 MG disintegrating tablet Take 1  tablet (4 mg total) by mouth daily as needed for nausea or vomiting. 05/14/22   [name], MD  potassium chloride SA (KLOR-CON M20) 20 MEQ tablet TAKE 1 TABLET BY MOUTH ONCE DAILY ON MONDAY, [place], AND FRIDAY. TAKE WITH LASIX Patient taking differently: Take 20 mEq by mouth See admin instructions. Take 20 mEq once daily on Monday, Wednesday, and Friday. 03/13/22   [name], MD  prochlorperazine (COMPAZINE) 10 MG tablet Take 1 tablet (10 mg total) by mouth every 6 (six) hours as needed for nausea or vomiting. Patient taking differently: Take 10 mg by mouth daily as needed for nausea or vomiting. 06/04/21   [name], MD  promethazine (PHENERGAN) 25 MG suppository Place 1 suppository (25 mg total) rectally every 6 (six) hours as needed for nausea or vomiting. 05/10/22   [name], MD  sennosides-docusate sodium (SENOKOT-S) 8.6-50 MG tablet Take 2 tablets by mouth daily as needed for constipation.    [provider]  vitamin C (ASCORBIC ACID) 250 MG tablet Take 250 mg by mouth daily.    [provider]  gabapentin (NEURONTIN) 100 MG capsule Take 1 capsule (100 mg total) by mouth at bedtime. Patient not taking: Reported on 02/27/2021 10/16/16 04/30/21  Lysbeth Penner, FNP    Physical Exam: Vitals:   2022-05-31 1358 05/31/2022 1400 31-May-2022 1415 05/31/22 1600  BP:  1'13/83 99/73 98/82 '$  Pulse: (!) 127 (!) 126 (!) 126 (!) 144  Resp: (!) 48 (!) 35 (!) 30 (!) 33  Temp: 98.8 F (37.1 C)   98.7 F (37.1 C)  TempSrc: Oral   Oral  SpO2: 94% 96% 95% 92%  Weight:    81.8 kg  Height:    '5\' 7"'$  (1.702 m)   Physical Exam Vitals and nursing note reviewed.  Constitutional:      Comments: Chronically ill-appearing.  Appears uncomfortable  HENT:     Head: Normocephalic and atraumatic.     Mouth/Throat:     Comments: Dry Cardiovascular:     Rate and Rhythm: Tachycardia present.  Pulmonary:     Effort: Respiratory distress present.     Comments: Tachypneic.  Coarse breath sounds in all lung  fields Abdominal:     General: Bowel sounds are decreased. There is distension.     Tenderness: There is generalized abdominal tenderness.  Skin:    General: Skin is warm and dry.  Neurological:     Motor: Weakness present.  Psychiatric:        Mood and Affect: Mood normal.        Behavior: Behavior normal.     Data Reviewed: Relevant notes from primary care and specialist visits, past discharge summaries as available in EHR, including Care Everywhere. Prior diagnostic testing as pertinent to current admission diagnoses Updated medications and problem lists for reconciliation ED course, including vitals, labs, imaging, treatment and response to treatment Triage notes, nursing and pharmacy notes and ED provider's notes Notable results as noted in HPI Labs reviewed.  Lactic acid 2.5, white count 7.3, hemoglobin 10.0, hematocrit 29.7, MCV 106.8, platelet count 32 (  ANC 21), sodium 143, potassium 3.7, chloride 105, bicarb 27, glucose 249, BUN 65, creatinine 1.64 above a baseline of 0.98, calcium 8.3, total protein 7.5, albumin 2.5, AST 14, ALT 12, alkaline phosphatase 62, total bilirubin 0.7, lipase 23 Chest x-ray shows no acute findings There are no new results to review at this time.  Assessment and Plan: * Sepsis ([place]) As evidenced by tachycardia, tachypnea, lactic acidosis and severe neutropenia with an [place] of 21. Source of sepsis appears to be most likely GI from patient's known malignant small bowel obstruction and worsening abdominal pain with refractory nausea and vomiting versus possible aspiration from refractory nausea and vomiting. Patient has been coughing up feculent material Aggressive IV fluid resuscitation Follow-up results of CT scan of abdomen and pelvis unavailable at the time of this H&P Place patient empirically on cefepime and Flagyl for anaerobic coverage Palliative care consult  AKI (acute kidney injury) ([place]) Secondary to dehydration from refractory nausea and  vomiting and poor oral intake Patient has a baseline serum creatinine of 0.98 but today on admission it is 1.69. Aggressive IV fluid resuscitation Repeat renal parameters  Small bowel obstruction ([place]) Patient with a history of signet ring adenocarcinoma with peritoneal carcinomatosis who now has a malignant obstruction related to peritoneal cancer progression with associated ascites. Seen by surgery during her last hospitalization and is not a surgical candidate. 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  She was then placed on maintenance Xeloda but recently resumed chemotherapy with her first treatment on 05/16/22. We will consult oncology  Antineoplastic chemotherapy induced pancytopenia (CODE) ([place]) Patient noted to have pancytopenia following recent chemotherapy. She has an [place] of 60 Discussed with oncology who does not recommend granulocyte colony-stimulating factor at this time Monitor closely for bleeding from thrombocytopenia  History of pulmonary embolus ([place]) Hold Eliquis due to thrombocytopenia with increased risk for bleeding      Advance Care Planning:   Code Status: DNR   Consults: Palliative care, oncology  Family Communication: Greater than 50% of time was spent discussing patient's condition and plan of care with her and her daughter at the bedside.  All questions and concerns have been addressed.  They verbalized understanding and agree with the plan.  Severity of Illness: The appropriate patient status for this patient is INPATIENT. Inpatient status is judged to be reasonable and necessary in order to provide the required intensity of service to ensure the patient's safety. The  patient's presenting symptoms, physical exam findings, and initial radiographic and laboratory data in the context of their chronic comorbidities is felt to place them at high risk for further clinical deterioration. Furthermore, it is not anticipated that the patient will be medically stable for discharge from the hospital within 2 midnights of admission.   * I certify that at the point of admission it is my clinical judgment that the patient will require inpatient hospital care spanning beyond 2 midnights from the point of admission due to high intensity of service, high risk for further deterioration and high frequency of surveillance required.*  Author: Collier Bullock, MD 06/10/22 4:26 PM  For on call review [email or web address].

## 2022-05-24 NOTE — Assessment & Plan Note (Signed)
Hold Eliquis due to thrombocytopenia with increased risk for bleeding

## 2022-05-24 NOTE — ED Notes (Signed)
Provider and nurse aware critical lactic 2.5.

## 2022-05-24 NOTE — Assessment & Plan Note (Signed)
Patient with a history of signet ring adenocarcinoma with peritoneal carcinomatosis who now has a malignant obstruction related to peritoneal cancer progression with associated ascites. Seen by surgery during her last hospitalization and is not a surgical candidate. We will consult palliative care

## 2022-05-24 NOTE — Assessment & Plan Note (Signed)
Secondary to dehydration from refractory nausea and vomiting and poor oral intake Patient has a baseline serum creatinine of 0.98 but today on admission it is 1.69. Aggressive IV fluid resuscitation Repeat renal parameters

## 2022-05-24 NOTE — Assessment & Plan Note (Signed)
Patient noted to have pancytopenia following recent chemotherapy. She has an [place] of 21 Discussed with oncology who does not recommend granulocyte colony-stimulating factor at this time Monitor closely for bleeding from thrombocytopenia

## 2022-05-24 NOTE — Progress Notes (Signed)
Patient arrived to unit around 1600. Patient's HR in 140's, and RR greater than 30/min. Patient continuously coughing up feculent/bloody material into [place]. PRN Zofran and morphine administered by this RN. No change in status. Dr Francine Graven notified by this RN, and orders received to insert NG tube for LIS. Attempt failed d/t ongoing coughing with increased distress of patient's respiratory status. Palliative care unavailable to discuss [place] until AM. Dr Francine Graven notified of patient distress at 1630, and asked to re-assess patient at bedside and discuss [place] with patient and her daughter. Currently awaiting MD arrival.

## 2022-05-24 NOTE — Telephone Encounter (Signed)
Spoke with pt's daughter to inform her that the pt is neutropenic.  Instructed pt's daughter to take the pt to the ER per Natasha Bence, NP request.  Instructed pt's daughter to bring pt to [facility] ED and this RN will contact the ED to make them aware of the pt's symptoms. Pt's daughter verbalized understanding of instructions.  Spoke with CN of [facility] ED to give report on pt's symptoms.  CN is aware and will get pt in a room upon arrival.  CN requested copy of labs when Dr. Ernestina Penna office receive them from pt's home health agency.

## 2022-05-24 NOTE — Progress Notes (Signed)
A consult was received from an ED physician for cefepime per pharmacy dosing.  The patient's profile has been reviewed for ht/wt/allergies/indication/available labs.   A one time order has been placed for cefepime 2g.  Further antibiotics/pharmacy consults should be ordered by admitting physician if indicated.                       Thank you, Kara Mead 06/10/22  1:18 PM

## 2022-05-24 NOTE — Assessment & Plan Note (Signed)
Secondary to refractory emesis from malignant bowel obstruction. Patient has had infiltrates in both lower lung zones Patient was started on cefepime and Flagyl

## 2022-05-24 DEATH — deceased

## 2022-05-28 ENCOUNTER — Other Ambulatory Visit: Payer: TRICARE For Life (TFL)

## 2022-05-28 ENCOUNTER — Ambulatory Visit: Payer: TRICARE For Life (TFL)

## 2022-05-28 ENCOUNTER — Ambulatory Visit: Payer: TRICARE For Life (TFL) | Admitting: Hematology

## 2022-05-28 ENCOUNTER — Ambulatory Visit: Payer: TRICARE For Life (TFL) | Admitting: Nurse Practitioner

## 2022-05-28 ENCOUNTER — Encounter: Payer: TRICARE For Life (TFL) | Admitting: Dietician

## 2022-05-29 ENCOUNTER — Other Ambulatory Visit: Payer: Self-pay

## 2022-08-26 IMAGING — DX DG ABD PORTABLE 1V
1 series · 1 of 1 positions shown · non-contrast
Comparison: CT yesterday.

CLINICAL DATA: Small bowel obstruction, 8 hour film.

EXAM:
PORTABLE ABDOMEN - 1 VIEW

[abdomen]
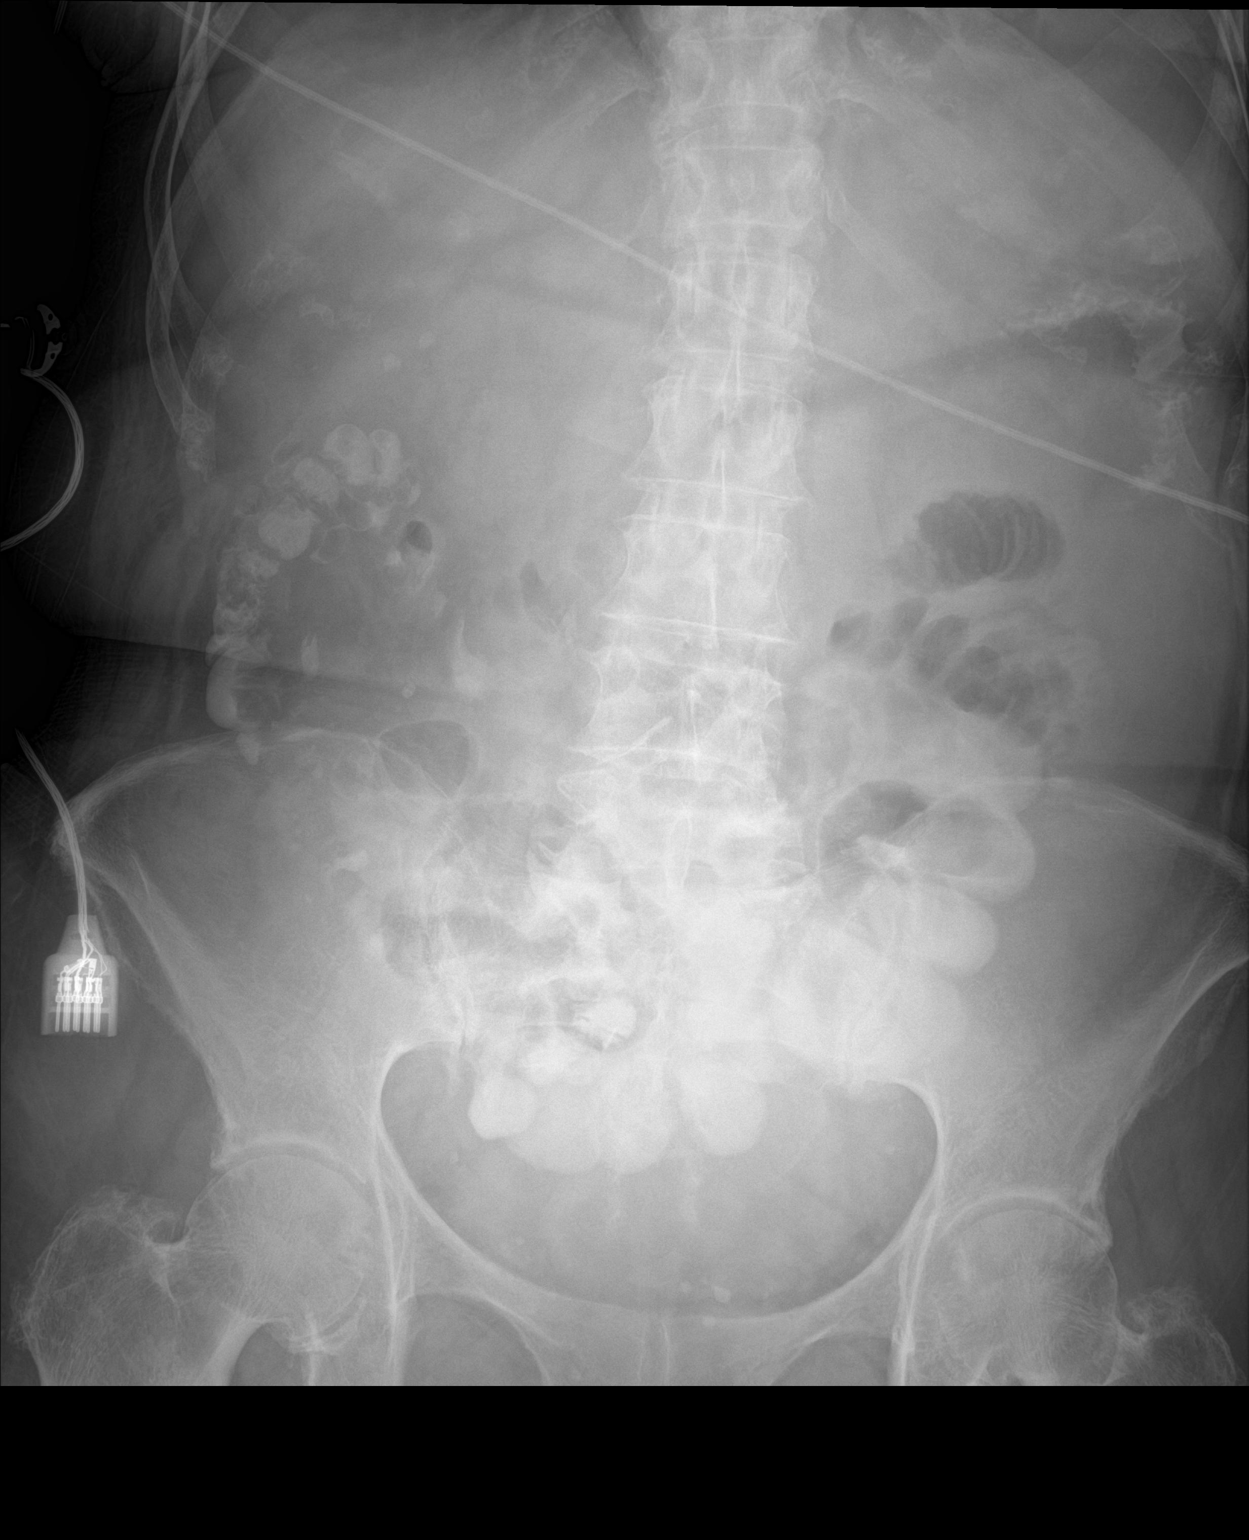

[1 of 1 positions shown; findings below may reference images not displayed]

FINDINGS: Administered enteric contrast has reached the ascending and proximal
transverse colon. There is enteric contrast within prominent small
bowel in the central abdomen. Left upper quadrant contrast may be
residual within small bowel or at the splenic flexure. No evidence
of free air.
IMPRESSION: Administered enteric contrast has reached the ascending and proximal
transverse colon. There is enteric contrast within prominent small
bowel in the central abdomen. Left upper quadrant contrast may be
residual within small bowel or at the splenic flexure.

## 2022-08-26 IMAGING — US US ABDOMEN LIMITED
1 series · 10 of 10 positions shown · non-contrast
Comparison: 04/21/2021 CT

CLINICAL DATA: Peritoneal neoplasm abdominal distension, recurrent
ascites

EXAM:
LIMITED ABDOMEN ULTRASOUND FOR ASCITES
TECHNIQUE: Limited ultrasound survey for ascites was performed in all four
abdominal quadrants.

[Series 1: us paracentesis · 10 of 10 slices shown]
[im 1/10]
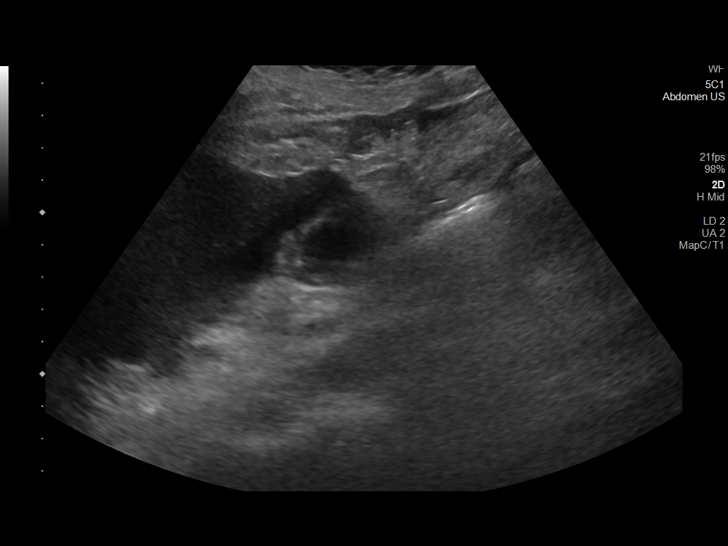
[im 2/10]
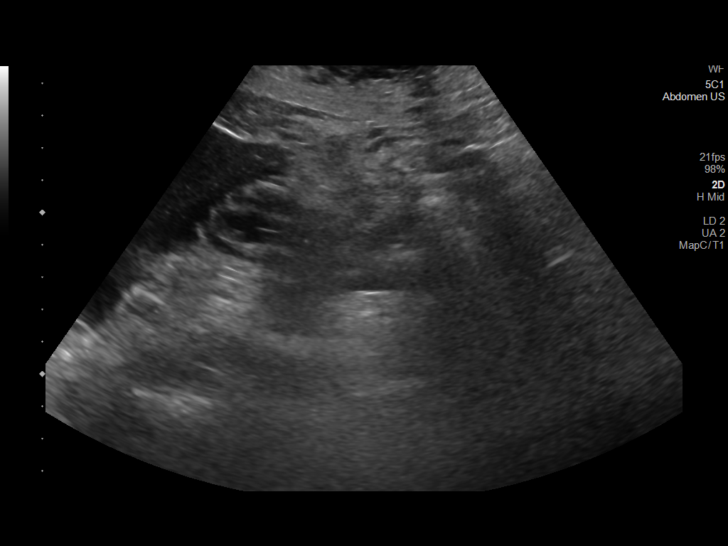
[im 3/10]
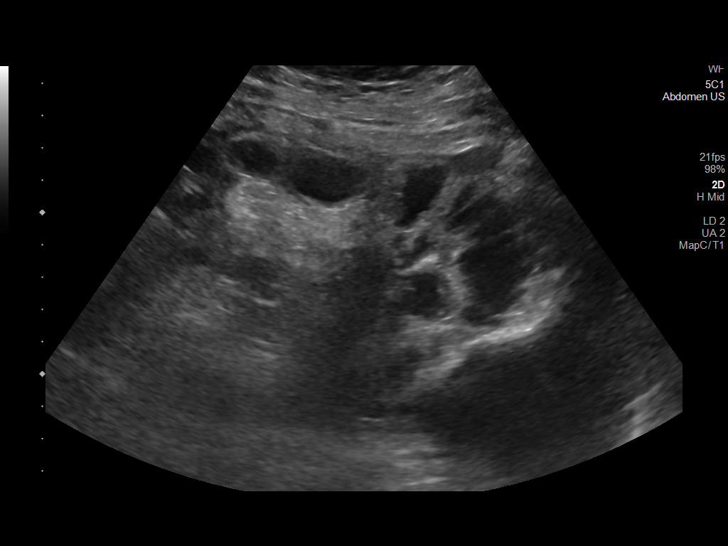
[im 4/10]
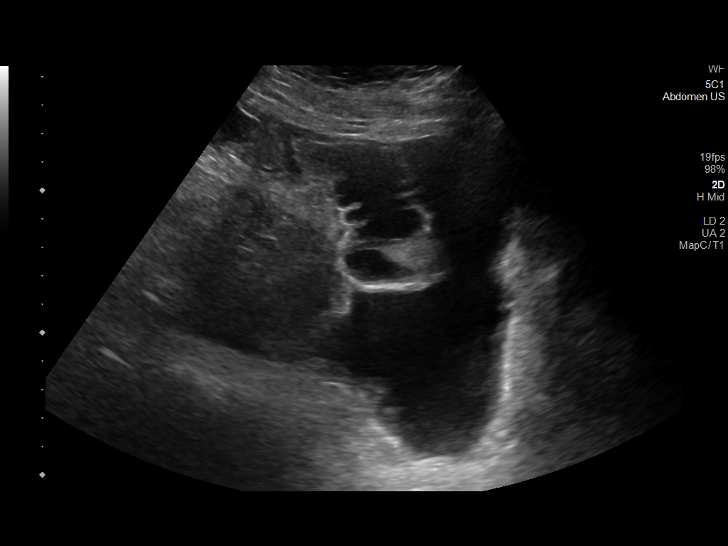
[im 5/10]
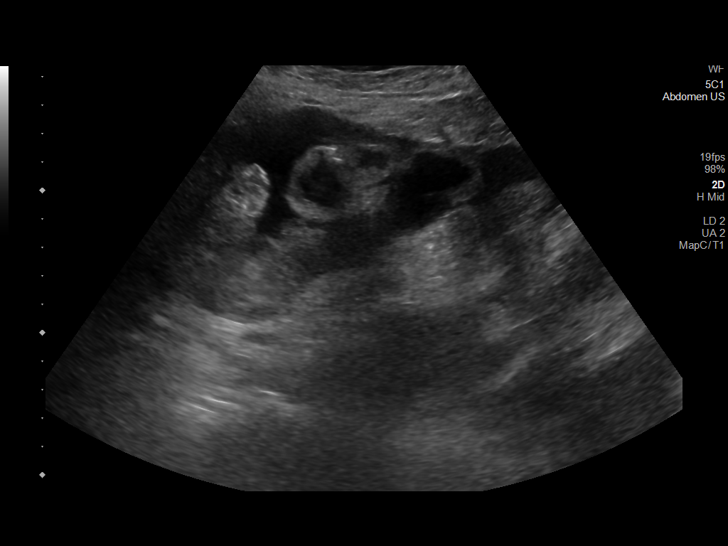
[im 6/10]
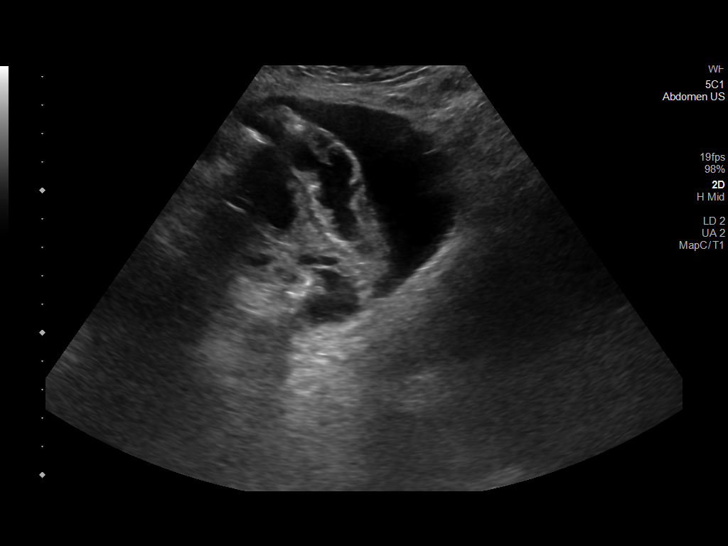
[im 7/10]
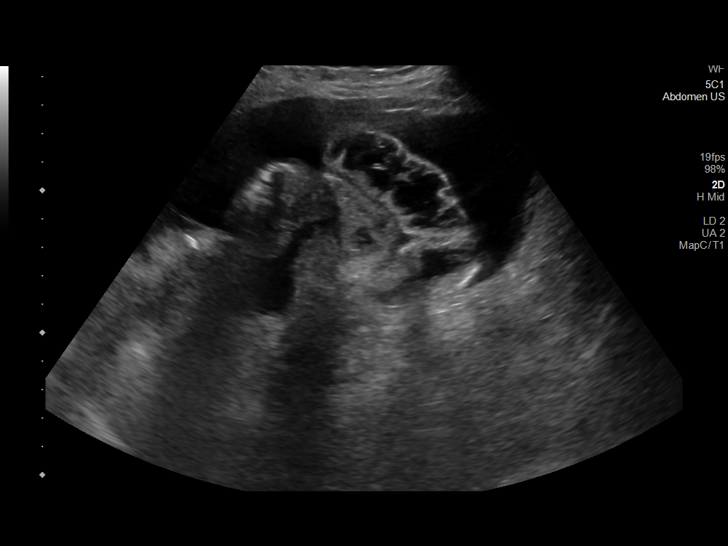
[im 8/10]
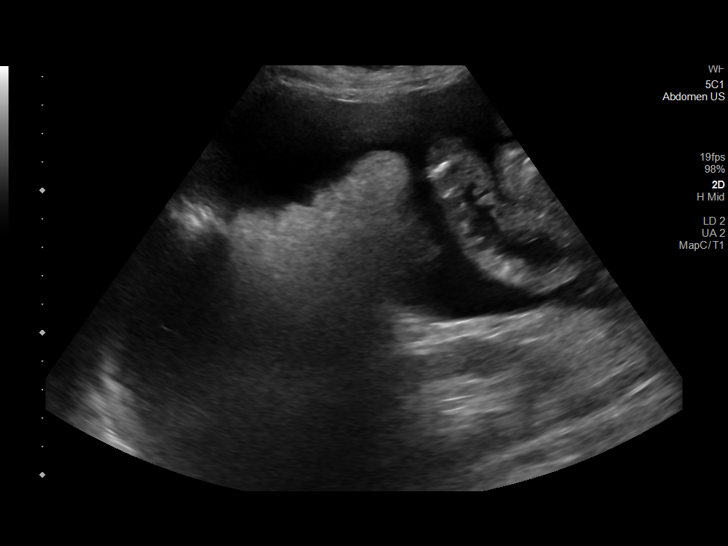
[im 9/10]
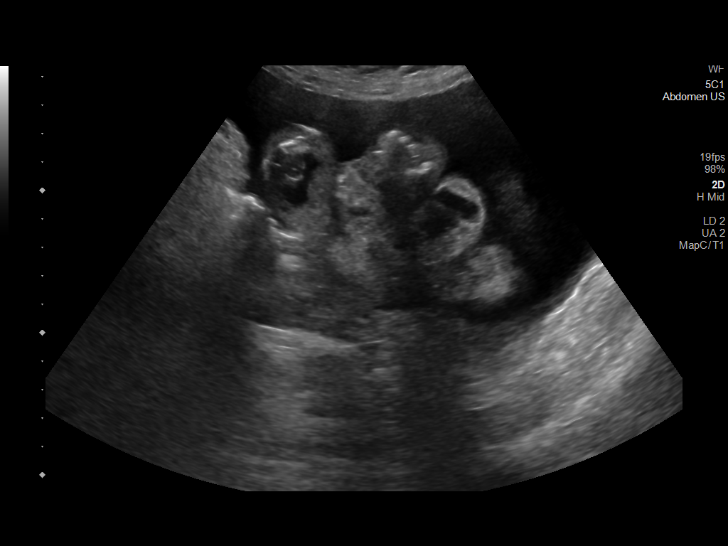
[im 10/10]
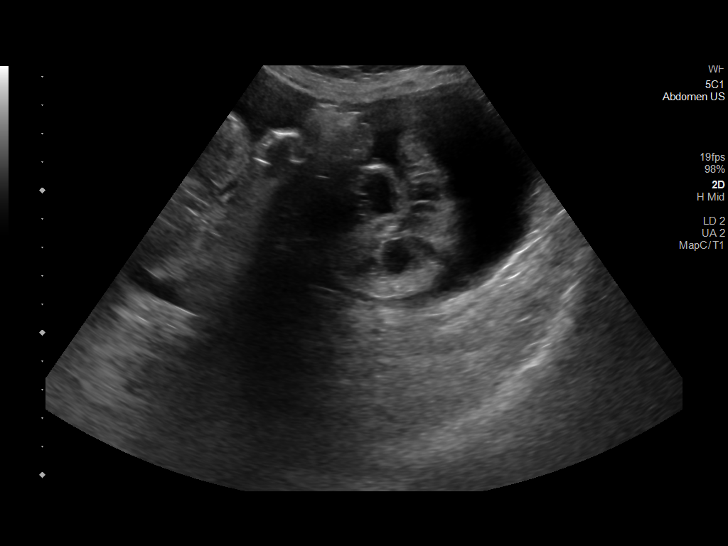

[10 of 10 positions shown; findings below may reference images not displayed]

FINDINGS: Survey of the abdominal 4 quadrants demonstrates a small volume of
lower abdominal ascites by ultrasound.

Unable to perform paracentesis because of intractable nausea
vomiting today.
IMPRESSION: Small volume of lower abdominopelvic ascites.

Will reassess for paracentesis tomorrow 04/23/2021.

## 2022-08-30 IMAGING — DX DG ABDOMEN 2V
3 series · 3 of 3 positions shown · non-contrast
Comparison: A[DATE]

CLINICAL DATA: Follow-up small bowel obstruction.

EXAM:
ABDOMEN - 2 VIEW

[abdomen erect]
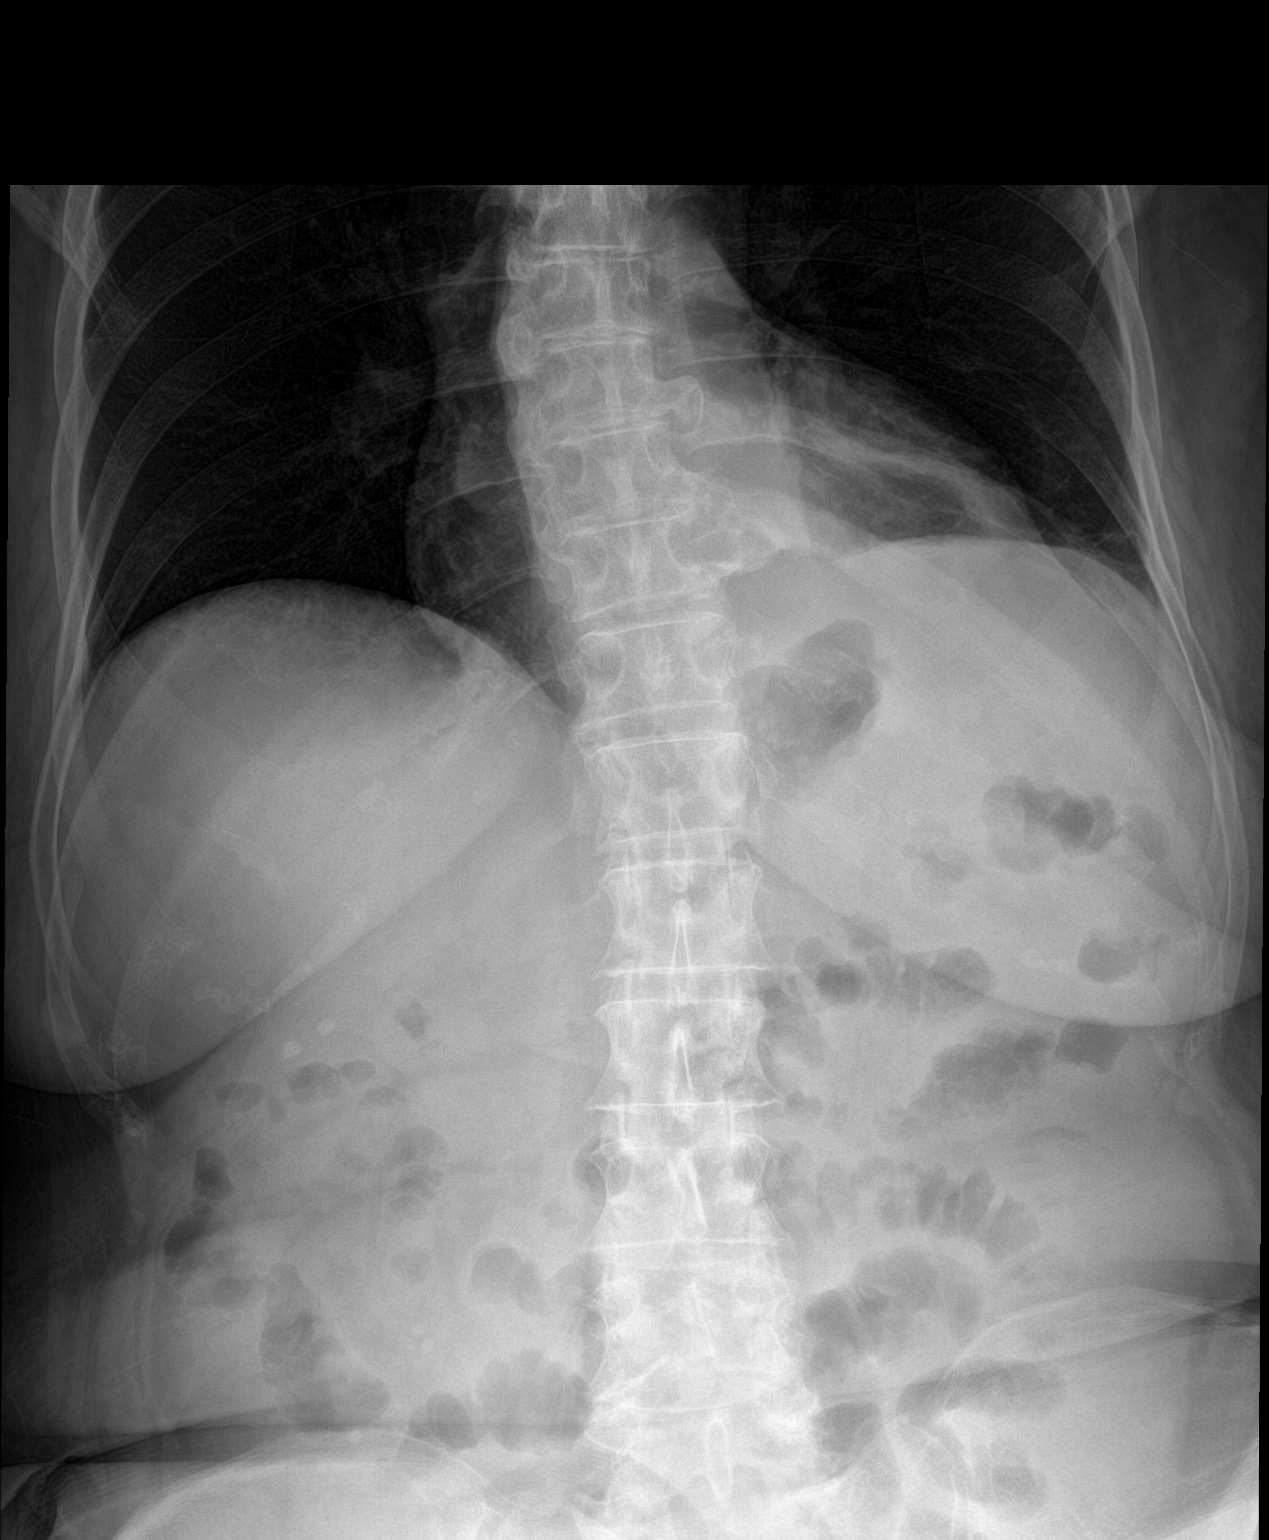

[abdomen supine (1 of 2)]
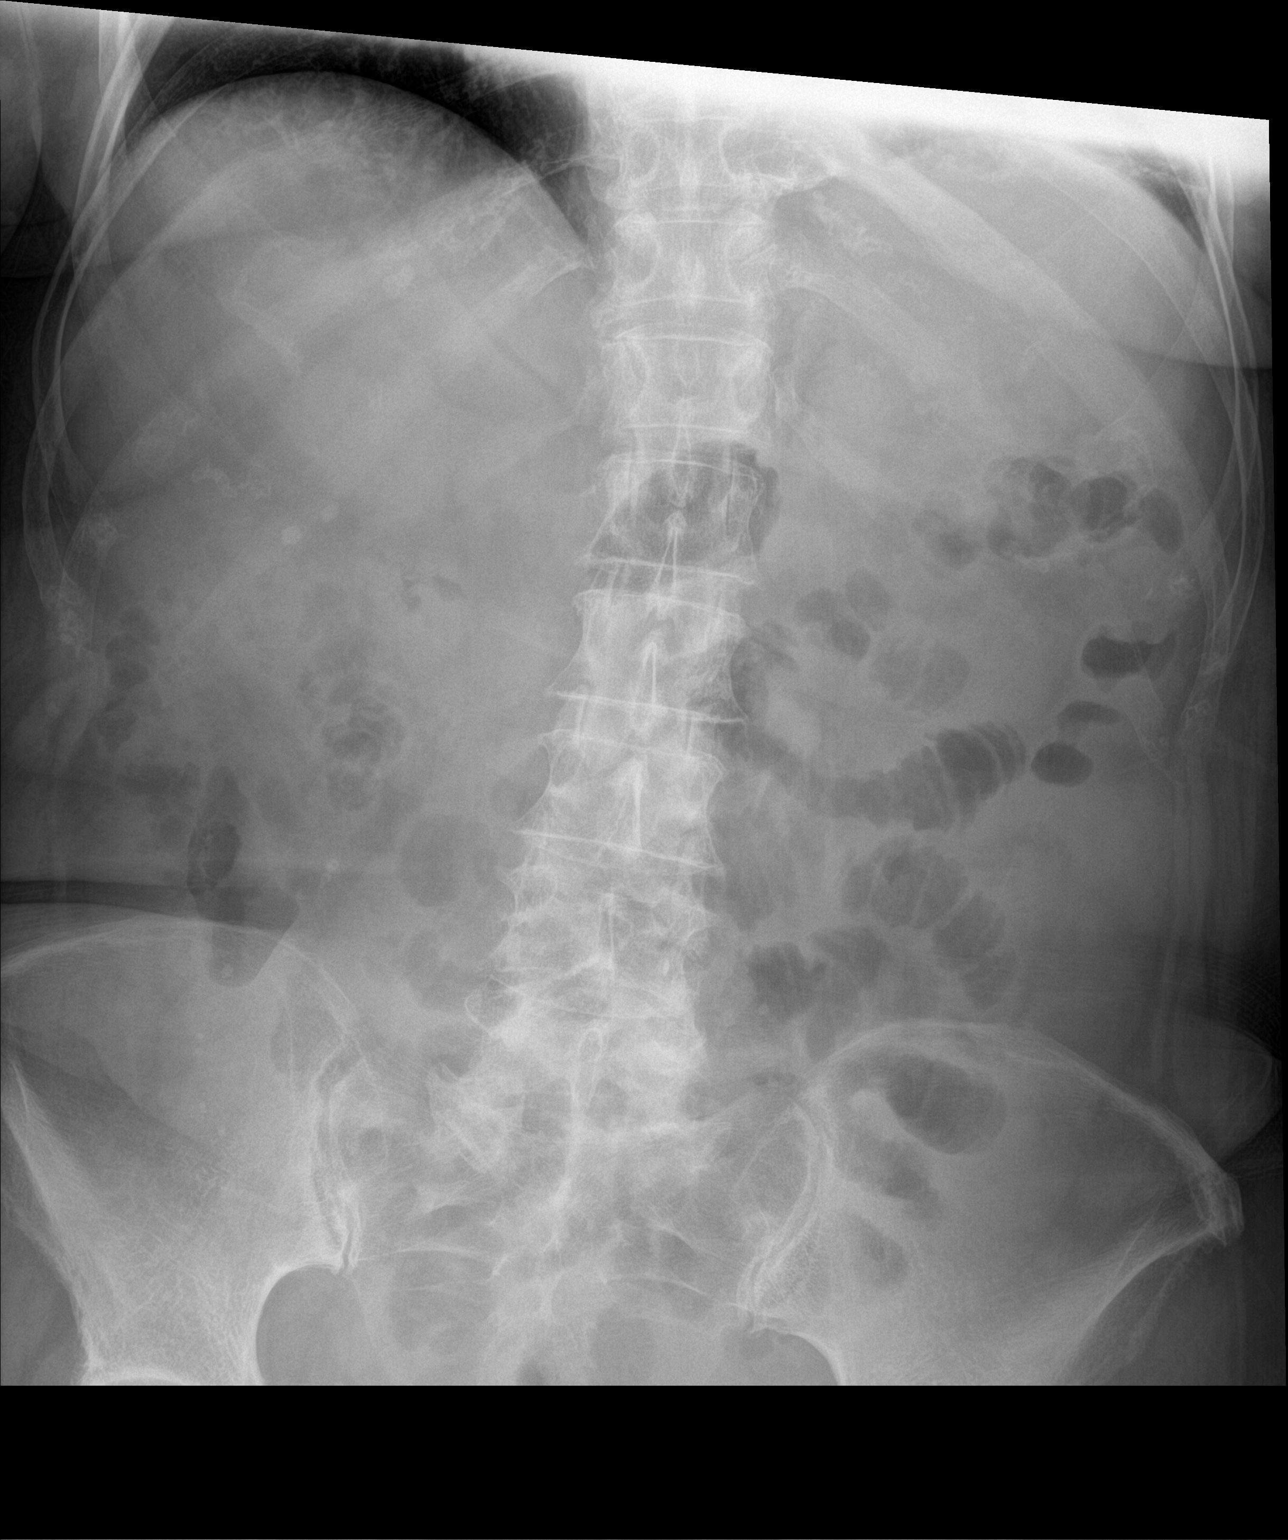

[abdomen supine (2 of 2)]
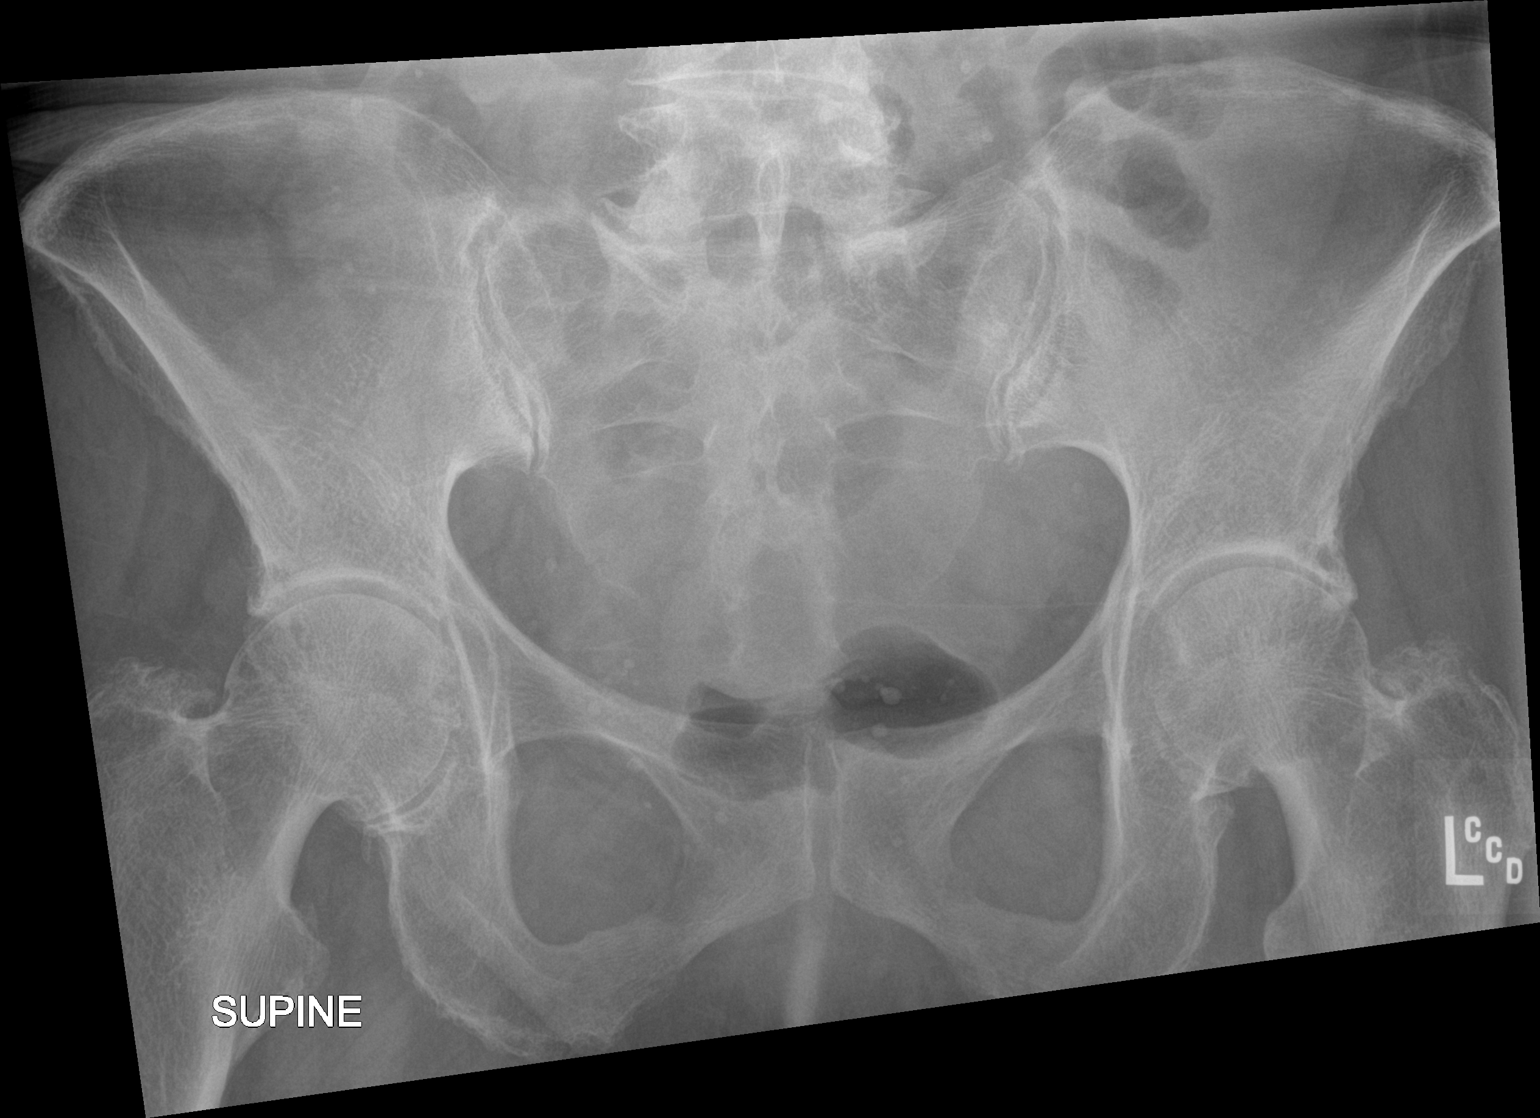

[3 of 3 positions shown; findings below may reference images not displayed]

FINDINGS: There is mild residual gaseous distension of multiple small bowel
loops, decreased from prior. A small amount of gas is scattered in
the colon and rectum. No intraperitoneal free air is identified.
Atelectasis is noted in the left lung base.
IMPRESSION: Decreased small bowel distension compatible with improving
obstruction.

## 2023-01-23 ENCOUNTER — Other Ambulatory Visit (HOSPITAL_COMMUNITY): Payer: Self-pay

## 2023-03-11 ENCOUNTER — Other Ambulatory Visit (HOSPITAL_BASED_OUTPATIENT_CLINIC_OR_DEPARTMENT_OTHER): Payer: Self-pay
